# Patient Record
Sex: Male | Born: 1935
Health system: Southern US, Community
[De-identification: ages and names within clinical notes are randomized; demographics above are authoritative.]

## PROBLEM LIST (undated history)

## (undated) DIAGNOSIS — Z950 Presence of cardiac pacemaker: Secondary | ICD-10-CM

## (undated) DIAGNOSIS — I071 Rheumatic tricuspid insufficiency: Secondary | ICD-10-CM

## (undated) DIAGNOSIS — I5020 Unspecified systolic (congestive) heart failure: Secondary | ICD-10-CM

## (undated) DIAGNOSIS — I4729 Other ventricular tachycardia: Secondary | ICD-10-CM

## (undated) DIAGNOSIS — G4733 Obstructive sleep apnea (adult) (pediatric): Secondary | ICD-10-CM

## (undated) DIAGNOSIS — G7000 Myasthenia gravis without (acute) exacerbation: Secondary | ICD-10-CM

## (undated) DIAGNOSIS — D509 Iron deficiency anemia, unspecified: Secondary | ICD-10-CM

## (undated) DIAGNOSIS — C439 Malignant melanoma of skin, unspecified: Secondary | ICD-10-CM

## (undated) DIAGNOSIS — Z87442 Personal history of urinary calculi: Secondary | ICD-10-CM

## (undated) DIAGNOSIS — I351 Nonrheumatic aortic (valve) insufficiency: Secondary | ICD-10-CM

## (undated) DIAGNOSIS — I77810 Thoracic aortic ectasia: Secondary | ICD-10-CM

## (undated) DIAGNOSIS — Z8679 Personal history of other diseases of the circulatory system: Secondary | ICD-10-CM

## (undated) DIAGNOSIS — E785 Hyperlipidemia, unspecified: Secondary | ICD-10-CM

## (undated) DIAGNOSIS — I447 Left bundle-branch block, unspecified: Secondary | ICD-10-CM

## (undated) DIAGNOSIS — E119 Type 2 diabetes mellitus without complications: Secondary | ICD-10-CM

## (undated) DIAGNOSIS — I10 Essential (primary) hypertension: Secondary | ICD-10-CM

## (undated) DIAGNOSIS — K922 Gastrointestinal hemorrhage, unspecified: Secondary | ICD-10-CM

## (undated) DIAGNOSIS — C801 Malignant (primary) neoplasm, unspecified: Secondary | ICD-10-CM

## (undated) DIAGNOSIS — M4800 Spinal stenosis, site unspecified: Secondary | ICD-10-CM

## (undated) DIAGNOSIS — R2689 Other abnormalities of gait and mobility: Secondary | ICD-10-CM

## (undated) DIAGNOSIS — I2510 Atherosclerotic heart disease of native coronary artery without angina pectoris: Secondary | ICD-10-CM

## (undated) DIAGNOSIS — I472 Ventricular tachycardia: Secondary | ICD-10-CM

## (undated) HISTORY — PX: TONSILLECTOMY: SUR1361

## (undated) HISTORY — DX: Hyperlipidemia, unspecified: E78.5

## (undated) HISTORY — DX: Left bundle-branch block, unspecified: I44.7

## (undated) HISTORY — PX: COLONOSCOPY: SHX174

## (undated) HISTORY — DX: Malignant (primary) neoplasm, unspecified: C80.1

## (undated) HISTORY — DX: Essential (primary) hypertension: I10

## (undated) HISTORY — DX: Myasthenia gravis without (acute) exacerbation: G70.00

## (undated) HISTORY — DX: Atherosclerotic heart disease of native coronary artery without angina pectoris: I25.10

---

## 1999-01-07 ENCOUNTER — Ambulatory Visit: Admission: RE | Admit: 1999-01-07 | Discharge: 1999-01-07 | Payer: Self-pay | Admitting: Internal Medicine

## 1999-02-08 ENCOUNTER — Encounter (INDEPENDENT_AMBULATORY_CARE_PROVIDER_SITE_OTHER): Payer: Self-pay | Admitting: Specialist

## 1999-02-08 ENCOUNTER — Ambulatory Visit (HOSPITAL_COMMUNITY): Admission: RE | Admit: 1999-02-08 | Discharge: 1999-02-08 | Payer: Self-pay | Admitting: *Deleted

## 1999-03-14 ENCOUNTER — Ambulatory Visit: Admission: RE | Admit: 1999-03-14 | Discharge: 1999-03-14 | Payer: Self-pay | Admitting: Internal Medicine

## 2002-03-01 ENCOUNTER — Ambulatory Visit (HOSPITAL_COMMUNITY): Admission: RE | Admit: 2002-03-01 | Discharge: 2002-03-02 | Payer: Self-pay | Admitting: Cardiology

## 2002-03-01 HISTORY — PX: CORONARY ANGIOPLASTY WITH STENT PLACEMENT: SHX49

## 2002-07-14 ENCOUNTER — Encounter (INDEPENDENT_AMBULATORY_CARE_PROVIDER_SITE_OTHER): Payer: Self-pay | Admitting: *Deleted

## 2002-07-14 ENCOUNTER — Ambulatory Visit (HOSPITAL_COMMUNITY): Admission: RE | Admit: 2002-07-14 | Discharge: 2002-07-14 | Payer: Self-pay | Admitting: *Deleted

## 2003-05-01 ENCOUNTER — Ambulatory Visit (HOSPITAL_BASED_OUTPATIENT_CLINIC_OR_DEPARTMENT_OTHER): Admission: RE | Admit: 2003-05-01 | Discharge: 2003-05-01 | Payer: Self-pay | Admitting: Otolaryngology

## 2004-09-16 ENCOUNTER — Ambulatory Visit (HOSPITAL_COMMUNITY): Admission: RE | Admit: 2004-09-16 | Discharge: 2004-09-16 | Payer: Self-pay | Admitting: *Deleted

## 2005-03-07 ENCOUNTER — Encounter: Admission: RE | Admit: 2005-03-07 | Discharge: 2005-03-07 | Payer: Self-pay | Admitting: Internal Medicine

## 2009-04-19 ENCOUNTER — Encounter: Admission: RE | Admit: 2009-04-19 | Discharge: 2009-04-19 | Payer: Self-pay | Admitting: Internal Medicine

## 2010-08-19 ENCOUNTER — Encounter: Payer: Self-pay | Admitting: Internal Medicine

## 2010-11-27 HISTORY — PX: NM MYOCAR PERF WALL MOTION: HXRAD629

## 2010-12-05 HISTORY — PX: US ECHOCARDIOGRAPHY: HXRAD669

## 2010-12-13 NOTE — Op Note (Signed)
   NAME:  David, [name]                       ACCOUNT NO.:  [id number]   MEDICAL RECORD NO.:  [id number]                   PATIENT TYPE:  AMB   LOCATION:  ENDO                                 FACILITY:  [facility]   PHYSICIAN:  Georgiana Spinner, M.D.                 DATE OF BIRTH:  06-May-1936   DATE OF PROCEDURE:  07/14/2002  DATE OF DISCHARGE:                                 OPERATIVE REPORT   PROCEDURE:  Upper endoscopy.   INDICATIONS:  GERD.   ANESTHESIA:  Demerol 50 mg, Versed 5 mg.   DESCRIPTION OF PROCEDURE:  With the patient mildly sedated in the left  lateral decubitus position, the Olympus videoscopic endoscope was inserted  in the mouth, passed under direct vision through the esophagus, which  appeared normal, into the stomach.  Fundus, body, antrum, duodenal bulb,  second portion of the duodenum all appeared normal.  From this point the  endoscope was slowly withdrawn, taking circumferential views of the entire  duodenal mucosa until the endoscope pulled back into the stomach, placed in  retroflexion to view the stomach from below, and markedly decreased laxity  of the GE junction could was seen.  We could see up the esophagus in  retroflex view.  The endoscope was then straightened and withdrawn, taking  circumferential views of the remaining gastric and esophageal mucosa.  The  patient's vital signs and pulse oximetry remained stable.  The patient  tolerated the procedure well without apparent complications.   FINDINGS:  Incomplete wrap of the gastroesophageal junction around the  endoscope, which presumably leads to reflux; otherwise an unremarkable  examination.   PLAN:  Proceed to colonoscopy as planned.                                                Georgiana Spinner, M.D.    GMO/MEDQ  D:  07/14/2002  T:  07/15/2002  Job:  [id number]

## 2010-12-13 NOTE — Cardiovascular Report (Signed)
NAME:  David [name], David [name]                       ACCOUNT NO.:  [id number]   MEDICAL RECORD NO.:  [id number]                   PATIENT TYPE:  OIB   LOCATION:  2899                                 FACILITY:  [facility]   PHYSICIAN:  Aram Candela. Tysinger, M.D.              DATE OF BIRTH:  10/05/1935   DATE OF PROCEDURE:  03/01/2002  DATE OF DISCHARGE:                              CARDIAC CATHETERIZATION   PROCEDURES:  1. Left heart catheterization.  2. Coronary cineangiography.  3. Left internal mammary artery cineangiography.  4. Intravascular ultrasound of the left anterior descending.  5. Left ventricular cineangiography.  6. Angioplasty of primary stenting of the left anterior descending.  7. Perclose of the right femoral artery.   INDICATIONS FOR PROCEDURE:  This 75 year old male had the recent change in  his electrocardiogram to a left bundle branch block.  Follow-up  echocardiogram showed malfunction of the septal segment of his left  ventricle.  With evidence of left ventricular segmental dysfunction and  recent change in EKG with left bundle branch block, he was then scheduled  for cardiac catheterization in case of the high probability of ischemic  heart disease.   DESCRIPTION OF PROCEDURE:  After signing an informed consent, the patient  was premedicated with 50 mg of Benadryl intravenously and brought to the  cardiac catheterization lab.  His right groin was prepped and draped in a  sterile fashion and anesthetized locally with 1% lidocaine.  A 6 French  introducer sheath was inserted percutaneously into the right femoral artery.  A 6 French #4 Judkins coronary catheter was used to make injections into the  native coronary arteries.  The right coronary artery was used to make an  injection into the left subclavian artery, visualizing the left internal  mammary artery.  A 6 French pigtail catheter was used to measure pressures  in the left ventricle and aorta and to make midstream  injections into the  left ventricle and abdominal aorta.  An IVUS catheter was used with a high-  torque floppy guide wire to perform an intravascular ultrasound of the LAD.  A 6 [place] JL5 guide catheter was used for the IVUS procedure and also for  the angioplasty procedure where a high-torque floppy guide wire was used to  cross the lesion and a Cipher RX stent, size 3.0 x 18 mm, was used to  primarily stent the mid LAD lesion.  The stent was deployed with one  inflation at 20 atmospheres for 87 seconds.  At the end of the procedure,  the catheter and sheath were removed from the right femoral artery and  hemostasis was easily obtained with a Perclose closure system.   MEDICATIONS GIVEN:  1. Versed 1 mg IV.  2. Heparin a total of 6000 units IV.  3. Integrilin drip per pharmacy protocol.   HEMODYNAMIC DATA:  Left ventricular pressure 120/0-14.  Aortic pressure  121/72 with a mean of  90.  The left ventricular ejection fraction was  estimated at 50-60%.   CINEANGIOGRAPHIC FINDINGS:  Left coronary artery:  The ostium has a very  mild plaque causing a less than 20% stenosis.  The remainder of the left  main appears normal.  Left anterior descending:  The LAD has a plaque in the  middle segment, which is focal and eccentric and causes a 70-80% stenosis on  routine angiography.  The remainder of the LAD appeared normal.  The  circumflex coronary artery appeared normal.  Right coronary artery:  The  right coronary artery is a large dominant vessel supplying the posterior  descending and posterolateral circulation.  The ostium has a mild narrowing,  but this is followed by moderate ectasia and the ostium may be normal in  comparison.  The proximal segment does have moderate ectasia, which is  eccentric and causes slowing of the antegrade flow.  Otherwise the right  coronary artery appears normal without significant stenosis.  Left  ventricle:  The left ventricular chamber size is mildly  enlarged.  The  overall left ventricular contractility appears normal with an ejection  fraction of 50-60%.  The septum was not visualized in this RAO view, but  mitral and aortic valves appear normal.  Angioplasty:  Cines taken during  the IVUS and angioplasty procedures show proper positioning of the guide  wire, IVUS apparatus, and the balloon stent system.  Final injections into  the LAD post stent deployment showed an excellent angiographic result with  0% residual lesion and no evidence for dissection or clot.  There was normal  antegrade flow.   FINAL DIAGNOSES:  1. Single-vessel coronary artery disease with 80-90% stenosis of the mid     left anterior descending by intravascular ultrasound.  2. Moderate ectasia of the right coronary artery.  3. Normal left ventricular function.  4. Normal left internal mammary artery.  5. Successful primary stent placement of the mid left anterior descending.  6. Successful Perclose of the right femoral artery.   DISPOSITION:  Will monitor on the EAD and continue the Integrilin drip.  Will plan for discharge in the a.m.                                                John R. Tysinger, M.D.    JRT/MEDQ  D:  03/01/2002  T:  03/05/2002  Job:  [id number]   cc:   Jaclyn Prime. Lucas Mallow, M.D.  Fax: [phone]   Catherization Lab

## 2010-12-13 NOTE — Op Note (Signed)
NAMEMAVERYK, RENSTROM             ACCOUNT NO.:  [id number]   MEDICAL RECORD NO.:  [id number]          PATIENT TYPE:  AMB   LOCATION:  ENDO                         FACILITY:  [facility]   PHYSICIAN:  Georgiana Spinner, M.D.    DATE OF BIRTH:  [birth year]   DATE OF PROCEDURE:  09/16/2004  DATE OF DISCHARGE:                                 OPERATIVE REPORT   PROCEDURE:  Colonoscopy.   INDICATIONS:  Colon polyps.   ANESTHESIA:  Demerol 60, Versed 6 mg.   DESCRIPTION OF PROCEDURE:  With the patient mildly sedated in the left  lateral decubitus position, the Olympus videoscopic colonoscope was inserted  into the rectum after a normal rectal exam and passed under direct vision to  the cecum identified by ileocecal valve and appendiceal orifice both of  which were photographed.  From this point, the colonoscope was slowly  withdrawn taking circumferential views of colonic mucosa stopping only in  the rectum which appeared normal in direct view and showed hemorrhoids on  retroflexed view and a fissure through the anal canal. The patient's vital  signs and pulse oximeter remained stable. The patient tolerated procedure  well without apparent complications.   FINDINGS:  Internal hemorrhoids and fissure, otherwise unremarkable  examination.   PLAN:  Repeat examination possibly in 5 years      GMO/MEDQ  D:  09/16/2004  T:  09/16/2004  Job:  [id number]

## 2010-12-13 NOTE — Op Note (Signed)
   NAME:  David [name], David [name]                       ACCOUNT NO.:  [id number]   MEDICAL RECORD NO.:  [id number]                   PATIENT TYPE:  AMB   LOCATION:  ENDO                                 FACILITY:  [facility]   PHYSICIAN:  Georgiana Spinner, M.D.                 DATE OF BIRTH:  1935/09/26   DATE OF PROCEDURE:  07/14/2002  DATE OF DISCHARGE:                                 OPERATIVE REPORT   PROCEDURE:  Colonoscopy with polypectomy.   INDICATIONS:  Colon polyps.   ANESTHESIA:  Demerol 20 mg, Versed 2 mg.   DESCRIPTION OF PROCEDURE:  With the patient mildly sedated in the left  lateral decubitus position, the Olympus videoscopic colonoscope was inserted  in the rectum after a normal rectal exam, passed under direct vision to the  cecum, identified by ileocecal valve and appendiceal orifice, both of which  were photographed.  From this point the colonoscope was slowly withdrawn,  taking circumferential views of the entire colonic mucosa as we pulled back  all the way to the rectum, stopping at 25 cm from the anal verge, at which  point a small pedunculated polyp was seen, photographed, and removed using  snare cautery technique, setting of 20-20 blended current.  Tissue was  retrieved for pathology and the endoscope was withdrawn as noted, and  retroflexed view showed hemorrhoids.  The endoscope was straightened and  withdrawn.  The patient's vital signs and pulse oximetry remained stable.  The patient tolerated the procedure well without apparent complications.   FINDINGS:  1. Internal hemorrhoids.  2. Polyp at 25 cm.   PLAN:  Await biopsy report.  The patient will call me for results and follow  up with me as an outpatient.                                               Georgiana Spinner, M.D.    GMO/MEDQ  D:  07/14/2002  T:  07/15/2002  Job:  [id number]

## 2011-08-05 ENCOUNTER — Other Ambulatory Visit: Payer: Self-pay | Admitting: Dermatology

## 2011-12-09 ENCOUNTER — Other Ambulatory Visit: Payer: Self-pay | Admitting: Dermatology

## 2012-02-19 ENCOUNTER — Other Ambulatory Visit: Payer: Self-pay | Admitting: Dermatology

## 2012-03-25 ENCOUNTER — Ambulatory Visit (INDEPENDENT_AMBULATORY_CARE_PROVIDER_SITE_OTHER): Payer: Medicare Other | Admitting: General Surgery

## 2012-03-25 ENCOUNTER — Encounter (INDEPENDENT_AMBULATORY_CARE_PROVIDER_SITE_OTHER): Payer: Self-pay | Admitting: General Surgery

## 2012-03-25 VITALS — BP 136/74 | HR 92 | Temp 98.0°F | Resp 16 | Ht 69.0 in | Wt 212.2 lb

## 2012-03-25 DIAGNOSIS — N5089 Other specified disorders of the male genital organs: Secondary | ICD-10-CM

## 2012-03-25 DIAGNOSIS — N508 Other specified disorders of male genital organs: Secondary | ICD-10-CM

## 2012-03-25 NOTE — Patient Instructions (Signed)
You have a tender left spermatic cord mass and need to see Dr. Isabel Caprice.

## 2012-03-25 NOTE — Progress Notes (Signed)
Patient ID: David [name], male   DOB: 03-30-1936, 76 y.o.   MRN: [id number]  Chief Complaint  Patient presents with  . Pre-op Exam    eval ing hernia    HPI David [name] is a 76 y.o. male.   HPI   He is referred by Dr. Renne Crigler for evaluation of a left inguinal hernia.  He has had sporadic discomfort and a small swelling in the left scrotal area off and on for many months. It is not activity related. He says it will swell up and be sure for 2 weeks then then will resolve. No dysuria. It does not interfere with his bowel or bladder habits.  No family history of inguinal hernia.  Past Medical History  Diagnosis Date  . Hyperlipidemia   . Cancer     skin    Sleep apnea   HTN  Past Surgical History  Procedure Date  . Tonsillectomy     Family History  Problem Relation Age of Onset  . Cancer Mother     breast  . Cancer Father     colon    Social History History  Substance Use Topics  . Smoking status: Former Games developer  . Smokeless tobacco: Never Used  . Alcohol Use: Yes     1-2 drink per week    Allergies  Allergen Reactions  . Gris-Peg (Griseofulvin) Other (See Comments)    headaches    Current Outpatient Prescriptions  Medication Sig Dispense Refill  . alfuzosin (UROXATRAL) 10 MG 24 hr tablet Take 10 mg by mouth daily.      Marland Kitchen aspirin EC 81 MG tablet Take 81 mg by mouth daily.      Marland Kitchen ezetimibe-simvastatin (VYTORIN) 10-40 MG per tablet Take 1 tablet by mouth at bedtime.      . fish oil-omega-3 fatty acids 1000 MG capsule Take 2 g by mouth daily.      Marland Kitchen glucosamine-chondroitin 500-400 MG tablet Take 1 tablet by mouth 3 (three) times daily.      . Multiple Vitamins-Minerals (MULTIVITAMIN WITH MINERALS) tablet Take 1 tablet by mouth daily.      . vitamin C (ASCORBIC ACID) 250 MG tablet Take 250 mg by mouth daily.        Review of Systems Review of Systems  Respiratory: Negative.   Cardiovascular: Negative.   Gastrointestinal: Negative.   Genitourinary:  Positive for scrotal swelling. Negative for dysuria and difficulty urinating.  Hematological: Bruises/bleeds easily.    Blood pressure 136/74, pulse 92, temperature 98 F (36.7 C), temperature source Temporal, resp. rate 16, height 5\' 9"  (1.753 m), weight 212 lb 3.2 oz (96.253 kg).  Physical Exam Physical Exam  Constitutional:       Overweight male in NAD  HENT:  Head: Normocephalic and atraumatic.  Abdominal: Soft.       No umbilical hernia.  Genitourinary:       No testicular masses.  Tender mass on left spermatic cord.  No hernias.    Data Reviewed Dr. Carolee Rota note.  Assessment    Tender left spermatic cord mass, ? Varicocele.  No inguinal hernia found.    Plan    Referral to Dr. Isabel Caprice, his Urologist, for further evaluation.       David [name] 03/25/2012, 2:02 PM

## 2012-03-25 NOTE — Addendum Note (Signed)
Addended by: Milas Hock on: 03/25/2012 03:24 PM   Modules accepted: Orders

## 2012-06-10 ENCOUNTER — Other Ambulatory Visit: Payer: Self-pay | Admitting: Dermatology

## 2013-09-26 ENCOUNTER — Ambulatory Visit: Payer: Self-pay | Admitting: Cardiovascular Disease

## 2013-10-05 ENCOUNTER — Telehealth (HOSPITAL_COMMUNITY): Payer: Self-pay | Admitting: *Deleted

## 2013-10-13 ENCOUNTER — Encounter: Payer: Self-pay | Admitting: Cardiovascular Disease

## 2013-10-26 ENCOUNTER — Ambulatory Visit (INDEPENDENT_AMBULATORY_CARE_PROVIDER_SITE_OTHER): Payer: Medicare Other | Admitting: Cardiovascular Disease

## 2013-10-26 ENCOUNTER — Encounter: Payer: Self-pay | Admitting: Cardiovascular Disease

## 2013-10-26 VITALS — BP 122/70 | HR 78 | Ht 70.0 in | Wt 223.3 lb

## 2013-10-26 DIAGNOSIS — I2510 Atherosclerotic heart disease of native coronary artery without angina pectoris: Secondary | ICD-10-CM

## 2013-10-26 DIAGNOSIS — I10 Essential (primary) hypertension: Secondary | ICD-10-CM | POA: Insufficient documentation

## 2013-10-26 DIAGNOSIS — E785 Hyperlipidemia, unspecified: Secondary | ICD-10-CM | POA: Insufficient documentation

## 2013-10-26 DIAGNOSIS — E669 Obesity, unspecified: Secondary | ICD-10-CM | POA: Insufficient documentation

## 2013-10-26 MED ORDER — NITROGLYCERIN 0.4 MG/SPRAY TL SOLN: 1.0000 | Status: DC | PRN

## 2013-10-26 NOTE — Patient Instructions (Signed)
Your physician has requested that you have a lexiscan myoview. For further information please visit [email or web address]. Please follow instruction sheet, as given.  This will be scheduled in 1 year with a office visit to follow.

## 2013-10-26 NOTE — Progress Notes (Signed)
Patient ID: David [name], male   DOB: 11/24/1935, 78 y.o.   MRN: [id number]     HPI: David [name] is a 78 y.o. male who presents for one year cardiology evaluation.  David [name] has known coronary artery disease and in 2003 underwent stenting of his LAD by Dr. Glade Lloyd. Additional problems include hypertension, hyperlipidemia, and chronic left bundle branch block. His last echo Doppler study in May 2012 showed mild concentric LVH with an ejection fraction of 50-55%. There was grade 1 diastolic dysfunction the mitral valve E/A ratio of 0.62. There was mild aortic sclerosis with mild aortic insufficiency, mild mitral annular calcification with trace MR, mild TR, mild left atrial dilatation. His last nuclear perfusion study in May 2012 showed mild diaphragmatic attenuation without ischemia.  Over the past year, David [name] has continued to remain fairly stable from a cardiac standpoint. He is tolerating his Vytorin 10/40. He recently had laboratory done at Dr. Reginia Naas office. His blood sugar was elevated at 142. Transaminases were normal. Normal renal function. Lipids were excellent with a total cholesterol of 117 triglycerides 99 HDL 43 VLDL 20 LDL 54.  He does do gardening. He does not routinely exercise. A chest pain or shortness of breath.  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  Past Medical History  Diagnosis Date  . Hyperlipidemia   . Cancer     skin    Past Surgical History  Procedure Laterality Date  . Tonsillectomy      Allergies  Allergen Reactions  . Gris-Peg [Griseofulvin] Other (See Comments)    headaches    Current Outpatient Prescriptions  Medication Sig Dispense Refill  . alfuzosin (UROXATRAL) 10 MG 24 hr tablet Take 10 mg by mouth daily.      Marland Kitchen aspirin EC 81 MG tablet Take 81 mg by mouth 2 (two) times daily.       Marland Kitchen ezetimibe-simvastatin (VYTORIN) 10-40 MG per tablet Take 1 tablet by mouth  at bedtime.      . fish oil-omega-3 fatty acids 1000 MG capsule Take 2 g by mouth daily.      . fluticasone (VERAMYST) 27.5 MCG/SPRAY nasal spray Place 2 sprays into the nose daily.      Marland Kitchen glucosamine-chondroitin 500-400 MG tablet Take 2 tablets by mouth daily.       . Multiple Vitamins-Minerals (MULTIVITAMIN WITH MINERALS) tablet Take 1 tablet by mouth daily.      Marland Kitchen NITROSTAT 0.4 MG SL tablet Place 1 tablet under the tongue as needed.      . vitamin C (ASCORBIC ACID) 250 MG tablet Take 250 mg by mouth daily.       No current facility-administered medications for this visit.    History   Social History  . Marital Status: Married    Spouse Name: N/A    Number of Children: N/A  . Years of Education: N/A   Occupational History  . Not on file.   Social History Main Topics  . Smoking status: Former Smoker    Types: Cigarettes, Cigars    Quit date: 10/27/1982  . Smokeless tobacco: Never Used  . Alcohol Use: Yes     Comment: 1-2 drink per week  . Drug Use: No  . Sexual Activity: Not on file   Other Topics Concern  . Not on file   Social History Narrative  . No narrative on file    Family History  Problem Relation Age of Onset  .  Cancer Mother     breast  . Cancer Father     colon    ROS is negative for fevers, chills or night sweats.   He denies change in weight. There are no rashes. He denies visual symptoms. There are no hearing difficulties. He denies wheezing. There is no cough or increased sputum production. He is unaware of lymphadenopathy. There is no exertionally precipitated chest pain. He denies presyncope or syncope. He denies nausea vomiting or diarrhea. He is unaware of blood in stool or urine. There is no claudication. He is unaware of leg swelling. He denies diabetes. He is unaware overheating Other comprehensive 14 point system review is negative.  PE BP 122/70  Pulse 78  Ht 5\' 10"  (1.778 m)  Wt 223 lb 4.8 oz (101.288 kg)  BMI 32.04 kg/m2  General:  Alert, oriented, no distress.  Skin: normal turgor, no rashes HEENT: Normocephalic, atraumatic. Pupils round and reactive; sclera anicteric;no lid lag. Extraocular muscles intact;; no xanthelasmas. Nose without nasal septal hypertrophy Mouth/Parynx benign; Mallinpatti scale 2 Neck: No JVD, no carotid bruits; normal carotid upstroke Lungs: clear to ausculatation and percussion; no wheezing or rales Chest wall: no tenderness to palpitation Heart: RRR, s1 s2 normal; 1/6 systolic murmur no diastolic murmur, rub thrills or heaves Abdomen: Mild central adiposity soft, nontender; no hepatosplenomehaly, BS+; abdominal aorta nontender and not dilated by palpation. Back: no CVA tenderness Pulses 2+ Extremities: no clubbing cyanosis or edema, Homan's sign negative  Neurologic: grossly nonfocal; cranial nerves grossly normal. Psychologic: normal affect and mood.  ECG (independently read by me): Normal sinus rhythm at 78 beats per minute. Left bundle branch block.  LABS:  BMET No results found for this basename: na, k, cl, co2, glucose, bun, creatinine, calcium, gfrnonaa, gfraa     Hepatic Function Panel  No results found for this basename: prot, albumin, ast, alt, alkphos, bilitot, bilidir, ibili     CBC No results found for this basename: wbc, rbc, hgb, hct, plt, mcv, mch, mchc, rdw, neutrabs, lymphsabs, monoabs, eosabs, basosabs     BNP No results found for this basename: probnp    Lipid Panel  No results found for this basename: chol, trig, hdl, cholhdl, vldl, ldlcalc     RADIOLOGY: No results found.    ASSESSMENT AND PLAN:  David [name] is a 78 year old gentleman who is now 12 years status post stenting of his LAD by Dr. Glade Lloyd. His blood pressure is well-controlled today on Maxide. His lipids are excellent on Vytorin 10/40. He's not having any anginal symptoms. He continues to have difficulty with the sublingual nitroglycerin pills disintegrating. I will change him  to a nitroglycerin spray to take on a when necessary basis. We discussed weight loss. He does have mild obesity. He will try to increase his activity as tolerated. His last nuclear perfusion study was in May 2012. I will see him in one year for followup Cardiologic evaluation and prior to that office visit he will have a 4 year followup nuclear perfusion study. I have recommended he decrease his aspirin to just 81 mg daily rather than twice a day. I will see him in one year for cardiology evaluation.    [name], MD, [facility]  10/26/2013 10:17 AM

## 2013-10-27 ENCOUNTER — Encounter: Payer: Self-pay | Admitting: *Deleted

## 2013-10-31 ENCOUNTER — Encounter: Payer: Self-pay | Admitting: *Deleted

## 2014-02-21 ENCOUNTER — Encounter: Payer: Self-pay | Admitting: Internal Medicine

## 2014-03-16 ENCOUNTER — Other Ambulatory Visit: Payer: Self-pay | Admitting: Dermatology

## 2014-10-18 ENCOUNTER — Telehealth (HOSPITAL_COMMUNITY): Payer: Self-pay | Admitting: Cardiovascular Disease

## 2014-10-18 NOTE — Telephone Encounter (Signed)
Received records from [facility] for appointment with Dr Claiborne Billings on 11/01/14.  Records given to [facility] (medical records) for Dr Evette Georges schedule on 11/01/14.  lp

## 2014-10-19 ENCOUNTER — Encounter: Payer: Self-pay | Admitting: *Deleted

## 2014-10-19 ENCOUNTER — Telehealth (HOSPITAL_COMMUNITY): Payer: Self-pay

## 2014-10-19 ENCOUNTER — Encounter: Payer: Self-pay | Admitting: Cardiovascular Disease

## 2014-10-19 NOTE — Telephone Encounter (Signed)
Encounter complete. 

## 2014-10-24 ENCOUNTER — Inpatient Hospital Stay (HOSPITAL_COMMUNITY)
Admission: AD | Admit: 2014-10-24 | Discharge: 2014-10-25 | DRG: 244 | Disposition: A | Discharge status: Home / Self Care | Payer: Medicare Other | Source: Ambulatory Visit | Attending: Cardiology | Admitting: Cardiology

## 2014-10-24 ENCOUNTER — Encounter (HOSPITAL_COMMUNITY): Payer: Self-pay | Admitting: Cardiology

## 2014-10-24 ENCOUNTER — Inpatient Hospital Stay (HOSPITAL_COMMUNITY): Payer: Medicare Other

## 2014-10-24 ENCOUNTER — Encounter (HOSPITAL_COMMUNITY)
Admission: AD | Disposition: A | Discharge status: Home / Self Care | Payer: Self-pay | Source: Ambulatory Visit | Attending: Cardiology

## 2014-10-24 ENCOUNTER — Ambulatory Visit (INDEPENDENT_AMBULATORY_CARE_PROVIDER_SITE_OTHER): Payer: Medicare Other | Admitting: Cardiology

## 2014-10-24 ENCOUNTER — Ambulatory Visit (HOSPITAL_BASED_OUTPATIENT_CLINIC_OR_DEPARTMENT_OTHER)
Admission: RE | Admit: 2014-10-24 | Discharge: 2014-10-24 | Disposition: A | Discharge status: Home / Self Care | Payer: Medicare Other | Source: Ambulatory Visit | Attending: Cardiovascular Disease | Admitting: Cardiovascular Disease

## 2014-10-24 ENCOUNTER — Encounter: Payer: Self-pay | Admitting: Cardiology

## 2014-10-24 VITALS — BP 140/62 | HR 48 | Ht 69.0 in | Wt 229.4 lb

## 2014-10-24 VITALS — Ht 70.0 in | Wt 223.0 lb

## 2014-10-24 DIAGNOSIS — E876 Hypokalemia: Secondary | ICD-10-CM | POA: Diagnosis present

## 2014-10-24 DIAGNOSIS — I2510 Atherosclerotic heart disease of native coronary artery without angina pectoris: Secondary | ICD-10-CM

## 2014-10-24 DIAGNOSIS — I442 Atrioventricular block, complete: Secondary | ICD-10-CM | POA: Diagnosis not present

## 2014-10-24 DIAGNOSIS — Z955 Presence of coronary angioplasty implant and graft: Secondary | ICD-10-CM | POA: Diagnosis not present

## 2014-10-24 DIAGNOSIS — I351 Nonrheumatic aortic (valve) insufficiency: Secondary | ICD-10-CM | POA: Diagnosis present

## 2014-10-24 DIAGNOSIS — I10 Essential (primary) hypertension: Secondary | ICD-10-CM | POA: Diagnosis not present

## 2014-10-24 DIAGNOSIS — Z7982 Long term (current) use of aspirin: Secondary | ICD-10-CM

## 2014-10-24 DIAGNOSIS — I370 Nonrheumatic pulmonary valve stenosis: Secondary | ICD-10-CM

## 2014-10-24 DIAGNOSIS — E785 Hyperlipidemia, unspecified: Secondary | ICD-10-CM

## 2014-10-24 DIAGNOSIS — Z87891 Personal history of nicotine dependence: Secondary | ICD-10-CM | POA: Diagnosis not present

## 2014-10-24 DIAGNOSIS — I447 Left bundle-branch block, unspecified: Secondary | ICD-10-CM | POA: Diagnosis not present

## 2014-10-24 DIAGNOSIS — I2583 Coronary atherosclerosis due to lipid rich plaque: Secondary | ICD-10-CM

## 2014-10-24 DIAGNOSIS — Z79899 Other long term (current) drug therapy: Secondary | ICD-10-CM

## 2014-10-24 DIAGNOSIS — Z950 Presence of cardiac pacemaker: Secondary | ICD-10-CM | POA: Diagnosis not present

## 2014-10-24 DIAGNOSIS — Z959 Presence of cardiac and vascular implant and graft, unspecified: Secondary | ICD-10-CM

## 2014-10-24 HISTORY — PX: PERMANENT PACEMAKER INSERTION: SHX5480

## 2014-10-24 HISTORY — DX: Presence of cardiac pacemaker: Z95.0

## 2014-10-24 LAB — TSH: TSH: 1.402 u[IU]/mL (ref 0.350–4.500)

## 2014-10-24 LAB — COMPREHENSIVE METABOLIC PANEL
ALK PHOS: 54 U/L (ref 39–117)
ALT: 23 U/L (ref 0–53)
AST: 25 U/L (ref 0–37)
Albumin: 3.7 g/dL (ref 3.5–5.2)
Anion gap: 4 — ABNORMAL LOW (ref 5–15)
BUN: 16 mg/dL (ref 6–23)
CALCIUM: 8.5 mg/dL (ref 8.4–10.5)
CHLORIDE: 104 mmol/L (ref 96–112)
CO2: 30 mmol/L (ref 19–32)
Creatinine, Ser: 0.8 mg/dL (ref 0.50–1.35)
GFR calc Af Amer: >90 mL/min (ref >90)
GFR, EST NON AFRICAN AMERICAN: 84 mL/min — AB (ref >90)
Glucose, Bld: 104 mg/dL — ABNORMAL HIGH (ref 70–99)
POTASSIUM: 3.4 mmol/L — AB (ref 3.5–5.1)
Sodium: 138 mmol/L (ref 135–145)
Total Bilirubin: 1.7 mg/dL — ABNORMAL HIGH (ref 0.3–1.2)
Total Protein: 6.4 g/dL (ref 6.0–8.3)

## 2014-10-24 LAB — CBC WITH DIFFERENTIAL/PLATELET
BASOS PCT: 0 % (ref 0–1)
Basophils Absolute: 0 10*3/uL (ref 0.0–0.1)
EOS PCT: 2 % (ref 0–5)
Eosinophils Absolute: 0.2 10*3/uL (ref 0.0–0.7)
HCT: 44.5 % (ref 39.0–52.0)
HEMOGLOBIN: 15 g/dL (ref 13.0–17.0)
Lymphocytes Relative: 32 % (ref 12–46)
Lymphs Abs: 2.1 10*3/uL (ref 0.7–4.0)
MCH: 30.8 pg (ref 26.0–34.0)
MCHC: 33.7 g/dL (ref 30.0–36.0)
MCV: 91.4 fL (ref 78.0–100.0)
MONO ABS: 0.5 10*3/uL (ref 0.1–1.0)
MONOS PCT: 8 % (ref 3–12)
NEUTROS ABS: 3.8 10*3/uL (ref 1.7–7.7)
NEUTROS PCT: 58 % (ref 43–77)
Platelets: 194 10*3/uL (ref 150–400)
RBC: 4.87 MIL/uL (ref 4.22–5.81)
RDW: 13.6 % (ref 11.5–15.5)
WBC: 6.6 10*3/uL (ref 4.0–10.5)

## 2014-10-24 LAB — MAGNESIUM: MAGNESIUM: 1.9 mg/dL (ref 1.5–2.5)

## 2014-10-24 LAB — APTT: APTT: 31 s (ref 24–37)

## 2014-10-24 LAB — TROPONIN I: Troponin I: <0.03 ng/mL (ref <0.031)

## 2014-10-24 LAB — PROTIME-INR
INR: 1.06 (ref 0.00–1.49)
PROTHROMBIN TIME: 13.9 s (ref 11.6–15.2)

## 2014-10-24 SURGERY — PERMANENT PACEMAKER INSERTION
Anesthesia: LOCAL

## 2014-10-24 SURGERY — PERMANENT PACEMAKER INSERTION

## 2014-10-24 MED ORDER — SODIUM CHLORIDE 0.9 % IV SOLN
INTRAVENOUS | Status: DC
Start: 2014-10-24 — End: 2014-10-24

## 2014-10-24 MED ORDER — NITROGLYCERIN 0.4 MG/SPRAY TL SOLN
1.0000 | Status: DC | PRN
  Filled 2014-10-24: qty 4.9

## 2014-10-24 MED ORDER — ASPIRIN EC 81 MG PO TBEC
81.0000 mg | DELAYED_RELEASE_TABLET | Freq: Every day | ORAL | Status: DC
  Administered 2014-10-25: 81 mg via ORAL
  Filled 2014-10-24: qty 1

## 2014-10-24 MED ORDER — ONDANSETRON HCL 4 MG PO TABS: 4.0000 mg | ORAL_TABLET | Freq: Four times a day (QID) | ORAL | Status: DC | PRN

## 2014-10-24 MED ORDER — ONDANSETRON HCL 4 MG/2ML IJ SOLN: 4.0000 mg | Freq: Four times a day (QID) | INTRAMUSCULAR | Status: DC | PRN

## 2014-10-24 MED ORDER — ACETAMINOPHEN 325 MG PO TABS
325.0000 mg | ORAL_TABLET | ORAL | Status: DC | PRN
  Administered 2014-10-24: 650 mg via ORAL
  Filled 2014-10-24: qty 2

## 2014-10-24 MED ORDER — POLYETHYLENE GLYCOL 3350 17 G PO PACK: 17.0000 g | PACK | Freq: Every day | ORAL | Status: DC | PRN

## 2014-10-24 MED ORDER — CHLORHEXIDINE GLUCONATE 4 % EX LIQD
60.0000 mL | Freq: Once | CUTANEOUS | Status: DC
  Filled 2014-10-24: qty 15

## 2014-10-24 MED ORDER — TECHNETIUM TC 99M SESTAMIBI GENERIC - CARDIOLITE
10.9000 | Freq: Once | INTRAVENOUS | Status: AC | PRN
  Administered 2014-10-24: 10.9 via INTRAVENOUS

## 2014-10-24 MED ORDER — CEFAZOLIN SODIUM-DEXTROSE 2-3 GM-% IV SOLR
2.0000 g | INTRAVENOUS | Status: DC
  Filled 2014-10-24: qty 50

## 2014-10-24 MED ORDER — LIDOCAINE HCL (PF) 1 % IJ SOLN
INTRAMUSCULAR | Status: AC
  Filled 2014-10-24: qty 60

## 2014-10-24 MED ORDER — SODIUM CHLORIDE 0.9 % IJ SOLN: 3.0000 mL | Freq: Two times a day (BID) | INTRAMUSCULAR | Status: DC

## 2014-10-24 MED ORDER — HYDROCODONE-ACETAMINOPHEN 5-325 MG PO TABS
1.0000 | ORAL_TABLET | ORAL | Status: DC | PRN
Start: 2014-10-24 — End: 2014-10-25
  Administered 2014-10-25 (×2): 1 via ORAL
  Filled 2014-10-24: qty 2
  Filled 2014-10-24: qty 1

## 2014-10-24 MED ORDER — ZOLPIDEM TARTRATE 5 MG PO TABS: 5.0000 mg | ORAL_TABLET | Freq: Every evening | ORAL | Status: DC | PRN

## 2014-10-24 MED ORDER — TRIAMTERENE-HCTZ 37.5-25 MG PO TABS
1.0000 | ORAL_TABLET | Freq: Every day | ORAL | Status: DC
  Administered 2014-10-25: 1 via ORAL
  Filled 2014-10-24 (×2): qty 1

## 2014-10-24 MED ORDER — SODIUM CHLORIDE 0.9 % IV SOLN
INTRAVENOUS | Status: DC
  Administered 2014-10-24: 50 mL/h via INTRAVENOUS

## 2014-10-24 MED ORDER — CEFAZOLIN SODIUM 1-5 GM-% IV SOLN
1.0000 g | Freq: Four times a day (QID) | INTRAVENOUS | Status: AC
  Administered 2014-10-24 – 2014-10-25 (×3): 1 g via INTRAVENOUS
  Filled 2014-10-24 (×4): qty 50

## 2014-10-24 MED ORDER — YOU HAVE A PACEMAKER BOOK
Freq: Once | Status: AC
  Administered 2014-10-25: 04:00:00
  Filled 2014-10-24: qty 1

## 2014-10-24 MED ORDER — SODIUM CHLORIDE 0.9 % IJ SOLN: 3.0000 mL | INTRAMUSCULAR | Status: DC | PRN

## 2014-10-24 MED ORDER — SODIUM CHLORIDE 0.9 % IV SOLN: INTRAVENOUS | Status: DC

## 2014-10-24 MED ORDER — TAMSULOSIN HCL 0.4 MG PO CAPS
0.4000 mg | ORAL_CAPSULE | Freq: Every day | ORAL | Status: DC
  Administered 2014-10-25: 0.4 mg via ORAL
  Filled 2014-10-24: qty 1

## 2014-10-24 MED ORDER — EZETIMIBE-SIMVASTATIN 10-40 MG PO TABS
1.0000 | ORAL_TABLET | Freq: Every day | ORAL | Status: DC
  Administered 2014-10-24: 1 via ORAL
  Filled 2014-10-24 (×2): qty 1

## 2014-10-24 MED ORDER — SODIUM CHLORIDE 0.9 % IV SOLN: 250.0000 mL | INTRAVENOUS | Status: DC | PRN

## 2014-10-24 MED ORDER — HEPARIN (PORCINE) IN NACL 2-0.9 UNIT/ML-% IJ SOLN
INTRAMUSCULAR | Status: AC
  Filled 2014-10-24: qty 500

## 2014-10-24 MED ORDER — SODIUM CHLORIDE 0.9 % IR SOLN
80.0000 mg | Status: DC
  Filled 2014-10-24 (×2): qty 2

## 2014-10-24 NOTE — H&P (View-Only) (Signed)
Reason for Consult: CHB   Referring Physician: Dr. Stanford Breed   PCP:  Horatio Pel, MD  Primary Cardiologist:Dr. Tracie Harrier Fukuda is an 79 y.o. male.    Chief Complaint: found in CHB when presented for Nuc study, did have increased fatigue on Sat presumed due to CHB    HPI: 79 year old WM with hx CAD with stent to LAD in 2003.  He has HTN, hyperlipidemia and chronic LBBB.   His last echo Doppler study in May 2012 showed mild concentric LVH with an ejection fraction of 50-55%. There was grade 1 diastolic dysfunction the mitral valve E/A ratio of 0.62. There was mild aortic sclerosis with mild aortic insufficiency, mild mitral annular calcification with trace MR, mild TR, mild left atrial dilatation. His last nuclear perfusion study in May 2012 showed mild diaphragmatic attenuation without ischemia. Last year he was seen by Dr. Claiborne Billings and arranged for 4 year follow up nuc study to be done this year prior to visit.  He has chronic LBBB.   Today on presentation for Nuc study he was in CHB and felt well though on Sat. He felt weak and fatigued.  Nuc study was continued as he had no significant symptoms.   Awaiting results. He was then transferred to [facility] for echo, labs and probable PPM.  On arrival he feels well.  Denies any chest pain.  No SOB.  He has been NPO.    Past Medical History  Diagnosis Date  . Hyperlipidemia   . Cancer     skin  . CAD (coronary artery disease)     previous stent to LAD 2003  . LBBB (left bundle branch block)   . Hypertension     Past Surgical History  Procedure Laterality Date  . Tonsillectomy    . Coronary angioplasty with stent placement  03/01/2002    stenting to LAD - Dr. Glade Lloyd  . US echocardiography  12/05/2010    proximal septal thickening,mild concentric LVH,mild deptal hypokinesis,RV mildly dilated,LA mildly dilated,mild AI,moderate aortic root dilatation,septal motion c/w conduction abnormality  . Nm myocar perf wall  motion  11/27/2010    normal    Family History  Problem Relation Age of Onset  . Cancer Mother     breast  . Cancer Father     colon/lung   Social History:  reports that he quit smoking about 32 years ago. His smoking use included Cigarettes and Cigars. He has never used smokeless tobacco. He reports that he drinks alcohol. He reports that he does not use illicit drugs.  Allergies:  Allergies  Allergen Reactions  . Gris-Peg [Griseofulvin] Other (See Comments)    headaches    Medications Prior to Admission  Medication Sig Dispense Refill  . aspirin EC 81 MG tablet Take 81 mg by mouth daily.     Marland Kitchen ezetimibe-simvastatin (VYTORIN) 10-40 MG per tablet Take 1 tablet by mouth at bedtime.    . fish oil-omega-3 fatty acids 1000 MG capsule Take 2 g by mouth daily.    Marland Kitchen glucosamine-chondroitin 500-400 MG tablet Take 2 tablets by mouth daily.     . Meth-Hyo-M Bl-Na Phos-Ph Sal (URIBEL) 118 MG CAPS Take 1 capsule by mouth as needed.    . Multiple Vitamins-Minerals (MULTIVITAMIN WITH MINERALS) tablet Take 1 tablet by mouth daily.    . nitroGLYCERIN (NITROLINGUAL) 0.4 MG/SPRAY spray Place 1 spray under the tongue every 5 (five) minutes x 3 doses as needed for  chest pain. 12 g 12  . tamsulosin (FLOMAX) 0.4 MG CAPS capsule Take 0.4 mg by mouth daily.    Marland Kitchen triamterene-hydrochlorothiazide (MAXZIDE-25) 37.5-25 MG per tablet Take 1 tablet by mouth daily.    . vitamin C (ASCORBIC ACID) 250 MG tablet Take 250 mg by mouth daily.      No results found for this or any previous visit (from the past 48 hour(s)). No results found.  ROS: General:no colds or fevers, no weight changes, weakness on Sat. With exertion Skin:no rashes or ulcers HEENT:no blurred vision, no congestion CV:see HPI PUL:see HPI GI:no diarrhea constipation or melena, no indigestion GU:no hematuria, no dysuria MS:no joint pain, no claudication Neuro:no syncope, no lightheadedness Endo:no diabetes, no thyroid disease   Blood  pressure 151/89, pulse 45, temperature 98.3 F (36.8 C), temperature source Oral, resp. rate 18, height 5\' 9"  (1.753 m), weight 224 lb 8 oz (101.833 kg), SpO2 97 %.  Wt Readings from Last 3 Encounters:  10/24/14 224 lb 8 oz (101.833 kg)  10/24/14 229 lb 6.4 oz (104.055 kg)  10/26/13 223 lb 4.8 oz (101.288 kg)    PE: General:Pleasant affect, NAD Skin:Warm and dry, brisk capillary refill HEENT:normocephalic, sclera clear, mucus membranes moist Neck:supple, no JVD, no bruits  Heart:S1S2 RRR slow, without murmur, gallup, rub or click Lungs:clear without rales, rhonchi, or wheezes KYH:CWCB, non tender, + BS, do not palpate liver spleen or masses Ext:no lower ext edema, 2+ pedal pulses, 2+ radial pulses Neuro:alert and oriented X 3, MAE, follows commands, + facial symmetry    Assessment/Plan Principal Problem:   CHB (complete heart block)- currently with minimal activity stable with BP 151/89- have ordered stat echo, labs - have not yet discussed PPM with pt.  Dr. Rayann Heman to see for probable PPM   Active Problems:   LAD stent 2003- awaiting nuc results   HTN (hypertension)- elevated with his bradycardia   Hyperlipidemia with target LDL less than 70    [facility] R  Nurse Practitioner Certified [place] Pager [phone] or after 5pm or weekends call [phone] 10/24/2014, 1:56 PM  I have seen, examined the patient, and reviewed the above assessment and plan. Exam, brady regular rhythm.  Changes to above are made where necessary.   The patient has symptomatic complete heart block.  No reversible causes are found.   I would therefore recommend pacemaker implantation at this time.  Risks, benefits, alternatives to pacemaker implantation were discussed in detail with the patient today. The patient understands that the risks include but are not limited to bleeding, infection, pneumothorax, perforation, tamponade, vascular damage, renal failure, MI, stroke, death,  and  lead dislodgement and wishes to proceed. We will therefore schedule the procedure at the next available time.  Echo is pending.   Co Sign: [name], MD 10/24/2014 3:28 PM

## 2014-10-24 NOTE — Interval H&P Note (Signed)
History and Physical Interval Note:  10/24/2014 3:30 PM  David [name]  has presented today for surgery, with the diagnosis of CHB  The various methods of treatment have been discussed with the patient and family. After consideration of risks, benefits and other options for treatment, the patient has consented to  Procedure(s): PERMANENT PACEMAKER INSERTION (N/A) as a surgical intervention .  The patient's history has been reviewed, patient examined, no change in status, stable for surgery.  I have reviewed the patient's chart and labs.  Questions were answered to the patient's satisfaction.     [name]

## 2014-10-24 NOTE — Op Note (Signed)
SURGEON:  [name], MD     PREPROCEDURE DIAGNOSIS:  Symptomatic complete heart block    POSTPROCEDURE DIAGNOSIS:  Symptomatic complete heart block     PROCEDURES:   1. Left upper extremity venography.   2. Pacemaker implantation.     INTRODUCTION: David [name] is a 79 y.o. male  with a history of complete heart block who presents today for pacemaker implantation.  The patient reports intermittent episodes of fatigue for several days.  No reversible causes have been identified.  The patient therefore presents today for pacemaker implantation.     DESCRIPTION OF PROCEDURE:  Informed written consent was obtained, and the patient was brought to the electrophysiology lab in a fasting state.  The patient required no sedation for the procedure today.  The patients left chest was prepped and draped in the usual sterile fashion by the EP lab staff. The skin overlying the left deltopectoral region was infiltrated with lidocaine for local analgesia.  A 4-cm incision was made over the left deltopectoral region.  A left subcutaneous pacemaker pocket was fashioned using a combination of sharp and blunt dissection. Electrocautery was required to assure hemostasis.    Left Upper Extremity Venography: A venogram of the left upper extremity was performed, which revealed a large left cephalic vein, which emptied into a large left subclavian vein.  The left axillary vein was moderate in size.    RA/RV Lead Placement: The left axillary vein was therefore cannulated.  Through the left axillary vein, a Medtronic model [id number] (serial number [id number]) right atrial lead and a Medtronic model 2458- 9 (serial number [id number]) right ventricular lead were advanced with fluoroscopic visualization into the right atrial appendage and right ventricular apex positions respectively.  Initial atrial lead P- waves measured 2.6 mV with impedance of 626 ohms and a threshold of 1.2 V at 0.5 msec.  Right ventricular lead  R-waves measured 5 mV with an impedance of 937 ohms and a threshold of 0.9 V at 0.5 msec.  Both leads were secured to the pectoralis fascia using #2-0 silk over the suture sleeves.   Device Placement:  The leads were then connected to a Medtronic Adapta L model ADDRL 1 (serial number [id number]) pacemaker.  The pocket was irrigated with copious gentamicin solution.  The pacemaker was then placed into the pocket.  The pocket was then closed in 2 layers with 2.0 Vicryl suture for the subcutaneous and subcuticular layers.  Steri- Strips and a sterile dressing were then applied. EBL<65ml. There were no early apparent complications.     CONCLUSIONS:   1. Successful implantation of a Medtronic Adapta L dual-chamber pacemaker for symptomatic complete heart block  2. No early apparent complications.       Permanent Pacemaker Indication: Documented non-reversible symptomatic bradycardia due to second degree and/or third degree atrioventricular block.     [name], MD 10/24/2014 5:46 PM

## 2014-10-24 NOTE — H&P (Signed)
David [name]  10/24/2014 11:45 AM  Office Visit  MRN:  [id number]   Description: Male DOB: 11-12-1935  Provider: Lelon Perla, MD  Department: [facility]       Vital Signs  Most recent update: 10/24/2014 11:48 AM by Dionne Bucy Truitt, CMA    BP Pulse Ht Wt BMI    140/62 mmHg 48 5\' 9"  (1.753 m) 229 lb 6.4 oz (104.055 kg) 33.86 kg/m2      Progress Notes      Lelon Perla, MD at 10/24/2014 12:03 PM     Status: Signed       Expand All Collapse All        HPI: 79 year old male for evaluation of complete heart block. Patient has had previous PCI of his LAD in 2003. He also has a history of left bundle branch block. Echocardiogram in May 2012 showed normal LV function, grade 1 diastolic dysfunction, mild aortic insufficiency and mild tricuspid regurgitation. Previous nuclear study in May 2012 showed diaphragmatic attenuation but no ischemia. Patient presented for a nuclear study today. He was noted to have complete heart block on electrocardiogram prior to study. Patient denies dyspnea, chest pain, palpitations or syncope. He did state that this past Saturday while trying to [place] he had significant increased fatigue compared to normal and he had to stop more frequently.  Current Outpatient Prescriptions  Medication Sig Dispense Refill  . aspirin EC 81 MG tablet Take 81 mg by mouth daily.     Marland Kitchen ezetimibe-simvastatin (VYTORIN) 10-40 MG per tablet Take 1 tablet by mouth at bedtime.    . fish oil-omega-3 fatty acids 1000 MG capsule Take 2 g by mouth daily.    Marland Kitchen glucosamine-chondroitin 500-400 MG tablet Take 2 tablets by mouth daily.     . Meth-Hyo-M Bl-Na Phos-Ph Sal (URIBEL) 118 MG CAPS Take 1 capsule by mouth as needed.    . Multiple Vitamins-Minerals (MULTIVITAMIN WITH MINERALS) tablet Take 1 tablet by mouth daily.    . nitroGLYCERIN (NITROLINGUAL) 0.4 MG/SPRAY spray Place 1 spray under the tongue every 5 (five) minutes x 3 doses as needed  for chest pain. 12 g 12  . tamsulosin (FLOMAX) 0.4 MG CAPS capsule Take 0.4 mg by mouth daily.    Marland Kitchen triamterene-hydrochlorothiazide (MAXZIDE-25) 37.5-25 MG per tablet Take 1 tablet by mouth daily.    . vitamin C (ASCORBIC ACID) 250 MG tablet Take 250 mg by mouth daily.     No current facility-administered medications for this visit.     Past Medical History  Diagnosis Date  . Hyperlipidemia   . Cancer     skin  . CAD (coronary artery disease)   . LBBB (left bundle branch block)   . Hypertension     Past Surgical History  Procedure Laterality Date  . Tonsillectomy    . Coronary angioplasty with stent placement  03/01/2002    stenting to LAD - Dr. Glade Lloyd  . US echocardiography  12/05/2010    proximal septal thickening,mild concentric LVH,mild deptal hypokinesis,RV mildly dilated,LA mildly dilated,mild AI,moderate aortic root dilatation,septal motion c/w conduction abnormality  . Nm myocar perf wall motion  11/27/2010    normal    History   Social History  . Marital Status: Married    Spouse Name: N/A  . Number of Children: N/A  . Years of Education: N/A   Occupational History  . Not on file.   Social History Main Topics  . Smoking status: Former Smoker    Types: Cigarettes, Cigars  Quit date: 10/27/1982  . Smokeless tobacco: Never Used  . Alcohol Use: Yes     Comment: 1-2 drink per week  . Drug Use: No  . Sexual Activity: Not on file   Other Topics Concern  . Not on file   Social History Narrative    ROS: no fevers or chills, productive cough, hemoptysis, dysphasia, odynophagia, melena, hematochezia, dysuria, hematuria, rash, seizure activity, orthopnea, PND, pedal edema, claudication. Remaining systems are negative.  Physical Exam: Well-developed well-nourished in no acute distress.  Patient does not appear to be  depressed. No peripheral clubbing. Skin is warm and dry.  HEENT is normal. Normal eyelids Neck is supple. Normal upstroke with no bruits Chest is clear to auscultation with normal expansion.  Cardiovascular exam is regular by bradycardic. Abdominal exam nontender or distended. No masses palpated. 2+ femoral pulses. No bruits. Extremities show no edema. neuro grossly intact  ECG complete heart block right bundle branch block inferior T-wave inversion.                Complete heart block - Lelon Perla, MD at 10/24/2014 12:39 PM     Status: Written Related Problem: Complete heart block   Expand All Collapse All   Patient presented for exercise treadmill today. Prior to his test he was noted to be in complete heart block. He has a history of left bundle branch block and now has demonstration of alternating right bundle branch block. He did have increased fatigue with activities this past Saturday which may have been related to his bradycardia. We will admit to telemetry. I will ask electrophysiology to evaluate for pacemaker. Note he is on no medications that would exacerbate bradycardia. I will arrange an echocardiogram to assess LV function prior to pacemaker placement.            HTN (hypertension) - Lelon Perla, MD at 10/24/2014 12:39 PM     Status: Written Related Problem: HTN (hypertension)   Expand All Collapse All   Continue present blood pressure medications.            Hyperlipidemia with target LDL less than 70 - Lelon Perla, MD at 10/24/2014 12:39 PM     Status: Written Related Problem: Hyperlipidemia with target LDL less than 70   Expand All Collapse All   Continue statin.            LAD stent 2003 - Lelon Perla, MD at 10/24/2014 12:40 PM     Status: Written Related Problem: LAD stent 2003   Expand All Collapse All   Continue aspirin and statin.

## 2014-10-24 NOTE — Progress Notes (Signed)
  Echocardiogram 2D Echocardiogram has been performed.  Lysle Rubens 10/24/2014, 3:44 PM

## 2014-10-24 NOTE — Procedures (Addendum)
[facility] [address] [place] [address] [phone]  Cardiology Nuclear Med Study  David [name] is a 79 y.o. male     MRN : [id number]     DOB: 03/18/1936  Procedure Date: 10/24/2014  Nuclear Med Background Indication for Stress Test:  Follow up CAD History:  CAD;STENT/PTCA-LAD-2003;Last NUC MPI 11/2010-normal;AS/AI Cardiac Risk Factors: History of Smoking, Hypertension and Lipids  Symptoms:  Fatigue   Nuclear Pre-Procedure Caffeine/Decaff Intake:  9:00pm NPO After: 5:00am   IV Site: R Hand  IV 0.9% NS with Angio Cath:  22g  Chest Size (in):  46" IV Started by: Rolene Course, RN  Height: 5\' 10"  (1.778 m)  Cup Size: n/a  BMI:  Body mass index is 32 kg/(m^2). Weight:  223 lb (101.152 kg)   Tech Comments: Cx Lexiscan Stress per Dr Stanford Breed. Pt to see Dr Stanford Breed today 10/24/2014 @ 1145 am.    Nuclear Med Study 1 or 2 day study: 1 day  Stress Test Type:  [place]  Order Authorizing Provider:  Shelva Majestic, MD   Resting Radionuclide: Technetium 47m Sestamibi  Resting Radionuclide Dose: 10.9 mCi   Stress Radionuclide:N/A Stress Radionuclide Dose: N/A          Stress Protocol Rest HR: 41 bpm Stress HR: n/a  Rest BP: 141/79 Stress BP: n/a  Exercise Time (min): n/a METS: n/a          Dose of Adenosine (mg): n/a Dose of Lexiscan: n/a  Dose of Atropine (mg): n/a Dose of Dobutamine:n/a mcg/kg/min (at max HR)  Stress Test Technologist:n/a Nuclear Technologist: Imagene Riches, CNMT   Rest Procedure:  Myocardial perfusion imaging was performed at rest 45 minutes following the intravenous administration of Technetium 42m Sestamibi. Stress Procedure: n/a  Transient Ischemic Dilatation (Normal <1.22):  n/a  QGS EDV:  n/a ml QGS ESV:  n/a ml LV Ejection Fraction: n/a       Rest ECG: No EKG performed  Stress ECG: Rest only images  QPS Raw Data Images:  Normal; no motion artifact; normal heart/lung  ratio. Stress Images:  There is decreased uptake in the apex. Rest Images:  There is decreased uptake in the apex. Subtraction (SDS):  Rest images only  Impression Exercise Capacity:  Rest images only BP Response:  Normal blood pressure response. Clinical Symptoms:  No symptoms. ECG Impression:  Rest images only, no EKG performed Comparison with Prior Nuclear Study: No images to compare  Overall Impression:  Low risk stress nuclear study Low risk Rest only images with inferoapical thinning.  LV Wall Motion:  Stress images not performed   Lorretta Harp, MD  10/24/2014 2:33 PM

## 2014-10-24 NOTE — Consult Note (Signed)
Reason for Consult: CHB   Referring Physician: Dr. Stanford Breed   PCP:  Horatio Pel, MD  Primary Cardiologist:Dr. Tracie Harrier Gomes is an 79 y.o. male.    Chief Complaint: found in CHB when presented for Nuc study, did have increased fatigue on Sat presumed due to CHB    HPI: 79 year old WM with hx CAD with stent to LAD in 2003.  He has HTN, hyperlipidemia and chronic LBBB.   His last echo Doppler study in May 2012 showed mild concentric LVH with an ejection fraction of 50-55%. There was grade 1 diastolic dysfunction the mitral valve E/A ratio of 0.62. There was mild aortic sclerosis with mild aortic insufficiency, mild mitral annular calcification with trace MR, mild TR, mild left atrial dilatation. His last nuclear perfusion study in May 2012 showed mild diaphragmatic attenuation without ischemia. Last year he was seen by Dr. Claiborne Billings and arranged for 4 year follow up nuc study to be done this year prior to visit.  He has chronic LBBB.   Today on presentation for Nuc study he was in CHB and felt well though on Sat. He felt weak and fatigued.  Nuc study was continued as he had no significant symptoms.   Awaiting results. He was then transferred to [facility] for echo, labs and probable PPM.  On arrival he feels well.  Denies any chest pain.  No SOB.  He has been NPO.    Past Medical History  Diagnosis Date  . Hyperlipidemia   . Cancer     skin  . CAD (coronary artery disease)     previous stent to LAD 2003  . LBBB (left bundle branch block)   . Hypertension     Past Surgical History  Procedure Laterality Date  . Tonsillectomy    . Coronary angioplasty with stent placement  03/01/2002    stenting to LAD - Dr. Glade Lloyd  . US echocardiography  12/05/2010    proximal septal thickening,mild concentric LVH,mild deptal hypokinesis,RV mildly dilated,LA mildly dilated,mild AI,moderate aortic root dilatation,septal motion c/w conduction abnormality  . Nm myocar perf wall  motion  11/27/2010    normal    Family History  Problem Relation Age of Onset  . Cancer Mother     breast  . Cancer Father     colon/lung   Social History:  reports that he quit smoking about 32 years ago. His smoking use included Cigarettes and Cigars. He has never used smokeless tobacco. He reports that he drinks alcohol. He reports that he does not use illicit drugs.  Allergies:  Allergies  Allergen Reactions  . Gris-Peg [Griseofulvin] Other (See Comments)    headaches    Medications Prior to Admission  Medication Sig Dispense Refill  . aspirin EC 81 MG tablet Take 81 mg by mouth daily.     Marland Kitchen ezetimibe-simvastatin (VYTORIN) 10-40 MG per tablet Take 1 tablet by mouth at bedtime.    . fish oil-omega-3 fatty acids 1000 MG capsule Take 2 g by mouth daily.    Marland Kitchen glucosamine-chondroitin 500-400 MG tablet Take 2 tablets by mouth daily.     . Meth-Hyo-M Bl-Na Phos-Ph Sal (URIBEL) 118 MG CAPS Take 1 capsule by mouth as needed.    . Multiple Vitamins-Minerals (MULTIVITAMIN WITH MINERALS) tablet Take 1 tablet by mouth daily.    . nitroGLYCERIN (NITROLINGUAL) 0.4 MG/SPRAY spray Place 1 spray under the tongue every 5 (five) minutes x 3 doses as needed for  chest pain. 12 g 12  . tamsulosin (FLOMAX) 0.4 MG CAPS capsule Take 0.4 mg by mouth daily.    Marland Kitchen triamterene-hydrochlorothiazide (MAXZIDE-25) 37.5-25 MG per tablet Take 1 tablet by mouth daily.    . vitamin C (ASCORBIC ACID) 250 MG tablet Take 250 mg by mouth daily.      No results found for this or any previous visit (from the past 48 hour(s)). No results found.  ROS: General:no colds or fevers, no weight changes, weakness on Sat. With exertion Skin:no rashes or ulcers HEENT:no blurred vision, no congestion CV:see HPI PUL:see HPI GI:no diarrhea constipation or melena, no indigestion GU:no hematuria, no dysuria MS:no joint pain, no claudication Neuro:no syncope, no lightheadedness Endo:no diabetes, no thyroid disease   Blood  pressure 151/89, pulse 45, temperature 98.3 F (36.8 C), temperature source Oral, resp. rate 18, height 5\' 9"  (1.753 m), weight 224 lb 8 oz (101.833 kg), SpO2 97 %.  Wt Readings from Last 3 Encounters:  10/24/14 224 lb 8 oz (101.833 kg)  10/24/14 229 lb 6.4 oz (104.055 kg)  10/26/13 223 lb 4.8 oz (101.288 kg)    PE: General:Pleasant affect, NAD Skin:Warm and dry, brisk capillary refill HEENT:normocephalic, sclera clear, mucus membranes moist Neck:supple, no JVD, no bruits  Heart:S1S2 RRR slow, without murmur, gallup, rub or click Lungs:clear without rales, rhonchi, or wheezes TSV:XBLT, non tender, + BS, do not palpate liver spleen or masses Ext:no lower ext edema, 2+ pedal pulses, 2+ radial pulses Neuro:alert and oriented X 3, MAE, follows commands, + facial symmetry    Assessment/Plan Principal Problem:   CHB (complete heart block)- currently with minimal activity stable with BP 151/89- have ordered stat echo, labs - have not yet discussed PPM with pt.  Dr. Rayann Heman to see for probable PPM   Active Problems:   LAD stent 2003- awaiting nuc results   HTN (hypertension)- elevated with his bradycardia   Hyperlipidemia with target LDL less than 70    [facility] R  Nurse Practitioner Certified [place] Pager [phone] or after 5pm or weekends call [phone] 10/24/2014, 1:56 PM  I have seen, examined the patient, and reviewed the above assessment and plan. Exam, brady regular rhythm.  Changes to above are made where necessary.   The patient has symptomatic complete heart block.  No reversible causes are found.   I would therefore recommend pacemaker implantation at this time.  Risks, benefits, alternatives to pacemaker implantation were discussed in detail with the patient today. The patient understands that the risks include but are not limited to bleeding, infection, pneumothorax, perforation, tamponade, vascular damage, renal failure, MI, stroke, death,  and  lead dislodgement and wishes to proceed. We will therefore schedule the procedure at the next available time.  Echo is pending.   Co Sign: [name], MD 10/24/2014 3:28 PM

## 2014-10-24 NOTE — Assessment & Plan Note (Signed)
Continue aspirin and statin. 

## 2014-10-24 NOTE — Assessment & Plan Note (Signed)
Continue statin. 

## 2014-10-24 NOTE — Assessment & Plan Note (Signed)
Patient presented for exercise treadmill today. Prior to his test he was noted to be in complete heart block. He has a history of left bundle branch block and now has demonstration of alternating right bundle branch block. He did have increased fatigue with activities this past Saturday which may have been related to his bradycardia. We will admit to telemetry. I will ask electrophysiology to evaluate for pacemaker. Note he is on no medications that would exacerbate bradycardia. I will arrange an echocardiogram to assess LV function prior to pacemaker placement.

## 2014-10-24 NOTE — Assessment & Plan Note (Signed)
Continue present blood pressure medications. 

## 2014-10-24 NOTE — Progress Notes (Signed)
HPI: 79 year old male for evaluation of complete heart block. Patient has had previous PCI of his LAD in 2003. He also has a history of left bundle branch block. Echocardiogram in May 2012 showed normal LV function, grade 1 diastolic dysfunction, mild aortic insufficiency and mild tricuspid regurgitation. Previous nuclear study in May 2012 showed diaphragmatic attenuation but no ischemia. Patient presented for a nuclear study today. He was noted to have complete heart block on electrocardiogram prior to study. Patient denies dyspnea, chest pain, palpitations or syncope. He did state that this past Saturday while trying to [place] he had significant increased fatigue compared to normal and he had to stop more frequently.  Current Outpatient Prescriptions  Medication Sig Dispense Refill  . aspirin EC 81 MG tablet Take 81 mg by mouth daily.     Marland Kitchen ezetimibe-simvastatin (VYTORIN) 10-40 MG per tablet Take 1 tablet by mouth at bedtime.    . fish oil-omega-3 fatty acids 1000 MG capsule Take 2 g by mouth daily.    Marland Kitchen glucosamine-chondroitin 500-400 MG tablet Take 2 tablets by mouth daily.     . Meth-Hyo-M Bl-Na Phos-Ph Sal (URIBEL) 118 MG CAPS Take 1 capsule by mouth as needed.    . Multiple Vitamins-Minerals (MULTIVITAMIN WITH MINERALS) tablet Take 1 tablet by mouth daily.    . nitroGLYCERIN (NITROLINGUAL) 0.4 MG/SPRAY spray Place 1 spray under the tongue every 5 (five) minutes x 3 doses as needed for chest pain. 12 g 12  . tamsulosin (FLOMAX) 0.4 MG CAPS capsule Take 0.4 mg by mouth daily.    Marland Kitchen triamterene-hydrochlorothiazide (MAXZIDE-25) 37.5-25 MG per tablet Take 1 tablet by mouth daily.    . vitamin C (ASCORBIC ACID) 250 MG tablet Take 250 mg by mouth daily.     No current facility-administered medications for this visit.     Past Medical History  Diagnosis Date  . Hyperlipidemia   . Cancer     skin  . CAD (coronary artery disease)   . LBBB (left bundle branch block)   . Hypertension      Past Surgical History  Procedure Laterality Date  . Tonsillectomy    . Coronary angioplasty with stent placement  03/01/2002    stenting to LAD - Dr. Glade Lloyd  . US echocardiography  12/05/2010    proximal septal thickening,mild concentric LVH,mild deptal hypokinesis,RV mildly dilated,LA mildly dilated,mild AI,moderate aortic root dilatation,septal motion c/w conduction abnormality  . Nm myocar perf wall motion  11/27/2010    normal    History   Social History  . Marital Status: Married    Spouse Name: N/A  . Number of Children: N/A  . Years of Education: N/A   Occupational History  . Not on file.   Social History Main Topics  . Smoking status: Former Smoker    Types: Cigarettes, Cigars    Quit date: 10/27/1982  . Smokeless tobacco: Never Used  . Alcohol Use: Yes     Comment: 1-2 drink per week  . Drug Use: No  . Sexual Activity: Not on file   Other Topics Concern  . Not on file   Social History Narrative    ROS: no fevers or chills, productive cough, hemoptysis, dysphasia, odynophagia, melena, hematochezia, dysuria, hematuria, rash, seizure activity, orthopnea, PND, pedal edema, claudication. Remaining systems are negative.  Physical Exam: Well-developed well-nourished in no acute distress.  Patient does not appear to be depressed. No peripheral clubbing. Skin is warm and dry.  HEENT is normal. Normal eyelids  Neck is supple. Normal upstroke with no bruits Chest is clear to auscultation with normal expansion.  Cardiovascular exam is regular by bradycardic. Abdominal exam nontender or distended. No masses palpated. 2+ femoral pulses. No bruits. Extremities show no edema. neuro grossly intact  ECG complete heart block right bundle branch block inferior T-wave inversion.

## 2014-10-24 NOTE — Progress Notes (Signed)
Pt back from in room. Pacermaker site is clean, dry and intact with gauze in place. No bleeding or hematoma present. Pt given education regarding left arm activity restrictions. Sling in place. Family at bedside. Tele in place. VSS. Bed is low and brakes are locked. Call bell within reach. Will continue to monitor.

## 2014-10-25 ENCOUNTER — Inpatient Hospital Stay (HOSPITAL_COMMUNITY): Payer: Medicare Other

## 2014-10-25 ENCOUNTER — Encounter (HOSPITAL_COMMUNITY): Payer: Self-pay | Admitting: Internal Medicine

## 2014-10-25 DIAGNOSIS — Z950 Presence of cardiac pacemaker: Secondary | ICD-10-CM | POA: Diagnosis not present

## 2014-10-25 HISTORY — DX: Presence of cardiac pacemaker: Z95.0

## 2014-10-25 LAB — HEMOGLOBIN A1C
Hgb A1c MFr Bld: 6.6 % — ABNORMAL HIGH (ref 4.8–5.6)
Mean Plasma Glucose: 143 mg/dL

## 2014-10-25 LAB — BASIC METABOLIC PANEL
Anion gap: 4 — ABNORMAL LOW (ref 5–15)
BUN: 13 mg/dL (ref 6–23)
CHLORIDE: 104 mmol/L (ref 96–112)
CO2: 31 mmol/L (ref 19–32)
CREATININE: 0.83 mg/dL (ref 0.50–1.35)
Calcium: 8.3 mg/dL — ABNORMAL LOW (ref 8.4–10.5)
GFR calc Af Amer: >90 mL/min (ref >90)
GFR calc non Af Amer: 83 mL/min — ABNORMAL LOW (ref >90)
GLUCOSE: 86 mg/dL (ref 70–99)
Potassium: 3.4 mmol/L — ABNORMAL LOW (ref 3.5–5.1)
Sodium: 139 mmol/L (ref 135–145)

## 2014-10-25 MED ORDER — POTASSIUM CHLORIDE CRYS ER 20 MEQ PO TBCR
40.0000 meq | EXTENDED_RELEASE_TABLET | Freq: Once | ORAL | Status: AC
  Administered 2014-10-25: 40 meq via ORAL

## 2014-10-25 MED ORDER — HYDROCODONE-ACETAMINOPHEN 5-325 MG PO TABS: 1.0000 | ORAL_TABLET | ORAL | Status: DC | PRN

## 2014-10-25 NOTE — Discharge Instructions (Signed)
No travel greater than 60 miles for 2 weeks.  Keep appt. With Dr. Claiborne Billings  We may schedule for follow up stress test in 4-5 months.   Heart Healthy low sugar diet..  We may adjust your BP meds on next appt.      Supplemental Discharge Instructions for  Pacemaker Patients  Activity No heavy lifting or vigorous activity with your left arm for 6 to 8 weeks.  Do not raise your left arm above your head for one week.  Gradually raise your affected arm as drawn below.           __4/1/16                              10/29/14                 10/31/14                         11/02/14   NO DRIVING for  1 week   ; you may begin driving on 08/29/42     .  WOUND CARE - Keep the wound area clean and dry.  Do not get this area wet for one week. No showers for one week; you may shower on 11/01/14    . - The tape/steri-strips on your wound will fall off; do not pull them off.  No bandage is needed on the site.  DO  NOT apply any creams, oils, or ointments to the wound area. - If you notice any drainage or discharge from the wound, any swelling or bruising at the site, or you develop a fever > 101? F after you are discharged home, call the office at once.  Special Instructions - You are still able to use cellular telephones; use the ear opposite the side where you have your pacemaker.  Avoid carrying your cellular phone near your device. - When traveling through airports, show security personnel your identification card to avoid being screened in the metal detectors.  Ask the security personnel to use the hand wand. - Avoid arc welding equipment, MRI testing (magnetic resonance imaging), TENS units (transcutaneous nerve stimulators).  Call the office for questions about other devices. - Avoid electrical appliances that are in poor condition or are not properly grounded. - Microwave ovens are safe to be near or to operate.

## 2014-10-25 NOTE — Progress Notes (Signed)
UR COMPLETED  

## 2014-10-25 NOTE — Progress Notes (Signed)
Subjective: Only one pain pill last pm   Objective: Vital signs in last 24 hours: Temp:  [97.7 F (36.5 C)-98.4 F (36.9 C)] 98.4 F (36.9 C) (03/30 0600) Pulse Rate:  [45-68] 58 (03/30 0325) Resp:  [14-25] 24 (03/30 0600) BP: (133-152)/(62-92) 152/92 mmHg (03/30 0600) SpO2:  [93 %-97 %] 93 % (03/30 0600) Weight:  [223 lb (101.152 kg)-229 lb 6.4 oz (104.055 kg)] 224 lb 12.8 oz (101.969 kg) (03/30 0600) Weight change:  Last BM Date: 10/24/14 Intake/Output from previous day: +190 03/29 0701 - 03/30 0700 In: 440 [P.O.:240; I.V.:100; IV Piggyback:100] Out: 250 [Urine:250] Intake/Output this shift:    PE: General:Pleasant affect, NAD Skin:Warm and dry, brisk capillary refill HEENT:normocephalic, sclera clear, mucus membranes moist Heart:S1S2 RRR with soft murmur, no gallup, rub or click Lungs:clear, ant without rales, rhonchi, or wheezes CHE:NIDP, non tender, + BS, do not palpate liver spleen or masses Ext:no lower ext edema, 2+ pedal pulses, 2+ radial pulses Neuro:alert and oriented X 3, MAE, follows commands, + facial symmetry Tele: AV pacing    Lab Results:  Recent Labs  10/24/14 1532  WBC 6.6  HGB 15.0  HCT 44.5  PLT 194   BMET  Recent Labs  10/24/14 1532 10/25/14 0247  NA 138 139  K 3.4* 3.4*  CL 104 104  CO2 30 31  GLUCOSE 104* 86  BUN 16 13  CREATININE 0.80 0.83  CALCIUM 8.5 8.3*    Recent Labs  10/24/14 1532  TROPONINI <0.03    No results found for: CHOL, HDL, LDLCALC, LDLDIRECT, TRIG, CHOLHDL Lab Results  Component Value Date   HGBA1C 6.6* 10/24/2014     Lab Results  Component Value Date   TSH 1.402 10/24/2014    Hepatic Function Panel  Recent Labs  10/24/14 1532  PROT 6.4  ALBUMIN 3.7  AST 25  ALT 23  ALKPHOS 54  BILITOT 1.7*   No results for input(s): CHOL in the last 72 hours. No results for input(s): PROTIME in the last 72 hours.     Studies/Results: Portable Chest 1 View  10/24/2014   CLINICAL DATA:   79 year old male with heart block. Initial encounter.  EXAM: PORTABLE CHEST - 1 VIEW  COMPARISON:  No comparison exams available.  FINDINGS: Slightly poor inspiratory portable chest x-ray with elevation right hemidiaphragm limiting evaluation of the right lung base. Taking this limitation into account, no segmental infiltrate is noted.  Cardiomegaly.  Central pulmonary vascular prominence.  Calcified slightly prominent aortic arch.  IMPRESSION: Slightly poor inspiratory portable chest x-ray with elevation right hemidiaphragm.  Cardiomegaly.  Central pulmonary vascular prominence.  Calcified slightly prominent aortic arch.   Electronically Signed   By: Genia Del M.D.   On: 10/24/2014 15:26    Medications: I have reviewed the patient's current medications. Scheduled Meds: . aspirin EC  81 mg Oral Daily  .  ceFAZolin (ANCEF) IV  1 g Intravenous Q6H  . ezetimibe-simvastatin  1 tablet Oral QHS  . sodium chloride  3 mL Intravenous Q12H  . tamsulosin  0.4 mg Oral Daily  . triamterene-hydrochlorothiazide  1 tablet Oral Daily   Continuous Infusions:  PRN Meds:.sodium chloride, acetaminophen, HYDROcodone-acetaminophen, nitroGLYCERIN, ondansetron (ZOFRAN) IV, polyethylene glycol, sodium chloride, zolpidem  Assessment/Plan: Principal Problem:   CHB (complete heart block), symptomatic now with pacer   Active Problems:   S/P cardiac pacemaker procedure, placement 10/24/14 Medtronic Adapta L model ADDRL 1 - post CXR without pneumothorax.    LAD  stent 2003- neg troponin, low risk nuc study prior to admit, ECHO: LVEF 55-60%, moderate LVH, diastolic dysfunction, mild AI,dilated aortic root up to 4.3 cm, moderate TR, RVSP 43 mmHg.   HTN (hypertension)   Hyperlipidemia with target LDL less than 70   Hypokalemia  3.4 replace   elevated HgbA1c - will have follow up with PCP  Pacer interrogated and stable   Ambulate and probable discharge home    LOS: 1 day   Time spent with pt. :15  minutes. [facility]  Nurse Practitioner Certified Pager [phone] or after 5pm and on weekends call [phone] 10/25/2014, 7:57 AM   I have seen, examined the patient, and reviewed the above assessment and plan.  On exam, pacemaker pocket is without hematoma.  Lungs are clear.  Changes to above are made where necessary.   Device interrogation is reviewed and normal.  CXR reveals stable leads.  No hematoma. Will need 10 day wound check Follow-up with me in 3 months  DC to home today  Co Sign: [name], MD 10/25/2014 9:30 AM

## 2014-10-25 NOTE — Discharge Summary (Signed)
Physician Discharge Summary       Patient ID: David [name] MRN: [id number] DOB/AGE: 1935/11/02 79 y.o.  Admit date: 10/24/2014 Discharge date: 10/25/2014 Primary Cardiologist:Dr. Claiborne Billings EP: Dr. Rayann Heman    Discharge Diagnoses:  Principal Problem:   CHB (complete heart block), symptomatic Active Problems:   S/P cardiac pacemaker procedure, placement 10/24/14 Medtronic Adapta L model ADDRL 1    LAD stent 2003   HTN (hypertension)   Hyperlipidemia with target LDL less than 70   Discharged Condition: good  Procedures: 10/24/14 Medtronic permanent pacemaker by Dr. Rayann Heman.  Hospital Course:  79 year old WM with hx CAD with stent to LAD in 2003. He has HTN, hyperlipidemia and chronic LBBB. His last echo Doppler study in May 2012 showed mild concentric LVH with an ejection fraction of 50-55%. There was grade 1 diastolic dysfunction the mitral valve E/A ratio of 0.62. There was mild aortic sclerosis with mild aortic insufficiency, mild mitral annular calcification with trace MR, mild TR, mild left atrial dilatation. His last nuclear perfusion study in May 2012 showed mild diaphragmatic attenuation without ischemia. Last year he was seen by Dr. Claiborne Billings and arranged for 4 year follow up nuc study to be done this year prior to visit. He has chronic LBBB.   10/23/14 on his presentation for Nuc study he was in [place] and felt well- though on Sat. he felt weak and fatigued. Nuc study was continued as he had no significant symptoms. Awaiting results. He was then transferred to [facility] for echo, labs and PPM. Also echo results below.  He had Metronic PPM placed without complications.  He returned to his room and did well overnight.  He was seen by Dr. Rayann Heman the day of discharge and found stable to go home.  His pacer check was normal, and no pneumothorax on CXR.  His HgbA1C was elevated, we discussed diet and exercise.  He will follow up with his PCP for this, he knew he was borderline diabetic.  He  will keep appt with Dr. Claiborne Billings, who will monitor his BP which was somewhat elevated here.  Also he may need repeat nuc study in 4 months now with PPM.  He has wound check scheduled and then 3 month follow up with Dr. Rayann Heman.    Consults: cardiology  Significant Diagnostic Studies:  BMET    Component Value Date/Time   NA 139 10/25/2014 0247   K 3.4* 10/25/2014 0247   CL 104 10/25/2014 0247   CO2 31 10/25/2014 0247   GLUCOSE 86 10/25/2014 0247   BUN 13 10/25/2014 0247   CREATININE 0.83 10/25/2014 0247   CALCIUM 8.3* 10/25/2014 0247   GFRNONAA 83* 10/25/2014 0247   GFRAA >90 10/25/2014 0247    CBC    Component Value Date/Time   WBC 6.6 10/24/2014 1532   RBC 4.87 10/24/2014 1532   HGB 15.0 10/24/2014 1532   HCT 44.5 10/24/2014 1532   PLT 194 10/24/2014 1532   MCV 91.4 10/24/2014 1532   MCH 30.8 10/24/2014 1532   MCHC 33.7 10/24/2014 1532   RDW 13.6 10/24/2014 1532   LYMPHSABS 2.1 10/24/2014 1532   MONOABS 0.5 10/24/2014 1532   EOSABS 0.2 10/24/2014 1532   BASOSABS 0.0 10/24/2014 1532     Mag 1.9   TSH 1.402  HgbA1c 6.6  Hepatic Function Latest Ref Rng 10/24/2014  Total Protein 6.0 - 8.3 g/dL 6.4  Albumin 3.5 - 5.2 g/dL 3.7  AST 0 - 37 U/L 25  ALT 0 - 53  U/L 23  Alk Phosphatase 39 - 117 U/L 54  Total Bilirubin 0.3 - 1.2 mg/dL 1.7(H)  CHEST 2 VIEW COMPARISON: Portable chest x-ray of October 24, 2014 FINDINGS: The lungs are well-expanded. The interstitial markings are coarse. The pulmonary vascularity is not engorged. There is no pneumothorax or pleural effusion. The cardiac silhouette remains enlarged. There is stable tortuosity of the descending thoracic aorta. The pacemaker electrodes are in reasonable position radiographically. The pacemaker generator projects over the left pectoral region. There is stable mild dextrocurvature of the mid thoracic spine. IMPRESSION: There is no postprocedure complication following pacemaker placement. Stable enlargement of  the cardiac silhouette.  ECHO: Left ventricle: The cavity size was normal. Wall thickness was increased in a pattern of moderate LVH. Systolic function was normal. The estimated ejection fraction was in the range of 55% to 60%. Although no diagnostic regional wall motion abnormality was identified, this possibility cannot be completely excluded on the basis of this study. Doppler parameters are consistent with abnormal left ventricular relaxation (grade 1 diastolic dysfunction). The E/e&' ratio is between 8-15, suggesting indeterminate LV filling pressure. - Aortic valve: Trileaflet. Transvalvular velocity was within the normal range. There was no stenosis. There was mild regurgitation. - Aorta: Aortic root dimension: 43 mm (ED). - Ascending aorta: The ascending aorta was mildly dilated. - Mitral valve: There was trivial regurgitation. - Left atrium: The atrium was normal in size. - Right atrium: The atrium was at the upper limits of normal in size. - Atrial septum: No defect or patent foramen ovale was identified. - Tricuspid valve: There was moderate regurgitation. - Pulmonary arteries: PA peak pressure: 43 mm Hg (S). - Inferior vena cava: The vessel was normal in size. The respirophasic diameter changes were in the normal range (>= 50%), consistent with normal central venous pressure.  Impressions:  - LVEF 55-60%, moderate LVH, diastolic dysfunction, mild AI, dilated aortic root up to 4.3 cm, moderate TR, RVSP 43 mmHg.   Discharge Exam: Blood pressure 142/75, pulse 60, temperature 98.6 F (37 C), temperature source Oral, resp. rate 20, height '5\' 9"'  (1.753 m), weight 224 lb 12.8 oz (101.969 kg), SpO2 93 %.  Disposition: Home     Medication List    TAKE these medications        aspirin EC 81 MG tablet  Take 81 mg by mouth daily.     ezetimibe-simvastatin 10-40 MG per tablet  Commonly known as:  VYTORIN  Take 1 tablet by mouth at  bedtime.     fish oil-omega-3 fatty acids 1000 MG capsule  Take 2 g by mouth daily.     glucosamine-chondroitin 500-400 MG tablet  Take 2 tablets by mouth daily.     HYDROcodone-acetaminophen 5-325 MG per tablet  Commonly known as:  NORCO/VICODIN  Take 1-2 tablets by mouth every 4 (four) hours as needed for moderate pain.     multivitamin with minerals tablet  Take 1 tablet by mouth daily.     nitroGLYCERIN 0.4 MG/SPRAY spray  Commonly known as:  NITROLINGUAL  Place 1 spray under the tongue every 5 (five) minutes x 3 doses as needed for chest pain.     tamsulosin 0.4 MG Caps capsule  Commonly known as:  FLOMAX  Take 0.4 mg by mouth daily.     triamterene-hydrochlorothiazide 37.5-25 MG per tablet  Commonly known as:  MAXZIDE-25  Take 1 tablet by mouth daily.     URIBEL 118 MG Caps  Take 1 capsule by mouth as needed (urinary  burning sensations).     vitamin C 250 MG tablet  Commonly known as:  ASCORBIC ACID  Take 250 mg by mouth daily.       Follow-up Information    Follow up with [facility] On 11/02/2014.   Specialty:  Cardiology   Why:  at 3:00pm for pacer wound check.     Contact information:   [address] [place] [phone]      Follow up with [name], MD.   Specialty:  Cardiology   Why:  the office will call with date and time    Contact information:   [address]  [place] [address] [phone]        Discharge Instructions: see pacemaker instructions  Heart Healthy low sugar diet.  You have elevated glucose-sugar.  follow up up with your PCP for management.  Your BP is elevated, will recheck as outpt. And we may need to add meds.  Signed: Isaiah Serge Nurse Practitioner-Certified [facility]: [facility] 10/25/2014, 12:27 PM  Time spent on discharge :  >30 minutes.      [name] MD

## 2014-11-01 ENCOUNTER — Ambulatory Visit (INDEPENDENT_AMBULATORY_CARE_PROVIDER_SITE_OTHER): Payer: Medicare Other | Admitting: Cardiovascular Disease

## 2014-11-01 VITALS — BP 124/78 | HR 82 | Ht 69.0 in | Wt 227.6 lb

## 2014-11-01 DIAGNOSIS — I10 Essential (primary) hypertension: Secondary | ICD-10-CM | POA: Diagnosis not present

## 2014-11-01 DIAGNOSIS — I442 Atrioventricular block, complete: Secondary | ICD-10-CM | POA: Diagnosis not present

## 2014-11-01 NOTE — Patient Instructions (Signed)
Your physician has requested that you have en exercise stress myoview and office visit in 6 months.

## 2014-11-02 ENCOUNTER — Ambulatory Visit (INDEPENDENT_AMBULATORY_CARE_PROVIDER_SITE_OTHER): Payer: Medicare Other | Admitting: *Deleted

## 2014-11-02 DIAGNOSIS — I442 Atrioventricular block, complete: Secondary | ICD-10-CM

## 2014-11-02 LAB — MDC_IDC_ENUM_SESS_TYPE_INCLINIC
Battery Impedance: 100 Ohm
Battery Remaining Longevity: 118 mo
Battery Voltage: 2.8 V
Brady Statistic AP VP Percent: 18 %
Brady Statistic AS VP Percent: 82 %
Date Time Interrogation Session: 20160407153722
Lead Channel Impedance Value: 531 Ohm
Lead Channel Impedance Value: 632 Ohm
Lead Channel Pacing Threshold Pulse Width: 0.4 ms
Lead Channel Sensing Intrinsic Amplitude: 4 mV
Lead Channel Setting Pacing Amplitude: 3.5 V
Lead Channel Setting Pacing Amplitude: 3.5 V
MDC IDC MSMT LEADCHNL RA PACING THRESHOLD AMPLITUDE: 1 V
MDC IDC MSMT LEADCHNL RV PACING THRESHOLD AMPLITUDE: 0.5 V
MDC IDC MSMT LEADCHNL RV PACING THRESHOLD PULSEWIDTH: 0.4 ms
MDC IDC MSMT LEADCHNL RV SENSING INTR AMPL: 5.6 mV
MDC IDC SET LEADCHNL RV PACING PULSEWIDTH: 0.4 ms
MDC IDC SET LEADCHNL RV SENSING SENSITIVITY: 2.8 mV
MDC IDC STAT BRADY AP VS PERCENT: 0 %
MDC IDC STAT BRADY AS VS PERCENT: 0 %

## 2014-11-02 NOTE — Progress Notes (Signed)
Wound check appointment. Steri-strips removed. Wound without redness or edema. Incision edges approximated, wound well healed. Normal device function. Thresholds, sensing, and impedances consistent with implant measurements. Device programmed at 3.5V/auto capture programmed on for extra safety margin until 3 month visit. Histogram distribution appropriate for patient and level of activity. 2 mode switches--both less than 1 minute. No high ventricular rates noted. Patient educated about wound care, arm mobility, lifting restrictions. Pt enrolled in [facility]--instructed to send transmission and call with any questions.  ROV in 3 months with JA.

## 2014-11-03 ENCOUNTER — Encounter: Payer: Self-pay | Admitting: Cardiovascular Disease

## 2014-11-03 NOTE — Progress Notes (Signed)
Patient ID: David [name], male   DOB: 04/26/1936, 79 y.o.   MRN: [id number]     HPI: David [name] is a 79 y.o. male who presents for one year cardiology evaluation.  David [name] has known CAD and in 2003 underwent stenting of his LAD by Dr. Glade Lloyd. Additional problems include hypertension, hyperlipidemia, and chronic left bundle branch block. His last echo Doppler study in May 2012 showed mild concentric LVH with an ejection fraction of 50-55%. There was grade 1 diastolic dysfunction the mitral valve E/A ratio of 0.62. There was mild aortic sclerosis with mild aortic insufficiency, mild mitral annular calcification with trace MR, mild TR, mild left atrial dilatation. His last nuclear perfusion study in May 2012 showed mild diaphragmatic attenuation without ischemia.  The patient was scheduled to undergo a one-year follow-up nuclear perfusion study for 10/24/2014.  Upon arrival he was noted to be bradycardic with pulse in the 40s and was felt to be in complete heart block.  He underwent rest images which showed mild decreased uptake at the apex with inferoapical thinning.  Stress images were not performed.  Due to complete heart block and since he was nothing by mouth he was referred to the hospital in that same day underwent permanent pacemaker insertion by Dr. Rayann Heman with a Medtronic pacemaker.  The patient admits to feeling improved since his pacemaker.  He previously had experienced significant fatigue and decreased energy.  He denies any recent chest pain.  He does have a history of hyperlipidemia for which she is on Vytorin 10/40.  He presents for evaluation.  Past Medical History  Diagnosis Date  . Hyperlipidemia   . Cancer     skin  . CAD (coronary artery disease)     previous stent to LAD 2003  . LBBB (left bundle branch block)   . Hypertension   . S/P cardiac pacemaker procedure, placement 10/24/14 Medtronic Adapta L model ADDRL 1  10/25/2014    Past Surgical  History  Procedure Laterality Date  . Tonsillectomy    . Coronary angioplasty with stent placement  03/01/2002    stenting to LAD - Dr. Glade Lloyd  . US echocardiography  12/05/2010    proximal septal thickening,mild concentric LVH,mild deptal hypokinesis,RV mildly dilated,LA mildly dilated,mild AI,moderate aortic root dilatation,septal motion c/w conduction abnormality  . Nm myocar perf wall motion  11/27/2010    normal  . Permanent pacemaker insertion N/A 10/24/2014    Procedure: PERMANENT PACEMAKER INSERTION;  Surgeon: [name], MD;  Location: [facility] CATH LAB;  Service: Cardiovascular;  Laterality: N/A;    Allergies  Allergen Reactions  . Gris-Peg [Griseofulvin] Other (See Comments)    headaches    Current Outpatient Prescriptions  Medication Sig Dispense Refill  . aspirin EC 81 MG tablet Take 81 mg by mouth daily.     Marland Kitchen ezetimibe-simvastatin (VYTORIN) 10-40 MG per tablet Take 1 tablet by mouth at bedtime.    . fish oil-omega-3 fatty acids 1000 MG capsule Take 2 g by mouth daily.    Marland Kitchen glucosamine-chondroitin 500-400 MG tablet Take 2 tablets by mouth daily.     . Meth-Hyo-M Bl-Na Phos-Ph Sal (URIBEL) 118 MG CAPS Take 1 capsule by mouth as needed (urinary burning sensations).     . Multiple Vitamins-Minerals (MULTIVITAMIN WITH MINERALS) tablet Take 1 tablet by mouth daily.    . nitroGLYCERIN (NITROLINGUAL) 0.4 MG/SPRAY spray Place 1 spray under the tongue every 5 (five) minutes x 3 doses as needed for chest pain. 12 g  12  . tamsulosin (FLOMAX) 0.4 MG CAPS capsule Take 0.4 mg by mouth daily.    Marland Kitchen triamterene-hydrochlorothiazide (MAXZIDE-25) 37.5-25 MG per tablet Take 1 tablet by mouth daily.    . vitamin C (ASCORBIC ACID) 250 MG tablet Take 250 mg by mouth daily.     No current facility-administered medications for this visit.    History   Social History  . Marital Status: Married    Spouse Name: N/A  . Number of Children: N/A  . Years of Education: N/A   Occupational History  .  Not on file.   Social History Main Topics  . Smoking status: Former Smoker    Types: Cigarettes, Cigars    Quit date: 10/27/1982  . Smokeless tobacco: Never Used  . Alcohol Use: Yes     Comment: 1-2 drink per week  . Drug Use: No  . Sexual Activity: Not on file   Other Topics Concern  . Not on file   Social History Narrative    Family History  Problem Relation Age of Onset  . Cancer Mother     breast  . Cancer Father     colon/lung    ROS General: Negative; No fevers, chills, or night sweats;  HEENT: Negative; No changes in vision or hearing, sinus congestion, difficulty swallowing Pulmonary: Negative; No cough, wheezing, shortness of breath, hemoptysis Cardiovascular: See history of present illness GI: Negative; No nausea, vomiting, diarrhea, or abdominal pain GU: Negative; No dysuria, hematuria, or difficulty voiding Musculoskeletal: Negative; no myalgias, joint pain, or weakness Hematologic/Oncology: Negative; no easy bruising, bleeding Endocrine: Negative; no heat/cold intolerance; no diabetes Neuro: Negative; no changes in balance, headaches Skin: Negative; No rashes or skin lesions Psychiatric: Negative; No behavioral problems, depression Sleep: Negative; No snoring, daytime sleepiness, hypersomnolence, bruxism, restless legs, hypnogognic hallucinations, no cataplexy Other comprehensive 14 point system review is negative.   PE BP 124/78 mmHg  Pulse 82  Ht '5\' 9"'  (1.753 m)  Wt 227 lb 9.6 oz (103.239 kg)  BMI 33.60 kg/m2  General: Alert, oriented, no distress.  Skin: normal turgor, no rashes HEENT: Normocephalic, atraumatic. Pupils round and reactive; sclera anicteric;no lid lag. Extraocular muscles intact;; no xanthelasmas. Nose without nasal septal hypertrophy Mouth/Parynx benign; Mallinpatti scale 2 Neck: No JVD, no carotid bruits; normal carotid upstroke Lungs: clear to ausculatation and percussion; no wheezing or rales Chest wall: no tenderness to  palpitation Heart: RRR, s1 s2 normal; 1/6 systolic murmur no diastolic murmur, rub thrills or heaves Abdomen: Mild central adiposity soft, nontender; no hepatosplenomehaly, BS+; abdominal aorta nontender and not dilated by palpation. Back: no CVA tenderness Pulses 2+ Extremities: no clubbing cyanosis or edema, Homan's sign negative  Neurologic: grossly nonfocal; cranial nerves grossly normal. Psychologic: normal affect and mood.  ECG sees independently read by me) atrial sensing and ventricular pacing at 82 bpm.  10/26/2013 ECG (independently read by me): Normal sinus rhythm at 78 beats per minute. Left bundle branch block.  LABS:  BMET  BMP Latest Ref Rng 10/25/2014 10/24/2014  Glucose 70 - 99 mg/dL 86 104(H)  BUN 6 - 23 mg/dL 13 16  Creatinine 0.50 - 1.35 mg/dL 0.83 0.80  Sodium 135 - 145 mmol/L 139 138  Potassium 3.5 - 5.1 mmol/L 3.4(L) 3.4(L)  Chloride 96 - 112 mmol/L 104 104  CO2 19 - 32 mmol/L 31 30  Calcium 8.4 - 10.5 mg/dL 8.3(L) 8.5     Hepatic Function Panel   Hepatic Function Latest Ref Rng 10/24/2014  Total Protein 6.0 -  8.3 g/dL 6.4  Albumin 3.5 - 5.2 g/dL 3.7  AST 0 - 37 U/L 25  ALT 0 - 53 U/L 23  Alk Phosphatase 39 - 117 U/L 54  Total Bilirubin 0.3 - 1.2 mg/dL 1.7(H)    CBC  CBC Latest Ref Rng 10/24/2014  WBC 4.0 - 10.5 K/uL 6.6  Hemoglobin 13.0 - 17.0 g/dL 15.0  Hematocrit 39.0 - 52.0 % 44.5  Platelets 150 - 400 K/uL 194     BNP No results found for: PROBNP  Lipid Panel  No results found for: CHOL   RADIOLOGY: No results found.    ASSESSMENT AND PLAN:  David [name] is a 79 year-old gentleman who is 13 years status post stenting of his LAD by Dr. Glade Lloyd.  His last nuclear perfusion study was in 2012.  He recently has noticed some vague fatigability.  He presented for a four-year follow-up nuclear study was found to be in complete heart block with pulse in the low 40s.  He is now status post permanent pacemaker by Dr. Rayann Heman.  He will  be seeing Dr. Rayann Heman next week. Mr. Kadrmas denies any recent anginal type symptoms.  He has more energy since his pacemaker has been inserted.  He denies presyncope or syncope.  He is unaware of palpitations.  I reviewed his hospital record.  Since he is currently stable, I will defer doing the completion of his nuclear study.  In 6 months we will reschedule him for a follow-up nuclear scan in light of his CAD.  I will try to get the laboratory.  The may have been done by Dr. Shelia Media.  Target LDL is less than 70 in this patient with CAD.  He seems to be tolerating Vytorin 10/40 without side effects.  His blood pressure today is stable.  There is no edema on his Maxide.  I will see him in 6 months for reevaluation or sooner if problem arise.  [name], MD, [facility]  11/03/2014 3:37 PM

## 2014-11-15 ENCOUNTER — Encounter: Payer: Self-pay | Admitting: Internal Medicine

## 2015-02-05 ENCOUNTER — Encounter: Payer: Self-pay | Admitting: *Deleted

## 2015-02-05 ENCOUNTER — Encounter: Payer: Self-pay | Admitting: Internal Medicine

## 2015-02-05 ENCOUNTER — Ambulatory Visit (INDEPENDENT_AMBULATORY_CARE_PROVIDER_SITE_OTHER): Payer: Medicare Other | Admitting: Internal Medicine

## 2015-02-05 VITALS — BP 134/80 | HR 77 | Ht 69.0 in | Wt 223.0 lb

## 2015-02-05 DIAGNOSIS — I442 Atrioventricular block, complete: Secondary | ICD-10-CM | POA: Diagnosis not present

## 2015-02-05 LAB — CUP PACEART INCLINIC DEVICE CHECK
Battery Remaining Longevity: 138 mo
Battery Voltage: 2.79 V
Brady Statistic AP VP Percent: 5 %
Brady Statistic AP VS Percent: 0 %
Date Time Interrogation Session: 20160711102729
Lead Channel Impedance Value: 521 Ohm
Lead Channel Impedance Value: 619 Ohm
Lead Channel Pacing Threshold Pulse Width: 0.4 ms
Lead Channel Sensing Intrinsic Amplitude: 11.2 mV
Lead Channel Sensing Intrinsic Amplitude: 5.6 mV
Lead Channel Setting Pacing Amplitude: 2 V
Lead Channel Setting Pacing Amplitude: 2.5 V
Lead Channel Setting Sensing Sensitivity: 2.8 mV
MDC IDC MSMT BATTERY IMPEDANCE: 100 Ohm
MDC IDC MSMT LEADCHNL RA PACING THRESHOLD AMPLITUDE: 1 V
MDC IDC MSMT LEADCHNL RA PACING THRESHOLD PULSEWIDTH: 0.4 ms
MDC IDC MSMT LEADCHNL RV PACING THRESHOLD AMPLITUDE: 0.75 V
MDC IDC SET LEADCHNL RV PACING PULSEWIDTH: 0.4 ms
MDC IDC STAT BRADY AS VP PERCENT: 95 %
MDC IDC STAT BRADY AS VS PERCENT: 0 %
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
## 2015-02-05 NOTE — Patient Instructions (Signed)
Medication Instructions: - no changes  Labwork: - none  Procedures/Testing: - none  Follow-Up: - Remote monitoring is used to monitor your Pacemaker of ICD from home. This monitoring reduces the number of office visits required to check your device to one time per year. It allows [place] to keep an eye on the functioning of your device to ensure it is working properly. You are scheduled for a device check from home on 05/07/15. You may send your transmission at any time that day. If you have a wireless device, the transmission will be sent automatically. After your physician reviews your transmission, you will receive a postcard with your next transmission date.  - Your physician wants you to follow-up in: 1 year with David Marshall, NP for Dr. Rayann Heman. You will receive a reminder letter in the mail two months in advance. If you don't receive a letter, please call our office to schedule the follow-up appointment.  Any Additional Special Instructions Will Be Listed Below (If Applicable). - none

## 2015-03-07 ENCOUNTER — Telehealth: Payer: Self-pay | Admitting: *Deleted

## 2015-03-07 NOTE — Telephone Encounter (Signed)
Called patient to notify him that a new Carelink Smart monitor was ordered as he misplaced his original monitor.  Patient voices understanding and states he will call when his new monitor arrives if he needs help setting it up.

## 2015-03-26 ENCOUNTER — Telehealth: Payer: Self-pay | Admitting: *Deleted

## 2015-03-26 NOTE — Telephone Encounter (Signed)
Patient called requesting help with troubleshooting his Carelink monitor.  Reviewed message that popped up on his monitor, but was unable to clarify the error code.  Gave [employer] tech support's phone number to the patient, who states that he will call them this afternoon to troubleshoot.  Patient denies any additional questions or concerns, and is aware to call with any worsening symptoms.

## 2015-04-03 ENCOUNTER — Encounter (HOSPITAL_COMMUNITY): Payer: Medicare Other

## 2015-04-04 ENCOUNTER — Telehealth (HOSPITAL_COMMUNITY): Payer: Self-pay

## 2015-04-04 NOTE — Telephone Encounter (Signed)
Encounter complete. 

## 2015-04-06 ENCOUNTER — Ambulatory Visit (HOSPITAL_COMMUNITY)
Admission: RE | Admit: 2015-04-06 | Discharge: 2015-04-06 | Disposition: A | Discharge status: Home / Self Care | Payer: Medicare Other | Source: Ambulatory Visit | Attending: Cardiovascular Disease | Admitting: Cardiovascular Disease

## 2015-04-06 DIAGNOSIS — I442 Atrioventricular block, complete: Secondary | ICD-10-CM

## 2015-04-06 DIAGNOSIS — R9439 Abnormal result of other cardiovascular function study: Secondary | ICD-10-CM | POA: Diagnosis not present

## 2015-04-06 DIAGNOSIS — I10 Essential (primary) hypertension: Secondary | ICD-10-CM | POA: Insufficient documentation

## 2015-04-06 DIAGNOSIS — F17200 Nicotine dependence, unspecified, uncomplicated: Secondary | ICD-10-CM | POA: Insufficient documentation

## 2015-04-06 DIAGNOSIS — I517 Cardiomegaly: Secondary | ICD-10-CM | POA: Diagnosis not present

## 2015-04-06 LAB — MYOCARDIAL PERFUSION IMAGING
CSEPPHR: 81 {beats}/min
LV dias vol: 164 mL
LVSYSVOL: 94 mL
Rest HR: 71 {beats}/min
SDS: 1
SRS: 0
SSS: 1
TID: 1.06

## 2015-04-06 MED ORDER — TECHNETIUM TC 99M SESTAMIBI GENERIC - CARDIOLITE
29.5000 | Freq: Once | INTRAVENOUS | Status: AC | PRN
  Administered 2015-04-06: 29.5 via INTRAVENOUS

## 2015-04-06 MED ORDER — REGADENOSON 0.4 MG/5ML IV SOLN
0.4000 mg | Freq: Once | INTRAVENOUS | Status: AC
  Administered 2015-04-06: 0.4 mg via INTRAVENOUS

## 2015-04-06 MED ORDER — TECHNETIUM TC 99M SESTAMIBI GENERIC - CARDIOLITE
10.7000 | Freq: Once | INTRAVENOUS | Status: AC | PRN
  Administered 2015-04-06: 10.7 via INTRAVENOUS

## 2015-04-13 ENCOUNTER — Encounter: Payer: Self-pay | Admitting: *Deleted

## 2015-05-03 ENCOUNTER — Encounter: Payer: Self-pay | Admitting: Internal Medicine

## 2015-05-03 ENCOUNTER — Ambulatory Visit (INDEPENDENT_AMBULATORY_CARE_PROVIDER_SITE_OTHER): Payer: Medicare Other | Admitting: *Deleted

## 2015-05-03 DIAGNOSIS — I442 Atrioventricular block, complete: Secondary | ICD-10-CM | POA: Diagnosis not present

## 2015-05-04 NOTE — Progress Notes (Signed)
Remote pacemaker transmission.   

## 2015-05-21 LAB — CUP PACEART REMOTE DEVICE CHECK
Battery Impedance: 100 Ohm
Battery Remaining Longevity: 137 mo
Brady Statistic AP VP Percent: 8 %
Date Time Interrogation Session: 20161006142037
Implantable Lead Implant Date: 20160329
Implantable Lead Location: 753859
Lead Channel Impedance Value: 507 Ohm
Lead Channel Pacing Threshold Amplitude: 0.625 V
Lead Channel Sensing Intrinsic Amplitude: 2.8 mV
Lead Channel Setting Pacing Pulse Width: 0.4 ms
MDC IDC LEAD IMPLANT DT: 20160329
MDC IDC LEAD LOCATION: 753860
MDC IDC MSMT BATTERY VOLTAGE: 2.78 V
MDC IDC MSMT LEADCHNL RA PACING THRESHOLD AMPLITUDE: 0.75 V
MDC IDC MSMT LEADCHNL RA PACING THRESHOLD PULSEWIDTH: 0.4 ms
MDC IDC MSMT LEADCHNL RV IMPEDANCE VALUE: 599 Ohm
MDC IDC MSMT LEADCHNL RV PACING THRESHOLD PULSEWIDTH: 0.4 ms
MDC IDC SET LEADCHNL RA PACING AMPLITUDE: 2 V
MDC IDC SET LEADCHNL RV PACING AMPLITUDE: 2.5 V
MDC IDC SET LEADCHNL RV SENSING SENSITIVITY: 2.8 mV
MDC IDC STAT BRADY AP VS PERCENT: 0 %
MDC IDC STAT BRADY AS VP PERCENT: 92 %
MDC IDC STAT BRADY AS VS PERCENT: 0 %

## 2015-06-07 ENCOUNTER — Encounter: Payer: Self-pay | Admitting: *Deleted

## 2015-08-02 ENCOUNTER — Ambulatory Visit: Payer: Medicare Other | Admitting: *Deleted

## 2015-08-02 ENCOUNTER — Telehealth: Payer: Self-pay | Admitting: Cardiology

## 2015-08-02 NOTE — Progress Notes (Signed)
Remote pacemaker transmission.   

## 2015-08-02 NOTE — Telephone Encounter (Signed)
Spoke with pt and reminded pt of remote transmission that is due today. Pt verbalized understanding.   

## 2015-08-03 ENCOUNTER — Encounter: Payer: Self-pay | Admitting: Cardiology

## 2015-08-29 ENCOUNTER — Ambulatory Visit (INDEPENDENT_AMBULATORY_CARE_PROVIDER_SITE_OTHER): Payer: Medicare Other | Admitting: *Deleted

## 2015-08-29 DIAGNOSIS — I442 Atrioventricular block, complete: Secondary | ICD-10-CM

## 2015-09-05 NOTE — Progress Notes (Signed)
Remote pacemaker transmission.   

## 2015-09-14 ENCOUNTER — Encounter: Payer: Self-pay | Admitting: Cardiology

## 2015-09-14 LAB — CUP PACEART REMOTE DEVICE CHECK
Battery Impedance: 100 Ohm
Battery Remaining Longevity: 138 mo
Battery Voltage: 2.79 V
Brady Statistic AP VP Percent: 5 %
Brady Statistic AP VS Percent: 0 %
Date Time Interrogation Session: 20170201202507
Implantable Lead Implant Date: 20160329
Implantable Lead Location: 753859
Implantable Lead Model: 5076
Lead Channel Impedance Value: 515 Ohm
Lead Channel Sensing Intrinsic Amplitude: 2.8 mV
Lead Channel Setting Pacing Amplitude: 2.5 V
Lead Channel Setting Pacing Pulse Width: 0.4 ms
MDC IDC LEAD IMPLANT DT: 20160329
MDC IDC LEAD LOCATION: 753860
MDC IDC MSMT LEADCHNL RA PACING THRESHOLD AMPLITUDE: 0.75 V
MDC IDC MSMT LEADCHNL RA PACING THRESHOLD PULSEWIDTH: 0.4 ms
MDC IDC MSMT LEADCHNL RV IMPEDANCE VALUE: 632 Ohm
MDC IDC MSMT LEADCHNL RV PACING THRESHOLD AMPLITUDE: 0.5 V
MDC IDC MSMT LEADCHNL RV PACING THRESHOLD PULSEWIDTH: 0.4 ms
MDC IDC SET LEADCHNL RA PACING AMPLITUDE: 2 V
MDC IDC SET LEADCHNL RV SENSING SENSITIVITY: 2.8 mV
MDC IDC STAT BRADY AS VP PERCENT: 95 %
MDC IDC STAT BRADY AS VS PERCENT: 0 %

## 2015-09-18 ENCOUNTER — Telehealth: Payer: Self-pay | Admitting: Internal Medicine

## 2015-09-18 NOTE — Telephone Encounter (Signed)
NeW MEssage  Pt stated- did not get results from last remote check. Please call back and discuss.

## 2015-09-19 NOTE — Telephone Encounter (Signed)
Spoke w/ and informed him that I mailed his letter on 09/14/15 and that his remote was normal. Pt verbalized understanding.

## 2015-10-02 DIAGNOSIS — H6983 Other specified disorders of Eustachian tube, bilateral: Secondary | ICD-10-CM | POA: Diagnosis not present

## 2015-10-02 DIAGNOSIS — H7202 Central perforation of tympanic membrane, left ear: Secondary | ICD-10-CM | POA: Diagnosis not present

## 2015-10-02 DIAGNOSIS — H903 Sensorineural hearing loss, bilateral: Secondary | ICD-10-CM | POA: Diagnosis not present

## 2015-10-11 ENCOUNTER — Ambulatory Visit: Payer: Medicare Other | Admitting: Cardiovascular Disease

## 2015-10-16 DIAGNOSIS — Z125 Encounter for screening for malignant neoplasm of prostate: Secondary | ICD-10-CM | POA: Diagnosis not present

## 2015-10-16 DIAGNOSIS — E7800 Pure hypercholesterolemia, unspecified: Secondary | ICD-10-CM | POA: Diagnosis not present

## 2015-10-16 DIAGNOSIS — I10 Essential (primary) hypertension: Secondary | ICD-10-CM | POA: Diagnosis not present

## 2015-10-16 DIAGNOSIS — E119 Type 2 diabetes mellitus without complications: Secondary | ICD-10-CM | POA: Diagnosis not present

## 2015-10-16 DIAGNOSIS — Z0000 Encounter for general adult medical examination without abnormal findings: Secondary | ICD-10-CM | POA: Diagnosis not present

## 2015-10-29 DIAGNOSIS — E119 Type 2 diabetes mellitus without complications: Secondary | ICD-10-CM | POA: Diagnosis not present

## 2015-10-29 DIAGNOSIS — Z1212 Encounter for screening for malignant neoplasm of rectum: Secondary | ICD-10-CM | POA: Diagnosis not present

## 2015-10-29 DIAGNOSIS — Z0000 Encounter for general adult medical examination without abnormal findings: Secondary | ICD-10-CM | POA: Diagnosis not present

## 2015-10-29 DIAGNOSIS — G4733 Obstructive sleep apnea (adult) (pediatric): Secondary | ICD-10-CM | POA: Diagnosis not present

## 2015-10-29 DIAGNOSIS — I2510 Atherosclerotic heart disease of native coronary artery without angina pectoris: Secondary | ICD-10-CM | POA: Diagnosis not present

## 2015-10-30 ENCOUNTER — Telehealth: Payer: Self-pay | Admitting: Cardiovascular Disease

## 2015-10-30 NOTE — Telephone Encounter (Signed)
Received records from [facility] for appointment on 11/01/15 with Dr Claiborne Billings.  Records given to [facility] (medical records) for Dr Evette Georges schedule on 11/01/15. lp

## 2015-11-01 ENCOUNTER — Encounter: Payer: Self-pay | Admitting: Cardiovascular Disease

## 2015-11-01 ENCOUNTER — Ambulatory Visit (INDEPENDENT_AMBULATORY_CARE_PROVIDER_SITE_OTHER): Payer: Medicare Other | Admitting: Cardiovascular Disease

## 2015-11-01 VITALS — BP 124/72 | HR 77 | Ht 69.0 in | Wt 224.0 lb

## 2015-11-01 DIAGNOSIS — I2510 Atherosclerotic heart disease of native coronary artery without angina pectoris: Secondary | ICD-10-CM | POA: Diagnosis not present

## 2015-11-01 DIAGNOSIS — G4730 Sleep apnea, unspecified: Secondary | ICD-10-CM

## 2015-11-01 DIAGNOSIS — I10 Essential (primary) hypertension: Secondary | ICD-10-CM

## 2015-11-01 DIAGNOSIS — G4733 Obstructive sleep apnea (adult) (pediatric): Secondary | ICD-10-CM

## 2015-11-01 DIAGNOSIS — E785 Hyperlipidemia, unspecified: Secondary | ICD-10-CM

## 2015-11-01 DIAGNOSIS — H2513 Age-related nuclear cataract, bilateral: Secondary | ICD-10-CM | POA: Diagnosis not present

## 2015-11-01 NOTE — Patient Instructions (Addendum)
Your physician has recommended that you have a sleep study. This test records several body functions during sleep, including: brain activity, eye movement, oxygen and carbon dioxide blood levels, heart rate and rhythm, breathing rate and rhythm, the flow of air through your mouth and nose, snoring, body muscle movements, and chest and belly movement.  Your physician recommends that you schedule a follow-up appointment in: September 2017 sleep clinic  Your physician recommends that you schedule a follow-up appointment in: May with Dr Rayann Heman.

## 2015-11-03 ENCOUNTER — Encounter: Payer: Self-pay | Admitting: Cardiovascular Disease

## 2015-11-03 DIAGNOSIS — G4733 Obstructive sleep apnea (adult) (pediatric): Secondary | ICD-10-CM | POA: Insufficient documentation

## 2015-11-03 DIAGNOSIS — I2510 Atherosclerotic heart disease of native coronary artery without angina pectoris: Secondary | ICD-10-CM | POA: Insufficient documentation

## 2015-11-03 NOTE — Progress Notes (Signed)
Patient ID: David [name], male   DOB: May 26, 1936, 80 y.o.   MRN: [id number]    Primary M.D.: Dr. [name]  HPI: David [name] is a 80 y.o. male who presents for one year cardiology evaluation.  David [name] has known CAD and in 2003 underwent stenting of his LAD by Dr. Glade Lloyd. Additional problems include hypertension, hyperlipidemia, and chronic left bundle branch block. His last echo Doppler study in May 2012 showed mild concentric LVH with an ejection fraction of 50-55%. There was grade 1 diastolic dysfunction the mitral valve E/A ratio of 0.62. There was mild aortic sclerosis with mild aortic insufficiency, mild mitral annular calcification with trace MR, mild TR, mild left atrial dilatation. His last nuclear perfusion study in May 2012 showed mild diaphragmatic attenuation without ischemia.  He was scheduled to undergo a one-year follow-up nuclear perfusion study for 10/24/2014.  Upon arrival he was noted to be bradycardic with pulse in the 40s and was felt to be in complete heart block.  He underwent rest images which showed mild decreased uptake at the apex with inferoapical thinning.  Stress images were not performed.  Due to complete heart block and since he was nothing by mouth he was referred to the hospital in that same day underwent permanent pacemaker insertion by Dr. Rayann Heman with a Medtronic pacemaker.  The patient admits to feeling improved since his pacemaker.  He previously had experienced significant fatigue and decreased energy.  He denies any recent chest pain.  He does have a history of hyperlipidemia for which she is on Vytorin 10/40.    The patient was recently seen by Dr. Shelia Media.  He has a history of obstructive sleep apnea for many years but admits to not using CPAP therapy.  Her several years.  He admits to daytime fatigue and sleepiness.  He is typically going to bed around 10 to 10:30 PM and waking up between 5 and 5:30 AM.  His sleep is fragmented.  He  is scheduled for a pacemaker interrogation in May 2017.  He presents for cardiology evaluation.  Past Medical History  Diagnosis Date  . Hyperlipidemia   . Cancer ([place])     skin  . CAD (coronary artery disease)     previous stent to LAD 2003  . LBBB (left bundle branch block)   . Hypertension   . S/P cardiac pacemaker procedure, placement 10/24/14 Medtronic Adapta L model ADDRL 1  10/25/2014    Past Surgical History  Procedure Laterality Date  . Tonsillectomy    . Coronary angioplasty with stent placement  03/01/2002    stenting to LAD - Dr. Glade Lloyd  . US echocardiography  12/05/2010    proximal septal thickening,mild concentric LVH,mild deptal hypokinesis,RV mildly dilated,LA mildly dilated,mild AI,moderate aortic root dilatation,septal motion c/w conduction abnormality  . Nm myocar perf wall motion  11/27/2010    normal  . Permanent pacemaker insertion N/A 10/24/2014    Procedure: PERMANENT PACEMAKER INSERTION;  Surgeon: [name], MD;  Location: [facility] CATH LAB;  Service: Cardiovascular;  Laterality: N/A;    Allergies  Allergen Reactions  . Gris-Peg [Griseofulvin] Other (See Comments)    headaches    Current Outpatient Prescriptions  Medication Sig Dispense Refill  . aspirin EC 81 MG tablet Take 81 mg by mouth daily.     Marland Kitchen ezetimibe-simvastatin (VYTORIN) 10-40 MG per tablet Take 1 tablet by mouth at bedtime.    . fish oil-omega-3 fatty acids 1000 MG capsule Take 1 g by mouth  daily.     . glucosamine-chondroitin 500-400 MG tablet Take 1 tablet by mouth daily.     . Meth-Hyo-M Bl-Na Phos-Ph Sal (URIBEL) 118 MG CAPS Take 1 capsule by mouth daily as needed (urinary burning sensations).     . Multiple Vitamins-Minerals (MULTIVITAMIN WITH MINERALS) tablet Take 1 tablet by mouth daily.    . nitroGLYCERIN (NITROSTAT) 0.4 MG SL tablet Place 0.4 mg under the tongue every 5 (five) minutes as needed for chest pain. Reported on 11/01/2015    . tamsulosin (FLOMAX) 0.4 MG CAPS capsule Take 0.4 mg  by mouth daily. Reported on 11/01/2015    . triamterene-hydrochlorothiazide (MAXZIDE-25) 37.5-25 MG per tablet Take 1 tablet by mouth daily.    . vitamin C (ASCORBIC ACID) 250 MG tablet Take 250 mg by mouth daily.     No current facility-administered medications for this visit.    Social History   Social History  . Marital Status: Married    Spouse Name: N/A  . Number of Children: N/A  . Years of Education: N/A   Occupational History  . Not on file.   Social History Main Topics  . Smoking status: Former Smoker    Types: Cigarettes, Cigars    Quit date: 10/27/1982  . Smokeless tobacco: Never Used  . Alcohol Use: Yes     Comment: 1-2 drink per week  . Drug Use: No  . Sexual Activity: Not on file   Other Topics Concern  . Not on file   Social History Narrative    Family History  Problem Relation Age of Onset  . Cancer Mother     breast  . Cancer Father     colon/lung    ROS General: Negative; No fevers, chills, or night sweats;  HEENT: Negative; No changes in vision or hearing, sinus congestion, difficulty swallowing Pulmonary: Negative; No cough, wheezing, shortness of breath, hemoptysis Cardiovascular: See history of present illness GI: Negative; No nausea, vomiting, diarrhea, or abdominal pain GU: Negative; No dysuria, hematuria, or difficulty voiding Musculoskeletal: Negative; no myalgias, joint pain, or weakness Hematologic/Oncology: Negative; no easy bruising, bleeding Endocrine: Negative; no heat/cold intolerance; no diabetes Neuro: Negative; no changes in balance, headaches Skin: Negative; No rashes or skin lesions Psychiatric: Negative; No behavioral problems, depression Sleep: Negative; No snoring, daytime sleepiness, hypersomnolence, bruxism, restless legs, hypnogognic hallucinations, no cataplexy Other comprehensive 14 point system review is negative.   PE BP 124/72 mmHg  Pulse 77  Ht _0  (1.753 m)  Wt 224 lb (101.606 kg)  BMI 33.06 kg/m2    Wt Readings from Last 3 Encounters:  11/01/15 224 lb (101.606 kg)  04/06/15 227 lb (102.967 kg)  02/05/15 223 lb (101.152 kg)   General: Alert, oriented, no distress.  Skin: normal turgor, no rashes HEENT: Normocephalic, atraumatic. Pupils round and reactive; sclera anicteric;no lid lag. Extraocular muscles intact;; no xanthelasmas. Nose without nasal septal hypertrophy Mouth/Parynx benign; Mallinpatti scale 2 Neck: No JVD, no carotid bruits; normal carotid upstroke Lungs: clear to ausculatation and percussion; no wheezing or rales Chest wall: no tenderness to palpitation Heart: RRR, s1 s2 normal; 1/6 systolic murmur no diastolic murmur, rub thrills or heaves Abdomen: Mild central adiposity soft, nontender; no hepatosplenomehaly, BS+; abdominal aorta nontender and not dilated by palpation. Back: no CVA tenderness Pulses 2+ Extremities: no clubbing cyanosis or edema, Homan's sign negative  Neurologic: grossly nonfocal; cranial nerves grossly normal. Psychologic: normal affect and mood.  ECG (independently read by me): Atrial sensing and ventricular pacing with rate of  77 bpm.  PR interval 218 ms.  11/01/2014  ECG sees independently read by me) atrial sensing and ventricular pacing at 82 bpm.  10/26/2013 ECG (independently read by me): Normal sinus rhythm at 78 beats per minute. Left bundle branch block.  LABS:  BMP Latest Ref Rng 10/25/2014 10/24/2014  Glucose 70 - 99 mg/dL 86 104(H)  BUN 6 - 23 mg/dL 13 16  Creatinine 0.50 - 1.35 mg/dL 0.83 0.80  Sodium 135 - 145 mmol/L 139 138  Potassium 3.5 - 5.1 mmol/L 3.4(L) 3.4(L)  Chloride 96 - 112 mmol/L 104 104  CO2 19 - 32 mmol/L 31 30  Calcium 8.4 - 10.5 mg/dL 8.3(L) 8.5   Hepatic Function Latest Ref Rng 10/24/2014  Total Protein 6.0 - 8.3 g/dL 6.4  Albumin 3.5 - 5.2 g/dL 3.7  AST 0 - 37 U/L 25  ALT 0 - 53 U/L 23  Alk Phosphatase 39 - 117 U/L 54  Total Bilirubin 0.3 - 1.2 mg/dL 1.7(H)   CBC Latest Ref Rng 10/24/2014  WBC 4.0  - 10.5 K/uL 6.6  Hemoglobin 13.0 - 17.0 g/dL 15.0  Hematocrit 39.0 - 52.0 % 44.5  Platelets 150 - 400 K/uL 194   Lab Results  Component Value Date   MCV 91.4 10/24/2014    Lab Results  Component Value Date   TSH 1.402 10/24/2014   Lab Results  Component Value Date   HGBA1C 6.6* 10/24/2014   Lipid Panel  No results found for: CHOL, TRIG, HDL, CHOLHDL, VLDL, LDLCALC, LDLDIRECT   RADIOLOGY: No results found.    ASSESSMENT AND PLAN: David [name] is a 80 year-old gentleman who is 14 years status post stenting of his LAD by Dr. Glade Lloyd.  His last nuclear perfusion study was in 2012.  He was found to be in complete heart block when he presented for his nuclear stress test last year and subsequently underwent insertion of a Medtronic permanent pacemaker on 10/25/2014 by Dr. Rayann Heman.  He has been undergoing telemetry assessment.  His blood pressure today is stable.  He is on Maxide does not have significant edema.  He is not having anginal symptoms with reference to his CAD.  He ultimately underwent the nuclear perfusion study in September 2016 , which was interpreted as low risk with an ejection fraction at 43% without ischemia.  Presently, he has untreated sleep apnea and stopped using CPAP for several years.  He admits to daytime fatigue and sleepiness.  I discussed with him new technology with reference to both the CPAP machines as well as masks.  I have strongly recommended reinitiation of therapy and have scheduled him to undergo a split night study reevaluation of his obstructive sleep apnea.  I discussed the adverse consequences of untreated sleep apnea with reference to his cardiovascular risk.  I will schedule him to see Dr. Rayann Heman in May for one-year follow-up evaluation of his pacemaker.  I will see him in follow-up of his sleep study and reinitiation of CPAP therapy for Medicare compliance documentation.   David Sine, MD, [facility]  11/03/2015 12:01 PM

## 2015-11-07 ENCOUNTER — Other Ambulatory Visit: Payer: Self-pay | Admitting: Cardiovascular Disease

## 2015-11-07 ENCOUNTER — Other Ambulatory Visit: Payer: Self-pay | Admitting: *Deleted

## 2015-11-07 MED ORDER — NITROGLYCERIN 0.4 MG SL SUBL: 0.4000 mg | SUBLINGUAL_TABLET | SUBLINGUAL | Status: DC | PRN

## 2015-11-07 NOTE — Telephone Encounter (Signed)
90 day supply of NTG refused. Per patient, he likes 90 day supply of all other meds, but does NOT need that many NTG tabs

## 2015-11-21 ENCOUNTER — Other Ambulatory Visit: Payer: Self-pay | Admitting: Cardiovascular Disease

## 2015-11-26 DIAGNOSIS — E7800 Pure hypercholesterolemia, unspecified: Secondary | ICD-10-CM | POA: Diagnosis not present

## 2015-11-26 DIAGNOSIS — E119 Type 2 diabetes mellitus without complications: Secondary | ICD-10-CM | POA: Diagnosis not present

## 2015-11-28 ENCOUNTER — Ambulatory Visit (INDEPENDENT_AMBULATORY_CARE_PROVIDER_SITE_OTHER): Payer: Medicare Other | Admitting: *Deleted

## 2015-11-28 DIAGNOSIS — I442 Atrioventricular block, complete: Secondary | ICD-10-CM

## 2015-11-29 NOTE — Progress Notes (Signed)
Remote pacemaker transmission.   

## 2015-11-30 DIAGNOSIS — N138 Other obstructive and reflux uropathy: Secondary | ICD-10-CM | POA: Diagnosis not present

## 2015-11-30 DIAGNOSIS — R3912 Poor urinary stream: Secondary | ICD-10-CM | POA: Diagnosis not present

## 2015-11-30 DIAGNOSIS — Z0000 Encounter for general adult medical examination without abnormal findings: Secondary | ICD-10-CM | POA: Diagnosis not present

## 2015-11-30 DIAGNOSIS — R351 Nocturia: Secondary | ICD-10-CM | POA: Diagnosis not present

## 2015-11-30 DIAGNOSIS — N401 Enlarged prostate with lower urinary tract symptoms: Secondary | ICD-10-CM | POA: Diagnosis not present

## 2015-12-03 ENCOUNTER — Ambulatory Visit (HOSPITAL_BASED_OUTPATIENT_CLINIC_OR_DEPARTMENT_OTHER): Payer: Medicare Other | Attending: Cardiovascular Disease | Admitting: Cardiovascular Disease

## 2015-12-03 DIAGNOSIS — G4761 Periodic limb movement disorder: Secondary | ICD-10-CM | POA: Insufficient documentation

## 2015-12-03 DIAGNOSIS — Z7982 Long term (current) use of aspirin: Secondary | ICD-10-CM | POA: Diagnosis not present

## 2015-12-03 DIAGNOSIS — G4733 Obstructive sleep apnea (adult) (pediatric): Secondary | ICD-10-CM | POA: Insufficient documentation

## 2015-12-03 DIAGNOSIS — R0683 Snoring: Secondary | ICD-10-CM | POA: Diagnosis not present

## 2015-12-03 DIAGNOSIS — Z79899 Other long term (current) drug therapy: Secondary | ICD-10-CM | POA: Insufficient documentation

## 2015-12-03 DIAGNOSIS — I10 Essential (primary) hypertension: Secondary | ICD-10-CM | POA: Insufficient documentation

## 2015-12-03 DIAGNOSIS — R5383 Other fatigue: Secondary | ICD-10-CM | POA: Diagnosis not present

## 2015-12-03 DIAGNOSIS — G4736 Sleep related hypoventilation in conditions classified elsewhere: Secondary | ICD-10-CM | POA: Insufficient documentation

## 2015-12-10 ENCOUNTER — Encounter (HOSPITAL_BASED_OUTPATIENT_CLINIC_OR_DEPARTMENT_OTHER): Payer: Self-pay | Admitting: Cardiovascular Disease

## 2015-12-10 NOTE — Procedures (Signed)
Patient Name: David [name], David [name] Date: 12/03/2015 Gender: Male D.O.B: 1935-12-02 Age (years): 10 Referring Provider: Shelva Majestic MD, ABSM Height (inches): 69 Interpreting Physician: Shelva Majestic MD, ABSM Weight (lbs): 220 RPSGT: Carolin Coy BMI: 32 MRN: [id number] Neck Size: 15.00  CLINICAL INFORMATION Sleep Study Type: Split Night CPAP Indication for sleep study: Fatigue, Hypertension, OSA, Snoring Epworth Sleepiness Score: 4  SLEEP STUDY TECHNIQUE As per the AASM Manual for the Scoring of Sleep and Associated Events v2.3 (April 2016) with a hypopnea requiring 4% desaturations. The channels recorded and monitored were frontal, central and occipital EEG, electrooculogram (EOG), submentalis EMG (chin), nasal and oral airflow, thoracic and abdominal wall motion, anterior tibialis EMG, snore microphone, electrocardiogram, and pulse oximetry. Continuous positive airway pressure (CPAP) was initiated when the patient met split night criteria and was titrated according to treat sleep-disordered breathing.  MEDICATIONS  aspirin EC 81 MG tablet 81 mg, Daily     ezetimibe-simvastatin (VYTORIN) 10-40 MG per tablet 1 tablet, Daily at bedtime     fish oil-omega-3 fatty acids 1000 MG capsule 1 g, Daily     glucosamine-chondroitin 500-400 MG tablet 1 tablet, Daily     Meth-Hyo-M Bl-Na Phos-Ph Sal (URIBEL) 118 MG CAPS 1 capsule, Daily PRN     Multiple Vitamins-Minerals (MULTIVITAMIN WITH MINERALS) tablet 1 tablet, Daily     nitroGLYCERIN (NITROSTAT) 0.4 MG SL tablet 0.4 mg, Every 5 min PRN     tamsulosin (FLOMAX) 0.4 MG CAPS capsule 0.4 mg, Daily     triamterene-hydrochlorothiazide (MAXZIDE-25) 37.5-25 MG per tablet 1 tablet, Daily     vitamin C (ASCORBIC ACID) 250 MG    Medications administered by patient during sleep study : No sleep medicine administered.  RESPIRATORY PARAMETERS Diagnostic Total AHI (/hr): 82.0 RDI (/hr): 82.4 OA Index (/hr): 8.9 CA Index (/hr): 0.0 REM  AHI (/hr): N/A NREM AHI (/hr): 82.0 Supine AHI (/hr): 97.4 Non-supine AHI (/hr): 71.20 Min O2 Sat (%): 64.00 Mean O2 (%): 83.86 Time below 88% (min): 89.1   Titration Optimal Pressure (cm):  AHI at Optimal Pressure (/hr): N/A Min O2 at Optimal Pressure (%): 74.00 Supine % at Optimal (%): N/A Sleep % at Optimal (%): N/A    SLEEP ARCHITECTURE The recording time for the entire night was 406.4 minutes. During a baseline period of 178.2 minutes, the patient slept for 127.4 minutes in REM and nonREM, yielding a sleep efficiency of 71.5%. Sleep onset after lights out was 14.3 minutes with a REM latency of N/A minutes. The patient spent 35.23% of the night in stage N1 sleep, 64.38% in stage N2 sleep, 0.39% in stage N3 and 0.00% in REM.  During the titration period of 227.3 minutes, the patient slept for 169.6 minutes in REM and nonREM, yielding a sleep efficiency of 74.6%. Sleep onset after CPAP initiation was 34.1 minutes with a REM latency of 66.5 minutes. The patient spent 8.63% of the night in stage N1 sleep, 60.13% in stage N2 sleep, 0.00% in stage N3 and 31.24% in REM.  CARDIAC DATA The 2 lead EKG demonstrated pacemaker generated. The mean heart rate was 64.58 beats per minute. Other EKG findings include: None.  LEG MOVEMENT DATA The total Periodic Limb Movements of Sleep (PLMS) were 122. The PLMS index was 24.64 .  IMPRESSIONS - Severe obstructive sleep apnea occurred during the diagnostic portion of the study (AHI = 82.0/hour). There was absence of REM sleep on the diagnostic portion of the study with REM rebound with CPAP therapy.  The patient  was titrated from 5 to 14 cm pressure. An optimal PAP pressure could not be selected for this patient based on the available study data. - No significant central sleep apnea occurred during the diagnostic portion of the study (CAI = 0.0/hour). - Severe oxygen desaturation  to a nadir of 64.00% on the diagnostic evaluation with a nadir of 74% with CPAP at  7 cm water pressure. Time below 88% was 89.1 minutes. - Moderate snoring volume . - No cardiac abnormalities were noted during this study. - Increase periodic limb movement of sleep with an index of 24.64 without leadin to arousal.  DIAGNOSIS - Obstructive Sleep Apnea (327.23 [G47.33 ICD-10]) - Nocturnal Hypoxemia (327.26 [G47.36 ICD-10])  RECOMMENDATIONS - Recommend an initial trial of CPAP Auto with A/Flex/EPR at 9 - 20 cm water pressure with heated humidification.  - Efforts should be made to optimize nasal and oropharyngeal patency. - Avoid alcohol, sedatives and other CNS depressants that may worsen sleep apnea and disrupt normal sleep architecture. - Sleep hygiene should be reviewed to assess factors that may improve sleep quality. - Weight management and regular exercise should be initiated. - Recommend a download in 30 days and sleep clinic evalyution.   [name], MD, [place], American Board of Sleep Medicine  ELECTRONICALLY SIGNED ON:  12/10/2015, 11:24 PM [place] PH: [phone]   FX: [phone] [place]

## 2015-12-17 ENCOUNTER — Encounter: Payer: Medicare Other | Admitting: Internal Medicine

## 2015-12-20 ENCOUNTER — Other Ambulatory Visit: Payer: Self-pay | Admitting: *Deleted

## 2015-12-20 ENCOUNTER — Telehealth: Payer: Self-pay | Admitting: *Deleted

## 2015-12-20 DIAGNOSIS — G4733 Obstructive sleep apnea (adult) (pediatric): Secondary | ICD-10-CM

## 2015-12-20 NOTE — Telephone Encounter (Signed)
Left message patient sleep study results and recommendations. Call back if questions. Referral placed to [facility].

## 2015-12-20 NOTE — Progress Notes (Signed)
Left message on patient's home number sleep study positive for sleep apnea. Referral for CPAP set up sent to [facility].

## 2015-12-20 NOTE — Telephone Encounter (Signed)
Follow Up   Pt called states that he received a missed call but not a message. He request a call back to discuss

## 2015-12-31 DIAGNOSIS — L565 Disseminated superficial actinic porokeratosis (DSAP): Secondary | ICD-10-CM | POA: Diagnosis not present

## 2015-12-31 DIAGNOSIS — D485 Neoplasm of uncertain behavior of skin: Secondary | ICD-10-CM | POA: Diagnosis not present

## 2015-12-31 DIAGNOSIS — L821 Other seborrheic keratosis: Secondary | ICD-10-CM | POA: Diagnosis not present

## 2015-12-31 DIAGNOSIS — Z85828 Personal history of other malignant neoplasm of skin: Secondary | ICD-10-CM | POA: Diagnosis not present

## 2015-12-31 DIAGNOSIS — L570 Actinic keratosis: Secondary | ICD-10-CM | POA: Diagnosis not present

## 2015-12-31 DIAGNOSIS — D1801 Hemangioma of skin and subcutaneous tissue: Secondary | ICD-10-CM | POA: Diagnosis not present

## 2016-01-01 ENCOUNTER — Telehealth: Payer: Self-pay | Admitting: Internal Medicine

## 2016-01-01 ENCOUNTER — Encounter: Payer: Self-pay | Admitting: Internal Medicine

## 2016-01-01 NOTE — Telephone Encounter (Signed)
Dr. Fenton Foy office- Peroidontist- is requesting an email to [email or web address] -stating whether patient can have the Cavatron ultra sonic tooth cleaner-was told previously due to pacemaker they couldn't use it-but pt has since told them that his primary called [place] and we gave ok to use-

## 2016-01-01 NOTE — Telephone Encounter (Signed)
Unable to reach receptionist. The following email was sent securely: To Whom It May Concern, [employer] can give specific recommendations for each patient regarding use of Cavitron Ultrasonic Cleaner on patient's with an ICD or PPM. Use the patient's device ID card for contact information. In our experience, we have been told that using an ultrasonic cleaner is appropriate, but to avoid draping the cord across the patient's chest near their implanted device. If the patient becomes symptomatic then to discontinue use of the ultrasonic cleaner. We have not been made aware of any complications with ultrasonic cleaners and device patients. Mr. David [name] (DOB [birth year]) has a pacemaker and at last check he was not device dependent. [employer]'s tech services # is [phone] if you have further questions. Debroah Loop, RN, BSN, CCDS [employer]

## 2016-01-04 ENCOUNTER — Encounter: Payer: Self-pay | Admitting: Cardiology

## 2016-01-07 LAB — CUP PACEART REMOTE DEVICE CHECK
Battery Remaining Longevity: 134 mo
Brady Statistic AP VP Percent: 5 %
Brady Statistic AS VS Percent: 1 %
Implantable Lead Implant Date: 20160329
Implantable Lead Location: 753859
Implantable Lead Model: 5092
Lead Channel Impedance Value: 617 Ohm
Lead Channel Pacing Threshold Amplitude: 0.875 V
Lead Channel Sensing Intrinsic Amplitude: 2.8 mV
Lead Channel Setting Pacing Amplitude: 2 V
Lead Channel Setting Pacing Pulse Width: 0.4 ms
MDC IDC LEAD IMPLANT DT: 20160329
MDC IDC LEAD LOCATION: 753860
MDC IDC MSMT BATTERY IMPEDANCE: 113 Ohm
MDC IDC MSMT BATTERY VOLTAGE: 2.78 V
MDC IDC MSMT LEADCHNL RA IMPEDANCE VALUE: 431 Ohm
MDC IDC MSMT LEADCHNL RA PACING THRESHOLD AMPLITUDE: 0.5 V
MDC IDC MSMT LEADCHNL RA PACING THRESHOLD PULSEWIDTH: 0.4 ms
MDC IDC MSMT LEADCHNL RV PACING THRESHOLD PULSEWIDTH: 0.4 ms
MDC IDC SESS DTM: 20170503143734
MDC IDC SET LEADCHNL RV PACING AMPLITUDE: 2.5 V
MDC IDC SET LEADCHNL RV SENSING SENSITIVITY: 2.8 mV
MDC IDC STAT BRADY AP VS PERCENT: 0 %
MDC IDC STAT BRADY AS VP PERCENT: 95 %

## 2016-01-10 ENCOUNTER — Encounter: Payer: Self-pay | Admitting: *Deleted

## 2016-01-10 ENCOUNTER — Telehealth: Payer: Self-pay | Admitting: Internal Medicine

## 2016-01-10 NOTE — Telephone Encounter (Signed)
Receptionist unavailable. LMOM regarding David [name] and use of ultrasonic cleaner with PPM. OK to use cleaner, avoid draping cord across chest near device and cease use of cleaner if patient was to become symptomatic. We are happy to complete a form if faxed to [place]. Call back with questions.

## 2016-01-10 NOTE — Telephone Encounter (Signed)
Follow-up    The dentist office is calling back to get something faxed back to them about the pt using a Cavitron Ultrasonic Cleaner for a cleaning, the receptionist is calling back to get a response.  Please fax the information to [phone], The pt has a pacemaker

## 2016-01-21 ENCOUNTER — Encounter: Payer: Self-pay | Admitting: Internal Medicine

## 2016-01-21 ENCOUNTER — Ambulatory Visit (INDEPENDENT_AMBULATORY_CARE_PROVIDER_SITE_OTHER): Payer: Medicare Other | Admitting: Internal Medicine

## 2016-01-21 VITALS — BP 128/72 | HR 105 | Ht 69.0 in | Wt 224.0 lb

## 2016-01-21 DIAGNOSIS — I442 Atrioventricular block, complete: Secondary | ICD-10-CM | POA: Diagnosis not present

## 2016-01-21 DIAGNOSIS — I10 Essential (primary) hypertension: Secondary | ICD-10-CM

## 2016-01-21 LAB — CUP PACEART INCLINIC DEVICE CHECK
Battery Impedance: 114 Ohm
Battery Voltage: 2.78 V
Brady Statistic AP VS Percent: 0 %
Brady Statistic AS VP Percent: 94 %
Brady Statistic AS VS Percent: 1 %
Date Time Interrogation Session: 20170626111408
Implantable Lead Implant Date: 20160329
Implantable Lead Location: 753860
Implantable Lead Model: 5076
Lead Channel Impedance Value: 426 Ohm
Lead Channel Impedance Value: 632 Ohm
Lead Channel Pacing Threshold Pulse Width: 0.4 ms
Lead Channel Sensing Intrinsic Amplitude: 11.2 mV
Lead Channel Sensing Intrinsic Amplitude: 2.8 mV
Lead Channel Setting Pacing Amplitude: 2 V
Lead Channel Setting Pacing Pulse Width: 0.4 ms
Lead Channel Setting Sensing Sensitivity: 2.8 mV
MDC IDC LEAD IMPLANT DT: 20160329
MDC IDC LEAD LOCATION: 753859
MDC IDC MSMT BATTERY REMAINING LONGEVITY: 134 mo
MDC IDC MSMT LEADCHNL RA PACING THRESHOLD AMPLITUDE: 0.5 V
MDC IDC MSMT LEADCHNL RV PACING THRESHOLD AMPLITUDE: 0.75 V
MDC IDC MSMT LEADCHNL RV PACING THRESHOLD PULSEWIDTH: 0.4 ms
MDC IDC SET LEADCHNL RV PACING AMPLITUDE: 2.5 V
MDC IDC STAT BRADY AP VP PERCENT: 5 %

## 2016-01-21 NOTE — Patient Instructions (Addendum)
Medication Instructions:  Your physician recommends that you continue on your current medications as directed. Please refer to the Current Medication list given to you today.   Labwork: None ordered   Testing/Procedures: None ordered   Follow-Up: Your physician wants you to follow-up in: 12 months with Chanetta Marshall, NP You will receive a reminder letter in the mail two months in advance. If you don't receive a letter, please call our office to schedule the follow-up appointment.  Remote monitoring is used to monitor your Pacemaker  from home. This monitoring reduces the number of office visits required to check your device to one time per year. It allows [place] to keep an eye on the functioning of your device to ensure it is working properly. You are scheduled for a device check from home on 04/21/16. You may send your transmission at any time that day. If you have a wireless device, the transmission will be sent automatically. After your physician reviews your transmission, you will receive a postcard with your next transmission date.     Any Other Special Instructions Will Be Listed Below (If Applicable).     If you need a refill on your cardiac medications before your next appointment, please call your pharmacy.

## 2016-01-21 NOTE — Progress Notes (Signed)
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  Since his last visit, the patient reports doing very well.  He recently went to [facility] (5/17) and was able to walk the parks all day long for 5 days without limitation.  He has rare SOB.  Today, he denies symptoms of palpitations, chest pain, lower extremity edema, dizziness, presyncope, or syncope.  The patient is otherwise without complaint today.   Past Medical History  Diagnosis Date  . Hyperlipidemia   . Cancer ([place])     skin  . CAD (coronary artery disease)     previous stent to LAD 2003  . LBBB (left bundle branch block)   . Hypertension   . S/P cardiac pacemaker procedure, placement 10/24/14 Medtronic Adapta L model ADDRL 1  10/25/2014   Past Surgical History  Procedure Laterality Date  . Tonsillectomy    . Coronary angioplasty with stent placement  03/01/2002    stenting to LAD - Dr. Glade Lloyd  . US echocardiography  12/05/2010    proximal septal thickening,mild concentric LVH,mild deptal hypokinesis,RV mildly dilated,LA mildly dilated,mild AI,moderate aortic root dilatation,septal motion c/w conduction abnormality  . Nm myocar perf wall motion  11/27/2010    normal  . Permanent pacemaker insertion N/A 10/24/2014    Procedure: PERMANENT PACEMAKER INSERTION;  Surgeon: [name], MD;  Location: [facility] CATH LAB;  Service: Cardiovascular;  Laterality: N/A;    ROS- all systems are reviewed and negative except as per HPI above  Current Outpatient Prescriptions  Medication Sig Dispense Refill  . aspirin EC 81 MG tablet Take 81 mg by mouth daily.     Marland Kitchen ezetimibe-simvastatin (VYTORIN) 10-40 MG per tablet Take 1 tablet by mouth at bedtime.    . fish oil-omega-3 fatty acids 1000 MG capsule Take 1 g by mouth daily.     Marland Kitchen glucosamine-chondroitin 500-400 MG tablet Take 1 tablet by mouth daily.     . Meth-Hyo-M Bl-Na Phos-Ph Sal (URIBEL)  118 MG CAPS Take 1 capsule by mouth daily as needed (urinary burning sensations).     . Multiple Vitamins-Minerals (MULTIVITAMIN WITH MINERALS) tablet Take 1 tablet by mouth daily.    . nitroGLYCERIN (NITROSTAT) 0.4 MG SL tablet Place 1 tablet (0.4 mg total) under the tongue every 5 (five) minutes as needed for chest pain. Reported on 11/01/2015 25 tablet 1  . tamsulosin (FLOMAX) 0.4 MG CAPS capsule Take 0.4 mg by mouth daily. Reported on 11/01/2015    . triamterene-hydrochlorothiazide (MAXZIDE-25) 37.5-25 MG per tablet Take 1 tablet by mouth daily.    . vitamin C (ASCORBIC ACID) 250 MG tablet Take 250 mg by mouth daily.     No current facility-administered medications for this visit.    Physical Exam: Filed Vitals:   01/21/16 0946  BP: 128/72  Pulse: 105  Height: 5\' 9"  (1.753 m)  Weight: 224 lb (101.606 kg)  SpO2: 94%    GEN- The patient is well appearing, alert and oriented x 3 today.   Head- normocephalic, atraumatic Eyes-  Sclera clear, conjunctiva pink Ears- hearing intact Oropharynx- clear Lungs- Clear to ausculation bilaterally, normal work of breathing Chest- pacemaker pocket is well healed Heart- tachycardic regular rhythm GI- soft, NT, ND, + BS Extremities- no clubbing, cyanosis, or edema  Pacemaker interrogation- reviewed in detail today,  See PACEART report  Assessment and Plan:  1. Complete heart block Normal pacemaker function See Pace Art report No  changes today  2. HTN Stable No change required today Mildly tachycardic today.  I have advised that he reduce maxzide given its tendency toward dehydration/ tachycardia.  He does not wish to make changes today.  3. CAD No ischemic symptoms  carelink Return to see EP NP in 1 year Follow-up with Dr Claiborne Billings as scheduled  [name] MD, [facility] 01/21/2016 10:34 AM

## 2016-02-05 DIAGNOSIS — L570 Actinic keratosis: Secondary | ICD-10-CM | POA: Diagnosis not present

## 2016-02-05 DIAGNOSIS — D485 Neoplasm of uncertain behavior of skin: Secondary | ICD-10-CM | POA: Diagnosis not present

## 2016-02-05 DIAGNOSIS — C44629 Squamous cell carcinoma of skin of left upper limb, including shoulder: Secondary | ICD-10-CM | POA: Diagnosis not present

## 2016-02-05 DIAGNOSIS — Z85828 Personal history of other malignant neoplasm of skin: Secondary | ICD-10-CM | POA: Diagnosis not present

## 2016-02-26 DIAGNOSIS — E7800 Pure hypercholesterolemia, unspecified: Secondary | ICD-10-CM | POA: Diagnosis not present

## 2016-02-26 DIAGNOSIS — E119 Type 2 diabetes mellitus without complications: Secondary | ICD-10-CM | POA: Diagnosis not present

## 2016-02-28 DIAGNOSIS — I10 Essential (primary) hypertension: Secondary | ICD-10-CM | POA: Diagnosis not present

## 2016-02-28 DIAGNOSIS — E119 Type 2 diabetes mellitus without complications: Secondary | ICD-10-CM | POA: Diagnosis not present

## 2016-02-28 DIAGNOSIS — E7800 Pure hypercholesterolemia, unspecified: Secondary | ICD-10-CM | POA: Diagnosis not present

## 2016-03-18 ENCOUNTER — Telehealth: Payer: Self-pay | Admitting: Cardiovascular Disease

## 2016-03-18 NOTE — Telephone Encounter (Signed)
Closed encounter °

## 2016-03-27 DIAGNOSIS — L570 Actinic keratosis: Secondary | ICD-10-CM | POA: Diagnosis not present

## 2016-03-27 DIAGNOSIS — L565 Disseminated superficial actinic porokeratosis (DSAP): Secondary | ICD-10-CM | POA: Diagnosis not present

## 2016-03-27 DIAGNOSIS — Z85828 Personal history of other malignant neoplasm of skin: Secondary | ICD-10-CM | POA: Diagnosis not present

## 2016-03-27 DIAGNOSIS — L820 Inflamed seborrheic keratosis: Secondary | ICD-10-CM | POA: Diagnosis not present

## 2016-04-21 ENCOUNTER — Ambulatory Visit: Payer: Medicare Other | Admitting: *Deleted

## 2016-04-21 ENCOUNTER — Emergency Department (HOSPITAL_COMMUNITY): Payer: Medicare Other

## 2016-04-21 ENCOUNTER — Emergency Department (HOSPITAL_COMMUNITY)
Admission: EM | Admit: 2016-04-21 | Discharge: 2016-04-22 | Disposition: A | Discharge status: Home / Self Care | Payer: Medicare Other | Attending: Emergency Medicine | Admitting: Emergency Medicine

## 2016-04-21 ENCOUNTER — Encounter (HOSPITAL_COMMUNITY): Payer: Self-pay | Admitting: Emergency Medicine

## 2016-04-21 ENCOUNTER — Telehealth: Payer: Self-pay | Admitting: Cardiology

## 2016-04-21 DIAGNOSIS — Z87891 Personal history of nicotine dependence: Secondary | ICD-10-CM | POA: Insufficient documentation

## 2016-04-21 DIAGNOSIS — Z951 Presence of aortocoronary bypass graft: Secondary | ICD-10-CM | POA: Diagnosis not present

## 2016-04-21 DIAGNOSIS — H0520 Unspecified exophthalmos: Secondary | ICD-10-CM | POA: Insufficient documentation

## 2016-04-21 DIAGNOSIS — M25612 Stiffness of left shoulder, not elsewhere classified: Secondary | ICD-10-CM | POA: Diagnosis not present

## 2016-04-21 DIAGNOSIS — R9431 Abnormal electrocardiogram [ECG] [EKG]: Secondary | ICD-10-CM | POA: Diagnosis not present

## 2016-04-21 DIAGNOSIS — H539 Unspecified visual disturbance: Secondary | ICD-10-CM

## 2016-04-21 DIAGNOSIS — I2510 Atherosclerotic heart disease of native coronary artery without angina pectoris: Secondary | ICD-10-CM | POA: Diagnosis not present

## 2016-04-21 DIAGNOSIS — H02403 Unspecified ptosis of bilateral eyelids: Secondary | ICD-10-CM

## 2016-04-21 DIAGNOSIS — H02834 Dermatochalasis of left upper eyelid: Secondary | ICD-10-CM | POA: Diagnosis not present

## 2016-04-21 DIAGNOSIS — S40012A Contusion of left shoulder, initial encounter: Secondary | ICD-10-CM | POA: Diagnosis not present

## 2016-04-21 DIAGNOSIS — R251 Tremor, unspecified: Secondary | ICD-10-CM | POA: Diagnosis not present

## 2016-04-21 DIAGNOSIS — Z5181 Encounter for therapeutic drug level monitoring: Secondary | ICD-10-CM | POA: Insufficient documentation

## 2016-04-21 DIAGNOSIS — Z7982 Long term (current) use of aspirin: Secondary | ICD-10-CM | POA: Diagnosis not present

## 2016-04-21 DIAGNOSIS — Z85828 Personal history of other malignant neoplasm of skin: Secondary | ICD-10-CM | POA: Diagnosis not present

## 2016-04-21 DIAGNOSIS — I10 Essential (primary) hypertension: Secondary | ICD-10-CM | POA: Diagnosis not present

## 2016-04-21 DIAGNOSIS — H2513 Age-related nuclear cataract, bilateral: Secondary | ICD-10-CM | POA: Diagnosis not present

## 2016-04-21 DIAGNOSIS — Z950 Presence of cardiac pacemaker: Secondary | ICD-10-CM | POA: Insufficient documentation

## 2016-04-21 DIAGNOSIS — Z79899 Other long term (current) drug therapy: Secondary | ICD-10-CM | POA: Insufficient documentation

## 2016-04-21 DIAGNOSIS — S42255A Nondisplaced fracture of greater tuberosity of left humerus, initial encounter for closed fracture: Secondary | ICD-10-CM | POA: Diagnosis not present

## 2016-04-21 DIAGNOSIS — M25512 Pain in left shoulder: Secondary | ICD-10-CM | POA: Diagnosis not present

## 2016-04-21 DIAGNOSIS — H542 Low vision, both eyes: Secondary | ICD-10-CM | POA: Diagnosis not present

## 2016-04-21 DIAGNOSIS — H02831 Dermatochalasis of right upper eyelid: Secondary | ICD-10-CM | POA: Diagnosis not present

## 2016-04-21 DIAGNOSIS — I442 Atrioventricular block, complete: Secondary | ICD-10-CM | POA: Diagnosis not present

## 2016-04-21 LAB — COMPREHENSIVE METABOLIC PANEL
ALBUMIN: 3.9 g/dL (ref 3.5–5.0)
ALK PHOS: 60 U/L (ref 38–126)
ALT: 21 U/L (ref 17–63)
AST: 25 U/L (ref 15–41)
Anion gap: 8 (ref 5–15)
BILIRUBIN TOTAL: 1.2 mg/dL (ref 0.3–1.2)
BUN: 18 mg/dL (ref 6–20)
CALCIUM: 9 mg/dL (ref 8.9–10.3)
CO2: 27 mmol/L (ref 22–32)
Chloride: 106 mmol/L (ref 101–111)
Creatinine, Ser: 0.76 mg/dL (ref 0.61–1.24)
GFR calc Af Amer: >60 mL/min (ref >60)
GFR calc non Af Amer: >60 mL/min (ref >60)
GLUCOSE: 103 mg/dL — AB (ref 65–99)
POTASSIUM: 3.5 mmol/L (ref 3.5–5.1)
Sodium: 141 mmol/L (ref 135–145)
TOTAL PROTEIN: 6.3 g/dL — AB (ref 6.5–8.1)

## 2016-04-21 LAB — I-STAT CHEM 8, ED
BUN: 21 mg/dL — AB (ref 6–20)
CREATININE: 0.9 mg/dL (ref 0.61–1.24)
Calcium, Ion: 1.12 mmol/L — ABNORMAL LOW (ref 1.15–1.40)
Chloride: 101 mmol/L (ref 101–111)
Glucose, Bld: 99 mg/dL (ref 65–99)
HEMATOCRIT: 42 % (ref 39.0–52.0)
Hemoglobin: 14.3 g/dL (ref 13.0–17.0)
Potassium: 3.4 mmol/L — ABNORMAL LOW (ref 3.5–5.1)
Sodium: 141 mmol/L (ref 135–145)
TCO2: 29 mmol/L (ref 0–100)

## 2016-04-21 LAB — CBC
HCT: 44.4 % (ref 39.0–52.0)
HEMOGLOBIN: 14.3 g/dL (ref 13.0–17.0)
MCH: 30.2 pg (ref 26.0–34.0)
MCHC: 32.2 g/dL (ref 30.0–36.0)
MCV: 93.9 fL (ref 78.0–100.0)
Platelets: 228 10*3/uL (ref 150–400)
RBC: 4.73 MIL/uL (ref 4.22–5.81)
RDW: 13.3 % (ref 11.5–15.5)
WBC: 6.1 10*3/uL (ref 4.0–10.5)

## 2016-04-21 LAB — DIFFERENTIAL
BASOS ABS: 0 10*3/uL (ref 0.0–0.1)
Basophils Relative: 0 %
Eosinophils Absolute: 0.2 10*3/uL (ref 0.0–0.7)
Eosinophils Relative: 3 %
LYMPHS ABS: 2.4 10*3/uL (ref 0.7–4.0)
LYMPHS PCT: 39 %
Monocytes Absolute: 0.5 10*3/uL (ref 0.1–1.0)
Monocytes Relative: 8 %
NEUTROS PCT: 50 %
Neutro Abs: 3.1 10*3/uL (ref 1.7–7.7)

## 2016-04-21 LAB — I-STAT TROPONIN, ED: Troponin i, poc: 0 ng/mL (ref 0.00–0.08)

## 2016-04-21 LAB — PROTIME-INR
INR: 1
Prothrombin Time: 13.2 seconds (ref 11.4–15.2)

## 2016-04-21 LAB — APTT: APTT: 31 s (ref 24–36)

## 2016-04-21 NOTE — ED Notes (Signed)
Pt reports that he has had decreased vision and "drooping" eye lids since Friday evening. Pt reports that he is having issues seeing "in the sunlight and at night with the head lights of passing cars."  Chief Complaint  Patient presents with  . Stroke Symptoms   Past Medical History:  Diagnosis Date  . CAD (coronary artery disease)    previous stent to LAD 2003  . Cancer ([place])    skin  . Hyperlipidemia   . Hypertension   . LBBB (left bundle branch block)   . S/P cardiac pacemaker procedure, placement 10/24/14 Medtronic Adapta L model ADDRL 1  10/25/2014

## 2016-04-21 NOTE — ED Triage Notes (Signed)
Pt reports having trouble with vision since Saturday. PCP is concerned pt may have had a stroke. Pt reports he has blurred vision in his eyes. PCP also reports pt has been "twitching."

## 2016-04-21 NOTE — Telephone Encounter (Signed)
LMOVM reminding pt to send remote transmission.   

## 2016-04-21 NOTE — ED Provider Notes (Signed)
[facility] DEPT Provider Note   CSN: [id number] Arrival date & time: 04/21/16  1559     History   Chief Complaint Chief Complaint  Patient presents with  . Stroke Symptoms    HPI David [name] is a 80 y.o. male. He has history of coronary artery disease with coronary stent, hypertension, hyperlipidemia, complete heart block with pacemaker. 2 days ago, he noted he was having difficulty with his vision. Difficulty was magnified when in the sunlight. He was not able to drive at night because of problems with the headlights of cars. He denies any headache or weakness or numbness or tingling. He has drooping of his eyelids which seems to have gotten worse. Symptoms have been more or less stable over the 2 days. He went to see his ophthalmologist today who did numerous tests and referred him to the emergency department. He denies any pain in his eyes and denies any foreign body sensation.  The history is provided by the patient.    Past Medical History:  Diagnosis Date  . CAD (coronary artery disease)    previous stent to LAD 2003  . Cancer ([place])    skin  . Hyperlipidemia   . Hypertension   . LBBB (left bundle branch block)   . S/P cardiac pacemaker procedure, placement 10/24/14 Medtronic Adapta L model ADDRL 1  10/25/2014    Patient Active Problem List   Diagnosis Date Noted  . CAD in native artery 11/03/2015  . Sleep apnea 11/03/2015  . S/P cardiac pacemaker procedure, placement 10/24/14 Medtronic Adapta L model ADDRL 1  10/25/2014  . Complete heart block ([place]) 10/24/2014  . CHB (complete heart block), symptomatic 10/24/2014  . LAD stent 2003 10/26/2013  . HTN (hypertension) 10/26/2013  . Hyperlipidemia with target LDL less than 70 10/26/2013  . Mild obesity 10/26/2013  . Spermatic cord mass-left 03/25/2012    Past Surgical History:  Procedure Laterality Date  . CORONARY ANGIOPLASTY WITH STENT PLACEMENT  03/01/2002   stenting to LAD - Dr. Glade Lloyd  . NM MYOCAR PERF  WALL MOTION  11/27/2010   normal  . PERMANENT PACEMAKER INSERTION N/A 10/24/2014   Procedure: PERMANENT PACEMAKER INSERTION;  Surgeon: [name], MD;  Location: [facility] CATH LAB;  Service: Cardiovascular;  Laterality: N/A;  . TONSILLECTOMY    . US ECHOCARDIOGRAPHY  12/05/2010   proximal septal thickening,mild concentric LVH,mild deptal hypokinesis,RV mildly dilated,LA mildly dilated,mild AI,moderate aortic root dilatation,septal motion c/w conduction abnormality       Home Medications    Prior to Admission medications   Medication Sig Start Date End Date Taking? Authorizing Provider  aspirin EC 81 MG tablet Take 81 mg by mouth daily.    Yes Historical Provider, MD  ezetimibe-simvastatin (VYTORIN) 10-40 MG per tablet Take 1 tablet by mouth at bedtime.   Yes Historical Provider, MD  fish oil-omega-3 fatty acids 1000 MG capsule Take 1 g by mouth daily.    Yes Historical Provider, MD  glucosamine-chondroitin 500-400 MG tablet Take 1 tablet by mouth daily.    Yes Historical Provider, MD  Meth-Hyo-M Bl-Na Phos-Ph Sal (URIBEL) 118 MG CAPS Take 1 capsule by mouth daily as needed (urinary burning sensations).    Yes Historical Provider, MD  Multiple Vitamins-Minerals (MULTIVITAMIN WITH MINERALS) tablet Take 1 tablet by mouth daily.   Yes Historical Provider, MD  nitroGLYCERIN (NITROSTAT) 0.4 MG SL tablet Place 1 tablet (0.4 mg total) under the tongue every 5 (five) minutes as needed for chest pain. Reported on 11/01/2015 11/07/15  Yes [name], MD  tamsulosin (FLOMAX) 0.4 MG CAPS capsule Take 0.4 mg by mouth daily. Reported on 11/01/2015   Yes Historical Provider, MD  triamterene-hydrochlorothiazide (MAXZIDE-25) 37.5-25 MG per tablet Take 1 tablet by mouth daily.   Yes Historical Provider, MD  vitamin C (ASCORBIC ACID) 250 MG tablet Take 250 mg by mouth daily.   Yes Historical Provider, MD    Family History Family History  Problem Relation Age of Onset  . Breast cancer Mother   . Colon cancer  Father   . Lung cancer Father     Social History Social History  Substance Use Topics  . Smoking status: Former Smoker    Types: Cigarettes, Cigars    Quit date: 10/27/1982  . Smokeless tobacco: Never Used  . Alcohol use Yes     Comment: 1-2 drink per week     Allergies   Gris-peg [griseofulvin]   Review of Systems Review of Systems  All other systems reviewed and are negative.    Physical Exam Updated Vital Signs BP 152/84 (BP Location: Right Arm)   Pulse 66   Temp 98.4 F (36.9 C) (Oral)   Resp 19   Ht 5\' 9"  (1.753 m)   Wt 220 lb (99.8 kg)   SpO2 95%   BMI 32.49 kg/m   Physical Exam  Nursing note and vitals reviewed.  80 year old male, resting comfortably and in no acute distress. Vital signs are significant for hypertension. Oxygen saturation is 95%, which is normal. Head is normocephalic and atraumatic. PERRLA, EOMI. Oropharynx is clear. There is mild to moderate ptosis which is slightly worse on the right. Neck is nontender and supple without adenopathy or JVD. Back is nontender and there is no CVA tenderness. There are no carotid bruits. Lungs are clear without rales, wheezes, or rhonchi. Chest is nontender. Heart has regular rate and rhythm without murmur. Abdomen is soft, flat, nontender without masses or hepatosplenomegaly and peristalsis is normoactive. Extremities have no cyanosis or edema, full range of motion is present. Skin is warm and dry without rash. Neurologic: Mental status is normal, cranial nerves are intact, there are no motor or sensory deficits.  ED Treatments / Results  Labs (all labs ordered are listed, but only abnormal results are displayed) Labs Reviewed  COMPREHENSIVE METABOLIC PANEL - Abnormal; Notable for the following:       Result Value   Glucose, Bld 103 (*)    Total Protein 6.3 (*)    All other components within normal limits  I-STAT CHEM 8, ED - Abnormal; Notable for the following:    Potassium 3.4 (*)    BUN 21 (*)     Calcium, Ion 1.12 (*)    All other components within normal limits  PROTIME-INR  APTT  CBC  DIFFERENTIAL  ACETYLCHOLINE RECEPTOR AB, ALL  I-STAT TROPOININ, ED  CBG MONITORING, ED    EKG  EKG Interpretation  Date/Time:  Monday April 21 2016 16:23:04 EDT Ventricular Rate:  70 PR Interval:  218 QRS Duration: 154 QT Interval:  416 QTC Calculation: 449 R Axis:   -73 Text Interpretation:  Atrial-sensed ventricular-paced rhythm with prolonged AV conduction Abnormal ECG When compared with ECG of 10/25/2014, No significant change was found Confirmed by [facility]  MD, Waynette Towers ([id number]) on 04/21/2016 11:30:33 PM       Radiology Ct Head Wo Contrast  Result Date: 04/21/2016 CLINICAL DATA:  Visual problems. EXAM: CT HEAD WITHOUT CONTRAST TECHNIQUE: Contiguous axial images were obtained from the  base of the skull through the vertex without intravenous contrast. COMPARISON:  Brain MRI 03/07/2005 FINDINGS: Brain: Again noted is a CSF attenuating structure in the left middle cranial fossa. This is most compatible with an arachnoid cyst and unchanged. There is mild low-density in the periventricular white matter suggesting chronic changes. No evidence for acute hemorrhage, midline shift, hydrocephalus or large infarct. Vascular: No hyperdense vessel or unexpected calcification. Skull: Normal. Negative for fracture or focal lesion. Sinuses/Orbits: No acute finding. Other: None. IMPRESSION: No acute intracranial abnormality. Evidence for chronic small vessel ischemic changes. Stable appearance of the presumed arachnoid cyst in the left middle cranial fossa. Electronically Signed   By: Markus Daft M.D.   On: 04/21/2016 17:28    Procedures Procedures (including critical care time)  Medications Ordered in ED Medications - No data to display   Initial Impression / Assessment and Plan / ED Course  I have reviewed the triage vital signs and the nursing notes.  Pertinent labs & imaging results that were  available during my care of the patient were reviewed by me and considered in my medical decision making (see chart for details).  Clinical Course   Decreased vision with ptosis which Possibly represents a new stroke. I reviewed his records from his ophthalmologist's visit today, and he had normal exam with the exception of visual field losses with left homonymous hemianopsia and also decreased vision in the right superior quadrants. Today, CT is unremarkable. He cannot get MRI because of his pacemaker. I've discussed this with Dr. Leonel Ramsay who is coming to see the patient.  Dr. Cecil Cobbs note appreciated. He feels visual loss is mostly related to his ptosis. Possible myasthenia gravis. He recommends discharge with prescription for Mestinon and follow-up with neurology. Prescription is written and referral is given to [facility].   Final Clinical Impressions(s) / ED Diagnoses   Final diagnoses:  Ptosis, bilateral  Visual changes    New Prescriptions New Prescriptions   PYRIDOSTIGMINE (MESTINON) 60 MG TABLET    Take 0.5 tablets (30 mg total) by mouth 3 (three) times daily.     Delora Fuel, MD [id number]

## 2016-04-21 NOTE — Progress Notes (Addendum)
Remote pacemaker transmission. Not received

## 2016-04-22 DIAGNOSIS — H02403 Unspecified ptosis of bilateral eyelids: Secondary | ICD-10-CM

## 2016-04-22 MED ORDER — ACETAMINOPHEN 325 MG PO TABS
650.0000 mg | ORAL_TABLET | Freq: Once | ORAL | Status: AC
  Administered 2016-04-22: 650 mg via ORAL
  Filled 2016-04-22: qty 2

## 2016-04-22 MED ORDER — PYRIDOSTIGMINE BROMIDE 60 MG PO TABS: 30.0000 mg | ORAL_TABLET | Freq: Three times a day (TID) | ORAL | 0 refills | Status: DC

## 2016-04-22 NOTE — Discharge Instructions (Signed)
Your vision problem seems to be from your eyelids drooping. This can sometimes be from a neurologic condition called myasthenia gravis. You are being given some medicine to see if that will help. Please follow-up with the neurologist. Also, continue to follow-up with your ophthalmologist.

## 2016-04-22 NOTE — ED Notes (Signed)
Pt provided with d/c instructions at this time. Pt verbalizes understanding of d/c instructions as well as follow up procedure after d/c. Pt provided with RX at time of d/c. Pt verbalizes understanding of RX directions.  Pt in no apparent distress at this time.  Pt ambulatory at time of d/c.

## 2016-04-22 NOTE — Consult Note (Addendum)
Neurology Consultation Reason for Consult: Ptosis Referring Physician: Roxanne Mins,   CC: Vision problems  History is obtained from: Patient  HPI: David [name] is a 80 y.o. male who presents with vision problems that became apparent after return from her recent trip. He states that during the trip, he noticed that he was having some difficulty seeing intermittently but it was not severe. He states that he drove to leave for his trip without problem, but on return he was having trouble seeing the car, and cannot see to drive. He therefore presented today to his ophthalmologist who felt that he had left hemianopia and a right upper quadrantanopia. He also has fairly severe ptosis. Of note, it does not appear that the ophthalmologist held his eyelids aloft while checking visual fields.  His wife reports that she has noticed the ptosis, and it has been progressive over the past few weeks, and maybe even longer than that. She notes that it is much worse now than it has been previously.  He notes that his vision seems to be better in the mornings than in the evenings. If he holds his eyelids open then he feels like his vision is clear at its baseline.  He denies any shortness of breath, trouble swallowing, double, difficulty with going upstairs, neck pain.  Also of note, the patient has bilateral hand tremor which has been there for quite some time.  ROS: A 14 point ROS was performed and is negative except as noted in the HPI.   Past Medical History:  Diagnosis Date  . CAD (coronary artery disease)    previous stent to LAD 2003  . Cancer ([place])    skin  . Hyperlipidemia   . Hypertension   . LBBB (left bundle branch block)   . S/P cardiac pacemaker procedure, placement 10/24/14 Medtronic Adapta L model ADDRL 1  10/25/2014     Family History  Problem Relation Age of Onset  . Breast cancer Mother   . Colon cancer Father   . Lung cancer Father      Social History:  reports that he quit  smoking about 33 years ago. His smoking use included Cigarettes and Cigars. He has never used smokeless tobacco. He reports that he drinks alcohol. He reports that he does not use drugs.   Exam: Current vital signs: BP 152/89   Pulse 68   Temp 98.4 F (36.9 C) (Oral)   Resp 19   Ht 5\' 9"  (1.753 m)   Wt 99.8 kg (220 lb)   SpO2 94%   BMI 32.49 kg/m  Vital signs in last 24 hours: Temp:  [97.7 F (36.5 C)-98.4 F (36.9 C)] 98.4 F (36.9 C) (09/25 2235) Pulse Rate:  [62-75] 68 (09/26 0045) Resp:  [16-26] 19 (09/26 0045) BP: (148-159)/(80-104) 152/89 (09/26 0045) SpO2:  [93 %-96 %] 94 % (09/26 0045) Weight:  [99.8 kg (220 lb)] 99.8 kg (220 lb) (09/25 1609)   Physical Exam  Constitutional: Appears well-developed and well-nourished.  Psych: Affect appropriate to situation Eyes: No scleral injection HENT: No OP obstrucion Head: Normocephalic.  Cardiovascular: Normal rate and regular rhythm.  Respiratory: Effort normal and breath sounds normal to anterior ascultation GI: Soft.  No distension. There is no tenderness.  Skin: WDI  Neuro: Mental Status: Patient is awake, alert, oriented to person, place, month, year, and situation. Patient is able to give a clear and coherent history. No signs of aphasia or neglect Cranial Nerves: II: Visual Fields are full. Pupils are equal,  round, and reactive to light.  III,IV, VI: EOMI without ptosis or diploplia.  V: Facial sensation is symmetric to temperature VII: Facial movement is symmetric.  VIII: hearing is intact to voice X: Uvula elevates symmetrically XI: Shoulder shrug is symmetric. XII: tongue is midline without atrophy or fasciculations.  Motor: Tone is normal. Bulk is normal. 5/5 strength was present in all four extremities.  Sensory: Sensation is symmetric to light touch and temperature in the arms and legs. Cerebellar: He has intentional tremor bilaterally as well as mild head titubation     I have reviewed labs in  epic and the results pertinent to this consultation are: CMP-unremarkable  I have reviewed the images obtained: CT head-unremarkable  Impression: 80 year old male with progressive vision loss which is greatly improved by holding open the patient's eyelids. I suspect that this is all due to his ptosis. The tremor, head titubation is due to essential tremor which is a slowly progressive underlying disease for which the patient does not want treatment.  Though midbrain lesions can give bilateral ptosis, there is almost always accompanying signs of third nerve palsy. The progressive nature would argue against a vascular cause. He has no other signs or symptoms to suggest third nerve palsy as an etiology, no other signs of Horner's. One consideration would be myasthenia gravis, though he also does not have other signs of this I think it is worth while to check for.  When I described that it could take a few weeks for the labs come back his wife inquired about possible treatments in the interim and I do think that Mestinon may be reasonable to try, if he gets benefit from it this would be supportive of a diagnosis of myasthenia. I would favor ruling this out prior to consideration of surgery.  Recommendations: 1) acetylcholine receptor antibodies 2) follow-up with outpatient neurology, ophthalmology   Roland Rack, MD [facility] [phone]  If 7pm- 7am, please page neurology on call as listed in [address].

## 2016-04-23 ENCOUNTER — Encounter: Payer: Self-pay | Admitting: Cardiology

## 2016-04-27 LAB — ACETYLCHOLINE RECEPTOR AB, ALL
ACETYLCHOLINE MODULAT AB: 25 % — AB (ref 0–20)
Acety choline binding ab: 1.85 nmol/L — ABNORMAL HIGH (ref 0.00–0.24)
Acetylchol Block Ab: 57 % — ABNORMAL HIGH (ref 0–25)

## 2016-04-29 DIAGNOSIS — E876 Hypokalemia: Secondary | ICD-10-CM | POA: Diagnosis not present

## 2016-04-29 DIAGNOSIS — G7000 Myasthenia gravis without (acute) exacerbation: Secondary | ICD-10-CM | POA: Diagnosis not present

## 2016-04-29 DIAGNOSIS — Z23 Encounter for immunization: Secondary | ICD-10-CM | POA: Diagnosis not present

## 2016-04-30 DIAGNOSIS — M25572 Pain in left ankle and joints of left foot: Secondary | ICD-10-CM | POA: Diagnosis not present

## 2016-05-01 ENCOUNTER — Encounter: Payer: Self-pay | Admitting: Neurology

## 2016-05-01 ENCOUNTER — Ambulatory Visit (INDEPENDENT_AMBULATORY_CARE_PROVIDER_SITE_OTHER): Payer: Medicare Other | Admitting: Neurology

## 2016-05-01 ENCOUNTER — Telehealth: Payer: Self-pay | Admitting: Neurology

## 2016-05-01 DIAGNOSIS — H02409 Unspecified ptosis of unspecified eyelid: Secondary | ICD-10-CM | POA: Insufficient documentation

## 2016-05-01 DIAGNOSIS — G7000 Myasthenia gravis without (acute) exacerbation: Secondary | ICD-10-CM | POA: Diagnosis not present

## 2016-05-01 DIAGNOSIS — H02403 Unspecified ptosis of bilateral eyelids: Secondary | ICD-10-CM | POA: Diagnosis not present

## 2016-05-01 MED ORDER — PYRIDOSTIGMINE BROMIDE 60 MG PO TABS: ORAL_TABLET | ORAL | 11 refills | Status: DC

## 2016-05-01 NOTE — Telephone Encounter (Signed)
I have spoken with David [name]--new order for CT with contrast in EPIC/fim

## 2016-05-01 NOTE — Telephone Encounter (Signed)
David [name]/GI called back to advise, order for CT soft tissue neck is still showing with and without contrast. Needs order to say with contrast not both.

## 2016-05-01 NOTE — Telephone Encounter (Signed)
David [name]/GI [phone] called to advise CT for soft tissue neck is ordered w/wo but it needs to be either w or w/o but can't be both. Please send new order  She is requesting a call when this has been completed

## 2016-05-01 NOTE — Progress Notes (Signed)
[facility]  PATIENT: David [name] DOB: 09-08-1935  REFERRING DOCTOR OR PCP:  [name] SOURCE: Patient, records from Dr. Shelia Media, Lab resulkts  _________________________________   HISTORICAL  CHIEF COMPLAINT:  Chief Complaint  Patient presents with  . Bilateral Ptosis    Sts. has had droopy eyelids and puffiness under eyes for yrs.  Sts. on 04-19-16, he and his wife had been traveling for 2 weeks in [place].  Sts. the trip was stressful--wife broke her shoulder, he sprained his ankle--lack of sleep.  Sts. eyelids were especially droopy and he was not able to see to drive home. He was seen at [facility] ER, started on Mestinon and had labs drawn.  Pt. sts. eyelids have not been droopy since starting Mestinon.  He is here to confirm dx. of MG/fim    HISTORY OF PRESENT ILLNESS:  I had the pleasure seeing you patient, David [name], [facility] for neurologic consultation regarding his recent diagnosis of myasthenia gravis. As you know, he is a 80 year old man who had the onset of ptosis, while traveling in [place] and [place], about 3 weeks ago. He first noted the ptosis while trying to watch a movie in the bus (screen was up high) and noting that the eyelids were covering his vision.  He did note the trip was stressful and he and his wife both had injuries (he sprained ankle and wife broke shoulder).    At no time did he note any difficulties with proximal or distal muscle strength. Specifically, he will did not note any shoulder or hip weakness and he denied any difficulty rising from a chair or a squatting position.  When he returned home, he saw Dr. Gershon Crane (Ophth).  Surgery for ptosis was discussed.   His PCP advised him to go the ER .   He went to the emergency room 04/21/16 and was found to have ptosis. Lab work was performed and the acetylcholine binding antibodies were elevated at 1.85 (less than 0.24 normal). 57% were blocking antibodies  and 25% or modulating antibodies.   Other lab work was noncontributory.     CT scan of the head showed age-related mild chronic microvascular ischemic change.   I personally reviewed the CT scan images and concur with the official interpretation.  He was started on Mestinon 30 mg by mouth 3 times a day.  Since starting the medication, has done better and he feels that the ptosis has resolved. He tolerates Mestinon well. Specifically, there are no GI symptoms  REVIEW OF SYSTEMS: Constitutional: No fevers, chills, sweats, or change in appetite Eyes: No visual changes, double vision, eye pain Ear, nose and throat: No hearing loss, ear pain, nasal congestion, sore throat Cardiovascular: No chest pain, palpitations Respiratory: No shortness of breath at rest or with exertion.   No wheezes GastrointestinaI: No nausea, vomiting, diarrhea, abdominal pain, fecal incontinence Genitourinary: No dysuria, urinary retention or frequency.  No nocturia. Musculoskeletal: No neck pain, back pain Integumentary: No rash, pruritus, skin lesions Neurological: as above Psychiatric: No depression at this time.  No anxiety Endocrine: No palpitations, diaphoresis, change in appetite, change in weigh or increased thirst Hematologic/Lymphatic: No anemia, purpura, petechiae. Allergic/Immunologic: No itchy/runny eyes, nasal congestion, recent allergic reactions, rashes  ALLERGIES: Allergies  Allergen Reactions  . Gris-Peg [Griseofulvin] Other (See Comments)    headaches    HOME MEDICATIONS:  Current Outpatient Prescriptions:  .  aspirin EC 81 MG tablet, Take 81 mg by mouth daily. , Disp: ,  Rfl:  .  ezetimibe-simvastatin (VYTORIN) 10-40 MG per tablet, Take 1 tablet by mouth at bedtime., Disp: , Rfl:  .  fish oil-omega-3 fatty acids 1000 MG capsule, Take 1 g by mouth daily. , Disp: , Rfl:  .  glucosamine-chondroitin 500-400 MG tablet, Take 1 tablet by mouth daily. , Disp: , Rfl:  .  Meth-Hyo-M Bl-Na Phos-Ph  Sal (URIBEL) 118 MG CAPS, Take 1 capsule by mouth daily as needed (urinary burning sensations). , Disp: , Rfl:  .  Multiple Vitamins-Minerals (MULTIVITAMIN WITH MINERALS) tablet, Take 1 tablet by mouth daily., Disp: , Rfl:  .  nitroGLYCERIN (NITROSTAT) 0.4 MG SL tablet, Place 1 tablet (0.4 mg total) under the tongue every 5 (five) minutes as needed for chest pain. Reported on 11/01/2015, Disp: 25 tablet, Rfl: 1 .  pyridostigmine (MESTINON) 60 MG tablet, Take 0.5 tablets (30 mg total) by mouth 3 (three) times daily., Disp: 45 tablet, Rfl: 0 .  tamsulosin (FLOMAX) 0.4 MG CAPS capsule, Take 0.4 mg by mouth daily. Reported on 11/01/2015, Disp: , Rfl:  .  triamterene-hydrochlorothiazide (MAXZIDE-25) 37.5-25 MG per tablet, Take 1 tablet by mouth daily., Disp: , Rfl:  .  vitamin C (ASCORBIC ACID) 250 MG tablet, Take 250 mg by mouth daily., Disp: , Rfl:   PAST MEDICAL HISTORY: Past Medical History:  Diagnosis Date  . CAD (coronary artery disease)    previous stent to LAD 2003  . Cancer ([place])    skin  . Hyperlipidemia   . Hypertension   . LBBB (left bundle branch block)   . S/P cardiac pacemaker procedure, placement 10/24/14 Medtronic Adapta L model ADDRL 1  10/25/2014    PAST SURGICAL HISTORY: Past Surgical History:  Procedure Laterality Date  . CORONARY ANGIOPLASTY WITH STENT PLACEMENT  03/01/2002   stenting to LAD - Dr. Glade Lloyd  . NM MYOCAR PERF WALL MOTION  11/27/2010   normal  . PERMANENT PACEMAKER INSERTION N/A 10/24/2014   Procedure: PERMANENT PACEMAKER INSERTION;  Surgeon: [name], MD;  Location: [facility] CATH LAB;  Service: Cardiovascular;  Laterality: N/A;  . TONSILLECTOMY    . US ECHOCARDIOGRAPHY  12/05/2010   proximal septal thickening,mild concentric LVH,mild deptal hypokinesis,RV mildly dilated,LA mildly dilated,mild AI,moderate aortic root dilatation,septal motion c/w conduction abnormality    FAMILY HISTORY: Family History  Problem Relation Age of Onset  . Breast cancer Mother   .  Colon cancer Father   . Lung cancer Father     SOCIAL HISTORY:  Social History   Social History  . Marital status: Married    Spouse name: N/A  . Number of children: N/A  . Years of education: N/A   Occupational History  . Not on file.   Social History Main Topics  . Smoking status: Former Smoker    Types: Cigarettes, Cigars    Quit date: 10/27/1982  . Smokeless tobacco: Never Used  . Alcohol use Yes     Comment: 1-2 drink per week  . Drug use: No  . Sexual activity: Not on file   Other Topics Concern  . Not on file   Social History Narrative  . No narrative on file     PHYSICAL EXAM  Vitals:   05/01/16 0914  BP: 134/86  Pulse: 88  Resp: 16  Weight: 212 lb 8 oz (96.4 kg)  Height: 5\' 9"  (1.753 m)    Body mass index is 31.38 kg/m.   General: The patient is well-developed and well-nourished and in no acute distress  Eyes:  Funduscopic exam shows normal optic discs and retinal vessels.  Neck: The neck is supple, no carotid bruits are noted.  The neck is nontender.  Cardiovascular: The heart has a regular rate and rhythm with a normal S1 and S2. There were no murmurs, gallops or rubs. Lungs are clear to auscultation.  Skin: Extremities are without significant edema.  Musculoskeletal:  Back is nontender  Neurologic Exam  Mental status: The patient is alert and oriented x 3 at the time of the examination. The patient has apparent normal recent and remote memory, with an apparently normal attention span and concentration ability.   Speech is normal.  Cranial nerves: Extraocular movements are full. Pupils are equal, round, and reactive to light and accomodation.  Visual fields are full.  Facial symmetry is present. There is good facial sensation to soft touch bilaterally.Facial strength is normal.  Trapezius and sternocleidomastoid strength is normal. No dysarthria is noted.  The tongue is midline, and the patient has symmetric elevation of the soft palate. No  obvious hearing deficits are noted.  Motor:  Muscle bulk is normal.   Tone is normal. Strength is  5 / 5 in all 4 extremities.   Sensory: Sensory testing is intact to pinprick, soft touch and vibration sensation in all 4 extremities.  Coordination: Cerebellar testing reveals good finger-nose-finger and heel-to-shin bilaterally.  Gait and station: Station is normal.   Gait is normal. Tandem gait is normal for age (mildly wide). Romberg is negative.   Reflexes: Deep tendon reflexes are symmetric and normal bilaterally.   Plantar responses are flexor.    DIAGNOSTIC DATA (LABS, IMAGING, TESTING) - I reviewed patient records, labs, notes, testing and imaging myself where available.  Lab Results  Component Value Date   WBC 6.1 04/21/2016   HGB 14.3 04/21/2016   HCT 42.0 04/21/2016   MCV 93.9 04/21/2016   PLT 228 04/21/2016      Component Value Date/Time   NA 141 04/21/2016 1651   K 3.4 (L) 04/21/2016 1651   CL 101 04/21/2016 1651   CO2 27 04/21/2016 1630   GLUCOSE 99 04/21/2016 1651   BUN 21 (H) 04/21/2016 1651   CREATININE 0.90 04/21/2016 1651   CALCIUM 9.0 04/21/2016 1630   PROT 6.3 (L) 04/21/2016 1630   ALBUMIN 3.9 04/21/2016 1630   AST 25 04/21/2016 1630   ALT 21 04/21/2016 1630   ALKPHOS 60 04/21/2016 1630   BILITOT 1.2 04/21/2016 1630   GFRNONAA >60 04/21/2016 1630   GFRAA >60 04/21/2016 1630   No results found for: CHOL, HDL, LDLCALC, LDLDIRECT, TRIG, CHOLHDL Lab Results  Component Value Date   HGBA1C 6.6 (H) 10/24/2014   No results found for: [id number] Lab Results  Component Value Date   TSH 1.402 10/24/2014       ASSESSMENT AND PLAN  Myasthenia gravis ([place]) - Plan: CT SOFT TISSUE NECK W WO CONTRAST, CT CHEST W CONTRAST  Ptosis of both eyelids   In summary, Mr. Boisseau is a 80 year old man who began to note ptosis without diplopia or weakness several weeks ago. Symptoms improved with Mestinon. Laboratory tests supports the diagnosis of myasthenia  gravis. 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 Many times, people with ocular myasthenia would not progress to have general weakness, though this may still occur. He is doing well on Mestinon and we will continue at the current dose of 30 mg 3 times a day but he can increase this to as much as 60 mg by mouth 3  times a day if needed.  Myasthenia gravis can be associated with thymoma and thymectomy can be curative we will check a CT scan of the neck and upper chest to make sure that there is no thymoma.  We will call him with the results of the CT and schedule surgical consultation if a thymoma is identified. Otherwise, he will continue the Mestinon and return to see me in several months. He is advised to give [place] a call if he notes any worsening weakness or other symptoms and we can bring him back for a visit sooner.   If he worsens, I would consider the addition of steroids and azathioprine. He has a dramatic worsening of symptoms IVIG or other therapies may also be needed.  Thank you for asking me to see Mr. Roselie Awkward for neurologic consultation. Please let me know if I can be of further assistance with her or other patients in the future.   Richard A. Felecia Shelling, MD, PhD [id number], [id number] AM Certified in Neurology, Clinical Neurophysiology, Sleep Medicine, Pain Medicine and Neuroimaging  [facility] [address], [place], [place] [address] [phone]

## 2016-05-01 NOTE — Addendum Note (Signed)
Addended by: France Ravens I on: 05/01/2016 03:32 PM   Modules accepted: Orders

## 2016-05-02 ENCOUNTER — Telehealth: Payer: Self-pay | Admitting: Neurology

## 2016-05-06 ENCOUNTER — Other Ambulatory Visit: Payer: Medicare Other

## 2016-05-06 ENCOUNTER — Ambulatory Visit
Admission: RE | Admit: 2016-05-06 | Discharge: 2016-05-06 | Disposition: A | Discharge status: Home / Self Care | Payer: Medicare Other | Source: Ambulatory Visit | Attending: Neurology | Admitting: Neurology

## 2016-05-06 DIAGNOSIS — G7000 Myasthenia gravis without (acute) exacerbation: Secondary | ICD-10-CM

## 2016-05-06 DIAGNOSIS — C439 Malignant melanoma of skin, unspecified: Secondary | ICD-10-CM | POA: Diagnosis not present

## 2016-05-06 MED ORDER — IOPAMIDOL (ISOVUE-300) INJECTION 61%
75.0000 mL | Freq: Once | INTRAVENOUS | Status: AC | PRN
  Administered 2016-05-06: 75 mL via INTRAVENOUS

## 2016-05-06 NOTE — Telephone Encounter (Signed)
error 

## 2016-05-07 ENCOUNTER — Telehealth: Payer: Self-pay | Admitting: *Deleted

## 2016-05-07 NOTE — Telephone Encounter (Signed)
-----   Message from Britt Bottom, MD sent at 05/06/2016  4:46 PM EDT ----- Please let him know that the CT scan did not show any evidence of a thymoma so I would not suggest any surgery.     However, there was a small nodule in the lung and the radiologist recommends that we recheck this in one year if he was a smoker. He would not have to recheck if he has no significant history of smoking.

## 2016-05-07 NOTE — Telephone Encounter (Signed)
LMTC./fim 

## 2016-05-08 ENCOUNTER — Telehealth: Payer: Self-pay | Admitting: *Deleted

## 2016-05-08 NOTE — Telephone Encounter (Signed)
I have spoken with David [name] this morning, and per RAS, advised that CT showed no evidence of thymoma, so he does not rec. surgery.  CT did show a nodule in his left lower lung.  He sts he was a smoker, but quit about 40 yrs. ago.  Given hx. of smoking, the radiologist rec. repeat CT in one yr.  Pt. verbalized understanding of same.  I have mailed a copy of the CT report to his home address for his records.  He will share this with his pcp as well/fim

## 2016-05-08 NOTE — Telephone Encounter (Signed)
-----   Message from Britt Bottom, MD sent at 05/06/2016  4:46 PM EDT ----- Please let him know that the CT scan did not show any evidence of a thymoma so I would not suggest any surgery.     However, there was a small nodule in the lung and the radiologist recommends that we recheck this in one year if he was a smoker. He would not have to recheck if he has no significant history of smoking.

## 2016-05-15 ENCOUNTER — Ambulatory Visit: Payer: Medicare Other | Admitting: Cardiovascular Disease

## 2016-05-15 ENCOUNTER — Encounter: Payer: Self-pay | Admitting: Cardiology

## 2016-05-15 DIAGNOSIS — S42255D Nondisplaced fracture of greater tuberosity of left humerus, subsequent encounter for fracture with routine healing: Secondary | ICD-10-CM | POA: Diagnosis not present

## 2016-05-15 DIAGNOSIS — M25512 Pain in left shoulder: Secondary | ICD-10-CM | POA: Diagnosis not present

## 2016-05-15 DIAGNOSIS — M25612 Stiffness of left shoulder, not elsewhere classified: Secondary | ICD-10-CM | POA: Diagnosis not present

## 2016-05-15 DIAGNOSIS — S42294D Other nondisplaced fracture of upper end of right humerus, subsequent encounter for fracture with routine healing: Secondary | ICD-10-CM | POA: Diagnosis not present

## 2016-05-20 ENCOUNTER — Ambulatory Visit: Payer: Medicare Other | Admitting: Cardiovascular Disease

## 2016-05-22 DIAGNOSIS — S4292XD Fracture of left shoulder girdle, part unspecified, subsequent encounter for fracture with routine healing: Secondary | ICD-10-CM | POA: Diagnosis not present

## 2016-06-03 DIAGNOSIS — S4292XA Fracture of left shoulder girdle, part unspecified, initial encounter for closed fracture: Secondary | ICD-10-CM | POA: Diagnosis not present

## 2016-06-09 DIAGNOSIS — G8911 Acute pain due to trauma: Secondary | ICD-10-CM | POA: Diagnosis not present

## 2016-06-09 DIAGNOSIS — M25612 Stiffness of left shoulder, not elsewhere classified: Secondary | ICD-10-CM | POA: Diagnosis not present

## 2016-06-09 DIAGNOSIS — S42255D Nondisplaced fracture of greater tuberosity of left humerus, subsequent encounter for fracture with routine healing: Secondary | ICD-10-CM | POA: Diagnosis not present

## 2016-06-09 DIAGNOSIS — M25552 Pain in left hip: Secondary | ICD-10-CM | POA: Diagnosis not present

## 2016-06-09 DIAGNOSIS — M25512 Pain in left shoulder: Secondary | ICD-10-CM | POA: Diagnosis not present

## 2016-06-10 DIAGNOSIS — M25561 Pain in right knee: Secondary | ICD-10-CM | POA: Diagnosis not present

## 2016-07-01 DIAGNOSIS — L821 Other seborrheic keratosis: Secondary | ICD-10-CM | POA: Diagnosis not present

## 2016-07-01 DIAGNOSIS — L570 Actinic keratosis: Secondary | ICD-10-CM | POA: Diagnosis not present

## 2016-07-01 DIAGNOSIS — Z85828 Personal history of other malignant neoplasm of skin: Secondary | ICD-10-CM | POA: Diagnosis not present

## 2016-07-01 DIAGNOSIS — L820 Inflamed seborrheic keratosis: Secondary | ICD-10-CM | POA: Diagnosis not present

## 2016-07-01 DIAGNOSIS — D1801 Hemangioma of skin and subcutaneous tissue: Secondary | ICD-10-CM | POA: Diagnosis not present

## 2016-07-01 DIAGNOSIS — D225 Melanocytic nevi of trunk: Secondary | ICD-10-CM | POA: Diagnosis not present

## 2016-07-19 DIAGNOSIS — Z888 Allergy status to other drugs, medicaments and biological substances status: Secondary | ICD-10-CM | POA: Diagnosis not present

## 2016-07-19 DIAGNOSIS — Z950 Presence of cardiac pacemaker: Secondary | ICD-10-CM | POA: Diagnosis not present

## 2016-07-19 DIAGNOSIS — E785 Hyperlipidemia, unspecified: Secondary | ICD-10-CM | POA: Diagnosis not present

## 2016-07-19 DIAGNOSIS — G7000 Myasthenia gravis without (acute) exacerbation: Secondary | ICD-10-CM | POA: Diagnosis not present

## 2016-07-19 DIAGNOSIS — Z7982 Long term (current) use of aspirin: Secondary | ICD-10-CM | POA: Diagnosis not present

## 2016-07-19 DIAGNOSIS — I10 Essential (primary) hypertension: Secondary | ICD-10-CM | POA: Diagnosis not present

## 2016-07-19 DIAGNOSIS — K047 Periapical abscess without sinus: Secondary | ICD-10-CM | POA: Diagnosis not present

## 2016-07-19 DIAGNOSIS — I2510 Atherosclerotic heart disease of native coronary artery without angina pectoris: Secondary | ICD-10-CM | POA: Diagnosis not present

## 2016-07-19 DIAGNOSIS — Z955 Presence of coronary angioplasty implant and graft: Secondary | ICD-10-CM | POA: Diagnosis not present

## 2016-07-19 DIAGNOSIS — K1121 Acute sialoadenitis: Secondary | ICD-10-CM | POA: Diagnosis not present

## 2016-07-20 DIAGNOSIS — K047 Periapical abscess without sinus: Secondary | ICD-10-CM | POA: Diagnosis not present

## 2016-07-22 DIAGNOSIS — K1121 Acute sialoadenitis: Secondary | ICD-10-CM | POA: Diagnosis not present

## 2016-07-25 ENCOUNTER — Telehealth: Payer: Self-pay | Admitting: Cardiology

## 2016-07-25 ENCOUNTER — Encounter: Payer: Medicare Other | Admitting: *Deleted

## 2016-07-25 NOTE — Telephone Encounter (Signed)
LMOVM reminding pt to send remote transmission.   

## 2016-08-01 ENCOUNTER — Encounter: Payer: Self-pay | Admitting: Cardiology

## 2016-08-08 ENCOUNTER — Ambulatory Visit (INDEPENDENT_AMBULATORY_CARE_PROVIDER_SITE_OTHER): Payer: Medicare Other | Admitting: *Deleted

## 2016-08-08 DIAGNOSIS — I442 Atrioventricular block, complete: Secondary | ICD-10-CM | POA: Diagnosis not present

## 2016-08-12 ENCOUNTER — Encounter: Payer: Self-pay | Admitting: Neurology

## 2016-08-12 ENCOUNTER — Ambulatory Visit (INDEPENDENT_AMBULATORY_CARE_PROVIDER_SITE_OTHER): Payer: Medicare Other | Admitting: Neurology

## 2016-08-12 VITALS — BP 134/85 | HR 79 | Resp 8 | Ht 69.0 in | Wt 210.0 lb

## 2016-08-12 DIAGNOSIS — K1121 Acute sialoadenitis: Secondary | ICD-10-CM | POA: Diagnosis not present

## 2016-08-12 DIAGNOSIS — G7000 Myasthenia gravis without (acute) exacerbation: Secondary | ICD-10-CM | POA: Diagnosis not present

## 2016-08-12 DIAGNOSIS — H02403 Unspecified ptosis of bilateral eyelids: Secondary | ICD-10-CM

## 2016-08-12 DIAGNOSIS — D3705 Neoplasm of uncertain behavior of pharynx: Secondary | ICD-10-CM | POA: Diagnosis not present

## 2016-08-12 NOTE — Progress Notes (Signed)
[facility]  PATIENT: David [name] DOB: March 10, 1936  REFERRING DOCTOR OR PCP:  [name] SOURCE: Patient, records from Dr. Shelia Media, Lab resulkts  _________________________________   HISTORICAL  CHIEF COMPLAINT:  Chief Complaint  Patient presents with  . Myasthenia Gravis    Sts. is doint well on Mestinon 30mg  tid./fim    HISTORY OF PRESENT ILLNESS:  David [name]  is a 81 year old man with ocular myasthenia.  His ptosis improved on mestinon 30 mg po tid.   Higher dose caused diarrhea.   Recently he had swelling in the left face (salivary gland).      MG:  He feels he is doing well on Mestinon 30 mg by mouth 3 times a day. He saw an endodontist and was referred to ENT last month and was placed on an antibiotic.  He is much better.     He denies difficulties with proximal or distal muscle strength. There is no shoulder or hip weakness and he denies any difficulty rising from a chair or a squatting position.  We had checked CT scan of the chest and neck to rule out thymoma.   None was found.   There was asymmetry with possible soft tissue mass involving the right vallecula.    He is not a smoker or drinker or heavy drinker.    MG History:   He had the onset of ptosis, while traveling in [place] and [place], about 3 weeks ago. He first noted the ptosis while trying to watch a movie in the bus (screen was up high) and noting that the eyelids were covering his vision.  He did note the trip was stressful and he and his wife both had injuries (he sprained ankle and wife broke shoulder).   Lab work was performed and the acetylcholine binding antibodies were elevated at 1.85 (less than 0.24 normal). 57% were blocking antibodies and 25% or modulating antibodies.   Other lab work was noncontributory.     CT scan of the head showed age-related mild chronic microvascular ischemic change.      REVIEW OF SYSTEMS: Constitutional: No fevers, chills, sweats, or change in  appetite Eyes: No visual changes, double vision, eye pain Ear, nose and throat: No hearing loss, ear pain, nasal congestion, sore throat Cardiovascular: No chest pain, palpitations Respiratory: No shortness of breath at rest or with exertion.   No wheezes GastrointestinaI: No nausea, vomiting, diarrhea, abdominal pain, fecal incontinence Genitourinary: No dysuria, urinary retention or frequency.  No nocturia. Musculoskeletal: No neck pain, back pain Integumentary: No rash, pruritus, skin lesions Neurological: as above Psychiatric: No depression at this time.  No anxiety Endocrine: No palpitations, diaphoresis, change in appetite, change in weigh or increased thirst Hematologic/Lymphatic: No anemia, purpura, petechiae. Allergic/Immunologic: No itchy/runny eyes, nasal congestion, recent allergic reactions, rashes  ALLERGIES: Allergies  Allergen Reactions  . Gris-Peg [Griseofulvin] Other (See Comments)    headaches    HOME MEDICATIONS:  Current Outpatient Prescriptions:  .  aspirin EC 81 MG tablet, Take 81 mg by mouth daily. , Disp: , Rfl:  .  ezetimibe-simvastatin (VYTORIN) 10-40 MG per tablet, Take 1 tablet by mouth at bedtime., Disp: , Rfl:  .  fish oil-omega-3 fatty acids 1000 MG capsule, Take 1 g by mouth daily. , Disp: , Rfl:  .  glucosamine-chondroitin 500-400 MG tablet, Take 1 tablet by mouth daily. , Disp: , Rfl:  .  Meth-Hyo-M Bl-Na Phos-Ph Sal (URIBEL) 118 MG CAPS, Take 1 capsule by mouth daily as needed (  urinary burning sensations). , Disp: , Rfl:  .  Multiple Vitamins-Minerals (MULTIVITAMIN WITH MINERALS) tablet, Take 1 tablet by mouth daily., Disp: , Rfl:  .  nitroGLYCERIN (NITROSTAT) 0.4 MG SL tablet, Place 1 tablet (0.4 mg total) under the tongue every 5 (five) minutes as needed for chest pain. Reported on 11/01/2015, Disp: 25 tablet, Rfl: 1 .  pyridostigmine (MESTINON) 60 MG tablet, 1/2 to 1 pill po three times a day, Disp: 90 tablet, Rfl: 11 .  tamsulosin (FLOMAX)  0.4 MG CAPS capsule, Take 0.4 mg by mouth daily. Reported on 11/01/2015, Disp: , Rfl:  .  triamterene-hydrochlorothiazide (MAXZIDE-25) 37.5-25 MG per tablet, Take 1 tablet by mouth daily., Disp: , Rfl:  .  vitamin C (ASCORBIC ACID) 250 MG tablet, Take 250 mg by mouth daily., Disp: , Rfl:   PAST MEDICAL HISTORY: Past Medical History:  Diagnosis Date  . CAD (coronary artery disease)    previous stent to LAD 2003  . Cancer ([place])    skin  . Hyperlipidemia   . Hypertension   . LBBB (left bundle branch block)   . S/P cardiac pacemaker procedure, placement 10/24/14 Medtronic Adapta L model ADDRL 1  10/25/2014    PAST SURGICAL HISTORY: Past Surgical History:  Procedure Laterality Date  . CORONARY ANGIOPLASTY WITH STENT PLACEMENT  03/01/2002   stenting to LAD - Dr. Glade Lloyd  . NM MYOCAR PERF WALL MOTION  11/27/2010   normal  . PERMANENT PACEMAKER INSERTION N/A 10/24/2014   Procedure: PERMANENT PACEMAKER INSERTION;  Surgeon: [name], MD;  [facility];  Service: Cardiovascular;  Laterality: N/A;  . TONSILLECTOMY    . US ECHOCARDIOGRAPHY  12/05/2010   proximal septal thickening,mild concentric LVH,mild deptal hypokinesis,RV mildly dilated,LA mildly dilated,mild AI,moderate aortic root dilatation,septal motion c/w conduction abnormality    FAMILY HISTORY: Family History  Problem Relation Age of Onset  . Breast cancer Mother   . Colon cancer Father   . Lung cancer Father     SOCIAL HISTORY:  Social History   Social History  . Marital status: Married    Spouse name: N/A  . Number of children: N/A  . Years of education: N/A   Occupational History  . Not on file.   Social History Main Topics  . Smoking status: Former Smoker    Types: Cigarettes, Cigars    Quit date: 10/27/1982  . Smokeless tobacco: Never Used  . Alcohol use Yes     Comment: 1-2 drink per week  . Drug use: No  . Sexual activity: Not on file   Other Topics Concern  . Not on file   Social History  Narrative  . No narrative on file     PHYSICAL EXAM  Vitals:   08/12/16 1128  BP: 134/85  Pulse: 79  Resp: (!) 8  Weight: 210 lb (95.3 kg)  Height: 5\' 9"  (1.753 m)    Body mass index is 31.01 kg/m.   General: The patient is well-developed and well-nourished and in no acute distress  HEENT:   Pharynx looks normal.    Cardiovascular: The heart has a regular rate and rhythm with a normal S1 and S2. There were no murmurs, gallops or rubs. Lungs are clear to auscultation.  Skin: Extremities are without significant edema.  Musculoskeletal:  Back is nontender  Neurologic Exam  Mental status: The patient is alert and oriented x 3 at the time of the examination. The patient has apparent normal recent and remote memory, with an apparently normal  attention span and concentration ability.   Speech is normal.  Cranial nerves: Extraocular movements are full. Pupils are equal, round, and reactive to light and accomodation.  Visual fields are full.  Facial symmetry is present. There is good facial sensation to soft touch bilaterally.Facial strength is normal.  Trapezius and sternocleidomastoid strength is normal. No dysarthria is noted.  The tongue is midline, and the patient has symmetric elevation of the soft palate. No obvious hearing deficits are noted.  Motor:  Muscle bulk is normal.   Tone is normal. Strength is  5 / 5 in all 4 extremities.   Sensory: Sensory testing is intact to pinprick, soft touch and vibration sensation in all 4 extremities.  Coordination: Cerebellar testing reveals good finger-nose-finger and heel-to-shin bilaterally.  Gait and station: Station is normal.   Gait is normal. Tandem gait is normal for age (mildly wide). Romberg is negative.   Reflexes: Deep tendon reflexes are symmetric and normal bilaterally.   Plantar responses are flexor.    DIAGNOSTIC DATA (LABS, IMAGING, TESTING) - I reviewed patient records, labs, notes, testing and imaging myself where  available.  Lab Results  Component Value Date   WBC 6.1 04/21/2016   HGB 14.3 04/21/2016   HCT 42.0 04/21/2016   MCV 93.9 04/21/2016   PLT 228 04/21/2016      Component Value Date/Time   NA 141 04/21/2016 1651   K 3.4 (L) 04/21/2016 1651   CL 101 04/21/2016 1651   CO2 27 04/21/2016 1630   GLUCOSE 99 04/21/2016 1651   BUN 21 (H) 04/21/2016 1651   CREATININE 0.90 04/21/2016 1651   CALCIUM 9.0 04/21/2016 1630   PROT 6.3 (L) 04/21/2016 1630   ALBUMIN 3.9 04/21/2016 1630   AST 25 04/21/2016 1630   ALT 21 04/21/2016 1630   ALKPHOS 60 04/21/2016 1630   BILITOT 1.2 04/21/2016 1630   GFRNONAA >60 04/21/2016 1630   GFRAA >60 04/21/2016 1630   No results found for: CHOL, HDL, LDLCALC, LDLDIRECT, TRIG, CHOLHDL Lab Results  Component Value Date   HGBA1C 6.6 (H) 10/24/2014   No results found for: [id number] Lab Results  Component Value Date   TSH 1.402 10/24/2014       ASSESSMENT AND PLAN  Myasthenia gravis ([facility])  Ptosis of both eyelids   1..   Continue Mestinon 30 mg 2 or 3 times a day. He is thinking about trying to see in twice a day dosing will control his symptoms as he often forgets the middle if stable for a while, I would consider reducing him at the next visit. If he worsens, we need to consider immunotherapy. 2. He will take a copy of the CT report to his ENT doctor (has appointment this afternoon).   I highlighted the section describing the possible soft tissue mass in the right vallecula to make sure evaluation this, if they feel it is necessary. 3.   He will return to see me in 9 months but call sooner if he has new or worsening neurologic symptoms.   Keyri Salberg A. Felecia Shelling, MD, PhD [id number], [id number] PM Certified in Neurology, Clinical Neurophysiology, Sleep Medicine, Pain Medicine and Neuroimaging  [facility] [address] [place], [place] [address] [phone]

## 2016-08-15 NOTE — Progress Notes (Signed)
Remote pacemaker transmission.   

## 2016-08-18 LAB — CUP PACEART REMOTE DEVICE CHECK
Battery Impedance: 114 Ohm
Battery Remaining Longevity: 134 mo
Battery Voltage: 2.78 V
Brady Statistic AP VP Percent: 4 %
Brady Statistic AP VS Percent: 0 %
Brady Statistic AS VP Percent: 96 %
Brady Statistic AS VS Percent: 0 %
Date Time Interrogation Session: 20180112182418
Implantable Lead Implant Date: 20160329
Implantable Lead Implant Date: 20160329
Implantable Lead Location: 753859
Implantable Lead Location: 753860
Implantable Lead Model: 5076
Implantable Lead Model: 5092
Implantable Pulse Generator Implant Date: 20160329
Lead Channel Impedance Value: 498 Ohm
Lead Channel Impedance Value: 630 Ohm
Lead Channel Pacing Threshold Amplitude: 0.5 V
Lead Channel Pacing Threshold Amplitude: 0.625 V
Lead Channel Pacing Threshold Pulse Width: 0.4 ms
Lead Channel Pacing Threshold Pulse Width: 0.4 ms
Lead Channel Setting Pacing Amplitude: 2 V
Lead Channel Setting Pacing Amplitude: 2.5 V
Lead Channel Setting Pacing Pulse Width: 0.4 ms
Lead Channel Setting Sensing Sensitivity: 2.8 mV

## 2016-08-20 ENCOUNTER — Encounter: Payer: Self-pay | Admitting: Cardiology

## 2016-09-09 DIAGNOSIS — R229 Localized swelling, mass and lump, unspecified: Secondary | ICD-10-CM | POA: Diagnosis not present

## 2016-09-10 DIAGNOSIS — L570 Actinic keratosis: Secondary | ICD-10-CM | POA: Diagnosis not present

## 2016-09-10 DIAGNOSIS — Z85828 Personal history of other malignant neoplasm of skin: Secondary | ICD-10-CM | POA: Diagnosis not present

## 2016-10-14 DIAGNOSIS — D3705 Neoplasm of uncertain behavior of pharynx: Secondary | ICD-10-CM | POA: Diagnosis not present

## 2016-11-05 DIAGNOSIS — E119 Type 2 diabetes mellitus without complications: Secondary | ICD-10-CM | POA: Diagnosis not present

## 2016-11-05 DIAGNOSIS — Z0000 Encounter for general adult medical examination without abnormal findings: Secondary | ICD-10-CM | POA: Diagnosis not present

## 2016-11-05 DIAGNOSIS — Z125 Encounter for screening for malignant neoplasm of prostate: Secondary | ICD-10-CM | POA: Diagnosis not present

## 2016-11-07 DIAGNOSIS — Z9989 Dependence on other enabling machines and devices: Secondary | ICD-10-CM | POA: Diagnosis not present

## 2016-11-07 DIAGNOSIS — Z8781 Personal history of (healed) traumatic fracture: Secondary | ICD-10-CM | POA: Diagnosis not present

## 2016-11-07 DIAGNOSIS — I459 Conduction disorder, unspecified: Secondary | ICD-10-CM | POA: Diagnosis not present

## 2016-11-07 DIAGNOSIS — G4730 Sleep apnea, unspecified: Secondary | ICD-10-CM | POA: Diagnosis not present

## 2016-11-13 ENCOUNTER — Ambulatory Visit (INDEPENDENT_AMBULATORY_CARE_PROVIDER_SITE_OTHER): Payer: Medicare Other | Admitting: *Deleted

## 2016-11-13 ENCOUNTER — Telehealth: Payer: Self-pay | Admitting: Cardiology

## 2016-11-13 DIAGNOSIS — I442 Atrioventricular block, complete: Secondary | ICD-10-CM | POA: Diagnosis not present

## 2016-11-13 NOTE — Telephone Encounter (Signed)
Spoke with pt and reminded pt of remote transmission that is due today. Pt verbalized understanding.   

## 2016-11-14 ENCOUNTER — Encounter: Payer: Self-pay | Admitting: Cardiology

## 2016-11-14 NOTE — Progress Notes (Signed)
Remote pacemaker transmission.   

## 2016-11-15 LAB — CUP PACEART REMOTE DEVICE CHECK
Battery Impedance: 138 Ohm
Battery Remaining Longevity: 126 mo
Battery Voltage: 2.79 V
Brady Statistic AP VP Percent: 4 %
Brady Statistic AP VS Percent: 0 %
Brady Statistic AS VP Percent: 96 %
Brady Statistic AS VS Percent: 0 %
Date Time Interrogation Session: 20180420154410
Implantable Lead Implant Date: 20160329
Implantable Lead Implant Date: 20160329
Implantable Lead Location: 753859
Implantable Lead Location: 753860
Implantable Lead Model: 5076
Implantable Lead Model: 5092
Implantable Pulse Generator Implant Date: 20160329
Lead Channel Impedance Value: 420 Ohm
Lead Channel Impedance Value: 607 Ohm
Lead Channel Pacing Threshold Amplitude: 0.5 V
Lead Channel Pacing Threshold Amplitude: 0.625 V
Lead Channel Pacing Threshold Pulse Width: 0.4 ms
Lead Channel Pacing Threshold Pulse Width: 0.4 ms
Lead Channel Setting Pacing Amplitude: 2 V
Lead Channel Setting Pacing Amplitude: 2.5 V
Lead Channel Setting Pacing Pulse Width: 0.4 ms
Lead Channel Setting Sensing Sensitivity: 2.8 mV

## 2016-12-09 DIAGNOSIS — G5781 Other specified mononeuropathies of right lower limb: Secondary | ICD-10-CM | POA: Diagnosis not present

## 2016-12-09 DIAGNOSIS — M79604 Pain in right leg: Secondary | ICD-10-CM | POA: Diagnosis not present

## 2016-12-29 DIAGNOSIS — M79604 Pain in right leg: Secondary | ICD-10-CM | POA: Diagnosis not present

## 2016-12-29 DIAGNOSIS — G5781 Other specified mononeuropathies of right lower limb: Secondary | ICD-10-CM | POA: Diagnosis not present

## 2016-12-31 ENCOUNTER — Encounter: Payer: Self-pay | Admitting: Neurology

## 2016-12-31 ENCOUNTER — Ambulatory Visit (INDEPENDENT_AMBULATORY_CARE_PROVIDER_SITE_OTHER): Payer: Medicare Other | Admitting: Neurology

## 2016-12-31 DIAGNOSIS — J387 Other diseases of larynx: Secondary | ICD-10-CM | POA: Diagnosis not present

## 2016-12-31 DIAGNOSIS — G7000 Myasthenia gravis without (acute) exacerbation: Secondary | ICD-10-CM

## 2016-12-31 DIAGNOSIS — G4733 Obstructive sleep apnea (adult) (pediatric): Secondary | ICD-10-CM | POA: Diagnosis not present

## 2016-12-31 DIAGNOSIS — H02403 Unspecified ptosis of bilateral eyelids: Secondary | ICD-10-CM | POA: Diagnosis not present

## 2016-12-31 NOTE — Progress Notes (Signed)
[facility]  PATIENT: David [name] DOB: 06-Mar-1936  REFERRING DOCTOR OR PCP:  David [name] (PCP) and David [name] (Ophtho) SOURCE: Patient, records from David [name], Lab results, CPAP download  _________________________________   HISTORICAL  CHIEF COMPLAINT:  Chief Complaint  Patient presents with  . Myasthenia Gravis    Sts. he is tolerating Mestinon well.  Follwed up with ENT regarding soft tissue mass in neck "they didn't find anything."  Here today to transfer care of OSA/fim    HISTORY OF PRESENT ILLNESS:  David [name]  is a 81 year old man with ocular myasthenia and OSA.     MG:    His ptosis improved with Mestinon. He had trouble tolerating higher dosages but notes continued clinical benefit benefit at a relatively low dose of 30 mg 2-3 times a day (usually takes twice a day but will take a third pill if he could be out of the house).    Higher dose caused diarrhea.    He denies any weakness in the shoulders or hips. He is able to stand up from a chair multiple times without using his arms he can stand up from a squatting position.    Vallecular mass?:   We had checked CT scan of the chest and neck to rule out thymoma.   None was found.   However, there was asymmetry with the possible soft tissue mass involving the right vallecular.      He is not a smoker or drinker or heavy drinker.    He saw David [name] of ENT and had a scope and nothing   OSA;  He has OSA ans is on CPAP.    PSG 01/07/99 showed an RDI = 99 and PSG 2004 showed RDI = 78.   He has a new machine since 2013 (Resmed).    He stopped CPAP 2 years ago because of comfort issues.     He occasionally nods off due to EDS but usually does not.      MG History:   He had the onset of ptosis, while traveling in [place] and [place], about 3 weeks ago. He first noted the ptosis while trying to watch a movie in the bus (screen was up high) and noting that the eyelids were covering his vision.  He did note  the trip was stressful and he and his wife both had injuries (he sprained ankle and wife broke shoulder).   Lab work was performed and the acetylcholine binding antibodies were elevated at 1.85 (less than 0.24 normal). 57% were blocking antibodies and 25% or modulating antibodies.   Other lab work was noncontributory.     CT scan of the head showed age-related mild chronic microvascular ischemic change.      REVIEW OF SYSTEMS: Constitutional: No fevers, chills, sweats, or change in appetite Eyes: No visual changes, double vision, eye pain Ear, nose and throat: No hearing loss, ear pain, nasal congestion, sore throat Cardiovascular: No chest pain, palpitations Respiratory: No shortness of breath at rest or with exertion.   No wheezes GastrointestinaI: No nausea, vomiting, diarrhea, abdominal pain, fecal incontinence Genitourinary: No dysuria, urinary retention or frequency.  No nocturia. Musculoskeletal: No neck pain, back pain Integumentary: No rash, pruritus, skin lesions Neurological: as above Psychiatric: No depression at this time.  No anxiety Endocrine: No palpitations, diaphoresis, change in appetite, change in weigh or increased thirst Hematologic/Lymphatic: No anemia, purpura, petechiae. Allergic/Immunologic: No itchy/runny eyes, nasal congestion, recent allergic reactions, rashes  ALLERGIES: Allergies  Allergen Reactions  . Gris-Peg [Griseofulvin] Other (See Comments)    headaches    HOME MEDICATIONS:  Current Outpatient Prescriptions:  .  aspirin EC 81 MG tablet, Take 81 mg by mouth daily. , Disp: , Rfl:  .  ezetimibe-simvastatin (VYTORIN) 10-40 MG per tablet, Take 1 tablet by mouth at bedtime., Disp: , Rfl:  .  fish oil-omega-3 fatty acids 1000 MG capsule, Take 1 g by mouth daily. , Disp: , Rfl:  .  glucosamine-chondroitin 500-400 MG tablet, Take 1 tablet by mouth daily. , Disp: , Rfl:  .  loratadine (CLARITIN) 10 MG tablet, Take 10 mg by mouth daily., Disp: , Rfl:    .  Meth-Hyo-M Bl-Na Phos-Ph Sal (URIBEL) 118 MG CAPS, Take 1 capsule by mouth daily as needed (urinary burning sensations). , Disp: , Rfl:  .  Multiple Vitamins-Minerals (MULTIVITAMIN WITH MINERALS) tablet, Take 1 tablet by mouth daily., Disp: , Rfl:  .  nitroGLYCERIN (NITROSTAT) 0.4 MG SL tablet, Place 1 tablet (0.4 mg total) under the tongue every 5 (five) minutes as needed for chest pain. Reported on 11/01/2015, Disp: 25 tablet, Rfl: 1 .  pyridostigmine (MESTINON) 60 MG tablet, 1/2 to 1 pill po three times a day, Disp: 90 tablet, Rfl: 11 .  tamsulosin (FLOMAX) 0.4 MG CAPS capsule, Take 0.4 mg by mouth daily. Reported on 11/01/2015, Disp: , Rfl:  .  triamterene-hydrochlorothiazide (MAXZIDE-25) 37.5-25 MG per tablet, Take 1 tablet by mouth daily., Disp: , Rfl:  .  vitamin C (ASCORBIC ACID) 250 MG tablet, Take 250 mg by mouth daily., Disp: , Rfl:   PAST MEDICAL HISTORY: Past Medical History:  Diagnosis Date  . CAD (coronary artery disease)    previous stent to LAD 2003  . Cancer ([place])    skin  . Hyperlipidemia   . Hypertension   . LBBB (left bundle branch block)   . S/P cardiac pacemaker procedure, placement 10/24/14 Medtronic Adapta L model ADDRL 1  10/25/2014    PAST SURGICAL HISTORY: Past Surgical History:  Procedure Laterality Date  . CORONARY ANGIOPLASTY WITH STENT PLACEMENT  03/01/2002   stenting to LAD - Dr. Glade [name]  . NM MYOCAR PERF WALL MOTION  11/27/2010   normal  . PERMANENT PACEMAKER INSERTION N/A 10/24/2014   Procedure: PERMANENT PACEMAKER INSERTION;  Surgeon: David Grayer, MD;  Location: [facility] CATH LAB;  Service: Cardiovascular;  Laterality: N/A;  . TONSILLECTOMY    . US ECHOCARDIOGRAPHY  12/05/2010   proximal septal thickening,mild concentric LVH,mild deptal hypokinesis,RV mildly dilated,LA mildly dilated,mild AI,moderate aortic root dilatation,septal motion c/w conduction abnormality    FAMILY HISTORY: Family History  Problem Relation Age of Onset  . Breast cancer Mother    . Colon cancer Father   . Lung cancer Father     SOCIAL HISTORY:  Social History   Social History  . Marital status: Married    Spouse name: N/A  . Number of children: N/A  . Years of education: N/A   Occupational History  . Not on file.   Social History Main Topics  . Smoking status: Former Smoker    Types: Cigarettes, Cigars    Quit date: 10/27/1982  . Smokeless tobacco: Never Used  . Alcohol use Yes     Comment: 1-2 drink per week  . Drug use: No  . Sexual activity: Not on file   Other Topics Concern  . Not on file   Social History Narrative  . No narrative on file     PHYSICAL EXAM  There were no vitals filed for this visit.  There is no height or weight on file to calculate BMI.   General: The patient is well-developed and well-nourished and in no acute distress  HEENT:   Pharynx looks normal.    Cardiovascular: The heart has a regular rate and rhythm with a normal S1 and S2. There were no murmurs, gallops or rubs. Lungs are clear to auscultation.  Skin: Extremities are without significant edema.  Musculoskeletal:  Back is nontender  Neurologic Exam  Mental status: The patient is alert and oriented x 3 at the time of the examination. The patient has apparent normal recent and remote memory, with an apparently normal attention span and concentration ability.   Speech is normal.  Cranial nerves: Extraocular movements are full. Pupils are equal, round, and reactive to light and accomodation.  Visual fields are full.  Facial symmetry is present. There is good facial sensation to soft touch bilaterally.Facial strength is normal.  Trapezius and sternocleidomastoid strength is normal. No dysarthria is noted.  The tongue is midline, and the patient has symmetric elevation of the soft palate. No obvious hearing deficits are noted.  Motor:  Muscle bulk is normal.   Tone is normal. Strength is  5 / 5 in all 4 extremities.   Sensory: Sensory testing is intact to  pinprick, soft touch and vibration sensation in all 4 extremities.  Coordination: Cerebellar testing reveals good finger-nose-finger and heel-to-shin bilaterally.  Gait and station: Station is normal.   Gait is normal. Tandem gait is normal for age (mildly wide). Romberg is negative.   Reflexes: Deep tendon reflexes are symmetric and normal bilaterally.   Plantar responses are flexor.    DIAGNOSTIC DATA (LABS, IMAGING, TESTING) - I reviewed patient records, labs, notes, testing and imaging myself where available.  Lab Results  Component Value Date   WBC 6.1 04/21/2016   HGB 14.3 04/21/2016   HCT 42.0 04/21/2016   MCV 93.9 04/21/2016   PLT 228 04/21/2016      Component Value Date/Time   NA 141 04/21/2016 1651   K 3.4 (L) 04/21/2016 1651   CL 101 04/21/2016 1651   CO2 27 04/21/2016 1630   GLUCOSE 99 04/21/2016 1651   BUN 21 (H) 04/21/2016 1651   CREATININE 0.90 04/21/2016 1651   CALCIUM 9.0 04/21/2016 1630   PROT 6.3 (L) 04/21/2016 1630   ALBUMIN 3.9 04/21/2016 1630   AST 25 04/21/2016 1630   ALT 21 04/21/2016 1630   ALKPHOS 60 04/21/2016 1630   BILITOT 1.2 04/21/2016 1630   GFRNONAA >60 04/21/2016 1630   GFRAA >60 04/21/2016 1630   No results found for: CHOL, HDL, LDLCALC, LDLDIRECT, TRIG, CHOLHDL Lab Results  Component Value Date   HGBA1C 6.6 (H) 10/24/2014   No results found for: VITAMINB12 Lab Results  Component Value Date   TSH 1.402 10/24/2014       ASSESSMENT AND PLAN  Myasthenia gravis (HCC)  Ptosis of both eyelids  Obstructive sleep apnea syndrome  Vallecular mass   1..   He will continue Mestinon 30 mg 2-3 times a day.   If he worsens, we need to consider immunotherapy.    2.    Set up sleep equipment.    He would like to try the Respironics Dreamwear mask medium.    He also liked the Airfit P10 medium he used at first (better than Swift Medium).     He did not like the current company he is using. We will  set up with [employer],   We discussed  the importance of compliance as treating severe sleep apnea may also lower coronary risk 3.    He will return to see me in 12 months but call sooner if he has new or worsening neurologic symptoms.   Jameyah Fennewald A. Felecia Shelling, MD, PhD 09/01/8344, 21:94 AM Certified in Neurology, Clinical Neurophysiology, Sleep Medicine, Pain Medicine and Neuroimaging  [facility] [address] [place], [place] [address] [phone]

## 2017-01-01 NOTE — Progress Notes (Signed)
Cardiology Office Note Date:  01/02/2017  Patient ID:  Unnamed, David [name] 01/24/1936, MRN [id number] PCP:  [name], MD  Cardiologist:  Dr. Claiborne Billings Electrophysiologist: Dr. Rayann Heman   Chief Complaint: annual device visit  History of Present Illness: David [name] is a 81 y.o. male with history of CHB w/PPM, HTN, CAD (remote PCI 2003), LBBB, occular myasthenia gravis following with neurology, OSA pending CPAP delivery, comes in today to be seen for Dr. Rayann Heman, last seen by him June 2017 was doing well.  He reports doing well, though as he ages doesn't feel like he has the same energy as he used to.  He will be starting CPAP tx next week but doesn't think this will help.  He denies any kind of CP, palpitations or SOB, no dizziness, near syncope or syncope.  He does not exercise but takes care of his home/yard without intolerances.  Device information: MDT dual chamber PPM, implanted 10/24/14, Dr. Rayann Heman  Past Medical History:  Diagnosis Date  . CAD (coronary artery disease)    previous stent to LAD 2003  . Cancer ([place])    skin  . Hyperlipidemia   . Hypertension   . LBBB (left bundle branch block)   . S/P cardiac pacemaker procedure, placement 10/24/14 Medtronic Adapta L model ADDRL 1  10/25/2014    Past Surgical History:  Procedure Laterality Date  . CORONARY ANGIOPLASTY WITH STENT PLACEMENT  03/01/2002   stenting to LAD - Dr. Glade Lloyd  . NM MYOCAR PERF WALL MOTION  11/27/2010   normal  . PERMANENT PACEMAKER INSERTION N/A 10/24/2014   Procedure: PERMANENT PACEMAKER INSERTION;  Surgeon: [name], MD;  Location: [facility] CATH LAB;  Service: Cardiovascular;  Laterality: N/A;  . TONSILLECTOMY    . US ECHOCARDIOGRAPHY  12/05/2010   proximal septal thickening,mild concentric LVH,mild deptal hypokinesis,RV mildly dilated,LA mildly dilated,mild AI,moderate aortic root dilatation,septal motion c/w conduction abnormality    Current Outpatient Prescriptions  Medication Sig Dispense  Refill  . aspirin EC 81 MG tablet Take 81 mg by mouth daily.     Marland Kitchen ezetimibe-simvastatin (VYTORIN) 10-40 MG per tablet Take 1 tablet by mouth at bedtime.    . fish oil-omega-3 fatty acids 1000 MG capsule Take 1 g by mouth daily.     Marland Kitchen glucosamine-chondroitin 500-400 MG tablet Take 1 tablet by mouth daily.     Marland Kitchen loratadine (CLARITIN) 10 MG tablet Take 10 mg by mouth daily.    . Meth-Hyo-M Bl-Na Phos-Ph Sal (URIBEL) 118 MG CAPS Take 1 capsule by mouth daily as needed (urinary burning sensations).     . Multiple Vitamins-Minerals (MULTIVITAMIN WITH MINERALS) tablet Take 1 tablet by mouth daily.    . nitroGLYCERIN (NITROSTAT) 0.4 MG SL tablet Place 1 tablet (0.4 mg total) under the tongue every 5 (five) minutes as needed for chest pain. Reported on 11/01/2015 25 tablet 1  . pyridostigmine (MESTINON) 60 MG tablet 1/2 to 1 pill po three times a day 90 tablet 11  . tamsulosin (FLOMAX) 0.4 MG CAPS capsule Take 0.4 mg by mouth daily. Reported on 11/01/2015    . triamterene-hydrochlorothiazide (MAXZIDE-25) 37.5-25 MG per tablet Take 1 tablet by mouth daily.    . vitamin C (ASCORBIC ACID) 250 MG tablet Take 250 mg by mouth daily.     No current facility-administered medications for this visit.     Allergies:   Gris-peg [griseofulvin]   Social History:  The patient  reports that he quit smoking about 34 years ago. His smoking  use included Cigarettes and Cigars. He has never used smokeless tobacco. He reports that he drinks alcohol. He reports that he does not use drugs.   Family History:  The patient's family history includes Breast cancer in his mother; Colon cancer in his father; Lung cancer in his father.  ROS:  Please see the history of present illness.  All other systems are reviewed and otherwise negative.   PHYSICAL EXAM:  VS:  BP 116/72 (BP Location: Left Arm)   Pulse 93   Ht 5\' 9"  (1.753 m)   Wt 220 lb (99.8 kg)   BMI 32.49 kg/m  BMI: Body mass index is 32.49 kg/m. Well nourished, well  developed, in no acute distress  HEENT: normocephalic, atraumatic  Neck: no JVD, carotid bruits or masses Cardiac:  RRR; no significant murmurs, no rubs, or gallops Lungs:  CTA b/l, no wheezing, rhonchi or rales  Abd: soft, nontender, obese MS: no deformity or atrophy Ext: no edema  Skin: warm and dry, no rash Neuro:  No gross deficits appreciated Psych: euthymic mood, full affect  PPM site is stable, no tethering or discomfort  EKG: 04/21/16 is SR, V paced PPM interrogation done today and reviewed by myself: battery and lead measurements are good, HAR longest 67minutes, no EGM, avail event markers look like AT/PACs, NSVT episodes, longest 8 seconds  10/24/14: TTE Study Conclusions - Left ventricle: The cavity size was normal. Wall thickness was increased in a pattern of moderate LVH. Systolic function was normal. The estimated ejection fraction was in the range of 55% to 60%. Although no diagnostic regional wall motion abnormality was identified, this possibility cannot be completely excluded on the basis of this study. Doppler parameters are consistent with abnormal left ventricular relaxation (grade 1 diastolic dysfunction). The E/e&' ratio is between 8-15, suggesting indeterminate LV filling pressure. - Aortic valve: Trileaflet. Transvalvular velocity was within the normal range. There was no stenosis. There was mild regurgitation. - Aorta: Aortic root dimension: 43 mm (ED). - Ascending aorta: The ascending aorta was mildly dilated. - Mitral valve: There was trivial regurgitation. - Left atrium: The atrium was normal in size. - Right atrium: The atrium was at the upper limits of normal in size. - Atrial septum: No defect or patent foramen ovale was identified. - Tricuspid valve: There was moderate regurgitation. - Pulmonary arteries: PA peak pressure: 43 mm Hg (S). - Inferior vena cava: The vessel was normal in size. The respirophasic diameter changes  were in the normal range (>= 50%), consistent with normal central venous pressure. Impressions - LVEF 55-60%, moderate LVH, diastolic dysfunction, mild AI, dilated aortic root up to 4.3 cm, moderate TR, RVSP 43 mmHg.  04/06/15: lexiscan stress myoview Impression Myocardial perfusion is abnormal. This is a low risk study. Overall left ventricular systolic function was abnormal. LV cavity size is mildly enlarged. Nuclear stress EF: 43%. The left ventricular ejection fraction is moderately decreased (30-44%). A prior study was conducted on 11/27/2010.There are no significant changes in comparison to the prior study.       Recent Labs: 04/21/2016: ALT 21; BUN 21; Creatinine, Ser 0.90; Hemoglobin 14.3; Platelets 228; Potassium 3.4; Sodium 141  No results found for requested labs within last 8760 hours.   CrCl cannot be calculated (Patient's most recent lab result is older than the maximum 21 days allowed.).   Wt Readings from Last 3 Encounters:  01/02/17 220 lb (99.8 kg)  08/12/16 210 lb (95.3 kg)  05/01/16 212 lb 8 oz (96.4 kg)  Other studies reviewed: Additional studies/records reviewed today include: summarized above  ASSESSMENT AND PLAN:  1. CHB w/PPM     Intact device function  2. CAD     No anginal complaints     He will c/w Dr. Claiborne Billings  3. HTN     Looks good, no changes  4. A couple NSVT episodes     his last labs were mildly hypokalemic, given diuretic with recheck  Disposition: F/u with Dr. Claiborne Billings as planned by him, will get BMET today, plan Q 25month remote pacer checks and 1 year with Dr. Allred/EP service  Current medicines are reviewed at length with the patient today.  The patient did not have any concerns regarding medicines.  Haywood Lasso, PA-C 01/02/2017 9:18 AM     [facility] [address] [place]  [place] [address] [phone] (office)  [phone] (fax)

## 2017-01-02 ENCOUNTER — Ambulatory Visit (INDEPENDENT_AMBULATORY_CARE_PROVIDER_SITE_OTHER): Payer: Medicare Other | Admitting: Physician Assistant

## 2017-01-02 ENCOUNTER — Encounter: Payer: Self-pay | Admitting: Physician Assistant

## 2017-01-02 VITALS — BP 116/72 | HR 93 | Ht 69.0 in | Wt 220.0 lb

## 2017-01-02 DIAGNOSIS — I2510 Atherosclerotic heart disease of native coronary artery without angina pectoris: Secondary | ICD-10-CM | POA: Diagnosis not present

## 2017-01-02 DIAGNOSIS — Z950 Presence of cardiac pacemaker: Secondary | ICD-10-CM | POA: Diagnosis not present

## 2017-01-02 DIAGNOSIS — I442 Atrioventricular block, complete: Secondary | ICD-10-CM | POA: Diagnosis not present

## 2017-01-02 DIAGNOSIS — I4729 Other ventricular tachycardia: Secondary | ICD-10-CM

## 2017-01-02 DIAGNOSIS — I472 Ventricular tachycardia: Secondary | ICD-10-CM

## 2017-01-02 DIAGNOSIS — I10 Essential (primary) hypertension: Secondary | ICD-10-CM

## 2017-01-02 LAB — BASIC METABOLIC PANEL
BUN/Creatinine Ratio: 23 (ref 10–24)
BUN: 19 mg/dL (ref 8–27)
CO2: 26 mmol/L (ref 18–29)
CREATININE: 0.84 mg/dL (ref 0.76–1.27)
Calcium: 9 mg/dL (ref 8.6–10.2)
Chloride: 101 mmol/L (ref 96–106)
GFR calc non Af Amer: 83 mL/min/{1.73_m2} (ref >59)
GFR, EST AFRICAN AMERICAN: 96 mL/min/{1.73_m2} (ref >59)
Glucose: 131 mg/dL — ABNORMAL HIGH (ref 65–99)
Potassium: 3.8 mmol/L (ref 3.5–5.2)
SODIUM: 141 mmol/L (ref 134–144)

## 2017-01-02 NOTE — Patient Instructions (Addendum)
Medication Instructions:  Your physician recommends that you continue on your current medications as directed. Please refer to the Current Medication list given to you today.   Labwork: Your physician recommends that you return for lab work today for BMET  Testing/Procedures: None Ordered   Follow-Up: Your physician wants you to follow-up in: 1 year with Dr. Rayann Heman. You will receive a reminder letter in the mail two months in advance. If you don't receive a letter, please call our office to schedule the follow-up appointment.    Any Other Special Instructions Will Be Listed Below (If Applicable).     If you need a refill on your cardiac medications before your next appointment, please call your pharmacy.  Thank you for choosing [facility]

## 2017-01-06 DIAGNOSIS — L570 Actinic keratosis: Secondary | ICD-10-CM | POA: Diagnosis not present

## 2017-01-06 DIAGNOSIS — Z85828 Personal history of other malignant neoplasm of skin: Secondary | ICD-10-CM | POA: Diagnosis not present

## 2017-01-06 DIAGNOSIS — D1801 Hemangioma of skin and subcutaneous tissue: Secondary | ICD-10-CM | POA: Diagnosis not present

## 2017-01-06 DIAGNOSIS — L821 Other seborrheic keratosis: Secondary | ICD-10-CM | POA: Diagnosis not present

## 2017-01-15 DIAGNOSIS — H5213 Myopia, bilateral: Secondary | ICD-10-CM | POA: Diagnosis not present

## 2017-02-16 ENCOUNTER — Encounter: Payer: Medicare Other | Admitting: *Deleted

## 2017-02-20 ENCOUNTER — Encounter: Payer: Self-pay | Admitting: Cardiology

## 2017-03-12 ENCOUNTER — Ambulatory Visit (INDEPENDENT_AMBULATORY_CARE_PROVIDER_SITE_OTHER): Payer: Medicare Other | Admitting: *Deleted

## 2017-03-12 DIAGNOSIS — I442 Atrioventricular block, complete: Secondary | ICD-10-CM | POA: Diagnosis not present

## 2017-03-13 NOTE — Progress Notes (Signed)
Remote pacemaker check. 

## 2017-03-19 LAB — CUP PACEART REMOTE DEVICE CHECK
Battery Remaining Longevity: 123 mo
Brady Statistic AP VS Percent: 0 %
Brady Statistic AS VP Percent: 95 %
Brady Statistic AS VS Percent: 0 %
Date Time Interrogation Session: 20180816155233
Implantable Lead Location: 753859
Implantable Lead Location: 753860
Lead Channel Impedance Value: 521 Ohm
Lead Channel Pacing Threshold Amplitude: 0.625 V
Lead Channel Pacing Threshold Pulse Width: 0.4 ms
Lead Channel Setting Pacing Amplitude: 2 V
Lead Channel Setting Pacing Pulse Width: 0.4 ms
Lead Channel Setting Sensing Sensitivity: 2.8 mV
MDC IDC LEAD IMPLANT DT: 20160329
MDC IDC LEAD IMPLANT DT: 20160329
MDC IDC MSMT BATTERY IMPEDANCE: 162 Ohm
MDC IDC MSMT BATTERY VOLTAGE: 2.78 V
MDC IDC MSMT LEADCHNL RA PACING THRESHOLD AMPLITUDE: 0.75 V
MDC IDC MSMT LEADCHNL RA PACING THRESHOLD PULSEWIDTH: 0.4 ms
MDC IDC MSMT LEADCHNL RV IMPEDANCE VALUE: 643 Ohm
MDC IDC PG IMPLANT DT: 20160329
MDC IDC SET LEADCHNL RV PACING AMPLITUDE: 2.5 V
MDC IDC STAT BRADY AP VP PERCENT: 5 %

## 2017-03-27 ENCOUNTER — Encounter: Payer: Self-pay | Admitting: Cardiology

## 2017-04-03 DIAGNOSIS — M67871 Other specified disorders of synovium, right ankle and foot: Secondary | ICD-10-CM | POA: Diagnosis not present

## 2017-04-10 ENCOUNTER — Encounter: Payer: Self-pay | Admitting: Cardiology

## 2017-05-12 ENCOUNTER — Encounter: Payer: Self-pay | Admitting: Neurology

## 2017-05-12 ENCOUNTER — Ambulatory Visit (INDEPENDENT_AMBULATORY_CARE_PROVIDER_SITE_OTHER): Payer: Medicare Other | Admitting: Neurology

## 2017-05-12 VITALS — BP 152/96 | HR 82 | Resp 18 | Ht 69.0 in | Wt 223.5 lb

## 2017-05-12 DIAGNOSIS — E669 Obesity, unspecified: Secondary | ICD-10-CM | POA: Diagnosis not present

## 2017-05-12 DIAGNOSIS — G4733 Obstructive sleep apnea (adult) (pediatric): Secondary | ICD-10-CM | POA: Diagnosis not present

## 2017-05-12 DIAGNOSIS — G7000 Myasthenia gravis without (acute) exacerbation: Secondary | ICD-10-CM

## 2017-05-12 MED ORDER — PYRIDOSTIGMINE BROMIDE 60 MG PO TABS: ORAL_TABLET | ORAL | 11 refills | Status: DC

## 2017-05-12 NOTE — Progress Notes (Signed)
[facility]  PATIENT: David [name] DOB: 10/17/1935  REFERRING DOCTOR OR PCP:  [name] (PCP) and Rutherford Guys (Ophtho) SOURCE: Patient, records from Dr. Shelia Media, Lab results, CPAP download  _________________________________   HISTORICAL  CHIEF COMPLAINT:  Chief Complaint  Patient presents with  . Myasthenia Gravis    Sts. he continues to tolerate Mestinon well.  Sts. he received new mask from [facility], but hasn't tried it yet.  Not currently using CPAP but sts. will try it soon/fim  . Sleep Apnea    HISTORY OF PRESENT ILLNESS:  David [name]  is a 81 year old man with ocular myasthenia and OSA.     Update 05/12/2017:    He feels the MG is doing well.   He takes mestinon 1/2 pill bid and tolerates it well.   He denies diplopia. No proximal weakness.  He has OSA and a new CPAP machine was ordered with a different mask.    He hasn't set it up as the bedroom was moved around (wife had knee surgery).  He snores but does not wake up.   He sleeps 5-6 hours sometimes wakingup once to urinate.   He rarely dozes off unintentionally, only if tired.      _________________________________________ From 12/31/2016  MG:    His ptosis improved with Mestinon. He had trouble tolerating higher dosages but notes continued clinical benefit benefit at a relatively low dose of 30 mg 2-3 times a day (usually takes twice a day but will take a third pill if he could be out of the house).    Higher dose caused diarrhea.    He denies any weakness in the shoulders or hips. He is able to stand up from a chair multiple times without using his arms he can stand up from a squatting position.    Vallecular mass?:   We had checked CT scan of the chest and neck to rule out thymoma.   None was found.   However, there was asymmetry with the possible soft tissue mass involving the right vallecular.      He is not a smoker or drinker or heavy drinker.    He saw Dr. Benjamine Mola of ENT and had  a scope and nothing   OSA;  He has OSA ans is on CPAP.    PSG 01/07/99 showed an RDI = 99 and PSG 2004 showed RDI = 78.   He has a new machine since 2013 ([facility]).    He stopped CPAP 2 years ago because of comfort issues.     He occasionally nods off due to EDS but usually does not.      MG History:   He had the onset of ptosis, while traveling in [place] and [place], about 3 weeks ago. He first noted the ptosis while trying to watch a movie in the bus (screen was up high) and noting that the eyelids were covering his vision.  He did note the trip was stressful and he and his wife both had injuries (he sprained ankle and wife broke shoulder).   Lab work was performed and the acetylcholine binding antibodies were elevated at 1.85 (less than 0.24 normal). 57% were blocking antibodies and 25% or modulating antibodies.   Other lab work was noncontributory.     CT scan of the head showed age-related mild chronic microvascular ischemic change.      REVIEW OF SYSTEMS: Constitutional: No fevers, chills, sweats, or change in appetite Eyes: No visual changes,  double vision, eye pain Ear, nose and throat: No hearing loss, ear pain, nasal congestion, sore throat Cardiovascular: No chest pain, palpitations Respiratory: No shortness of breath at rest or with exertion.   No wheezes GastrointestinaI: No nausea, vomiting, diarrhea, abdominal pain, fecal incontinence Genitourinary: No dysuria, urinary retention or frequency.  No nocturia. Musculoskeletal: No neck pain, back pain Integumentary: No rash, pruritus, skin lesions Neurological: as above Psychiatric: No depression at this time.  No anxiety Endocrine: No palpitations, diaphoresis, change in appetite, change in weigh or increased thirst Hematologic/Lymphatic: No anemia, purpura, petechiae. Allergic/Immunologic: No itchy/runny eyes, nasal congestion, recent allergic reactions, rashes  ALLERGIES: Allergies  Allergen Reactions  . Gris-Peg  [Griseofulvin] Other (See Comments)    headaches    HOME MEDICATIONS:  Current Outpatient Prescriptions:  .  aspirin EC 81 MG tablet, Take 81 mg by mouth daily. , Disp: , Rfl:  .  ezetimibe-simvastatin (VYTORIN) 10-40 MG per tablet, Take 1 tablet by mouth at bedtime., Disp: , Rfl:  .  fish oil-omega-3 fatty acids 1000 MG capsule, Take 1 g by mouth daily. , Disp: , Rfl:  .  glucosamine-chondroitin 500-400 MG tablet, Take 1 tablet by mouth daily. , Disp: , Rfl:  .  loratadine (CLARITIN) 10 MG tablet, Take 10 mg by mouth daily., Disp: , Rfl:  .  Meth-Hyo-M Bl-Na Phos-Ph Sal (URIBEL) 118 MG CAPS, Take 1 capsule by mouth daily as needed (urinary burning sensations). , Disp: , Rfl:  .  Multiple Vitamins-Minerals (MULTIVITAMIN WITH MINERALS) tablet, Take 1 tablet by mouth daily., Disp: , Rfl:  .  nitroGLYCERIN (NITROSTAT) 0.4 MG SL tablet, Place 1 tablet (0.4 mg total) under the tongue every 5 (five) minutes as needed for chest pain. Reported on 11/01/2015, Disp: 25 tablet, Rfl: 1 .  pyridostigmine (MESTINON) 60 MG tablet, 1/2 to 1 pill po three times a day, Disp: 90 tablet, Rfl: 11 .  tamsulosin (FLOMAX) 0.4 MG CAPS capsule, Take 0.4 mg by mouth daily. Reported on 11/01/2015, Disp: , Rfl:  .  triamterene-hydrochlorothiazide (MAXZIDE-25) 37.5-25 MG per tablet, Take 1 tablet by mouth daily., Disp: , Rfl:  .  vitamin C (ASCORBIC ACID) 250 MG tablet, Take 250 mg by mouth daily., Disp: , Rfl:   PAST MEDICAL HISTORY: Past Medical History:  Diagnosis Date  . CAD (coronary artery disease)    previous stent to LAD 2003  . Cancer ([place])    skin  . Hyperlipidemia   . Hypertension   . LBBB (left bundle branch block)   . S/P cardiac pacemaker procedure, placement 10/24/14 Medtronic Adapta L model ADDRL 1  10/25/2014    PAST SURGICAL HISTORY: Past Surgical History:  Procedure Laterality Date  . CORONARY ANGIOPLASTY WITH STENT PLACEMENT  03/01/2002   stenting to LAD - Dr. Glade Lloyd  . NM MYOCAR PERF WALL  MOTION  11/27/2010   normal  . PERMANENT PACEMAKER INSERTION N/A 10/24/2014   Procedure: PERMANENT PACEMAKER INSERTION;  Surgeon: [name], MD;  Location: [facility] CATH LAB;  Service: Cardiovascular;  Laterality: N/A;  . TONSILLECTOMY    . US ECHOCARDIOGRAPHY  12/05/2010   proximal septal thickening,mild concentric LVH,mild deptal hypokinesis,RV mildly dilated,LA mildly dilated,mild AI,moderate aortic root dilatation,septal motion c/w conduction abnormality    FAMILY HISTORY: Family History  Problem Relation Age of Onset  . Breast cancer Mother   . Colon cancer Father   . Lung cancer Father     SOCIAL HISTORY:  Social History   Social History  . Marital status: Married  Spouse name: N/A  . Number of children: N/A  . Years of education: N/A   Occupational History  . Not on file.   Social History Main Topics  . Smoking status: Former Smoker    Types: Cigarettes, Cigars    Quit date: 10/27/1982  . Smokeless tobacco: Never Used  . Alcohol use Yes     Comment: 1-2 drink per week  . Drug use: No  . Sexual activity: Not on file   Other Topics Concern  . Not on file   Social History Narrative  . No narrative on file     PHYSICAL EXAM  Vitals:   05/12/17 0823  BP: (!) 152/96  Pulse: 82  Resp: 18  Weight: 223 lb 8 oz (101.4 kg)  Height: 5\' 9"  (1.753 m)    Body mass index is 33.01 kg/m.   BP recheck 150/85 at 0856   General: The patient is well-developed and well-nourished and in no acute distress  HEENT:   Pharynx looks normal.    Cardiovascular: The heart has a regular rate and rhythm with a normal S1 and S2. There were no murmurs, gallops or rubs.   Neurologic Exam  Mental status: The patient is alert and oriented x 3 at the time of the examination. The patient has apparent normal recent and remote memory, with an apparently normal attention span and concentration ability.   Speech is normal.  Cranial nerves: Extraocular movements are full. Pupils react  equally to light. Facial strength and sensation is normal. Trapezius strength is strong. No dysarthria is noted.  The tongue is midline, and the patient has symmetric elevation of the soft palate. No obvious hearing deficits are noted.  Motor:  Muscle bulk is normal.   Tone is normal. Strength is  5 / 5 in all 4 extremities.   Sensory: Sensory testing is intact to pinprick, soft touch and vibration sensation in all 4 extremities.  Coordination: Cerebellar testing reveals good finger-nose-finger and heel-to-shin bilaterally.  Gait and station: Station is normal.   Gait is normal. Tandem gait is normal for age (mildly wide). Romberg is negative.   Reflexes: Deep tendon reflexes are symmetric and normal bilaterally.   Plantar responses are flexor.    DIAGNOSTIC DATA (LABS, IMAGING, TESTING) - I reviewed patient records, labs, notes, testing and imaging myself where available.  Lab Results  Component Value Date   WBC 6.1 04/21/2016   HGB 14.3 04/21/2016   HCT 42.0 04/21/2016   MCV 93.9 04/21/2016   PLT 228 04/21/2016      Component Value Date/Time   NA 141 01/02/2017 0920   K 3.8 01/02/2017 0920   CL 101 01/02/2017 0920   CO2 26 01/02/2017 0920   GLUCOSE 131 (H) 01/02/2017 0920   GLUCOSE 99 04/21/2016 1651   BUN 19 01/02/2017 0920   CREATININE 0.84 01/02/2017 0920   CALCIUM 9.0 01/02/2017 0920   PROT 6.3 (L) 04/21/2016 1630   ALBUMIN 3.9 04/21/2016 1630   AST 25 04/21/2016 1630   ALT 21 04/21/2016 1630   ALKPHOS 60 04/21/2016 1630   BILITOT 1.2 04/21/2016 1630   GFRNONAA 83 01/02/2017 0920   GFRAA 96 01/02/2017 0920   No results found for: CHOL, HDL, LDLCALC, LDLDIRECT, TRIG, CHOLHDL Lab Results  Component Value Date   HGBA1C 6.6 (H) 10/24/2014   No results found for: [id number] Lab Results  Component Value Date   TSH 1.402 10/24/2014       ASSESSMENT AND PLAN  Myasthenia gravis (  [place])  Obstructive sleep apnea syndrome  Mild obesity   1..  Continue  Mestinon 30 mg 2-3 times a day.  He should call us if he feels any weaker and I would consider immunotherapy. 2.   Start using CPAP.   He will be re-arranging the bedroom again soon and start then.   He has supplies.   Tried to lose weight.  3.    He will return to see me in 12 months but call sooner if he has new or worsening neurologic symptoms.   Tayron Hunnell A. Felecia Shelling, MD, PhD 03/40/3524, 8:18 PM Certified in Neurology, Clinical Neurophysiology, Sleep Medicine, Pain Medicine and Neuroimaging  [facility] [address], [place], [place] [address] [phone]

## 2017-05-14 DIAGNOSIS — Z85828 Personal history of other malignant neoplasm of skin: Secondary | ICD-10-CM | POA: Diagnosis not present

## 2017-05-14 DIAGNOSIS — C44529 Squamous cell carcinoma of skin of other part of trunk: Secondary | ICD-10-CM | POA: Diagnosis not present

## 2017-05-14 DIAGNOSIS — D485 Neoplasm of uncertain behavior of skin: Secondary | ICD-10-CM | POA: Diagnosis not present

## 2017-05-26 DIAGNOSIS — Z23 Encounter for immunization: Secondary | ICD-10-CM | POA: Diagnosis not present

## 2017-06-15 ENCOUNTER — Encounter: Payer: Medicare Other | Admitting: *Deleted

## 2017-06-15 ENCOUNTER — Telehealth: Payer: Self-pay | Admitting: Cardiology

## 2017-06-15 NOTE — Telephone Encounter (Signed)
Spoke with pt and reminded pt of remote transmission that is due today. Pt verbalized understanding.   

## 2017-06-17 ENCOUNTER — Encounter: Payer: Self-pay | Admitting: Cardiology

## 2017-07-14 DIAGNOSIS — L821 Other seborrheic keratosis: Secondary | ICD-10-CM | POA: Diagnosis not present

## 2017-07-14 DIAGNOSIS — L820 Inflamed seborrheic keratosis: Secondary | ICD-10-CM | POA: Diagnosis not present

## 2017-07-14 DIAGNOSIS — L570 Actinic keratosis: Secondary | ICD-10-CM | POA: Diagnosis not present

## 2017-07-14 DIAGNOSIS — C44729 Squamous cell carcinoma of skin of left lower limb, including hip: Secondary | ICD-10-CM | POA: Diagnosis not present

## 2017-07-14 DIAGNOSIS — Z85828 Personal history of other malignant neoplasm of skin: Secondary | ICD-10-CM | POA: Diagnosis not present

## 2017-09-03 DIAGNOSIS — M48061 Spinal stenosis, lumbar region without neurogenic claudication: Secondary | ICD-10-CM | POA: Diagnosis not present

## 2017-09-04 ENCOUNTER — Other Ambulatory Visit: Payer: Self-pay | Admitting: Orthopedic Surgery

## 2017-09-04 DIAGNOSIS — M48061 Spinal stenosis, lumbar region without neurogenic claudication: Secondary | ICD-10-CM

## 2017-09-18 ENCOUNTER — Telehealth: Payer: Self-pay | Admitting: Internal Medicine

## 2017-09-18 NOTE — Telephone Encounter (Signed)
Spoke with pt informed him that we had not received a transmission since Dec. Pt stated that he would send a transmission over the weekend, informed pt that I would schedule him a remote appointment for Monday that we could get remote transmissions back on schedule. Pt voiced understanding

## 2017-09-18 NOTE — Telephone Encounter (Signed)
°  1. Has your device fired? no  2. Is you device beeping? no  3. Are you experiencing draining or swelling at device site? no  4. Are you calling to see if we received your device transmission? yes  5. Have you passed out? No   Patient calling to see if any of transmissions have been going through.    Please route to [place]

## 2017-09-21 ENCOUNTER — Ambulatory Visit (INDEPENDENT_AMBULATORY_CARE_PROVIDER_SITE_OTHER): Payer: Medicare Other | Admitting: *Deleted

## 2017-09-21 ENCOUNTER — Telehealth: Payer: Self-pay | Admitting: Cardiology

## 2017-09-21 DIAGNOSIS — I442 Atrioventricular block, complete: Secondary | ICD-10-CM | POA: Diagnosis not present

## 2017-09-21 NOTE — Progress Notes (Signed)
Remote pacemaker transmission.   

## 2017-09-21 NOTE — Telephone Encounter (Signed)
LMOVM reminding pt to send remote transmission.   

## 2017-09-22 ENCOUNTER — Ambulatory Visit
Admission: RE | Admit: 2017-09-22 | Discharge: 2017-09-22 | Disposition: A | Discharge status: Home / Self Care | Payer: Medicare Other | Source: Ambulatory Visit | Attending: Orthopedic Surgery | Admitting: Orthopedic Surgery

## 2017-09-22 DIAGNOSIS — M48061 Spinal stenosis, lumbar region without neurogenic claudication: Secondary | ICD-10-CM | POA: Diagnosis not present

## 2017-09-22 MED ORDER — DIAZEPAM 5 MG PO TABS
5.0000 mg | ORAL_TABLET | Freq: Once | ORAL | Status: AC
  Administered 2017-09-22: 5 mg via ORAL

## 2017-09-22 MED ORDER — IOPAMIDOL (ISOVUE-M 200) INJECTION 41%
15.0000 mL | Freq: Once | INTRAMUSCULAR | Status: AC
  Administered 2017-09-22: 15 mL via INTRATHECAL

## 2017-09-22 MED ORDER — ONDANSETRON HCL 4 MG/2ML IJ SOLN: 4.0000 mg | Freq: Four times a day (QID) | INTRAMUSCULAR | Status: DC | PRN

## 2017-09-22 NOTE — Discharge Instructions (Signed)

## 2017-09-24 ENCOUNTER — Encounter: Payer: Self-pay | Admitting: Cardiology

## 2017-09-24 ENCOUNTER — Telehealth: Payer: Self-pay | Admitting: Internal Medicine

## 2017-09-24 NOTE — Telephone Encounter (Signed)
Spoke with pt informed him that transmission was received.

## 2017-09-24 NOTE — Telephone Encounter (Signed)
NEW MESSAGE    Patient calling with questions about new monitor he received.    1. Has your device fired? NO  2. Is you device beeping? NO 3. Are you experiencing draining or swelling at device site? NO  4. Are you calling to see if we received your device transmission? YES  5. Have you passed out? NO    Please route to [place]

## 2017-09-29 DIAGNOSIS — M48061 Spinal stenosis, lumbar region without neurogenic claudication: Secondary | ICD-10-CM | POA: Diagnosis not present

## 2017-10-05 LAB — CUP PACEART REMOTE DEVICE CHECK
Battery Remaining Longevity: 122 mo
Battery Voltage: 2.78 V
Brady Statistic AP VP Percent: 3 %
Implantable Lead Implant Date: 20160329
Implantable Lead Implant Date: 20160329
Implantable Lead Location: 753859
Implantable Lead Location: 753860
Implantable Lead Model: 5092
Implantable Pulse Generator Implant Date: 20160329
Lead Channel Impedance Value: 513 Ohm
Lead Channel Pacing Threshold Amplitude: 0.625 V
Lead Channel Pacing Threshold Amplitude: 0.625 V
Lead Channel Pacing Threshold Pulse Width: 0.4 ms
Lead Channel Setting Pacing Amplitude: 2 V
Lead Channel Setting Pacing Pulse Width: 0.4 ms
Lead Channel Setting Sensing Sensitivity: 2.8 mV
MDC IDC MSMT BATTERY IMPEDANCE: 162 Ohm
MDC IDC MSMT LEADCHNL RA PACING THRESHOLD PULSEWIDTH: 0.4 ms
MDC IDC MSMT LEADCHNL RV IMPEDANCE VALUE: 628 Ohm
MDC IDC SESS DTM: 20190225191643
MDC IDC SET LEADCHNL RV PACING AMPLITUDE: 2.5 V
MDC IDC STAT BRADY AP VS PERCENT: 0 %
MDC IDC STAT BRADY AS VP PERCENT: 97 %
MDC IDC STAT BRADY AS VS PERCENT: 0 %

## 2017-10-19 DIAGNOSIS — K1120 Sialoadenitis, unspecified: Secondary | ICD-10-CM | POA: Diagnosis not present

## 2017-10-19 DIAGNOSIS — K119 Disease of salivary gland, unspecified: Secondary | ICD-10-CM | POA: Diagnosis not present

## 2017-10-23 DIAGNOSIS — K1120 Sialoadenitis, unspecified: Secondary | ICD-10-CM | POA: Diagnosis not present

## 2017-10-28 DIAGNOSIS — H9209 Otalgia, unspecified ear: Secondary | ICD-10-CM | POA: Diagnosis not present

## 2017-11-05 DIAGNOSIS — E7800 Pure hypercholesterolemia, unspecified: Secondary | ICD-10-CM | POA: Diagnosis not present

## 2017-11-05 DIAGNOSIS — E119 Type 2 diabetes mellitus without complications: Secondary | ICD-10-CM | POA: Diagnosis not present

## 2017-11-05 DIAGNOSIS — Z125 Encounter for screening for malignant neoplasm of prostate: Secondary | ICD-10-CM | POA: Diagnosis not present

## 2017-11-05 DIAGNOSIS — I10 Essential (primary) hypertension: Secondary | ICD-10-CM | POA: Diagnosis not present

## 2017-11-06 ENCOUNTER — Other Ambulatory Visit: Payer: Self-pay

## 2017-11-06 ENCOUNTER — Encounter: Payer: Self-pay | Admitting: Neurology

## 2017-11-06 ENCOUNTER — Ambulatory Visit: Payer: Medicare Other | Admitting: Neurology

## 2017-11-06 VITALS — BP 140/91 | HR 96 | Resp 16 | Ht 69.0 in | Wt 223.0 lb

## 2017-11-06 DIAGNOSIS — R51 Headache: Secondary | ICD-10-CM

## 2017-11-06 DIAGNOSIS — G7000 Myasthenia gravis without (acute) exacerbation: Secondary | ICD-10-CM | POA: Diagnosis not present

## 2017-11-06 DIAGNOSIS — G4733 Obstructive sleep apnea (adult) (pediatric): Secondary | ICD-10-CM | POA: Diagnosis not present

## 2017-11-06 DIAGNOSIS — H02403 Unspecified ptosis of bilateral eyelids: Secondary | ICD-10-CM | POA: Diagnosis not present

## 2017-11-06 DIAGNOSIS — R519 Headache, unspecified: Secondary | ICD-10-CM | POA: Insufficient documentation

## 2017-11-06 MED ORDER — SULFAMETHOXAZOLE-TRIMETHOPRIM 800-160 MG PO TABS: 1.0000 | ORAL_TABLET | Freq: Two times a day (BID) | ORAL | 0 refills | Status: DC

## 2017-11-06 NOTE — Progress Notes (Signed)
[facility]  PATIENT: David [name] DOB: 19-Jun-1936  REFERRING DOCTOR OR PCP:  [name] (PCP) and Rutherford Guys (Ophtho) SOURCE: Patient, records from Dr. Shelia Media, Lab results, CPAP download  _________________________________   HISTORICAL  CHIEF COMPLAINT:  Chief Complaint  Patient presents with  . Myasthenia Gravis    Sts. he continues to tolerate Mestinon. Sts. he took Flexeril for 5 days last wk., began having more trouble with muscles in his legs. Has stopped the Flexeril, still having trouble, wants to know if he needs to wait it out or incease Mestinon./fim    HISTORY OF PRESENT ILLNESS:  David [name]  is a 82 year-old man with ocular myasthenia and OSA.     Update 11/06/2017: He is on Mestinon for his MG and he tolerates it well.   He is on only 30 mg 2-3 and symptoms improved.     He had a possible abscess on the left near his left ear and was prescribed clindamycin with benefit.   He then had a similar issue on the right with swelling so was placed on it again, with less benefit.  Due to continued pain, he was prescribed cyclobenzaprine 10/28/17 and felt weaker in his legs.   He only took one pill a day x 5 days      He has OSA but has not been using his CPAP.  We discussed trying to use it nightly.     Update 05/12/2017:     He feels the MG is doing well.   He takes mestinon 1/2 pill bid and tolerates it well.   He denies diplopia. No proximal weakness.  He has OSA and a new CPAP machine was ordered with a different mask.    He hasn't set it up as the bedroom was moved around (wife had knee surgery).  He snores but does not wake up.   He sleeps 5-6 hours sometimes wakingup once to urinate.   He rarely dozes off unintentionally, only if tired.      _________________________________________ From 12/31/2016  MG:    His ptosis improved with Mestinon. He had trouble tolerating higher dosages but notes continued clinical benefit benefit at a  relatively low dose of 30 mg 2-3 times a day (usually takes twice a day but will take a third pill if he could be out of the house).    Higher dose caused diarrhea.    He denies any weakness in the shoulders or hips. He is able to stand up from a chair multiple times without using his arms he can stand up from a squatting position.    Vallecular mass?:   We had checked CT scan of the chest and neck to rule out thymoma.   None was found.   However, there was asymmetry with the possible soft tissue mass involving the right vallecular.      He is not a smoker or drinker or heavy drinker.    He saw Dr. Benjamine Mola of ENT and had a scope and nothing   OSA;  He has OSA ans is on CPAP.    PSG 01/07/99 showed an RDI = 99 and PSG 2004 showed RDI = 78.   He has a new machine since 2013 ([employer]).    He stopped CPAP 2 years ago because of comfort issues.     He occasionally nods off due to EDS but usually does not.      MG History:   He had the onset  of ptosis, while traveling in [place] and [place], about 3 weeks ago. He first noted the ptosis while trying to watch a movie in the bus (screen was up high) and noting that the eyelids were covering his vision.  He did note the trip was stressful and he and his wife both had injuries (he sprained ankle and wife broke shoulder).   Lab work was performed and the acetylcholine binding antibodies were elevated at 1.85 (less than 0.24 normal). 57% were blocking antibodies and 25% or modulating antibodies.   Other lab work was noncontributory.     CT scan of the head showed age-related mild chronic microvascular ischemic change.      REVIEW OF SYSTEMS: Constitutional: No fevers, chills, sweats, or change in appetite Eyes: No visual changes, double vision, eye pain Ear, nose and throat: No hearing loss, ear pain, nasal congestion, sore throat Cardiovascular: No chest pain, palpitations Respiratory: No shortness of breath at rest or with exertion.   No wheezes GastrointestinaI:  No nausea, vomiting, diarrhea, abdominal pain, fecal incontinence Genitourinary: No dysuria, urinary retention or frequency.  No nocturia. Musculoskeletal: No neck pain, back pain Integumentary: No rash, pruritus, skin lesions Neurological: as above Psychiatric: No depression at this time.  No anxiety Endocrine: No palpitations, diaphoresis, change in appetite, change in weigh or increased thirst Hematologic/Lymphatic: No anemia, purpura, petechiae. Allergic/Immunologic: No itchy/runny eyes, nasal congestion, recent allergic reactions, rashes  ALLERGIES: Allergies  Allergen Reactions  . Gris-Peg [Griseofulvin] Other (See Comments)    headaches  . Terbinafine Other (See Comments)    headache    HOME MEDICATIONS:  Current Outpatient Medications:  .  alfuzosin (UROXATRAL) 10 MG 24 hr tablet, , Disp: , Rfl:  .  aspirin EC 81 MG tablet, Take 81 mg by mouth daily. , Disp: , Rfl:  .  ezetimibe-simvastatin (VYTORIN) 10-40 MG per tablet, Take 1 tablet by mouth at bedtime., Disp: , Rfl:  .  fish oil-omega-3 fatty acids 1000 MG capsule, Take 1 g by mouth daily. , Disp: , Rfl:  .  fluticasone (FLONASE) 50 MCG/ACT nasal spray, , Disp: , Rfl:  .  glucosamine-chondroitin 500-400 MG tablet, Take 1 tablet by mouth daily. , Disp: , Rfl:  .  loratadine (CLARITIN) 10 MG tablet, Take 10 mg by mouth daily., Disp: , Rfl:  .  Meth-Hyo-M Bl-Na Phos-Ph Sal (URIBEL) 118 MG CAPS, Take 1 capsule by mouth daily as needed (urinary burning sensations). , Disp: , Rfl:  .  Multiple Vitamins-Minerals (MULTIVITAMIN WITH MINERALS) tablet, Take 1 tablet by mouth daily., Disp: , Rfl:  .  nitroGLYCERIN (NITROSTAT) 0.4 MG SL tablet, Place 1 tablet (0.4 mg total) under the tongue every 5 (five) minutes as needed for chest pain. Reported on 11/01/2015, Disp: 25 tablet, Rfl: 1 .  pyridostigmine (MESTINON) 60 MG tablet, 1/2 to 1 pill po three times a day, Disp: 90 tablet, Rfl: 11 .  tamsulosin (FLOMAX) 0.4 MG CAPS capsule,  Take 0.4 mg by mouth daily. Reported on 11/01/2015, Disp: , Rfl:  .  triamterene-hydrochlorothiazide (MAXZIDE-25) 37.5-25 MG per tablet, Take 1 tablet by mouth daily., Disp: , Rfl:  .  vitamin C (ASCORBIC ACID) 250 MG tablet, Take 250 mg by mouth daily., Disp: , Rfl:  .  sulfamethoxazole-trimethoprim (BACTRIM DS,SEPTRA DS) 800-160 MG tablet, Take 1 tablet by mouth 2 (two) times daily., Disp: 14 tablet, Rfl: 0  PAST MEDICAL HISTORY: Past Medical History:  Diagnosis Date  . CAD (coronary artery disease)    previous stent to  LAD 2003  . Cancer ([place])    skin  . Hyperlipidemia   . Hypertension   . LBBB (left bundle branch block)   . S/P cardiac pacemaker procedure, placement 10/24/14 Medtronic Adapta L model ADDRL 1  10/25/2014    PAST SURGICAL HISTORY: Past Surgical History:  Procedure Laterality Date  . CORONARY ANGIOPLASTY WITH STENT PLACEMENT  03/01/2002   stenting to LAD - Dr. Glade Lloyd  . NM MYOCAR PERF WALL MOTION  11/27/2010   normal  . PERMANENT PACEMAKER INSERTION N/A 10/24/2014   Procedure: PERMANENT PACEMAKER INSERTION;  Surgeon: [name], MD;  Location[facility] CATH LAB;  Service: Cardiovascular;  Laterality: N/A;  . TONSILLECTOMY    . US ECHOCARDIOGRAPHY  12/05/2010   proximal septal thickening,mild concentric LVH,mild deptal hypokinesis,RV mildly dilated,LA mildly dilated,mild AI,moderate aortic root dilatation,septal motion c/w conduction abnormality    FAMILY HISTORY: Family History  Problem Relation Age of Onset  . Breast cancer Mother   . Colon cancer Father   . Lung cancer Father     SOCIAL HISTORY:  Social History   Socioeconomic History  . Marital status: Married    Spouse name: Not on file  . Number of children: Not on file  . Years of education: Not on file  . Highest education level: Not on file  Occupational History  . Not on file  Social Needs  . Financial resource strain: Not on file  . Food insecurity:    Worry: Not on file    Inability: Not on file   . Transportation needs:    Medical: Not on file    Non-medical: Not on file  Tobacco Use  . Smoking status: Former Smoker    Types: Cigarettes, Cigars    Last attempt to quit: 10/27/1982    Years since quitting: 35.0  . Smokeless tobacco: Never Used  Substance and Sexual Activity  . Alcohol use: Yes    Comment: 1-2 drink per week  . Drug use: No  . Sexual activity: Not on file  Lifestyle  . Physical activity:    Days per week: Not on file    Minutes per session: Not on file  . Stress: Not on file  Relationships  . Social connections:    Talks on phone: Not on file    Gets together: Not on file    Attends religious service: Not on file    Active member of club or organization: Not on file    Attends meetings of clubs or organizations: Not on file    Relationship status: Not on file  . Intimate partner violence:    Fear of current or ex partner: Not on file    Emotionally abused: Not on file    Physically abused: Not on file    Forced sexual activity: Not on file  Other Topics Concern  . Not on file  Social History Narrative  . Not on file     PHYSICAL EXAM  Vitals:   11/06/17 1102  BP: (!) 140/91  Pulse: 96  Resp: 16  Weight: 223 lb (101.2 kg)  Height: 5\' 9"  (1.753 m)    Body mass index is 32.93 kg/m.   BP recheck 150/85 at 0856   General: The patient is well-developed and well-nourished and in no acute distress   Neurologic Exam  Mental status: The patient is alert and oriented x 3 at the time of the examination. The patient has apparent normal recent and remote memory, with an apparently normal attention  span and concentration ability.   Speech is normal.  Cranial nerves: Extraocular movements are full.  Facial strength and sensation is normal.  There is no ptosis.  Trapezius is strong.  The tongue is midline, and the patient has symmetric elevation of the soft palate. No obvious hearing deficits are noted.  Motor:  Muscle bulk is normal.   Tone is  normal. Strength is  5 / 5 in all 4 extremities except 4+/5 strength in the deltoids.  He is able to rise up from a chair 5 times without using his arms.  There was no fatiguing  Sensory: Sensory testing is intact to pinprick, soft touch and vibration sensation in all 4 extremities.  Coordination: Cerebellar testing reveals good finger-nose-finger and heel-to-shin bilaterally.  Gait and station: Station is normal.  Gait and tandem gait are normal for age.. Romberg is negative.   Reflexes: Deep tendon reflexes are symmetric and normal bilaterally.       DIAGNOSTIC DATA (LABS, IMAGING, TESTING) - I reviewed patient records, labs, notes, testing and imaging myself where available.  Lab Results  Component Value Date   WBC 6.1 04/21/2016   HGB 14.3 04/21/2016   HCT 42.0 04/21/2016   MCV 93.9 04/21/2016   PLT 228 04/21/2016      Component Value Date/Time   NA 141 01/02/2017 0920   K 3.8 01/02/2017 0920   CL 101 01/02/2017 0920   CO2 26 01/02/2017 0920   GLUCOSE 131 (H) 01/02/2017 0920   GLUCOSE 99 04/21/2016 1651   BUN 19 01/02/2017 0920   CREATININE 0.84 01/02/2017 0920   CALCIUM 9.0 01/02/2017 0920   PROT 6.3 (L) 04/21/2016 1630   ALBUMIN 3.9 04/21/2016 1630   AST 25 04/21/2016 1630   ALT 21 04/21/2016 1630   ALKPHOS 60 04/21/2016 1630   BILITOT 1.2 04/21/2016 1630   GFRNONAA 83 01/02/2017 0920   GFRAA 96 01/02/2017 0920   No results found for: CHOL, HDL, LDLCALC, LDLDIRECT, TRIG, CHOLHDL Lab Results  Component Value Date   HGBA1C 6.6 (H) 10/24/2014   No results found for: VITAMINB12 Lab Results  Component Value Date   TSH 1.402 10/24/2014       ASSESSMENT AND PLAN  Myasthenia gravis ([facility])  Ptosis of both eyelids  Facial pain  OSA (obstructive sleep apnea)    1..  Increase Mestinon to 60 mg po 2-3 times daily.  If he feels any weaker and I would consider immunotherapy with steroid followed by a steroid sparing agent.   2.   Avoid clindamycin as it may  worsen neuromuscular junction function.   Bactrim and PCNs and Cephalosporins are likely safer.   Trial of Bactrim.  Consider Keflex if not better.    I gave a list of medications that should be avoided 3.   Start using CPAP.  Also advised to try to lose weight 4.    He will return to see me in 12 months but call sooner if he has new or worsening neurologic symptoms.   David [name] A. Felecia Shelling, MD, PhD 03/25/9370, 69:67 AM Certified in Neurology, Clinical Neurophysiology, Sleep Medicine, Pain Medicine and Neuroimaging  [facility] [address], [place], [place] [address] [phone]

## 2017-11-10 DIAGNOSIS — I2510 Atherosclerotic heart disease of native coronary artery without angina pectoris: Secondary | ICD-10-CM | POA: Diagnosis not present

## 2017-11-10 DIAGNOSIS — Z0000 Encounter for general adult medical examination without abnormal findings: Secondary | ICD-10-CM | POA: Diagnosis not present

## 2017-11-10 DIAGNOSIS — E119 Type 2 diabetes mellitus without complications: Secondary | ICD-10-CM | POA: Diagnosis not present

## 2017-11-10 DIAGNOSIS — I10 Essential (primary) hypertension: Secondary | ICD-10-CM | POA: Diagnosis not present

## 2017-11-12 DIAGNOSIS — R6884 Jaw pain: Secondary | ICD-10-CM | POA: Diagnosis not present

## 2017-11-12 DIAGNOSIS — K1122 Acute recurrent sialoadenitis: Secondary | ICD-10-CM | POA: Diagnosis not present

## 2017-11-16 ENCOUNTER — Ambulatory Visit: Payer: Medicare Other | Admitting: Neurology

## 2017-11-23 ENCOUNTER — Telehealth: Payer: Self-pay | Admitting: Neurology

## 2017-11-23 MED ORDER — CEPHALEXIN 500 MG PO CAPS: 500.0000 mg | ORAL_CAPSULE | Freq: Two times a day (BID) | ORAL | 0 refills | Status: DC

## 2017-11-23 NOTE — Telephone Encounter (Signed)
Pt requesting a refill for sulfamethoxazole-trimethoprim sent to [facility] stating he started feeling better on this medication but felt he needed another's week worth. Pt states once finished the antibiotics the symptoms returned please call to advise

## 2017-11-23 NOTE — Telephone Encounter (Signed)
Spoke with David [name] and per RAS, offered Keflex 500mg  po bid for 2 wks.  He is agreeable.  Rx. escribed to [facility]/fim

## 2017-11-24 DIAGNOSIS — G7000 Myasthenia gravis without (acute) exacerbation: Secondary | ICD-10-CM | POA: Diagnosis not present

## 2017-11-24 DIAGNOSIS — R238 Other skin changes: Secondary | ICD-10-CM | POA: Diagnosis not present

## 2017-11-25 DIAGNOSIS — Z85828 Personal history of other malignant neoplasm of skin: Secondary | ICD-10-CM | POA: Diagnosis not present

## 2017-11-25 DIAGNOSIS — L570 Actinic keratosis: Secondary | ICD-10-CM | POA: Diagnosis not present

## 2017-11-25 DIAGNOSIS — D37030 Neoplasm of uncertain behavior of the parotid salivary glands: Secondary | ICD-10-CM | POA: Diagnosis not present

## 2017-11-25 DIAGNOSIS — H9209 Otalgia, unspecified ear: Secondary | ICD-10-CM | POA: Diagnosis not present

## 2017-11-27 ENCOUNTER — Ambulatory Visit: Payer: Medicare Other | Admitting: Podiatry

## 2017-12-02 ENCOUNTER — Other Ambulatory Visit: Payer: Self-pay

## 2017-12-03 ENCOUNTER — Ambulatory Visit: Payer: Medicare Other | Admitting: Podiatry

## 2017-12-03 ENCOUNTER — Encounter: Payer: Self-pay | Admitting: Podiatry

## 2017-12-03 DIAGNOSIS — M79609 Pain in unspecified limb: Secondary | ICD-10-CM | POA: Diagnosis not present

## 2017-12-03 DIAGNOSIS — B351 Tinea unguium: Secondary | ICD-10-CM | POA: Diagnosis not present

## 2017-12-22 ENCOUNTER — Ambulatory Visit (INDEPENDENT_AMBULATORY_CARE_PROVIDER_SITE_OTHER): Payer: Medicare Other | Admitting: *Deleted

## 2017-12-22 ENCOUNTER — Telehealth: Payer: Self-pay

## 2017-12-22 DIAGNOSIS — I442 Atrioventricular block, complete: Secondary | ICD-10-CM

## 2017-12-22 NOTE — Telephone Encounter (Signed)
Spoke with pt and reminded pt of remote transmission that is due today. Pt verbalized understanding.   

## 2017-12-23 NOTE — Progress Notes (Signed)
Remote pacemaker transmission.   

## 2017-12-24 LAB — CUP PACEART REMOTE DEVICE CHECK
Battery Impedance: 187 Ohm
Battery Remaining Longevity: 117 mo
Battery Voltage: 2.78 V
Brady Statistic AP VP Percent: 3 %
Brady Statistic AP VS Percent: 0 %
Implantable Lead Implant Date: 20160329
Implantable Lead Location: 753859
Implantable Lead Location: 753860
Implantable Lead Model: 5076
Implantable Lead Model: 5092
Implantable Pulse Generator Implant Date: 20160329
Lead Channel Impedance Value: 498 Ohm
Lead Channel Pacing Threshold Amplitude: 0.875 V
Lead Channel Pacing Threshold Pulse Width: 0.4 ms
Lead Channel Pacing Threshold Pulse Width: 0.4 ms
Lead Channel Setting Pacing Amplitude: 2 V
Lead Channel Setting Pacing Amplitude: 2.5 V
Lead Channel Setting Pacing Pulse Width: 0.4 ms
Lead Channel Setting Sensing Sensitivity: 2.8 mV
MDC IDC LEAD IMPLANT DT: 20160329
MDC IDC MSMT LEADCHNL RV IMPEDANCE VALUE: 597 Ohm
MDC IDC MSMT LEADCHNL RV PACING THRESHOLD AMPLITUDE: 0.75 V
MDC IDC SESS DTM: 20190529115822
MDC IDC STAT BRADY AS VP PERCENT: 97 %
MDC IDC STAT BRADY AS VS PERCENT: 0 %

## 2017-12-25 ENCOUNTER — Encounter: Payer: Self-pay | Admitting: Cardiology

## 2018-01-01 ENCOUNTER — Ambulatory Visit: Payer: Medicare Other | Admitting: Neurology

## 2018-01-09 DIAGNOSIS — M48061 Spinal stenosis, lumbar region without neurogenic claudication: Secondary | ICD-10-CM | POA: Diagnosis not present

## 2018-01-12 ENCOUNTER — Telehealth: Payer: Self-pay | Admitting: Neurology

## 2018-01-12 DIAGNOSIS — D485 Neoplasm of uncertain behavior of skin: Secondary | ICD-10-CM | POA: Diagnosis not present

## 2018-01-12 DIAGNOSIS — L570 Actinic keratosis: Secondary | ICD-10-CM | POA: Diagnosis not present

## 2018-01-12 DIAGNOSIS — L821 Other seborrheic keratosis: Secondary | ICD-10-CM | POA: Diagnosis not present

## 2018-01-12 DIAGNOSIS — D045 Carcinoma in situ of skin of trunk: Secondary | ICD-10-CM | POA: Diagnosis not present

## 2018-01-12 DIAGNOSIS — C44329 Squamous cell carcinoma of skin of other parts of face: Secondary | ICD-10-CM | POA: Diagnosis not present

## 2018-01-12 DIAGNOSIS — Z85828 Personal history of other malignant neoplasm of skin: Secondary | ICD-10-CM | POA: Diagnosis not present

## 2018-01-12 MED ORDER — PYRIDOSTIGMINE BROMIDE 60 MG PO TABS: 60.0000 mg | ORAL_TABLET | Freq: Three times a day (TID) | ORAL | 3 refills | Status: DC

## 2018-01-12 NOTE — Telephone Encounter (Signed)
Spoke with pt. and per RAS, advised ok for Prednisone taper pk. Pt. also sts. he is up to Mestinon 60mg  tid, but b/c instruction on rx. still say 1/2 to 1 tab tid, ins. will only allow for 60 tabs per month. I am not sure why this is, but I resent rx. as requested/fim

## 2018-01-12 NOTE — Telephone Encounter (Signed)
Pt called his Dr Maryan Char is wanting to start the pt on low dose of prednisone packet for left hamstring. He is concerned it will have an interaction with pyridostigmine (MESTINON) 60 MG tablet. Please call to advise

## 2018-01-20 ENCOUNTER — Ambulatory Visit: Payer: Medicare Other | Admitting: Internal Medicine

## 2018-01-20 ENCOUNTER — Encounter: Payer: Self-pay | Admitting: Internal Medicine

## 2018-01-20 VITALS — BP 126/68 | HR 82 | Ht 69.0 in | Wt 221.0 lb

## 2018-01-20 DIAGNOSIS — I442 Atrioventricular block, complete: Secondary | ICD-10-CM

## 2018-01-20 DIAGNOSIS — Z950 Presence of cardiac pacemaker: Secondary | ICD-10-CM | POA: Diagnosis not present

## 2018-01-20 DIAGNOSIS — I10 Essential (primary) hypertension: Secondary | ICD-10-CM

## 2018-01-20 DIAGNOSIS — I472 Ventricular tachycardia: Secondary | ICD-10-CM | POA: Diagnosis not present

## 2018-01-20 DIAGNOSIS — I2510 Atherosclerotic heart disease of native coronary artery without angina pectoris: Secondary | ICD-10-CM | POA: Diagnosis not present

## 2018-01-20 DIAGNOSIS — I4729 Other ventricular tachycardia: Secondary | ICD-10-CM

## 2018-01-20 LAB — CUP PACEART INCLINIC DEVICE CHECK
Battery Impedance: 211 Ohm
Battery Remaining Longevity: 115 mo
Brady Statistic AP VP Percent: 3 %
Brady Statistic AS VP Percent: 97 %
Date Time Interrogation Session: 20190626095457
Implantable Lead Implant Date: 20160329
Implantable Lead Implant Date: 20160329
Implantable Lead Location: 753859
Implantable Lead Location: 753860
Implantable Lead Model: 5076
Implantable Lead Model: 5092
Implantable Pulse Generator Implant Date: 20160329
Lead Channel Pacing Threshold Amplitude: 0.5 V
Lead Channel Pacing Threshold Pulse Width: 0.4 ms
Lead Channel Sensing Intrinsic Amplitude: 4 mV
Lead Channel Setting Pacing Amplitude: 2.5 V
Lead Channel Setting Pacing Pulse Width: 0.4 ms
MDC IDC MSMT BATTERY VOLTAGE: 2.79 V
MDC IDC MSMT LEADCHNL RA IMPEDANCE VALUE: 539 Ohm
MDC IDC MSMT LEADCHNL RV IMPEDANCE VALUE: 657 Ohm
MDC IDC MSMT LEADCHNL RV PACING THRESHOLD AMPLITUDE: 1 V
MDC IDC MSMT LEADCHNL RV PACING THRESHOLD PULSEWIDTH: 0.4 ms
MDC IDC MSMT LEADCHNL RV SENSING INTR AMPL: 11.2 mV
MDC IDC SET LEADCHNL RA PACING AMPLITUDE: 2 V
MDC IDC SET LEADCHNL RV SENSING SENSITIVITY: 2.8 mV
MDC IDC STAT BRADY AP VS PERCENT: 0 %
MDC IDC STAT BRADY AS VS PERCENT: 0 %

## 2018-01-20 NOTE — Progress Notes (Signed)
PCP: [name], MD Primary Cardiologist: Dr Claiborne Billings Primary EP:  Dr Rayann Heman  David [name] is a 82 y.o. male who presents today for routine electrophysiology followup.  Since last being seen in our clinic, the patient reports doing very well.  Today, he denies symptoms of palpitations, chest pain, shortness of breath,  lower extremity edema, dizziness, presyncope, or syncope.  He has myasthenia gravis.  The patient is otherwise without complaint today.   Past Medical History:  Diagnosis Date  . CAD (coronary artery disease)    previous stent to LAD 2003  . Cancer ([place])    skin  . Hyperlipidemia   . Hypertension   . LBBB (left bundle branch block)   . S/P cardiac pacemaker procedure, placement 10/24/14 Medtronic Adapta L model ADDRL 1  10/25/2014   Past Surgical History:  Procedure Laterality Date  . CORONARY ANGIOPLASTY WITH STENT PLACEMENT  03/01/2002   stenting to LAD - Dr. Glade Lloyd  . NM MYOCAR PERF WALL MOTION  11/27/2010   normal  . PERMANENT PACEMAKER INSERTION N/A 10/24/2014   Procedure: PERMANENT PACEMAKER INSERTION;  Surgeon: [name], MD;  Location: [facility] CATH LAB;  Service: Cardiovascular;  Laterality: N/A;  . TONSILLECTOMY    . US ECHOCARDIOGRAPHY  12/05/2010   proximal septal thickening,mild concentric LVH,mild deptal hypokinesis,RV mildly dilated,LA mildly dilated,mild AI,moderate aortic root dilatation,septal motion c/w conduction abnormality    ROS- all systems are reviewed and negative except as per HPI above  Current Outpatient Medications  Medication Sig Dispense Refill  . alfuzosin (UROXATRAL) 10 MG 24 hr tablet     . aspirin EC 81 MG tablet Take 81 mg by mouth daily.     . cyclobenzaprine (FLEXERIL) 10 MG tablet TK 1 T PO QHS PRN P.  0  . ezetimibe-simvastatin (VYTORIN) 10-40 MG per tablet Take 1 tablet by mouth at bedtime.    . fish oil-omega-3 fatty acids 1000 MG capsule Take 1 g by mouth daily.     . fluticasone (FLONASE) 50 MCG/ACT nasal spray       . glucosamine-chondroitin 500-400 MG tablet Take 1 tablet by mouth daily.     Marland Kitchen loratadine (CLARITIN) 10 MG tablet Take 10 mg by mouth daily.    . Meth-Hyo-M Bl-Na Phos-Ph Sal (URIBEL) 118 MG CAPS Take 1 capsule by mouth daily as needed (urinary burning sensations).     . Multiple Vitamins-Minerals (MULTIVITAMIN WITH MINERALS) tablet Take 1 tablet by mouth daily.    . nitroGLYCERIN (NITROSTAT) 0.4 MG SL tablet Place 1 tablet (0.4 mg total) under the tongue every 5 (five) minutes as needed for chest pain. Reported on 11/01/2015 25 tablet 1  . predniSONE (STERAPRED UNI-PAK 48 TAB) 10 MG (48) TBPK tablet Take by mouth as directed.    . pyridostigmine (MESTINON) 60 MG tablet Take 1 tablet (60 mg total) by mouth 3 (three) times daily. 1/2 to 1 pill po three times a day 270 tablet 3  . tamsulosin (FLOMAX) 0.4 MG CAPS capsule Take 0.4 mg by mouth daily. Reported on 11/01/2015    . triamterene-hydrochlorothiazide (MAXZIDE-25) 37.5-25 MG per tablet Take 1 tablet by mouth daily.    . vitamin C (ASCORBIC ACID) 250 MG tablet Take 250 mg by mouth daily.     No current facility-administered medications for this visit.     Physical Exam: Vitals:   01/20/18 0857  BP: 126/68  Pulse: 82  Weight: 221 lb (100.2 kg)  Height: 5\' 9"  (1.753 m)  GEN- The patient is well appearing, alert and oriented x 3 today.   Head- normocephalic, atraumatic Eyes-  Sclera clear, conjunctiva pink Ears- hearing intact Oropharynx- clear Lungs- Clear to ausculation bilaterally, normal work of breathing Chest- pacemaker pocket is well healed Heart- Regular rate and rhythm, no murmurs, rubs or gallops, PMI not laterally displaced GI- soft, NT, ND, + BS Extremities- no clubbing, cyanosis, or edema  Pacemaker interrogation- reviewed in detail today,  See PACEART report  ekg tracing ordered today is personally reviewed and shows sinus with V pacing  Assessment and Plan:  1. Symptomatic complete heart block Normal pacemaker  function His AV intrinsic conduction is 180 msec today I have therefore turned search AV on. See Claudia Desanctis Art report   2. HTN Stable No change required today  3. CAD No ischemic symptoms No changes  [facility] Return to see EP PA every year  [name] MD, [facility] 01/20/2018 9:44 AM

## 2018-01-20 NOTE — Patient Instructions (Addendum)
Medication Instructions:  Your physician recommends that you continue on your current medications as directed. Please refer to the Current Medication list given to you today.  Labwork: None ordered.  Testing/Procedures: None ordered.  Follow-Up: Your physician wants you to follow-up in: one year with Tommye Standard, PA.   You will receive a reminder letter in the mail two months in advance. If you don't receive a letter, please call our office to schedule the follow-up appointment.  Remote monitoring is used to monitor your Pacemaker from home. This monitoring reduces the number of office visits required to check your device to one time per year. It allows [place] to keep an eye on the functioning of your device to ensure it is working properly. You are scheduled for a device check from home on 03/23/2018. You may send your transmission at any time that day. If you have a wireless device, the transmission will be sent automatically. After your physician reviews your transmission, you will receive a postcard with your next transmission date.  Any Other Special Instructions Will Be Listed Below (If Applicable).  If you need a refill on your cardiac medications before your next appointment, please call your pharmacy.

## 2018-01-25 DIAGNOSIS — H5213 Myopia, bilateral: Secondary | ICD-10-CM | POA: Diagnosis not present

## 2018-02-08 ENCOUNTER — Telehealth: Payer: Self-pay | Admitting: Neurology

## 2018-02-08 DIAGNOSIS — E119 Type 2 diabetes mellitus without complications: Secondary | ICD-10-CM | POA: Diagnosis not present

## 2018-02-08 DIAGNOSIS — G7000 Myasthenia gravis without (acute) exacerbation: Secondary | ICD-10-CM | POA: Diagnosis not present

## 2018-02-08 DIAGNOSIS — R29898 Other symptoms and signs involving the musculoskeletal system: Secondary | ICD-10-CM | POA: Diagnosis not present

## 2018-02-08 DIAGNOSIS — Z79899 Other long term (current) drug therapy: Secondary | ICD-10-CM | POA: Diagnosis not present

## 2018-02-08 DIAGNOSIS — H1033 Unspecified acute conjunctivitis, bilateral: Secondary | ICD-10-CM | POA: Diagnosis not present

## 2018-02-08 NOTE — Telephone Encounter (Signed)
Patient went to his PCP today because of inflammation under his eyelid. He was prescribed Polymyxin B-trimethoprin 10,000-0.1 units ml. and wants to know if he can use this since he has myasthenia gravis. Please call and advise.

## 2018-02-08 NOTE — Telephone Encounter (Signed)
Spoke with pt. and per RAS, advised ok for eyedrops. He verbalized understanding of same/fim

## 2018-02-09 DIAGNOSIS — I2510 Atherosclerotic heart disease of native coronary artery without angina pectoris: Secondary | ICD-10-CM | POA: Diagnosis not present

## 2018-02-09 DIAGNOSIS — R7303 Prediabetes: Secondary | ICD-10-CM | POA: Diagnosis not present

## 2018-02-09 DIAGNOSIS — G7000 Myasthenia gravis without (acute) exacerbation: Secondary | ICD-10-CM | POA: Diagnosis not present

## 2018-02-09 DIAGNOSIS — G4733 Obstructive sleep apnea (adult) (pediatric): Secondary | ICD-10-CM | POA: Diagnosis not present

## 2018-02-11 DIAGNOSIS — I2510 Atherosclerotic heart disease of native coronary artery without angina pectoris: Secondary | ICD-10-CM | POA: Diagnosis not present

## 2018-02-11 DIAGNOSIS — I10 Essential (primary) hypertension: Secondary | ICD-10-CM | POA: Diagnosis not present

## 2018-02-11 DIAGNOSIS — E119 Type 2 diabetes mellitus without complications: Secondary | ICD-10-CM | POA: Diagnosis not present

## 2018-02-12 DIAGNOSIS — H10503 Unspecified blepharoconjunctivitis, bilateral: Secondary | ICD-10-CM | POA: Diagnosis not present

## 2018-02-14 NOTE — Progress Notes (Signed)
Subjective:  Patient ID: David [name], male    DOB: 1936-07-13,  MRN: [id number]  Chief Complaint  Patient presents with  . Nail Problem    thick ingrown bilateral great toes   82 y.o. male presents with the above complaint.  Reports painfully thickened toenails to both great toes.  Especially left great toenail.  Denies diabetes. Past Medical History:  Diagnosis Date  . CAD (coronary artery disease)    previous stent to LAD 2003  . Cancer ([place])    skin  . Hyperlipidemia   . Hypertension   . LBBB (left bundle branch block)   . Myasthenia gravis ([place])   . S/P cardiac pacemaker procedure, placement 10/24/14 Medtronic Adapta L model ADDRL 1  10/25/2014   Past Surgical History:  Procedure Laterality Date  . CORONARY ANGIOPLASTY WITH STENT PLACEMENT  03/01/2002   stenting to LAD - Dr. Glade Lloyd  . NM MYOCAR PERF WALL MOTION  11/27/2010   normal  . PERMANENT PACEMAKER INSERTION N/A 10/24/2014   Procedure: PERMANENT PACEMAKER INSERTION;  Surgeon: [name], MD;  Location: [facility] CATH LAB;  Service: Cardiovascular;  Laterality: N/A;  . TONSILLECTOMY    . US ECHOCARDIOGRAPHY  12/05/2010   proximal septal thickening,mild concentric LVH,mild deptal hypokinesis,RV mildly dilated,LA mildly dilated,mild AI,moderate aortic root dilatation,septal motion c/w conduction abnormality    Current Outpatient Medications:  .  alfuzosin (UROXATRAL) 10 MG 24 hr tablet, , Disp: , Rfl:  .  aspirin EC 81 MG tablet, Take 81 mg by mouth daily. , Disp: , Rfl:  .  cyclobenzaprine (FLEXERIL) 10 MG tablet, TK 1 T PO QHS PRN P., Disp: , Rfl: 0 .  ezetimibe-simvastatin (VYTORIN) 10-40 MG per tablet, Take 1 tablet by mouth at bedtime., Disp: , Rfl:  .  fish oil-omega-3 fatty acids 1000 MG capsule, Take 1 g by mouth daily. , Disp: , Rfl:  .  fluticasone (FLONASE) 50 MCG/ACT nasal spray, , Disp: , Rfl:  .  glucosamine-chondroitin 500-400 MG tablet, Take 1 tablet by mouth daily. , Disp: , Rfl:  .  loratadine (CLARITIN)  10 MG tablet, Take 10 mg by mouth daily., Disp: , Rfl:  .  Meth-Hyo-M Bl-Na Phos-Ph Sal (URIBEL) 118 MG CAPS, Take 1 capsule by mouth daily as needed (urinary burning sensations). , Disp: , Rfl:  .  Multiple Vitamins-Minerals (MULTIVITAMIN WITH MINERALS) tablet, Take 1 tablet by mouth daily., Disp: , Rfl:  .  nitroGLYCERIN (NITROSTAT) 0.4 MG SL tablet, Place 1 tablet (0.4 mg total) under the tongue every 5 (five) minutes as needed for chest pain. Reported on 11/01/2015, Disp: 25 tablet, Rfl: 1 .  predniSONE (STERAPRED UNI-PAK 48 TAB) 10 MG (48) TBPK tablet, Take by mouth as directed., Disp: , Rfl:  .  pyridostigmine (MESTINON) 60 MG tablet, Take 1 tablet (60 mg total) by mouth 3 (three) times daily. 1/2 to 1 pill po three times a day, Disp: 270 tablet, Rfl: 3 .  tamsulosin (FLOMAX) 0.4 MG CAPS capsule, Take 0.4 mg by mouth daily. Reported on 11/01/2015, Disp: , Rfl:  .  triamterene-hydrochlorothiazide (MAXZIDE-25) 37.5-25 MG per tablet, Take 1 tablet by mouth daily., Disp: , Rfl:  .  vitamin C (ASCORBIC ACID) 250 MG tablet, Take 250 mg by mouth daily., Disp: , Rfl:   Allergies  Allergen Reactions  . Gris-Peg [Griseofulvin] Other (See Comments)    headaches  . Terbinafine Other (See Comments)    headache   Review of Systems: Negative except as noted in the HPI. Denies  N/V/F/Ch. Objective:  There were no vitals filed for this visit. General AA&O x3. Normal mood and affect.  Vascular Dorsalis pedis and posterior tibial pulses  present 2+ bilaterally  Capillary refill normal to all digits. Pedal hair growth normal.  Neurologic Epicritic sensation grossly present.  Dermatologic No open lesions. Interspaces clear of maceration. Nails elongated thickened dystrophic  Orthopedic: MMT 5/5 in dorsiflexion, plantarflexion, inversion, and eversion. Normal joint ROM without pain or crepitus.   Assessment & Plan:  Patient was evaluated and treated and all questions answered.  Onychomycosis with  pain -Nails debrided x10.   Procedure: Nail Debridement Rationale: Patient meets criteria for routine foot care due to pain Type of Debridement: manual, sharp debridement. Instrumentation: Nail nipper, rotary burr. Number of Nails: 10     Return if symptoms worsen or fail to improve.

## 2018-02-22 ENCOUNTER — Telehealth: Payer: Self-pay | Admitting: Neurology

## 2018-02-22 NOTE — Telephone Encounter (Signed)
I had a new spot open up tomorrow, 02/23/18, at 2:00p. I called and offered this time to patient and he was very appreciative. I confirmed address, appt time, and check in time with patient's wife.

## 2018-02-22 NOTE — Telephone Encounter (Signed)
Patient states for the last 2 weeks he cannot see well enough to drive out of his right eye. He went to his eye doctor for a swollen eyelid recently but this has nothing to do with his new problem.  For the last week he has been having neck pain and pain in both legs. He says pyridostigmine (MESTINON) 60 MG tablet is not helping and would like a soon appointment with Dr. Felecia Shelling. He has myasthenia gravis.

## 2018-02-22 NOTE — Telephone Encounter (Signed)
I spoke with patient and offered him next available appt on 02/24/18 at 3:30p. Patient voiced appreciation but said that if something becomes available even sooner to please call him. I advised patient that I would keep an eye on the schedule and call him if anything changes. Patient also advised to go to ED if symptoms change or worsen.

## 2018-02-23 ENCOUNTER — Encounter: Payer: Self-pay | Admitting: Neurology

## 2018-02-23 ENCOUNTER — Ambulatory Visit: Payer: Medicare Other | Admitting: Neurology

## 2018-02-23 VITALS — BP 116/63 | HR 99 | Wt 216.5 lb

## 2018-02-23 DIAGNOSIS — G4733 Obstructive sleep apnea (adult) (pediatric): Secondary | ICD-10-CM

## 2018-02-23 DIAGNOSIS — M5417 Radiculopathy, lumbosacral region: Secondary | ICD-10-CM

## 2018-02-23 DIAGNOSIS — G7000 Myasthenia gravis without (acute) exacerbation: Secondary | ICD-10-CM

## 2018-02-23 DIAGNOSIS — M48061 Spinal stenosis, lumbar region without neurogenic claudication: Secondary | ICD-10-CM

## 2018-02-23 DIAGNOSIS — H02403 Unspecified ptosis of bilateral eyelids: Secondary | ICD-10-CM | POA: Diagnosis not present

## 2018-02-23 NOTE — Progress Notes (Signed)
[facility]  PATIENT: David [name] DOB: [birth year]  REFERRING DOCTOR OR PCP:  [name] (PCP) and Rutherford Guys (Ophtho) SOURCE: Patient, records from Dr. Shelia Media, Lab results, CPAP download  _________________________________   HISTORICAL  CHIEF COMPLAINT:  Chief Complaint  Patient presents with  . Follow-up    RM 12 with wife. Last seen 10/2017. Having neck pain and pain in legs bilaterally.     HISTORY OF PRESENT ILLNESS:  David [name]  is a 82 year-old man with ocular myasthenia and OSA.     Update 02/23/2018: He reports having issues with redness in his eyes, R > L, and was prescribed eye drops.  While this was happening, he had more eyelid drooping.   He had more drooping after the drops than before the drops.   He saw his ophthalmologist (Dr. Gershon Crane) and then was placed on Tobramycin drops.   The infection has seemd cleared up the last 24 hours.    He is on pyridostigmine 60 mg po tid.   He toleratre  He reports pian in the left > right hamstrings and into the calf.   Sometimes there is just calf pain.   I took another look at his lumbar spine CT myelogram from earlier this year.  It does show multilevel degenerative changes with spinal stenosis at L3-L4 and significant anterolisthesis associated with pars defects at L5-S1 causing severe bilateral neuroforaminal stenosis at that level.  Either of these findings could be related to his symptoms.  He is not interested in surgery at this time and we discussed some other options.  IMPRESSION: 1. Diffuse lumbar disc and facet degeneration. 2. Multifactorial spinal stenosis at L3-4, severe with standing. Moderate bilateral neural foraminal stenosis. 3. L5 pars defects with grade 1/2 anterolisthesis which slightly increases with standing. Severe bilateral neural foraminal stenosis. 4. Mild-to-moderate left lateral recess stenosis at L2-3.   Update 11/06/2017: He is on Mestinon for his MG and he  tolerates it well.   He is on only 30 mg 2-3 and symptoms improved.     He had a possible abscess on the left near his left ear and was prescribed clindamycin with benefit.   He then had a similar issue on the right with swelling so was placed on it again, with less benefit.  Due to continued pain, he was prescribed cyclobenzaprine 10/28/17 and felt weaker in his legs.   He only took one pill a day x 5 days      He has OSA but has not been using his CPAP.  We discussed trying to use it nightly.     Update 05/12/2017:     He feels the MG is doing well.   He takes mestinon 1/2 pill bid and tolerates it well.   He denies diplopia. No proximal weakness.  He has OSA and a new CPAP machine was ordered with a different mask.    He hasn't set it up as the bedroom was moved around (wife had knee surgery).  He snores but does not wake up.   He sleeps 5-6 hours sometimes wakingup once to urinate.   He rarely dozes off unintentionally, only if tired.      _________________________________________ From 12/31/2016  MG:    His ptosis improved with Mestinon. He had trouble tolerating higher dosages but notes continued clinical benefit benefit at a relatively low dose of 30 mg 2-3 times a day (usually takes twice a day but will take a third pill if  he could be out of the house).    Higher dose caused diarrhea.    He denies any weakness in the shoulders or hips. He is able to stand up from a chair multiple times without using his arms he can stand up from a squatting position.    Vallecular mass?:   We had checked CT scan of the chest and neck to rule out thymoma.   None was found.   However, there was asymmetry with the possible soft tissue mass involving the right vallecular.      He is not a smoker or drinker or heavy drinker.    He saw Dr. Benjamine Mola of ENT and had a scope and nothing   OSA;  He has OSA ans is on CPAP.    PSG 01/07/99 showed an RDI = 99 and PSG 2004 showed RDI = 78.   He has a new machine since 2013  ([employer]med).    He stopped CPAP 2 years ago because of comfort issues.     He occasionally nods off due to EDS but usually does not.      MG History:   He had the onset of ptosis, while traveling in [place] and [place], about 3 weeks ago. He first noted the ptosis while trying to watch a movie in the bus (screen was up high) and noting that the eyelids were covering his vision.  He did note the trip was stressful and he and his wife both had injuries (he sprained ankle and wife broke shoulder).   Lab work was performed and the acetylcholine binding antibodies were elevated at 1.85 (less than 0.24 normal). 57% were blocking antibodies and 25% or modulating antibodies.   Other lab work was noncontributory.     CT scan of the head showed age-related mild chronic microvascular ischemic change.      REVIEW OF SYSTEMS: Constitutional: No fevers, chills, sweats, or change in appetite Eyes: No visual changes, double vision, eye pain Ear, nose and throat: No hearing loss, ear pain, nasal congestion, sore throat Cardiovascular: No chest pain, palpitations Respiratory: No shortness of breath at rest or with exertion.   No wheezes GastrointestinaI: No nausea, vomiting, diarrhea, abdominal pain, fecal incontinence Genitourinary: No dysuria, urinary retention or frequency.  No nocturia. Musculoskeletal: No neck pain, back pain Integumentary: No rash, pruritus, skin lesions Neurological: as above Psychiatric: No depression at this time.  No anxiety Endocrine: No palpitations, diaphoresis, change in appetite, change in weigh or increased thirst Hematologic/Lymphatic: No anemia, purpura, petechiae. Allergic/Immunologic: No itchy/runny eyes, nasal congestion, recent allergic reactions, rashes  ALLERGIES: Allergies  Allergen Reactions  . Gris-Peg [Griseofulvin] Other (See Comments)    headaches  . Terbinafine Other (See Comments)    headache    HOME MEDICATIONS:  Current Outpatient Medications:  .   alfuzosin (UROXATRAL) 10 MG 24 hr tablet, , Disp: , Rfl:  .  aspirin EC 81 MG tablet, Take 81 mg by mouth daily. , Disp: , Rfl:  .  cyclobenzaprine (FLEXERIL) 10 MG tablet, TK 1 T PO QHS PRN P., Disp: , Rfl: 0 .  ezetimibe-simvastatin (VYTORIN) 10-40 MG per tablet, Take 1 tablet by mouth at bedtime., Disp: , Rfl:  .  fish oil-omega-3 fatty acids 1000 MG capsule, Take 1 g by mouth daily. , Disp: , Rfl:  .  glucosamine-chondroitin 500-400 MG tablet, Take 1 tablet by mouth daily. , Disp: , Rfl:  .  loratadine (CLARITIN) 10 MG tablet, Take 10 mg by mouth daily., Disp: ,  Rfl:  .  Multiple Vitamins-Minerals (MULTIVITAMIN WITH MINERALS) tablet, Take 1 tablet by mouth daily., Disp: , Rfl:  .  nitroGLYCERIN (NITROSTAT) 0.4 MG SL tablet, Place 1 tablet (0.4 mg total) under the tongue every 5 (five) minutes as needed for chest pain. Reported on 11/01/2015, Disp: 25 tablet, Rfl: 1 .  pyridostigmine (MESTINON) 60 MG tablet, Take 1 tablet (60 mg total) by mouth 3 (three) times daily. 1/2 to 1 pill po three times a day, Disp: 270 tablet, Rfl: 3 .  tamsulosin (FLOMAX) 0.4 MG CAPS capsule, Take 0.4 mg by mouth daily. Reported on 11/01/2015, Disp: , Rfl:  .  tobramycin-dexamethasone (TOBRADEX) ophthalmic solution, Place 1 drop into both eyes 4 (four) times daily. , Disp: , Rfl:  .  triamterene-hydrochlorothiazide (MAXZIDE-25) 37.5-25 MG per tablet, Take 1 tablet by mouth daily., Disp: , Rfl:  .  vitamin C (ASCORBIC ACID) 250 MG tablet, Take 250 mg by mouth daily., Disp: , Rfl:   PAST MEDICAL HISTORY: Past Medical History:  Diagnosis Date  . CAD (coronary artery disease)    previous stent to LAD 2003  . Cancer ([place])    skin  . Hyperlipidemia   . Hypertension   . LBBB (left bundle branch block)   . Myasthenia gravis ([place])   . S/P cardiac pacemaker procedure, placement 10/24/14 Medtronic Adapta L model ADDRL 1  10/25/2014    PAST SURGICAL HISTORY: Past Surgical History:  Procedure Laterality Date  . CORONARY  ANGIOPLASTY WITH STENT PLACEMENT  03/01/2002   stenting to LAD - Dr. Glade Lloyd  . NM MYOCAR PERF WALL MOTION  11/27/2010   normal  . PERMANENT PACEMAKER INSERTION N/A 10/24/2014   Procedure: PERMANENT PACEMAKER INSERTION;  Surgeon: [name], MD;  Location: [facility] CATH LAB;  Service: Cardiovascular;  Laterality: N/A;  . TONSILLECTOMY    . US ECHOCARDIOGRAPHY  12/05/2010   proximal septal thickening,mild concentric LVH,mild deptal hypokinesis,RV mildly dilated,LA mildly dilated,mild AI,moderate aortic root dilatation,septal motion c/w conduction abnormality    FAMILY HISTORY: Family History  Problem Relation Age of Onset  . Breast cancer Mother   . Colon cancer Father   . Lung cancer Father     SOCIAL HISTORY:  Social History   Socioeconomic History  . Marital status: Married    Spouse name: Not on file  . Number of children: Not on file  . Years of education: Not on file  . Highest education level: Not on file  Occupational History  . Not on file  Social Needs  . Financial resource strain: Not on file  . Food insecurity:    Worry: Not on file    Inability: Not on file  . Transportation needs:    Medical: Not on file    Non-medical: Not on file  Tobacco Use  . Smoking status: Former Smoker    Types: Cigarettes, Cigars    Last attempt to quit: 10/27/1982    Years since quitting: 35.3  . Smokeless tobacco: Never Used  Substance and Sexual Activity  . Alcohol use: Yes    Comment: 1-2 drink per week  . Drug use: No  . Sexual activity: Not on file  Lifestyle  . Physical activity:    Days per week: Not on file    Minutes per session: Not on file  . Stress: Not on file  Relationships  . Social connections:    Talks on phone: Not on file    Gets together: Not on file    Attends religious service:  Not on file    Active member of club or organization: Not on file    Attends meetings of clubs or organizations: Not on file    Relationship status: Not on file  . Intimate  partner violence:    Fear of current or ex partner: Not on file    Emotionally abused: Not on file    Physically abused: Not on file    Forced sexual activity: Not on file  Other Topics Concern  . Not on file  Social History Narrative  . Not on file     PHYSICAL EXAM  Vitals:   02/23/18 1413  BP: 116/63  Pulse: 99  Weight: 216 lb 8 oz (98.2 kg)    Body mass index is 31.97 kg/m.   BP recheck 150/85 at 0856   General: The patient is well-developed and well-nourished and in no acute distress   Neurologic Exam  Mental status: The patient is alert and oriented x 3 at the time of the examination. The patient has apparent normal recent and remote memory, with an apparently normal attention span and concentration ability.   Speech is normal.  Cranial nerves: Extraocular movements show slight reduced upgaze on the right after 15 to 20 seconds.  He has ptosis, worse on the right.. Trapezius is strong.  The tongue is midline, and the patient has symmetric elevation of the soft palate. No obvious hearing deficits are noted.  Motor:  Muscle bulk is normal.   Tone is normal. Strength is  5 / 5 in all 4 extremities except 4+/5 in the toes.Marland Kitchen  He is able to rise up from a chair 5 times without using his arms.  There was no fatiguing  Sensory: Sensory testing is intact to pinprick, soft touch and vibration sensation in the arms but reduced touch sensation in an S1 distribution in the left foot  Coordination: Cerebellar testing reveals good finger-nose-finger and heel-to-shin bilaterally.  Gait and station: Station is normal.  The gait has a mildly reduced stride and is arthritic.  He has difficulty with tandem gait.. Romberg is negative.   Reflexes: Deep tendon reflexes are symmetric and normal in the arms and knees but absent at the ankles    DIAGNOSTIC DATA (LABS, IMAGING, TESTING) - I reviewed patient records, labs, notes, testing and imaging myself where available.  Lab Results    Component Value Date   WBC 6.1 04/21/2016   HGB 14.3 04/21/2016   HCT 42.0 04/21/2016   MCV 93.9 04/21/2016   PLT 228 04/21/2016      Component Value Date/Time   NA 141 01/02/2017 0920   K 3.8 01/02/2017 0920   CL 101 01/02/2017 0920   CO2 26 01/02/2017 0920   GLUCOSE 131 (H) 01/02/2017 0920   GLUCOSE 99 04/21/2016 1651   BUN 19 01/02/2017 0920   CREATININE 0.84 01/02/2017 0920   CALCIUM 9.0 01/02/2017 0920   PROT 6.3 (L) 04/21/2016 1630   ALBUMIN 3.9 04/21/2016 1630   AST 25 04/21/2016 1630   ALT 21 04/21/2016 1630   ALKPHOS 60 04/21/2016 1630   BILITOT 1.2 04/21/2016 1630   GFRNONAA 83 01/02/2017 0920   GFRAA 96 01/02/2017 0920   No results found for: CHOL, HDL, LDLCALC, LDLDIRECT, TRIG, CHOLHDL Lab Results  Component Value Date   HGBA1C 6.6 (H) 10/24/2014   No results found for: [id number] Lab Results  Component Value Date   TSH 1.402 10/24/2014       ASSESSMENT AND PLAN  Myasthenia gravis (  HCC)  OSA (obstructive sleep apnea)  Ptosis of both eyelids  Degenerative lumbar spinal stenosis  Lumbosacral radiculopathy   1..  Continue Mestinon 60 mg 3 times daily.  If he does not show any improvement after he stops the eyedrops and I would consider immunotherapy with steroid followed by a steroid sparing agent.   2.   Stop eye drops as they contain Tobramycin that may worsen the MG symptoms. 3.   Advised to try to lose weight.   He could not use CPAP 4.    ESI for lumbosacral radiculopathy. 5.    He will return to see me in 4 months but call sooner if he has new or worsening neurologic symptoms.  40 minutes face-to-face evaluation with greater than one half the time counseling and coordinating care about his worsening symptoms from myasthenia gravis and his worsening back pain and abnormal CT myelogram.  Tru Rana A. Felecia Shelling, MD, PhD 08/29/5425, 0:62 PM Certified in Neurology, Clinical Neurophysiology, Sleep Medicine, Pain Medicine and Neuroimaging  [facility] [address] [place], [place] [address] [phone]

## 2018-02-24 ENCOUNTER — Ambulatory Visit: Payer: Self-pay | Admitting: Neurology

## 2018-03-01 ENCOUNTER — Telehealth: Payer: Self-pay | Admitting: Neurology

## 2018-03-01 MED ORDER — PREDNISONE 20 MG PO TABS: 20.0000 mg | ORAL_TABLET | Freq: Every day | ORAL | 0 refills | Status: DC

## 2018-03-01 MED ORDER — PYRIDOSTIGMINE BROMIDE 60 MG PO TABS: 60.0000 mg | ORAL_TABLET | Freq: Four times a day (QID) | ORAL | 3 refills | Status: DC

## 2018-03-01 NOTE — Telephone Encounter (Signed)
Spoke with pt. He has stopped eyedrops, sts. is having increased difficulty holding his head up onset yesterday (Sunday).   Was more active than usual on Saturday. Would like to know about how long after stopping eyedrops before he would see an improvement in MG sx., or if RAS would like to make a change to his tx. plan now/fim

## 2018-03-01 NOTE — Telephone Encounter (Signed)
Pt called stating he took pyridostigmine (MESTINON) 60 MG tablet about 2 hours ago, stating he is unable to raise his head. Pt requesting a call to advise on what he should do.

## 2018-03-01 NOTE — Addendum Note (Signed)
Addended by: France Ravens I on: 03/01/2018 02:29 PM   Modules accepted: Orders

## 2018-03-01 NOTE — Telephone Encounter (Signed)
Per RAS, pt. may increase Mestinon to 60mg  QID, and add Prednisone 20mg  daily.  Spoke with pt. and he is agreeable with this plan. Rx. escribed to [facility]/fim

## 2018-03-03 MED ORDER — PYRIDOSTIGMINE BROMIDE 60 MG PO TABS: 60.0000 mg | ORAL_TABLET | Freq: Four times a day (QID) | ORAL | 3 refills | Status: DC

## 2018-03-03 NOTE — Telephone Encounter (Signed)
Pt called stating that the medication(Pyridostigmine) has worked for his head but only helps his neck for a few hours. Pt requesting a call to discuss.

## 2018-03-03 NOTE — Telephone Encounter (Signed)
Kate/[phone] needs clarification on directions for rx sent today for mestinon. Please call to advise

## 2018-03-03 NOTE — Telephone Encounter (Signed)
Mestinon rx. resent to pharmacy with correct quantity./fim

## 2018-03-03 NOTE — Addendum Note (Signed)
Addended by: France Ravens I on: 03/03/2018 04:08 PM   Modules accepted: Orders

## 2018-03-03 NOTE — Telephone Encounter (Signed)
Spoke with pt. and per RAS, explained he should continue Prednisone and Mestinon at current doses; it has only been 2 days since Mestinon was increased to QID and Prednisone 20mg  daily was added.  He will need more time to realize the full benefit from the Prednisone.  He verbalized understanding of same/fim

## 2018-03-04 ENCOUNTER — Ambulatory Visit
Admission: RE | Admit: 2018-03-04 | Discharge: 2018-03-04 | Disposition: A | Discharge status: Home / Self Care | Payer: Medicare Other | Source: Ambulatory Visit | Attending: Neurology | Admitting: Neurology

## 2018-03-04 DIAGNOSIS — M5417 Radiculopathy, lumbosacral region: Secondary | ICD-10-CM

## 2018-03-04 DIAGNOSIS — M48061 Spinal stenosis, lumbar region without neurogenic claudication: Secondary | ICD-10-CM

## 2018-03-04 DIAGNOSIS — M545 Low back pain: Secondary | ICD-10-CM | POA: Diagnosis not present

## 2018-03-04 MED ORDER — IOPAMIDOL (ISOVUE-M 200) INJECTION 41%
1.0000 mL | Freq: Once | INTRAMUSCULAR | Status: AC
  Administered 2018-03-04: 1 mL via EPIDURAL

## 2018-03-04 MED ORDER — METHYLPREDNISOLONE ACETATE 40 MG/ML INJ SUSP (RADIOLOG
120.0000 mg | Freq: Once | INTRAMUSCULAR | Status: AC
  Administered 2018-03-04: 120 mg via EPIDURAL

## 2018-03-04 NOTE — Discharge Instructions (Signed)

## 2018-03-17 ENCOUNTER — Telehealth: Payer: Self-pay | Admitting: Neurology

## 2018-03-17 MED ORDER — PREDNISONE 20 MG PO TABS: 40.0000 mg | ORAL_TABLET | Freq: Every day | ORAL | 0 refills | Status: DC

## 2018-03-17 NOTE — Addendum Note (Signed)
Addended by: France Ravens I on: 03/17/2018 01:52 PM   Modules accepted: Orders

## 2018-03-17 NOTE — Telephone Encounter (Signed)
Pt states he is having difficulty breathing and neck pain.  Pt is asking for RN Faith to call him, he declined scheduling an appointment before speaking with RN Kyra Searles

## 2018-03-17 NOTE — Telephone Encounter (Signed)
Spoke with pt.  He sts. he is only getting 2-3hours of partial relief of neck weakness with Mestinon 60mg  QID and Prednisone 20mg  QD.  Sts. he has not been taking Mestinon at regular intervals--has been taking most of the 4 doses in the morning.  He will start taking them at more regular intervals, as close to every 6 hours as possible.  Per v/o RAS, he should increase Prednisone to 40mg  daily and call back in one week if not getting better.  At that time, he will consider changing from Mestinon to a different medication. Pt. is agreeable with this plan.  New rx. for Prednisone escribed to [facility]/fim

## 2018-03-23 ENCOUNTER — Telehealth: Payer: Self-pay | Admitting: Cardiology

## 2018-03-23 ENCOUNTER — Ambulatory Visit (INDEPENDENT_AMBULATORY_CARE_PROVIDER_SITE_OTHER): Payer: Medicare Other | Admitting: *Deleted

## 2018-03-23 DIAGNOSIS — I442 Atrioventricular block, complete: Secondary | ICD-10-CM

## 2018-03-23 NOTE — Telephone Encounter (Signed)
Spoke with pt and reminded pt of remote transmission that is due today. Pt verbalized understanding.   

## 2018-03-24 NOTE — Telephone Encounter (Signed)
Pt states he is still having pain in his neck and is asking RN Faith to call him for additional questions and concerns he has

## 2018-03-24 NOTE — Progress Notes (Signed)
Remote pacemaker transmission.   

## 2018-03-24 NOTE — Telephone Encounter (Signed)
Spoke with David [name] who continues to c/o sig. neck weakness despite Mestinon 60mg  QID  and Prednisone 40mg  QD.  Appt. to discuss other tx. options given 03/26/18, 9am/fim

## 2018-03-25 ENCOUNTER — Encounter: Payer: Self-pay | Admitting: Cardiology

## 2018-03-25 NOTE — Progress Notes (Signed)
Letter  

## 2018-03-26 ENCOUNTER — Ambulatory Visit: Payer: Medicare Other | Admitting: Neurology

## 2018-03-26 ENCOUNTER — Other Ambulatory Visit: Payer: Self-pay

## 2018-03-26 ENCOUNTER — Telehealth: Payer: Self-pay | Admitting: *Deleted

## 2018-03-26 ENCOUNTER — Encounter: Payer: Self-pay | Admitting: Neurology

## 2018-03-26 VITALS — BP 140/74 | HR 88 | Resp 20 | Ht 69.0 in | Wt 204.0 lb

## 2018-03-26 DIAGNOSIS — G7000 Myasthenia gravis without (acute) exacerbation: Secondary | ICD-10-CM

## 2018-03-26 DIAGNOSIS — G4733 Obstructive sleep apnea (adult) (pediatric): Secondary | ICD-10-CM

## 2018-03-26 DIAGNOSIS — H02403 Unspecified ptosis of bilateral eyelids: Secondary | ICD-10-CM | POA: Diagnosis not present

## 2018-03-26 DIAGNOSIS — M48061 Spinal stenosis, lumbar region without neurogenic claudication: Secondary | ICD-10-CM

## 2018-03-26 DIAGNOSIS — R1310 Dysphagia, unspecified: Secondary | ICD-10-CM | POA: Insufficient documentation

## 2018-03-26 MED ORDER — MYCOPHENOLATE MOFETIL 250 MG PO CAPS: ORAL_CAPSULE | ORAL | 5 refills | Status: DC

## 2018-03-26 NOTE — Telephone Encounter (Signed)
PA for Mycophenolate 250mg  capsules completed via Cover My Meds. Key# [id number].  Dx: Myasthenia Gravis (G70.00).  Tried and failed: Mestinon, Prednsione/fim

## 2018-03-26 NOTE — Patient Instructions (Addendum)
1.    Can increase the pyridostigmine 60 mg to 5 pills a day. 2.    Prednisone 40 mg daily (2 pills.day) 3.    Begin mycophenolate (CellCept) 250 mg twice a day for two weeks.    Then increase to 2 pills twice a day. 4.    We will set up IVIg with a Experiment 5.    Use CPAP every night

## 2018-03-26 NOTE — Progress Notes (Signed)
[facility]  PATIENT: David [name] DOB: August 05, 1935  REFERRING DOCTOR OR PCP:  [name] (PCP) and Rutherford Guys (Ophtho) SOURCE: Patient, records from Dr. Shelia Media, Lab results, CPAP download  _________________________________   HISTORICAL  CHIEF COMPLAINT:  Chief Complaint  Patient presents with  . Myasthenia Gravis    Pt. here today with continued c/o neck weakness for most of the day, despite compliance with Mestinon and Prednisone.  Would also like to discuss difficulty with chewing, swallowing, night time breathing,/fim    HISTORY OF PRESENT ILLNESS:  David [name]  is a 82 y.o. man with just left on LV done about 3 minutes okay myasthenia and OSA.     Update 03/26/2018: He is having worsening muscle weakness despite prednisone and mestinon.   He notes his strength is better early in the morning but by mid morning he has more ptosis and his neck becomes weaker.  He takes mestinon 60 mg po qid.   He is taking prednisone 40 mg po qd as well.     He notes neck weakness is still bad after mid morning.  His daughter is a Astronomer and notes that he has trouble eating a meal unless soft.   He notes he tires out easily with meals.    He does best with apple sauce consistency.   With soft food, he does not appear to be aspirating.  He has trouble falling asleep and staying asleep.    He has OSA but has trouble using CPAP.   He had a Tax adviser in [place].   He will bring by the reports as I could not find anything in the EMR.    He has left-sided lumbar radiculopathy (he has L3-L4 spinal stenosis and severe bilateral foraminal narrowing at L5-S1 with pars defects and anterolisthesis).   He had some benefit from an epidural steroid injection.    Update 02/23/2018: He reports having issues with redness in his eyes, R > L, and was prescribed eye drops.  While this was happening, he had more eyelid drooping.   He had more drooping after the drops than  before the drops.   He saw his ophthalmologist (Dr. Gershon Crane) and then was placed on Tobramycin drops.   The infection has seemd cleared up the last 24 hours.    He is on pyridostigmine 60 mg po tid.   He toleratre  He reports pian in the left > right hamstrings and into the calf.   Sometimes there is just calf pain.   I took another look at his lumbar spine CT myelogram from earlier this year.  It does show multilevel degenerative changes with spinal stenosis at L3-L4 and significant anterolisthesis associated with pars defects at L5-S1 causing severe bilateral neuroforaminal stenosis at that level.  Either of these findings could be related to his symptoms.  He is not interested in surgery at this time and we discussed some other options.  IMPRESSION: 1. Diffuse lumbar disc and facet degeneration. 2. Multifactorial spinal stenosis at L3-4, severe with standing. Moderate bilateral neural foraminal stenosis. 3. L5 pars defects with grade 1/2 anterolisthesis which slightly increases with standing. Severe bilateral neural foraminal stenosis. 4. Mild-to-moderate left lateral recess stenosis at L2-3.   Update 11/06/2017: He is on Mestinon for his MG and he tolerates it well.   He is on only 30 mg 2-3 and symptoms improved.     He had a possible abscess on the left near his left ear and was  prescribed clindamycin with benefit.   He then had a similar issue on the right with swelling so was placed on it again, with less benefit.  Due to continued pain, he was prescribed cyclobenzaprine 10/28/17 and felt weaker in his legs.   He only took one pill a day x 5 days      He has OSA but has not been using his CPAP.  We discussed trying to use it nightly.     Update 05/12/2017:     He feels the MG is doing well.   He takes mestinon 1/2 pill bid and tolerates it well.   He denies diplopia. No proximal weakness.  He has OSA and a new CPAP machine was ordered with a different mask.    He hasn't set it up as the  bedroom was moved around (wife had knee surgery).  He snores but does not wake up.   He sleeps 5-6 hours sometimes wakingup once to urinate.   He rarely dozes off unintentionally, only if tired.      _________________________________________ From 12/31/2016  MG:    His ptosis improved with Mestinon. He had trouble tolerating higher dosages but notes continued clinical benefit benefit at a relatively low dose of 30 mg 2-3 times a day (usually takes twice a day but will take a third pill if he could be out of the house).    Higher dose caused diarrhea.    He denies any weakness in the shoulders or hips. He is able to stand up from a chair multiple times without using his arms he can stand up from a squatting position.    Vallecular mass?:   We had checked CT scan of the chest and neck to rule out thymoma.   None was found.   However, there was asymmetry with the possible soft tissue mass involving the right vallecular.      He is not a smoker or drinker or heavy drinker.    He saw Dr. Benjamine Mola of ENT and had a scope and nothing   OSA;  He has OSA ans is on CPAP.    PSG 01/07/99 showed an RDI = 99 and PSG 2004 showed RDI = 78.   He has a new machine since 2013 (Resmed).    He stopped CPAP 2 years ago because of comfort issues.     He occasionally nods off due to EDS but usually does not.      MG History:   He had the onset of ptosis, while traveling in [place] and [place], about 3 weeks ago. He first noted the ptosis while trying to watch a movie in the bus (screen was up high) and noting that the eyelids were covering his vision.  He did note the trip was stressful and he and his wife both had injuries (he sprained ankle and wife broke shoulder).   Lab work was performed and the acetylcholine binding antibodies were elevated at 1.85 (less than 0.24 normal). 57% were blocking antibodies and 25% or modulating antibodies.   Other lab work was noncontributory.     CT scan of the head showed age-related mild chronic  microvascular ischemic change.      REVIEW OF SYSTEMS: Constitutional: No fevers, chills, sweats, or change in appetite Eyes: No visual changes, double vision, eye pain Ear, nose and throat: No hearing loss, ear pain, nasal congestion, sore throat Cardiovascular: No chest pain, palpitations Respiratory: No shortness of breath at rest or with exertion.  No wheezes GastrointestinaI: No nausea, vomiting, diarrhea, abdominal pain, fecal incontinence Genitourinary: No dysuria, urinary retention or frequency.  No nocturia. Musculoskeletal: No neck pain, back pain Integumentary: No rash, pruritus, skin lesions Neurological: as above Psychiatric: No depression at this time.  No anxiety Endocrine: No palpitations, diaphoresis, change in appetite, change in weigh or increased thirst Hematologic/Lymphatic: No anemia, purpura, petechiae. Allergic/Immunologic: No itchy/runny eyes, nasal congestion, recent allergic reactions, rashes  ALLERGIES: Allergies  Allergen Reactions  . Gris-Peg [Griseofulvin] Other (See Comments)    headaches  . Terbinafine Other (See Comments)    headache    HOME MEDICATIONS:  Current Outpatient Medications:  .  alfuzosin (UROXATRAL) 10 MG 24 hr tablet, , Disp: , Rfl:  .  aspirin EC 81 MG tablet, Take 81 mg by mouth daily. , Disp: , Rfl:  .  ezetimibe-simvastatin (VYTORIN) 10-40 MG per tablet, Take 1 tablet by mouth at bedtime., Disp: , Rfl:  .  fish oil-omega-3 fatty acids 1000 MG capsule, Take 1 g by mouth daily. , Disp: , Rfl:  .  glucosamine-chondroitin 500-400 MG tablet, Take 1 tablet by mouth daily. , Disp: , Rfl:  .  loratadine (CLARITIN) 10 MG tablet, Take 10 mg by mouth daily., Disp: , Rfl:  .  Multiple Vitamins-Minerals (MULTIVITAMIN WITH MINERALS) tablet, Take 1 tablet by mouth daily., Disp: , Rfl:  .  nitroGLYCERIN (NITROSTAT) 0.4 MG SL tablet, Place 1 tablet (0.4 mg total) under the tongue every 5 (five) minutes as needed for chest pain. Reported  on 11/01/2015, Disp: 25 tablet, Rfl: 1 .  predniSONE (DELTASONE) 20 MG tablet, Take 2 tablets (40 mg total) by mouth daily., Disp: 180 tablet, Rfl: 0 .  pyridostigmine (MESTINON) 60 MG tablet, Take 1 tablet (60 mg total) by mouth 4 (four) times daily. 1/2 to 1 pill po three times a day, Disp: 360 tablet, Rfl: 3 .  tamsulosin (FLOMAX) 0.4 MG CAPS capsule, Take 0.4 mg by mouth daily. Reported on 11/01/2015, Disp: , Rfl:  .  triamterene-hydrochlorothiazide (MAXZIDE-25) 37.5-25 MG per tablet, Take 1 tablet by mouth daily., Disp: , Rfl:  .  mycophenolate (CELLCEPT) 250 MG capsule, Take one pill po twice a day for two weeks, then increase to 2 pills po twice a day, Disp: 120 capsule, Rfl: 5 .  vitamin C (ASCORBIC ACID) 250 MG tablet, Take 250 mg by mouth daily., Disp: , Rfl:   PAST MEDICAL HISTORY: Past Medical History:  Diagnosis Date  . CAD (coronary artery disease)    previous stent to LAD 2003  . Cancer ([place])    skin  . Hyperlipidemia   . Hypertension   . LBBB (left bundle branch block)   . Myasthenia gravis ([place])   . S/P cardiac pacemaker procedure, placement 10/24/14 Medtronic Adapta L model ADDRL 1  10/25/2014    PAST SURGICAL HISTORY: Past Surgical History:  Procedure Laterality Date  . CORONARY ANGIOPLASTY WITH STENT PLACEMENT  03/01/2002   stenting to LAD - Dr. Glade Lloyd  . NM MYOCAR PERF WALL MOTION  11/27/2010   normal  . PERMANENT PACEMAKER INSERTION N/A 10/24/2014   Procedure: PERMANENT PACEMAKER INSERTION;  Surgeon: [name], MD;  Location: [facility];  Service: Cardiovascular;  Laterality: N/A;  . TONSILLECTOMY    . US ECHOCARDIOGRAPHY  12/05/2010   proximal septal thickening,mild concentric LVH,mild deptal hypokinesis,RV mildly dilated,LA mildly dilated,mild AI,moderate aortic root dilatation,septal motion c/w conduction abnormality    FAMILY HISTORY: Family History  Problem Relation Age of Onset  . Breast  cancer Mother   . Colon cancer Father   . Lung cancer Father      SOCIAL HISTORY:  Social History   Socioeconomic History  . Marital status: Married    Spouse name: Not on file  . Number of children: Not on file  . Years of education: Not on file  . Highest education level: Not on file  Occupational History  . Not on file  Social Needs  . Financial resource strain: Not on file  . Food insecurity:    Worry: Not on file    Inability: Not on file  . Transportation needs:    Medical: Not on file    Non-medical: Not on file  Tobacco Use  . Smoking status: Former Smoker    Types: Cigarettes, Cigars    Last attempt to quit: 10/27/1982    Years since quitting: 35.4  . Smokeless tobacco: Never Used  Substance and Sexual Activity  . Alcohol use: Yes    Comment: 1-2 drink per week  . Drug use: No  . Sexual activity: Not on file  Lifestyle  . Physical activity:    Days per week: Not on file    Minutes per session: Not on file  . Stress: Not on file  Relationships  . Social connections:    Talks on phone: Not on file    Gets together: Not on file    Attends religious service: Not on file    Active member of club or organization: Not on file    Attends meetings of clubs or organizations: Not on file    Relationship status: Not on file  . Intimate partner violence:    Fear of current or ex partner: Not on file    Emotionally abused: Not on file    Physically abused: Not on file    Forced sexual activity: Not on file  Other Topics Concern  . Not on file  Social History Narrative  . Not on file     PHYSICAL EXAM  Vitals:   03/26/18 0913  BP: 140/74  Pulse: 88  Resp: 20  Weight: 204 lb (92.5 kg)  Height: 5\' 9"  (1.753 m)    Body mass index is 30.13 kg/m.     General: The patient is well-developed and well-nourished and in no acute distress   Neurologic Exam  Mental status: The patient is alert and oriented x 3 at the time of the examination. The patient has apparent normal recent and remote memory, with an apparently  normal attention span and concentration ability.   Speech is normal.  Cranial nerves: Extraocular muscles show a reduced upgaze on the right.  There is ptosis much worse on the right than the left.  He has reduced neck extension strength.  Voice was strong.. Trapezius is strong.  The tongue is midline, and the patient has symmetric elevation of the soft palate. No obvious hearing deficits are noted.  Motor:  Muscle bulk is normal.   Tone is normal. Strength is  5 / 5 in all 4 extremities except 4+/5 in the toes.Marland Kitchen He is able to rise up several times from the chair without using his arms.   Sensory: He has reduced sensation in the left foot.  Coordination: Cerebellar testing reveals good finger-nose-finger and heel-to-shin bilaterally.  Gait and station: Station is normal.  He has a reduced stride and and arthritic gait.  He cannot do a tandem walk. Romberg is negative.   Reflexes: Deep tendon reflexes are symmetric  and normal in the arms and knees but absent at the ankles    DIAGNOSTIC DATA (LABS, IMAGING, TESTING) - I reviewed patient records, labs, notes, testing and imaging myself where available.  Lab Results  Component Value Date   WBC 6.1 04/21/2016   HGB 14.3 04/21/2016   HCT 42.0 04/21/2016   MCV 93.9 04/21/2016   PLT 228 04/21/2016      Component Value Date/Time   NA 141 01/02/2017 0920   K 3.8 01/02/2017 0920   CL 101 01/02/2017 0920   CO2 26 01/02/2017 0920   GLUCOSE 131 (H) 01/02/2017 0920   GLUCOSE 99 04/21/2016 1651   BUN 19 01/02/2017 0920   CREATININE 0.84 01/02/2017 0920   CALCIUM 9.0 01/02/2017 0920   PROT 6.3 (L) 04/21/2016 1630   ALBUMIN 3.9 04/21/2016 1630   AST 25 04/21/2016 1630   ALT 21 04/21/2016 1630   ALKPHOS 60 04/21/2016 1630   BILITOT 1.2 04/21/2016 1630   GFRNONAA 83 01/02/2017 0920   GFRAA 96 01/02/2017 0920   No results found for: CHOL, HDL, LDLCALC, LDLDIRECT, TRIG, CHOLHDL Lab Results  Component Value Date   HGBA1C 6.6 (H) 10/24/2014    No results found for: VITAMINB12 Lab Results  Component Value Date   TSH 1.402 10/24/2014       ASSESSMENT AND PLAN  Myasthenia gravis ([place])  Ptosis of both eyelids  Degenerative lumbar spinal stenosis  OSA (obstructive sleep apnea)  Dysphagia, unspecified type   1.. He has worsening myasthenia gravis with predominant ocular, neck  and pharyngeal weakness. Mestinon 60 mg po increase to 5 x/day.    begin CellCept 250 mg twice a day and after 2 weeks increase to 500 mg twice a day.   Continue prednisone 40 mg.     2.   Because he is worsening with some difficulty swallowing we will begin IVIG.  IVIG 80 g/d x2 days (160 g total) which is about 2 g/kg for the first cycle then every 4 weeks 80 g x 1 day 3.   Advised to try to lose weight.   He does not like using CPAP and I have encouraged him to try to use it.   4.   I discussed with him and his family that there is not an absolute contraindication for him to fly (wants to visit his son in [place]), there is always some risk that he could worsen while traveling and being away from doctors.  If he does note significant worsening while traveling he should go to the emergency room.   He will return to see me in 2 months but call sooner if he has new or worsening neurologic symptoms.  45-minute face-to-face evaluation with greater than one half the time counseling and coordinating care about his worsening symptoms related to myasthenia gravis and the need to begin immunosuppressive therapy.   Emmely Bittinger A. Felecia Shelling, MD, PhD 0/94/7096, 2:83 PM Certified in Neurology, Clinical Neurophysiology, Sleep Medicine, Pain Medicine and Neuroimaging  [facility] [address], [place], [place] [address] [phone]

## 2018-03-30 NOTE — Telephone Encounter (Signed)
Fax received from [facility] (phone# [phone]).  Mycophenolate Mofetil PA approved until 03/27/19.  No PA#.  Member ID: [id number]

## 2018-04-02 ENCOUNTER — Telehealth: Payer: Self-pay | Admitting: *Deleted

## 2018-04-02 NOTE — Telephone Encounter (Signed)
IVIG orders were originally sent to [facility]. We received notification today that they are out-of-network for the patient.  His orders have now been sent to [facility].  Marquis Lunch, RN is our contact person w/ [employer].  Her contact number is [phone].  The fax number is [phone].  I have also called [facility] to notify them that his IVIG orders have been denied and sent to another company.

## 2018-04-05 NOTE — Telephone Encounter (Addendum)
Spoke to [employer] at [employer] who states they will require a letter from Dr. Felecia Shelling stating the patient is having a MG exacerbation and plasma exchange would not be beneficial. Dr. Felecia Shelling is out of the office today.  Additionally, we received an email from Tamsen Roers at [facility] that they were able to get this patient's IVIG approved through his secondary insurance.  She apologized for the confusion last week.  I have requested [employer] to cancel his orders and [facility] is going to complete his case and get him started.  [facility] contact:  Tamsen Roers [phone].

## 2018-04-06 NOTE — Telephone Encounter (Signed)
I have spoken with Robin at [employer]. She sts. pt. has declined all services from [facility] stating he doesn't feel comfortable with infusion./fim

## 2018-04-06 NOTE — Telephone Encounter (Signed)
Spoke with Robin at [employer].  She sts. they are approved and ready to infuse pt's IVIG,approval is good for one year, however pt. sts. this is his "travel month" and has declined IVIG infusion until after 05/06/18.  I have explained ok to infuse as soon as pt. is agreeable/fim

## 2018-04-13 LAB — CUP PACEART REMOTE DEVICE CHECK
Battery Remaining Longevity: 111 mo
Battery Voltage: 2.78 V
Brady Statistic AP VP Percent: 2 %
Brady Statistic AS VP Percent: 96 %
Implantable Lead Implant Date: 20160329
Implantable Lead Model: 5092
Implantable Pulse Generator Implant Date: 20160329
Lead Channel Impedance Value: 521 Ohm
Lead Channel Pacing Threshold Amplitude: 0.625 V
Lead Channel Setting Pacing Amplitude: 2 V
Lead Channel Setting Pacing Amplitude: 2.5 V
Lead Channel Setting Pacing Pulse Width: 0.4 ms
Lead Channel Setting Sensing Sensitivity: 2.8 mV
MDC IDC LEAD IMPLANT DT: 20160329
MDC IDC LEAD LOCATION: 753859
MDC IDC LEAD LOCATION: 753860
MDC IDC MSMT BATTERY IMPEDANCE: 236 Ohm
MDC IDC MSMT LEADCHNL RA PACING THRESHOLD PULSEWIDTH: 0.4 ms
MDC IDC MSMT LEADCHNL RV IMPEDANCE VALUE: 673 Ohm
MDC IDC MSMT LEADCHNL RV PACING THRESHOLD AMPLITUDE: 0.75 V
MDC IDC MSMT LEADCHNL RV PACING THRESHOLD PULSEWIDTH: 0.4 ms
MDC IDC SESS DTM: 20190827205518
MDC IDC STAT BRADY AP VS PERCENT: 0 %
MDC IDC STAT BRADY AS VS PERCENT: 2 %

## 2018-04-28 ENCOUNTER — Encounter: Payer: Self-pay | Admitting: Pulmonary Disease

## 2018-04-28 ENCOUNTER — Ambulatory Visit: Payer: Medicare Other | Admitting: Pulmonary Disease

## 2018-04-28 VITALS — BP 102/78 | HR 96 | Ht 68.5 in | Wt 205.4 lb

## 2018-04-28 DIAGNOSIS — G4733 Obstructive sleep apnea (adult) (pediatric): Secondary | ICD-10-CM

## 2018-04-28 NOTE — Progress Notes (Signed)
David [name]    [id number]    08-26-1935  Primary Care Physician:Pharr, David Jew, David [name]  Referring Physician: [name], David [name] David [name], [place] [address]  Chief complaint:   Shortness of breath, obstructive sleep apnea  HPI:  Obstructive sleep apnea was diagnosed a couple years ago-has not been using CPAP on a regular basis because of mask issues, this is now resolved Has a history of myasthenia gravis-back in July was having issues with his neck, had his medications adjusted-neck issues are better He has managed to lose about 20 pounds recently  He feels his breathing is better He feels he will use CPAP regularly-give it more effort He does have prediabetes Has what he described as a foot drop  He states that his sleep issues are managed by his neurologist  Occupation: Retired Exposures: No significant exposure Smoking history: Reformed smoker Travel history: No recent significant travel  Outpatient Encounter Medications as of 04/28/2018  Medication Sig  . alfuzosin (UROXATRAL) 10 MG 24 hr tablet   . aspirin EC 81 MG tablet Take 81 mg by mouth daily.   Marland Kitchen ezetimibe-simvastatin (VYTORIN) 10-40 MG per tablet Take 1 tablet by mouth at bedtime.  . fish oil-omega-3 fatty acids 1000 MG capsule Take 1 g by mouth daily.   Marland Kitchen glucosamine-chondroitin 500-400 MG tablet Take 1 tablet by mouth daily.   Marland Kitchen loratadine (CLARITIN) 10 MG tablet Take 10 mg by mouth daily.  . Multiple Vitamins-Minerals (MULTIVITAMIN WITH MINERALS) tablet Take 1 tablet by mouth daily.  . mycophenolate (CELLCEPT) 250 MG capsule Take one pill po twice a day for two weeks, then increase to 2 pills po twice a day  . nitroGLYCERIN (NITROSTAT) 0.4 MG SL tablet Place 1 tablet (0.4 mg total) under the tongue every 5 (five) minutes as needed for chest pain. Reported on 11/01/2015  . predniSONE (DELTASONE) 20 MG tablet Take 2 tablets (40 mg total) by mouth daily.  Marland Kitchen pyridostigmine  (MESTINON) 60 MG tablet Take 1 tablet (60 mg total) by mouth 4 (four) times daily. 1/2 to 1 pill po three times a day (Patient taking differently: Take 60 mg by mouth 5 (five) times daily. 1/2 to 1 pill po three times a day)  . tamsulosin (FLOMAX) 0.4 MG CAPS capsule Take 0.4 mg by mouth daily. Reported on 11/01/2015  . triamterene-hydrochlorothiazide (MAXZIDE-25) 37.5-25 MG per tablet Take 1 tablet by mouth daily.  . vitamin C (ASCORBIC ACID) 250 MG tablet Take 250 mg by mouth daily.   No facility-administered encounter medications on file as of 04/28/2018.     Allergies as of 04/28/2018 - Review Complete 04/28/2018  Allergen Reaction Noted  . Gris-peg [griseofulvin] Other (See Comments) 03/25/2012  . Terbinafine Other (See Comments) 10/09/2013    Past Medical History:  Diagnosis Date  . CAD (coronary artery disease)    previous stent to LAD 2003  . Cancer ([place])    skin  . Hyperlipidemia   . Hypertension   . LBBB (left bundle branch block)   . Myasthenia gravis (David [name])   . S/P cardiac pacemaker procedure, placement 10/24/14 Medtronic Adapta L model ADDRL 1  10/25/2014    Past Surgical History:  Procedure Laterality Date  . CORONARY ANGIOPLASTY WITH STENT PLACEMENT  03/01/2002   stenting to LAD - Dr. Glade [name]  . NM MYOCAR PERF WALL MOTION  11/27/2010   normal  . PERMANENT PACEMAKER INSERTION  N/A 10/24/2014   Procedure: PERMANENT PACEMAKER INSERTION;  Surgeon: David Grayer, David [name];  Location: [facility] CATH LAB;  Service: Cardiovascular;  Laterality: N/A;  . TONSILLECTOMY    . US ECHOCARDIOGRAPHY  12/05/2010   proximal septal thickening,mild concentric LVH,mild deptal hypokinesis,RV mildly dilated,LA mildly dilated,mild AI,moderate aortic root dilatation,septal motion c/w conduction abnormality    Family History  Problem Relation Age of Onset  . Breast cancer Mother   . Colon cancer Father   . Lung cancer Father     Social History   Socioeconomic History  . Marital status: Married    Spouse  name: Not on file  . Number of children: Not on file  . Years of education: Not on file  . Highest education level: Not on file  Occupational History  . Not on file  Social Needs  . Financial resource strain: Not on file  . Food insecurity:    Worry: Not on file    Inability: Not on file  . Transportation needs:    Medical: Not on file    Non-medical: Not on file  Tobacco Use  . Smoking status: Former Smoker    Types: Cigarettes, Cigars    Start date: 04/28/1949    Last attempt to quit: 10/27/1982    Years since quitting: 35.5  . Smokeless tobacco: Never Used  Substance and Sexual Activity  . Alcohol use: Yes    Comment: 1-2 drink per week  . Drug use: No  . Sexual activity: Not on file  Lifestyle  . Physical activity:    Days per week: Not on file    Minutes per session: Not on file  . Stress: Not on file  Relationships  . Social connections:    Talks on phone: Not on file    Gets together: Not on file    Attends religious service: Not on file    Active member of club or organization: Not on file    Attends meetings of clubs or organizations: Not on file    Relationship status: Not on file  . Intimate partner violence:    Fear of current or ex partner: Not on file    Emotionally abused: Not on file    Physically abused: Not on file    Forced sexual activity: Not on file  Other Topics Concern  . Not on file  Social History Narrative  . Not on file    Review of Systems  Constitutional: Negative.  Negative for fatigue, fever and unexpected weight change.  HENT: Negative.   Eyes: Negative.   Respiratory: Positive for apnea. Negative for chest tightness, wheezing and stridor.   Cardiovascular: Negative.  Negative for palpitations and leg swelling.  Gastrointestinal: Negative.   Endocrine: Negative.   Genitourinary: Negative.   Musculoskeletal: Positive for arthralgias.  Hematological: Negative.     There were no vitals filed for this visit.   Physical Exam    Constitutional: He is oriented to person, place, and time. He appears well-developed and well-nourished.  HENT:  Head: Normocephalic and atraumatic.  Mallampati 4  Eyes: Pupils are equal, round, and reactive to light. Conjunctivae and EOM are normal. Right eye exhibits no discharge. Left eye exhibits no discharge.  Neck: Normal range of motion. Neck supple. No tracheal deviation present. No thyromegaly present.  Cardiovascular: Normal rate and regular rhythm.  Pulmonary/Chest: Effort normal and breath sounds normal. No respiratory distress. He has no wheezes.  Abdominal: Soft. Bowel sounds are normal. He exhibits no distension. There is  no tenderness.  Musculoskeletal: Normal range of motion. He exhibits no edema.  Neurological: He is alert and oriented to person, place, and time. No cranial nerve deficit.  Skin: Skin is warm and dry. No erythema.  Psychiatric: He has a normal mood and affect.     Data Reviewed: Sleep study reviewed from 12/06/2015  Assessment:  Obstructive sleep apnea-not fully treated at present Diagnosed a couple years ago Never really used CPAP on a regular basis because of mask issues He feels he will be using it regularly from now-this was discussed extensively how this impacts his energy levels and daily activities He will follow-up with his neurologist Obtain and compliance data will help with management  Myasthenia gravis-improved symptoms He believes his recent medication adjustments have helped symptoms significantly  Deconditioning The importance of regular exercise was discussed with the patient so I does not lose more muscle function He is concerned about his foot drop-this is been addressed He will benefit from physical therapy  Dysphagia-improved Symptoms are improved with his adjustment of Mestinon   History of coronary artery disease No recent symptoms No chest pains or chest discomfort  History of complete heart block-has a pacemaker in  place  Plan/Recommendations: Continue to follow up regularly regarding the sleep apnea Encouraged to use CPAP on a regular basis Download from his machine will help with optimizing care Regular exercise is encouraged  I will see him here as needed He feels well, feels his symptoms are well addressed currently   Sherrilyn Rist [name] 04/28/2018, 9:20 AM  CC: David Pretty, David [name]

## 2018-04-28 NOTE — Patient Instructions (Signed)
History of myasthenia gravis Obstructive sleep apnea-he is now trying to use them machine on a regular basis Recent mask change  He was having difficulty with his breathing and his mask prior to appointment been made, issues have resolved  Encourage use of CPAP on a regular basis  Encouraged regular exercises  Will benefit from physical therapy  Obtain regular downloads to assess CPAP compliance and efficiency  I will see you in the office as needed

## 2018-04-30 ENCOUNTER — Ambulatory Visit: Payer: Medicare Other | Admitting: Podiatry

## 2018-04-30 ENCOUNTER — Encounter: Payer: Self-pay | Admitting: Podiatry

## 2018-04-30 DIAGNOSIS — R2689 Other abnormalities of gait and mobility: Secondary | ICD-10-CM

## 2018-04-30 DIAGNOSIS — Z9181 History of falling: Secondary | ICD-10-CM | POA: Diagnosis not present

## 2018-04-30 DIAGNOSIS — R269 Unspecified abnormalities of gait and mobility: Secondary | ICD-10-CM

## 2018-04-30 DIAGNOSIS — L600 Ingrowing nail: Secondary | ICD-10-CM | POA: Diagnosis not present

## 2018-04-30 DIAGNOSIS — L608 Other nail disorders: Secondary | ICD-10-CM

## 2018-04-30 MED ORDER — NEOMYCIN-POLYMYXIN-HC 3.5-10000-1 OT SOLN: OTIC | 0 refills | Status: DC

## 2018-04-30 NOTE — Progress Notes (Signed)
Subjective:  Patient ID: David [name], male    DOB: 05/26/1936,  MRN: [id number]  Chief Complaint  Patient presents with  . Nail Problem    Left foot; great toe & 5th toe; pt stated, "I want to have both of my nails removed and wants to ask about Physical Therapy for my feet"    82 y.o. male presents with the above complaint.  Would like to discuss removal of the left great toe and fifth toe nails.  States that the nails of ingrowing to the skin he would like to have it removed.  Separate complaint of issues with his balance reports a fall a few months ago at [employer].  Feels very unsteady on his feet.  Review of Systems: Negative except as noted in the HPI. Denies N/V/F/Ch.  Past Medical History:  Diagnosis Date  . CAD (coronary artery disease)    previous stent to LAD 2003  . Cancer ([place])    skin  . Hyperlipidemia   . Hypertension   . LBBB (left bundle branch block)   . Myasthenia gravis ([place])   . S/P cardiac pacemaker procedure, placement 10/24/14 Medtronic Adapta L model ADDRL 1  10/25/2014    Current Outpatient Medications:  .  alfuzosin (UROXATRAL) 10 MG 24 hr tablet, , Disp: , Rfl:  .  aspirin EC 81 MG tablet, Take 81 mg by mouth daily. , Disp: , Rfl:  .  ezetimibe-simvastatin (VYTORIN) 10-40 MG per tablet, Take 1 tablet by mouth at bedtime., Disp: , Rfl:  .  fish oil-omega-3 fatty acids 1000 MG capsule, Take 1 g by mouth daily. , Disp: , Rfl:  .  glucosamine-chondroitin 500-400 MG tablet, Take 1 tablet by mouth daily. , Disp: , Rfl:  .  loratadine (CLARITIN) 10 MG tablet, Take 10 mg by mouth daily., Disp: , Rfl:  .  Multiple Vitamins-Minerals (MULTIVITAMIN WITH MINERALS) tablet, Take 1 tablet by mouth daily., Disp: , Rfl:  .  mycophenolate (CELLCEPT) 250 MG capsule, Take one pill po twice a day for two weeks, then increase to 2 pills po twice a day, Disp: 120 capsule, Rfl: 5 .  nitroGLYCERIN (NITROSTAT) 0.4 MG SL tablet, Place 1 tablet (0.4 mg total) under the  tongue every 5 (five) minutes as needed for chest pain. Reported on 11/01/2015, Disp: 25 tablet, Rfl: 1 .  predniSONE (DELTASONE) 20 MG tablet, Take 2 tablets (40 mg total) by mouth daily., Disp: 180 tablet, Rfl: 0 .  pyridostigmine (MESTINON) 60 MG tablet, Take 1 tablet (60 mg total) by mouth 4 (four) times daily. 1/2 to 1 pill po three times a day (Patient taking differently: Take 60 mg by mouth 5 (five) times daily. 1/2 to 1 pill po three times a day), Disp: 360 tablet, Rfl: 3 .  tamsulosin (FLOMAX) 0.4 MG CAPS capsule, Take 0.4 mg by mouth daily. Reported on 11/01/2015, Disp: , Rfl:  .  triamterene-hydrochlorothiazide (MAXZIDE-25) 37.5-25 MG per tablet, Take 1 tablet by mouth daily., Disp: , Rfl:  .  vitamin C (ASCORBIC ACID) 250 MG tablet, Take 250 mg by mouth daily., Disp: , Rfl:  .  neomycin-polymyxin-hydrocortisone (CORTISPORIN) OTIC solution, Apply 2 drops to the ingrown toenail site twice daily. Cover with band-aid., Disp: 10 mL, Rfl: 0  Social History   Tobacco Use  Smoking Status Former Smoker  . Types: Cigarettes, Cigars  . Start date: 04/28/1949  . Last attempt to quit: 10/27/1982  . Years since quitting: 35.5  Smokeless Tobacco Never Used  Allergies  Allergen Reactions  . Gris-Peg [Griseofulvin] Other (See Comments)    headaches  . Terbinafine Other (See Comments)    headache   Objective:  There were no vitals filed for this visit. There is no height or weight on file to calculate BMI. Constitutional Well developed. Well nourished.  Vascular Dorsalis pedis pulses palpable bilaterally. Posterior tibial pulses palpable bilaterally. Capillary refill normal to all digits.  No cyanosis or clubbing noted. Pedal hair growth normal.  Neurologic Normal speech. Oriented to person, place, and time. Epicritic sensation to light touch grossly present bilaterally.  Dermatologic Painful pincer  No other open wounds. No skin lesions.  Orthopedic: Normal joint ROM without pain or  crepitus bilaterally. No visible deformities. No bony tenderness.   Balance Assessment:  Stay Indepenent Self-Questionnaire: 10 (scores >4 indicative of fall risk) Timed Up and Go: 14 seconds (>12 seconds indicative of fall risk) 30-Second Chair Stand Test: 10 (10 normal for age and gender ; below average indicative of fall risk) Full Tandem Stance: Unable to perform.  (<10 seconds indicative of fall risk)  Radiographs: None Assessment:   1. Pincer nail deformity   2. Ingrown nail   3. Gait disorder   4. Balance disorder   5. At high risk for injury related to fall    Plan:  Patient was evaluated and treated and all questions answered.  Dystrophic Ingrown Nail, left -Patient elects to proceed with minor surgery to remove ingrown toenail removal today. Consent reviewed and signed by patient.  The patient is at higher risk due to his geriatric status he has good capillary refill and pedal hair to the digits that we remove the toenails from today. -Ingrown nail excised. See procedure note. -Educated on post-procedure care including soaking. Written instructions provided and reviewed. -Patient to follow up in 2 weeks for nail check.  Procedure: Excision of Toenail Location: Left 1st toe and 5th toe  Anesthesia: Lidocaine 1% plain; 1.5 mL and Marcaine 0.5% plain; 1.5 mL, digital block. Skin Prep: Betadine. Dressing: Silvadene; telfa; dry, sterile, compression dressing. Technique: Following skin prep, the toe was exsanguinated and a tourniquet was secured at the base of the toe. The nail was freed and excised. Chemical matrixectomy was then performed with phenol and irrigated out with alcohol. The tourniquet was then removed and sterile dressing applied. Disposition: Patient tolerated procedure well. Patient to return in 2 weeks for follow-up.   Gait disturbance with fall risk. -Comprehensive balance examination performed today.  Patient found to have 3 balance test performed today  and combined with the scores of his study questionnaire is a fall risk.  Recommend fabrication of balance braces.  Educated at length upon the benefits of these braces.  Patient wishes to proceed.  We will schedule patient for fabrication of the braces.  Return in about 2 weeks (around 05/14/2018) for Nail Check.

## 2018-04-30 NOTE — Patient Instructions (Signed)

## 2018-05-04 ENCOUNTER — Other Ambulatory Visit: Payer: Medicare Other | Admitting: Orthotics

## 2018-05-05 DIAGNOSIS — R7303 Prediabetes: Secondary | ICD-10-CM | POA: Diagnosis not present

## 2018-05-05 DIAGNOSIS — I2510 Atherosclerotic heart disease of native coronary artery without angina pectoris: Secondary | ICD-10-CM | POA: Diagnosis not present

## 2018-05-10 DIAGNOSIS — Z85828 Personal history of other malignant neoplasm of skin: Secondary | ICD-10-CM | POA: Diagnosis not present

## 2018-05-10 DIAGNOSIS — L820 Inflamed seborrheic keratosis: Secondary | ICD-10-CM | POA: Diagnosis not present

## 2018-05-10 DIAGNOSIS — D485 Neoplasm of uncertain behavior of skin: Secondary | ICD-10-CM | POA: Diagnosis not present

## 2018-05-11 ENCOUNTER — Encounter: Payer: Self-pay | Admitting: Neurology

## 2018-05-12 ENCOUNTER — Other Ambulatory Visit: Payer: Self-pay

## 2018-05-12 ENCOUNTER — Encounter: Payer: Self-pay | Admitting: Neurology

## 2018-05-12 ENCOUNTER — Encounter

## 2018-05-12 ENCOUNTER — Ambulatory Visit: Payer: Medicare Other | Admitting: Neurology

## 2018-05-12 VITALS — BP 139/83 | HR 75 | Resp 20 | Ht 68.5 in | Wt 210.0 lb

## 2018-05-12 DIAGNOSIS — H02403 Unspecified ptosis of bilateral eyelids: Secondary | ICD-10-CM

## 2018-05-12 DIAGNOSIS — I2510 Atherosclerotic heart disease of native coronary artery without angina pectoris: Secondary | ICD-10-CM | POA: Diagnosis not present

## 2018-05-12 DIAGNOSIS — M21372 Foot drop, left foot: Secondary | ICD-10-CM

## 2018-05-12 DIAGNOSIS — G7000 Myasthenia gravis without (acute) exacerbation: Secondary | ICD-10-CM | POA: Diagnosis not present

## 2018-05-12 DIAGNOSIS — R7303 Prediabetes: Secondary | ICD-10-CM | POA: Diagnosis not present

## 2018-05-12 DIAGNOSIS — G5732 Lesion of lateral popliteal nerve, left lower limb: Secondary | ICD-10-CM | POA: Diagnosis not present

## 2018-05-12 DIAGNOSIS — G4733 Obstructive sleep apnea (adult) (pediatric): Secondary | ICD-10-CM | POA: Diagnosis not present

## 2018-05-12 MED ORDER — TRAZODONE HCL 50 MG PO TABS: ORAL_TABLET | ORAL | 5 refills | Status: DC

## 2018-05-12 NOTE — Patient Instructions (Signed)
1.   Alternate prednisone 40 mg with 20 mg daily.   After 6 weeks can drop to one pill a day. 2.   Continue Mestinon 4 or 5 pills a day 3.   Trazodone 1/2 pill to 2 pills nightly for sleep

## 2018-05-12 NOTE — Progress Notes (Signed)
[facility]  PATIENT: David [name] DOB: 11-May-1936  REFERRING DOCTOR OR PCP:  [name] (PCP) and Rutherford Guys (Ophtho) SOURCE: Patient, records from Dr. Shelia Media, Lab results, CPAP download  _________________________________   HISTORICAL  CHIEF COMPLAINT:  Chief Complaint  Patient presents with  . Myasthenia Gravis    Sts. he is taking Mestinon 60mg  5 times daily. and CellCept 500mg  bid, and Prednisone 40mg  daily.  Never had IVIG because it interfered with work schedule, and sts. he feels better now, so not sure he needs it.  Sts. he has started using his CPAP machine but is having a hard time sleeping with it, b/c he's a side sleeper and machine cuts off if he doesn't lie on his back. Has seen podiatry for foot pain and sts. braces have been ordered for both feet/fim  . Sleep Apnea    HISTORY OF PRESENT ILLNESS:  David [name]  is a 82 y.o. man with just left on LV done about 3 minutes okay myasthenia and OSA.     Update 05/12/2018: His myasthenia gravis is doing better.   He notes his MG improved on combination of higher mestinon and prednisone 40 mg and Cellcept 500 mg bid.  He tolerates the medications well.  The eyelid drooping is much better though it still bothers him and he feels his visual fields are worsened.  He has foot pain bilaterally and note weakness on the left.  This started shortly after his epidural.  However, the more severe back and radiating leg pain has improved.  The right was also weak but improved.   He notes a foot drop with his walking.  He has OSA but has trouble using it due to sleeping on his side.   He falls asleep easily.   He often wakes up 2 hours later and then is up/down the rest of the night.   He feels better nights he does not use it.   His wife notes when he wears it there is no snoring but he snores very loud when he uses it.   I checked a download.  He use the CPAP for 12 the last 13 nights between 30 minutes and 2  hours.  Update 03/26/2018: He is having worsening muscle weakness despite prednisone and mestinon.   He notes his strength is better early in the morning but by mid morning he has more ptosis and his neck becomes weaker.  He takes mestinon 60 mg po qid.   He is taking prednisone 40 mg po qd as well.     He notes neck weakness is still bad after mid morning.  His daughter is a Astronomer and notes that he has trouble eating a meal unless soft.   He notes he tires out easily with meals.    He does best with apple sauce consistency.   With soft food, he does not appear to be aspirating.  He has trouble falling asleep and staying asleep.    He has OSA but has trouble using CPAP.   He had a Tax adviser in [place].   He will bring by the reports as I could not find anything in the EMR.    He has left-sided lumbar radiculopathy (he has L3-L4 spinal stenosis and severe bilateral foraminal narrowing at L5-S1 with pars defects and anterolisthesis).   He had some benefit from an epidural steroid injection.    Update 02/23/2018: He reports having issues with redness in his eyes, R >  L, and was prescribed eye drops.  While this was happening, he had more eyelid drooping.   He had more drooping after the drops than before the drops.   He saw his ophthalmologist (Dr. Gershon Crane) and then was placed on Tobramycin drops.   The infection has seemd cleared up the last 24 hours.    He is on pyridostigmine 60 mg po tid.   He toleratre  He reports pian in the left > right hamstrings and into the calf.   Sometimes there is just calf pain.   I took another look at his lumbar spine CT myelogram from earlier this year.  It does show multilevel degenerative changes with spinal stenosis at L3-L4 and significant anterolisthesis associated with pars defects at L5-S1 causing severe bilateral neuroforaminal stenosis at that level.  Either of these findings could be related to his symptoms.  He is not interested in surgery at this  time and we discussed some other options.  IMPRESSION: 1. Diffuse lumbar disc and facet degeneration. 2. Multifactorial spinal stenosis at L3-4, severe with standing. Moderate bilateral neural foraminal stenosis. 3. L5 pars defects with grade 1/2 anterolisthesis which slightly increases with standing. Severe bilateral neural foraminal stenosis. 4. Mild-to-moderate left lateral recess stenosis at L2-3.   Update 11/06/2017: He is on Mestinon for his MG and he tolerates it well.   He is on only 30 mg 2-3 and symptoms improved.     He had a possible abscess on the left near his left ear and was prescribed clindamycin with benefit.   He then had a similar issue on the right with swelling so was placed on it again, with less benefit.  Due to continued pain, he was prescribed cyclobenzaprine 10/28/17 and felt weaker in his legs.   He only took one pill a day x 5 days      He has OSA but has not been using his CPAP.  We discussed trying to use it nightly.     Update 05/12/2017:     He feels the MG is doing well.   He takes mestinon 1/2 pill bid and tolerates it well.   He denies diplopia. No proximal weakness.  He has OSA and a new CPAP machine was ordered with a different mask.    He hasn't set it up as the bedroom was moved around (wife had knee surgery).  He snores but does not wake up.   He sleeps 5-6 hours sometimes wakingup once to urinate.   He rarely dozes off unintentionally, only if tired.      _________________________________________ From 12/31/2016  MG:    His ptosis improved with Mestinon. He had trouble tolerating higher dosages but notes continued clinical benefit benefit at a relatively low dose of 30 mg 2-3 times a day (usually takes twice a day but will take a third pill if he could be out of the house).    Higher dose caused diarrhea.    He denies any weakness in the shoulders or hips. He is able to stand up from a chair multiple times without using his arms he can stand up from a  squatting position.    Vallecular mass?:   We had checked CT scan of the chest and neck to rule out thymoma.   None was found.   However, there was asymmetry with the possible soft tissue mass involving the right vallecular.      He is not a smoker or drinker or heavy drinker.  He saw Dr. Benjamine Mola of ENT and had a scope and nothing   OSA;  He has OSA ans is on CPAP.    PSG 01/07/99 showed an RDI = 99 and PSG 2004 showed RDI = 78.   He has a new machine since 2013 (Resmed).    He stopped CPAP 2 years ago because of comfort issues.     He occasionally nods off due to EDS but usually does not.      MG History:   He had the onset of ptosis, while traveling in [place] and [place], about 3 weeks ago. He first noted the ptosis while trying to watch a movie in the bus (screen was up high) and noting that the eyelids were covering his vision.  He did note the trip was stressful and he and his wife both had injuries (he sprained ankle and wife broke shoulder).   Lab work was performed and the acetylcholine binding antibodies were elevated at 1.85 (less than 0.24 normal). 57% were blocking antibodies and 25% or modulating antibodies.   Other lab work was noncontributory.     CT scan of the head showed age-related mild chronic microvascular ischemic change.      REVIEW OF SYSTEMS: Constitutional: No fevers, chills, sweats, or change in appetite Eyes: No visual changes, double vision, eye pain.   He notes some ptosis which reduces his visual fields superiorly Ear, nose and throat: No hearing loss, ear pain, nasal congestion, sore throat Cardiovascular: No chest pain, palpitations Respiratory: No shortness of breath at rest or with exertion.   No wheezes.  He has OSA and is having trouble using CPAP. GastrointestinaI: No nausea, vomiting, diarrhea, abdominal pain, fecal incontinence Genitourinary: No dysuria, urinary retention or frequency.  No nocturia. Musculoskeletal: No neck pain, back pain Integumentary:  No rashes. Neurological: as above Psychiatric: No depression at this time.  No anxiety Endocrine: No palpitations, diaphoresis, change in appetite, change in weigh or increased thirst Hematologic/Lymphatic: No anemia, purpura, petechiae. Allergic/Immunologic: No itchy/runny eyes, nasal congestion, recent allergic reactions, rashes  ALLERGIES: Allergies  Allergen Reactions  . Gris-Peg [Griseofulvin] Other (See Comments)    headaches  . Terbinafine Other (See Comments)    headache    HOME MEDICATIONS:  Current Outpatient Medications:  .  alfuzosin (UROXATRAL) 10 MG 24 hr tablet, , Disp: , Rfl:  .  aspirin EC 81 MG tablet, Take 81 mg by mouth daily. , Disp: , Rfl:  .  ezetimibe-simvastatin (VYTORIN) 10-40 MG per tablet, Take 1 tablet by mouth at bedtime., Disp: , Rfl:  .  fish oil-omega-3 fatty acids 1000 MG capsule, Take 1 g by mouth daily. , Disp: , Rfl:  .  glucosamine-chondroitin 500-400 MG tablet, Take 1 tablet by mouth daily. , Disp: , Rfl:  .  loratadine (CLARITIN) 10 MG tablet, Take 10 mg by mouth daily., Disp: , Rfl:  .  Multiple Vitamins-Minerals (MULTIVITAMIN WITH MINERALS) tablet, Take 1 tablet by mouth daily., Disp: , Rfl:  .  mycophenolate (CELLCEPT) 250 MG capsule, Take one pill po twice a day for two weeks, then increase to 2 pills po twice a day, Disp: 120 capsule, Rfl: 5 .  neomycin-polymyxin-hydrocortisone (CORTISPORIN) OTIC solution, Apply 2 drops to the ingrown toenail site twice daily. Cover with band-aid., Disp: 10 mL, Rfl: 0 .  nitroGLYCERIN (NITROSTAT) 0.4 MG SL tablet, Place 1 tablet (0.4 mg total) under the tongue every 5 (five) minutes as needed for chest pain. Reported on 11/01/2015, Disp: 25 tablet, Rfl:  1 .  predniSONE (DELTASONE) 20 MG tablet, Take 2 tablets (40 mg total) by mouth daily., Disp: 180 tablet, Rfl: 0 .  pyridostigmine (MESTINON) 60 MG tablet, Take 1 tablet (60 mg total) by mouth 4 (four) times daily. 1/2 to 1 pill po three times a day (Patient  taking differently: Take 60 mg by mouth 5 (five) times daily. 1/2 to 1 pill po three times a day), Disp: 360 tablet, Rfl: 3 .  tamsulosin (FLOMAX) 0.4 MG CAPS capsule, Take 0.4 mg by mouth daily. Reported on 11/01/2015, Disp: , Rfl:  .  triamterene-hydrochlorothiazide (MAXZIDE-25) 37.5-25 MG per tablet, Take 1 tablet by mouth daily., Disp: , Rfl:  .  vitamin C (ASCORBIC ACID) 250 MG tablet, Take 250 mg by mouth daily., Disp: , Rfl:  .  traZODone (DESYREL) 50 MG tablet, Take 1/2 to 1 po qHS, Disp: 30 tablet, Rfl: 5  PAST MEDICAL HISTORY: Past Medical History:  Diagnosis Date  . CAD (coronary artery disease)    previous stent to LAD 2003  . Cancer ([place] [place])    skin  . Hyperlipidemia   . Hypertension   . LBBB (left bundle branch block)   . Myasthenia gravis ([place])   . S/P cardiac pacemaker procedure, placement 10/24/14 Medtronic Adapta L model ADDRL 1  10/25/2014    PAST SURGICAL HISTORY: Past Surgical History:  Procedure Laterality Date  . CORONARY ANGIOPLASTY WITH STENT PLACEMENT  03/01/2002   stenting to LAD - Dr. Glade Lloyd  . NM MYOCAR PERF WALL MOTION  11/27/2010   normal  . PERMANENT PACEMAKER INSERTION N/A 10/24/2014   Procedure: PERMANENT PACEMAKER INSERTION;  Surgeon: [name], MD;  Location: [facility] CATH LAB;  Service: Cardiovascular;  Laterality: N/A;  . TONSILLECTOMY    . US ECHOCARDIOGRAPHY  12/05/2010   proximal septal thickening,mild concentric LVH,mild deptal hypokinesis,RV mildly dilated,LA mildly dilated,mild AI,moderate aortic root dilatation,septal motion c/w conduction abnormality    FAMILY HISTORY: Family History  Problem Relation Age of Onset  . Breast cancer Mother   . Colon cancer Father   . Lung cancer Father     SOCIAL HISTORY:  Social History   Socioeconomic History  . Marital status: Married    Spouse name: Not on file  . Number of children: Not on file  . Years of education: Not on file  . Highest education level: Not on file  Occupational History  . Not  on file  Social Needs  . Financial resource strain: Not on file  . Food insecurity:    Worry: Not on file    Inability: Not on file  . Transportation needs:    Medical: Not on file    Non-medical: Not on file  Tobacco Use  . Smoking status: Former Smoker    Types: Cigarettes, Cigars    Start date: 04/28/1949    Last attempt to quit: 10/27/1982    Years since quitting: 35.5  . Smokeless tobacco: Never Used  Substance and Sexual Activity  . Alcohol use: Yes    Comment: 1-2 drink per week  . Drug use: No  . Sexual activity: Not on file  Lifestyle  . Physical activity:    Days per week: Not on file    Minutes per session: Not on file  . Stress: Not on file  Relationships  . Social connections:    Talks on phone: Not on file    Gets together: Not on file    Attends religious service: Not on file    Active  member of club or organization: Not on file    Attends meetings of clubs or organizations: Not on file    Relationship status: Not on file  . Intimate partner violence:    Fear of current or ex partner: Not on file    Emotionally abused: Not on file    Physically abused: Not on file    Forced sexual activity: Not on file  Other Topics Concern  . Not on file  Social History Narrative  . Not on file     PHYSICAL EXAM  Vitals:   05/12/18 0815  BP: 139/83  Pulse: 75  Resp: 20  Weight: 210 lb (95.3 kg)  Height: 5' 8.5" (1.74 m)    Body mass index is 31.47 kg/m.     General: The patient is well-developed and well-nourished and in no acute distress   Neurologic Exam  Mental status: The patient is alert and oriented x 3 at the time of the examination. The patient has apparent normal recent and remote memory, with an apparently normal attention span and concentration ability.   Speech is normal.  Cranial nerves: Extraocular muscles show a reduced upgaze on the right.  There is ptosis much worse on the right than the left.  He has reduced neck extension strength.   Voice was strong.. Trapezius is strong.  The tongue is midline, and the patient has symmetric elevation of the soft palate. No obvious hearing deficits are noted.  Motor:  Muscle bulk is normal.   Tone is normal. Strength is  5 / 5 in the arms shoulders and neck.  Strength is 2-/5 in the peroneal innervated foot and ankle muscles on the left and 5/5 elsewhere in the left leg.  EHL is 4+/5 on the right and he is 5/5 elsewhere on the right.  He can stand from a seated position multiple times without using his arms.  Sensory: He has reduced sensation in the left foot.  Coordination: Cerebellar testing reveals good finger-nose-finger and heel-to-shin bilaterally.  Gait and station: Station is normal.  He has a reduced stride and and arthritic gait.  He cannot do a tandem walk. Romberg is negative.   Reflexes: Deep tendon reflexes are symmetric and normal in the arms and knees but absent at the ankles    DIAGNOSTIC DATA (LABS, IMAGING, TESTING) - I reviewed patient records, labs, notes, testing and imaging myself where available.  Lab Results  Component Value Date   WBC 6.1 04/21/2016   HGB 14.3 04/21/2016   HCT 42.0 04/21/2016   MCV 93.9 04/21/2016   PLT 228 04/21/2016      Component Value Date/Time   NA 141 01/02/2017 0920   K 3.8 01/02/2017 0920   CL 101 01/02/2017 0920   CO2 26 01/02/2017 0920   GLUCOSE 131 (H) 01/02/2017 0920   GLUCOSE 99 04/21/2016 1651   BUN 19 01/02/2017 0920   CREATININE 0.84 01/02/2017 0920   CALCIUM 9.0 01/02/2017 0920   PROT 6.3 (L) 04/21/2016 1630   ALBUMIN 3.9 04/21/2016 1630   AST 25 04/21/2016 1630   ALT 21 04/21/2016 1630   ALKPHOS 60 04/21/2016 1630   BILITOT 1.2 04/21/2016 1630   GFRNONAA 83 01/02/2017 0920   GFRAA 96 01/02/2017 0920   No results found for: CHOL, HDL, LDLCALC, LDLDIRECT, TRIG, CHOLHDL Lab Results  Component Value Date   HGBA1C 6.6 (H) 10/24/2014   No results found for: UMPNTIRW43 Lab Results  Component Value Date    TSH 1.402 10/24/2014  ASSESSMENT AND PLAN  Myasthenia gravis ([place])  Ptosis of both upper eyelids - Plan: Ambulatory referral to Plastic Surgery  OSA (obstructive sleep apnea)  Peroneal neuropathy, left  Ptosis of both eyelids  Left foot drop     1.. His myasthenia gravis is doing better again, probably combination of the medical changes.  Now that the CellCept should be kicking in more, we will see if we can reduce the prednisone.  He is currently on 40 mg daily and will drop this to 20 mg alternating with 40 mg for 6 weeks and then drop the dose to 20 mg daily.  At the next visit we will try to reduce the prednisone further if possible.    2.   We can cancel the IVIG as he is doing better.   3.   He is using CPAP some in his wife notes no snoring when he uses it.  However, he wakes up a couple hours later and takes the mask off.  I will have him try trazodone at night to see if we can get him to sleep better and use the CPAP more.   4.  He appears to have a peroneal neuropathy on the left, etiology uncertain.  Hopefully he will note more improvement over the next few months.  He has ordered a brace.   I cannot rule out that this is an L5 radiculopathy and if symptoms do not improve we will check an EMG/NCV. 5.    Due to mild persistent bilateral symmetric ptosis, he would like to consider plastic surgery.  We can have him see a Psychologist, sport and exercise.  I do believe the myasthenia is well controlled at this point so residual ptosis might be improved. 6.    He will return to see me in 5 months but call sooner if he has new or worsening neurologic symptoms.  45 minutes face-to-face evaluation with greater than one half the time counseling and coordinating care about his myasthenia gravis, new peroneal neuropathy, continue ptosis and difficulty using CPAP.   Yari Szeliga A. Felecia Shelling, MD, PhD [id number], 00:45 AM Certified in Neurology, Clinical Neurophysiology, Sleep Medicine, Pain Medicine and  Neuroimaging  [facility] [address], [place], [place] [address] [phone]

## 2018-05-13 ENCOUNTER — Telehealth: Payer: Self-pay | Admitting: Neurology

## 2018-05-13 NOTE — Telephone Encounter (Signed)
Pt called stating he was recently prescribed glimepiride by another doctor, would like to make sure this will not cause any s/e with his current medications

## 2018-05-14 ENCOUNTER — Ambulatory Visit (INDEPENDENT_AMBULATORY_CARE_PROVIDER_SITE_OTHER): Payer: Medicare Other

## 2018-05-14 DIAGNOSIS — L600 Ingrowing nail: Secondary | ICD-10-CM

## 2018-05-14 NOTE — Patient Instructions (Signed)

## 2018-05-19 NOTE — Telephone Encounter (Signed)
I believe Glimepride has no interaction with myasthenia drugs and should be ok

## 2018-05-19 NOTE — Telephone Encounter (Signed)
Dr. Leonie Man- are you able to review this? Dr. Felecia Shelling out of the office today. You are the WID this am.   He wants to know if it is ok for him to take glimepiride with his other current medications

## 2018-05-19 NOTE — Telephone Encounter (Signed)
Pt has called back stating he has not received a response to this message sent last week.  Please call

## 2018-05-20 NOTE — Telephone Encounter (Signed)
I called pt and relayed message below. He verbalized understanding and appreciation for call.

## 2018-05-21 NOTE — Progress Notes (Signed)
Patient is here today for follow-up appointment, removal of left hallux nail, and fifth toe nail on the left foot.  Procedure was performed on 04/30/2018.  He states today that he is not having any issues, he is not having any pain or tenderness to the area.  No erythema, no redness, no swelling, no drainage, no other signs and symptoms of infection.  Area appears to be healing over well and is scabbing at this time.  Discussed signs and symptoms of infection with patient.  Verbal and written instructions were dispensed.  He is to follow-up with any acute symptom changes.

## 2018-05-26 ENCOUNTER — Telehealth: Payer: Self-pay | Admitting: Podiatry

## 2018-05-26 DIAGNOSIS — R7303 Prediabetes: Secondary | ICD-10-CM | POA: Diagnosis not present

## 2018-05-26 DIAGNOSIS — I2510 Atherosclerotic heart disease of native coronary artery without angina pectoris: Secondary | ICD-10-CM | POA: Diagnosis not present

## 2018-05-26 DIAGNOSIS — G7000 Myasthenia gravis without (acute) exacerbation: Secondary | ICD-10-CM | POA: Diagnosis not present

## 2018-05-26 DIAGNOSIS — G4733 Obstructive sleep apnea (adult) (pediatric): Secondary | ICD-10-CM | POA: Diagnosis not present

## 2018-05-26 NOTE — Telephone Encounter (Signed)
My toenail looks like pus is starting to come out of the sides. I've already finished the soaks and just wanted what I should do. I can be reached at [phone].

## 2018-05-26 NOTE — Telephone Encounter (Signed)
I called asked pt to describe the toe and what was happening with it. Pt states the toe looks fine, but the is some oozing and has performed the soaks twice daily up until last then daily and stopped the drops around Saturday. I offered pt an appt with clinical nurse to evaluate and told him to continue the soaks and drops until seen. Transferred to schedulers.

## 2018-05-27 ENCOUNTER — Ambulatory Visit (INDEPENDENT_AMBULATORY_CARE_PROVIDER_SITE_OTHER): Payer: Self-pay | Admitting: Podiatry

## 2018-05-27 ENCOUNTER — Other Ambulatory Visit: Payer: Medicare Other | Admitting: Orthotics

## 2018-05-27 DIAGNOSIS — L600 Ingrowing nail: Secondary | ICD-10-CM

## 2018-05-28 ENCOUNTER — Ambulatory Visit (INDEPENDENT_AMBULATORY_CARE_PROVIDER_SITE_OTHER): Payer: Medicare Other | Admitting: Orthotics

## 2018-05-28 DIAGNOSIS — M21372 Foot drop, left foot: Secondary | ICD-10-CM | POA: Diagnosis not present

## 2018-05-28 DIAGNOSIS — R2689 Other abnormalities of gait and mobility: Secondary | ICD-10-CM | POA: Diagnosis not present

## 2018-05-28 DIAGNOSIS — R269 Unspecified abnormalities of gait and mobility: Secondary | ICD-10-CM

## 2018-05-28 DIAGNOSIS — M21371 Foot drop, right foot: Secondary | ICD-10-CM | POA: Diagnosis not present

## 2018-05-28 DIAGNOSIS — Z9181 History of falling: Secondary | ICD-10-CM

## 2018-05-28 NOTE — Progress Notes (Signed)
Patient is here today for follow-up appointment, removal of left hallux nail, and fifth toe nail on the left foot.  Procedure was performed on 04/30/2018.  He states today that he is not having any pain, but is concerned with the continued drainage coming from the toe.  He states that he is also stopped soaking the toe and stopped applying antibiotic drops.   No erythema, no redness, no swelling, serosanguineous drainage, no other signs and symptoms of infection.  Area appears to be healing well.  Discussed signs and symptoms of infection with patient.  Verbal and written instructions were dispensed.  I did instruct patient to continue soaking until the area scabs over.  He is to follow-up with any acute symptom changes such as increased pain, redness, and increased drainage.  Patient was also seen and evaluated by Dr. Paulla Dolly.

## 2018-05-28 NOTE — Progress Notes (Signed)
Patient came in today to pick up Moore Balance Brace (Bilateral).   Patient was able to don brace independently and brace was check for fit to custom.   Patient was observed walking with brace and gait was improved as well as stability.   Patient was advised of care and wearing instructions.  Advised to notify practice if there were any issues; especially skin irritation.  

## 2018-06-04 ENCOUNTER — Other Ambulatory Visit: Payer: Self-pay | Admitting: Podiatry

## 2018-06-15 ENCOUNTER — Encounter: Payer: Self-pay | Admitting: Plastic Surgery

## 2018-06-15 ENCOUNTER — Ambulatory Visit: Payer: Medicare Other | Admitting: Plastic Surgery

## 2018-06-15 VITALS — BP 132/70 | HR 89 | Ht 69.0 in | Wt 210.0 lb

## 2018-06-15 DIAGNOSIS — H02831 Dermatochalasis of right upper eyelid: Secondary | ICD-10-CM | POA: Insufficient documentation

## 2018-06-15 DIAGNOSIS — H02834 Dermatochalasis of left upper eyelid: Secondary | ICD-10-CM

## 2018-06-15 NOTE — Progress Notes (Signed)
Patient ID: David [name], male    DOB: 20-Jul-1936, 82 y.o.   MRN: [id number]   Chief Complaint  Patient presents with  . Skin Problem    The patient is an 82 year old white male here with his wife for evaluation of his upper lids.  About 3 years ago the patient was diagnosed with myasthenia gravis upon arrival back to the [place] from overseas.  He was driving home from [place] when all of a sudden he realized he could not see.  He is doing extremely well now with his medication regiment.  He was recently diagnosed as prediabetic and checks his blood sugar daily.  He has not been driving due to what he describes as a visual field defect where he has to hold his eyelids up to see before field.  He is still working and would like to get back to driving as well.  On exam he does not seem to have lid lag.  He has dermatochalasis with his upper lid skin resting on his lashes.  He also has some herniation of his fat accentuating the fullness.  He has herniation of the fat on the lower lids as well but at this time is not interested in surgery on the lower lids he has a regular eye doctor and his vision is stable.  The field is significantly worse as the day goes on and difficult to even watch TV at night.  Holding his lids open does help but restricts him from using his hands at the same time   Review of Systems  Constitutional: Positive for activity change. Negative for appetite change.  HENT: Negative.   Eyes: Negative.   Respiratory: Negative.   Cardiovascular: Negative.   Gastrointestinal: Negative.  Negative for abdominal distention and abdominal pain.  Genitourinary: Negative.   Musculoskeletal: Negative.   Skin: Negative for color change and wound.  Psychiatric/Behavioral: Negative.     Past Medical History:  Diagnosis Date  . CAD (coronary artery disease)    previous stent to LAD 2003  . Cancer ([place])    skin  . Hyperlipidemia   . Hypertension   . LBBB (left bundle branch  block)   . Myasthenia gravis ([place])   . S/P cardiac pacemaker procedure, placement 10/24/14 Medtronic Adapta L model ADDRL 1  10/25/2014    Past Surgical History:  Procedure Laterality Date  . CORONARY ANGIOPLASTY WITH STENT PLACEMENT  03/01/2002   stenting to LAD - Dr. Glade Lloyd  . NM MYOCAR PERF WALL MOTION  11/27/2010   normal  . PERMANENT PACEMAKER INSERTION N/A 10/24/2014   Procedure: PERMANENT PACEMAKER INSERTION;  Surgeon: [name], MD;  Location: [facility] CATH LAB;  Service: Cardiovascular;  Laterality: N/A;  . TONSILLECTOMY    . US ECHOCARDIOGRAPHY  12/05/2010   proximal septal thickening,mild concentric LVH,mild deptal hypokinesis,RV mildly dilated,LA mildly dilated,mild AI,moderate aortic root dilatation,septal motion c/w conduction abnormality      Current Outpatient Medications:  .  alfuzosin (UROXATRAL) 10 MG 24 hr tablet, , Disp: , Rfl:  .  aspirin EC 81 MG tablet, Take 81 mg by mouth daily. , Disp: , Rfl:  .  ezetimibe-simvastatin (VYTORIN) 10-40 MG per tablet, Take 1 tablet by mouth at bedtime., Disp: , Rfl:  .  fish oil-omega-3 fatty acids 1000 MG capsule, Take 1 g by mouth daily. , Disp: , Rfl:  .  glucosamine-chondroitin 500-400 MG tablet, Take 1 tablet by mouth daily. , Disp: , Rfl:  .  loratadine (CLARITIN)  10 MG tablet, Take 10 mg by mouth daily., Disp: , Rfl:  .  Multiple Vitamins-Minerals (MULTIVITAMIN WITH MINERALS) tablet, Take 1 tablet by mouth daily., Disp: , Rfl:  .  mycophenolate (CELLCEPT) 250 MG capsule, Take one pill po twice a day for two weeks, then increase to 2 pills po twice a day, Disp: 120 capsule, Rfl: 5 .  predniSONE (DELTASONE) 20 MG tablet, Take 2 tablets (40 mg total) by mouth daily., Disp: 180 tablet, Rfl: 0 .  pyridostigmine (MESTINON) 60 MG tablet, Take 1 tablet (60 mg total) by mouth 4 (four) times daily. 1/2 to 1 pill po three times a day (Patient taking differently: Take 60 mg by mouth 5 (five) times daily. 1/2 to 1 pill po three times a day), Disp:  360 tablet, Rfl: 3 .  tamsulosin (FLOMAX) 0.4 MG CAPS capsule, Take 0.4 mg by mouth daily. Reported on 11/01/2015, Disp: , Rfl:  .  triamterene-hydrochlorothiazide (MAXZIDE-25) 37.5-25 MG per tablet, Take 1 tablet by mouth daily., Disp: , Rfl:  .  neomycin-polymyxin-hydrocortisone (CORTISPORIN) OTIC solution, INSTILL 2 DROPS INTO THE INGROWN TOENAIL SITE TWICE DAILY. COVER WITH BAND-AID, Disp: 10 mL, Rfl: 0 .  nitroGLYCERIN (NITROSTAT) 0.4 MG SL tablet, Place 1 tablet (0.4 mg total) under the tongue every 5 (five) minutes as needed for chest pain. Reported on 11/01/2015, Disp: 25 tablet, Rfl: 1 .  traZODone (DESYREL) 50 MG tablet, Take 1/2 to 1 po qHS, Disp: 30 tablet, Rfl: 5 .  vitamin C (ASCORBIC ACID) 250 MG tablet, Take 250 mg by mouth daily., Disp: , Rfl:    Objective:   Vitals:   06/15/18 0833  BP: 132/70  Pulse: 89  SpO2: 94%    Physical Exam  Constitutional: He appears well-developed and well-nourished.  HENT:  Head: Normocephalic and atraumatic.  Eyes: Pupils are equal, round, and reactive to light. EOM are normal.  Cardiovascular: Normal rate.  Pulmonary/Chest: Effort normal.  Abdominal: Soft. He exhibits no distension. There is no tenderness.  Skin: Skin is warm.  Psychiatric: He has a normal mood and affect. His behavior is normal. Judgment and thought content normal.    Assessment & Plan:  Dermatochalasis of both upper eyelids  Recommend visual field exam and then upper lid blepharoplasty for dermatochalasis.  As he prepares for surgery I recommend strict blood sugar control, protein supplement, cutting back on carbohydrates and sugar and increasing his vegetable intake.  [place], DO

## 2018-06-16 ENCOUNTER — Telehealth: Payer: Self-pay | Admitting: Neurology

## 2018-06-16 NOTE — Telephone Encounter (Signed)
Pt requesting a call stating he has a few questions regarding medication predniSONE (DELTASONE) 20 MG tablet

## 2018-06-16 NOTE — Telephone Encounter (Signed)
I called patient back. Went over Dr. Garth Bigness instructions per last OV note: "He is currently on 40 mg daily and will drop this to 20 mg alternating with 40 mg for 6 weeks and then drop the dose to 20 mg daily.  At the next visit we will try to reduce the prednisone further if possible. " Pt states his BS readings are going between 200-300 on the 40mg  dose. He will continue to monitor this and f/u with PCP.  He reports bowel issues. Having looser stools. He is not on fiber. He will try metamucil to see if this improve sx. He is going to try probiotic as well. He will call back if he has further questions.

## 2018-06-22 ENCOUNTER — Telehealth: Payer: Self-pay | Admitting: Cardiology

## 2018-06-22 ENCOUNTER — Ambulatory Visit (INDEPENDENT_AMBULATORY_CARE_PROVIDER_SITE_OTHER): Payer: Medicare Other

## 2018-06-22 DIAGNOSIS — I442 Atrioventricular block, complete: Secondary | ICD-10-CM | POA: Diagnosis not present

## 2018-06-22 NOTE — Telephone Encounter (Signed)
Spoke with pt and reminded pt of remote transmission that is due today. Pt verbalized understanding.   

## 2018-06-23 ENCOUNTER — Encounter: Payer: Self-pay | Admitting: Cardiology

## 2018-06-23 NOTE — Progress Notes (Signed)
Remote pacemaker transmission.   

## 2018-07-12 DIAGNOSIS — J019 Acute sinusitis, unspecified: Secondary | ICD-10-CM | POA: Diagnosis not present

## 2018-07-14 DIAGNOSIS — D485 Neoplasm of uncertain behavior of skin: Secondary | ICD-10-CM | POA: Diagnosis not present

## 2018-07-14 DIAGNOSIS — L570 Actinic keratosis: Secondary | ICD-10-CM | POA: Diagnosis not present

## 2018-07-14 DIAGNOSIS — Z85828 Personal history of other malignant neoplasm of skin: Secondary | ICD-10-CM | POA: Diagnosis not present

## 2018-07-14 DIAGNOSIS — C44629 Squamous cell carcinoma of skin of left upper limb, including shoulder: Secondary | ICD-10-CM | POA: Diagnosis not present

## 2018-07-14 DIAGNOSIS — L821 Other seborrheic keratosis: Secondary | ICD-10-CM | POA: Diagnosis not present

## 2018-08-02 DIAGNOSIS — G7000 Myasthenia gravis without (acute) exacerbation: Secondary | ICD-10-CM | POA: Diagnosis not present

## 2018-08-02 DIAGNOSIS — R7303 Prediabetes: Secondary | ICD-10-CM | POA: Diagnosis not present

## 2018-08-02 DIAGNOSIS — I2510 Atherosclerotic heart disease of native coronary artery without angina pectoris: Secondary | ICD-10-CM | POA: Diagnosis not present

## 2018-08-02 DIAGNOSIS — G4733 Obstructive sleep apnea (adult) (pediatric): Secondary | ICD-10-CM | POA: Diagnosis not present

## 2018-08-05 ENCOUNTER — Telehealth: Payer: Self-pay | Admitting: Neurology

## 2018-08-05 MED ORDER — PREDNISONE 10 MG PO TABS: ORAL_TABLET | ORAL | 0 refills | Status: DC

## 2018-08-05 NOTE — Telephone Encounter (Signed)
Pt asking for a call from RN to make her aware he is driving again.  Pt also states he has a question for RN re: his predniSONE (DELTASONE) 20 MG tablet please call

## 2018-08-05 NOTE — Telephone Encounter (Signed)
Called home number and spoke with wife. He is not home and requested I call him back at [phone]

## 2018-08-05 NOTE — Telephone Encounter (Signed)
I called and spoke with pt. His sx have improved and he is back to driving which he is pleased about.   He is now taking prednisone 20mg  daily. He recently saw primary care physician who states blood sugar levels okay but would like them to improve some. He is wondering if he can decrease to 10mg  daily until he sees Dr. Felecia Shelling on 10/13/27 at [facility] I will check with him and call him back.

## 2018-08-05 NOTE — Telephone Encounter (Signed)
Called, LVM for pt relaying message below. Asked him to call back if he has any further questions/concerns.

## 2018-08-05 NOTE — Addendum Note (Signed)
Addended by: Hope Pigeon on: 08/05/2018 02:29 PM   Modules accepted: Orders

## 2018-08-05 NOTE — Telephone Encounter (Signed)
Spoke with Dr. Felecia Shelling- he would like to send in new prescription for prednisone 10mg  tablet. Directions: Take 1.5 tabs daily for one week and then decrease to 1 tablet daily.

## 2018-08-10 ENCOUNTER — Telehealth: Payer: Self-pay | Admitting: Neurology

## 2018-08-11 NOTE — Telephone Encounter (Signed)
error 

## 2018-08-13 LAB — CUP PACEART REMOTE DEVICE CHECK
Battery Impedance: 236 Ohm
Battery Remaining Longevity: 108 mo
Battery Voltage: 2.78 V
Brady Statistic AP VP Percent: 5 %
Brady Statistic AS VP Percent: 93 %
Implantable Lead Implant Date: 20160329
Implantable Lead Location: 753859
Implantable Lead Model: 5076
Implantable Lead Model: 5092
Lead Channel Impedance Value: 470 Ohm
Lead Channel Pacing Threshold Amplitude: 0.625 V
Lead Channel Pacing Threshold Amplitude: 0.625 V
Lead Channel Setting Pacing Amplitude: 2 V
Lead Channel Setting Pacing Amplitude: 2.5 V
Lead Channel Setting Pacing Pulse Width: 0.4 ms
MDC IDC LEAD IMPLANT DT: 20160329
MDC IDC LEAD LOCATION: 753860
MDC IDC MSMT LEADCHNL RA PACING THRESHOLD PULSEWIDTH: 0.4 ms
MDC IDC MSMT LEADCHNL RV IMPEDANCE VALUE: 596 Ohm
MDC IDC MSMT LEADCHNL RV PACING THRESHOLD PULSEWIDTH: 0.4 ms
MDC IDC PG IMPLANT DT: 20160329
MDC IDC SESS DTM: 20191127133902
MDC IDC SET LEADCHNL RV SENSING SENSITIVITY: 2.8 mV
MDC IDC STAT BRADY AP VS PERCENT: 0 %
MDC IDC STAT BRADY AS VS PERCENT: 2 %

## 2018-08-14 ENCOUNTER — Other Ambulatory Visit: Payer: Self-pay | Admitting: Neurology

## 2018-09-10 DIAGNOSIS — H10503 Unspecified blepharoconjunctivitis, bilateral: Secondary | ICD-10-CM | POA: Diagnosis not present

## 2018-09-13 ENCOUNTER — Other Ambulatory Visit: Payer: Self-pay | Admitting: Neurology

## 2018-09-20 ENCOUNTER — Telehealth: Payer: Self-pay | Admitting: Neurology

## 2018-09-20 DIAGNOSIS — M48061 Spinal stenosis, lumbar region without neurogenic claudication: Secondary | ICD-10-CM

## 2018-09-20 DIAGNOSIS — M47817 Spondylosis without myelopathy or radiculopathy, lumbosacral region: Secondary | ICD-10-CM

## 2018-09-20 DIAGNOSIS — M79605 Pain in left leg: Secondary | ICD-10-CM

## 2018-09-20 DIAGNOSIS — M79604 Pain in right leg: Secondary | ICD-10-CM

## 2018-09-20 DIAGNOSIS — M5417 Radiculopathy, lumbosacral region: Secondary | ICD-10-CM

## 2018-09-20 NOTE — Telephone Encounter (Signed)
Called, LVM returning pt call.   Also received this message re pt:  "Pt is asking for a call to discuss Dr Felecia Shelling ordering his spinal tap at [facility].  Please call"

## 2018-09-20 NOTE — Telephone Encounter (Signed)
Appointment Request From: David [name]    With Provider: Britt Bottom, MD [[facility]]    Preferred Date Range: Any    Preferred Times: Any time    Reason for visit: Request an Appointment    Comments:  Please give me a call/ Your phone Number has changed [name] [phone]

## 2018-09-20 NOTE — Telephone Encounter (Signed)
Pt is asking for a call to discuss Dr Felecia Shelling ordering his spinal tap at [facility].  Please call

## 2018-09-21 ENCOUNTER — Ambulatory Visit (INDEPENDENT_AMBULATORY_CARE_PROVIDER_SITE_OTHER): Payer: Medicare Other | Admitting: *Deleted

## 2018-09-21 DIAGNOSIS — I442 Atrioventricular block, complete: Secondary | ICD-10-CM

## 2018-09-21 LAB — CUP PACEART REMOTE DEVICE CHECK
Battery Impedance: 236 Ohm
Battery Remaining Longevity: 108 mo
Battery Voltage: 2.78 V
Brady Statistic AP VP Percent: 3 %
Brady Statistic AP VS Percent: 0 %
Brady Statistic AS VP Percent: 95 %
Brady Statistic AS VS Percent: 1 %
Date Time Interrogation Session: 20200224214005
Implantable Lead Implant Date: 20160329
Implantable Lead Location: 753859
Implantable Lead Location: 753860
Implantable Lead Model: 5076
Implantable Lead Model: 5092
Implantable Pulse Generator Implant Date: 20160329
Lead Channel Impedance Value: 491 Ohm
Lead Channel Impedance Value: 596 Ohm
Lead Channel Pacing Threshold Amplitude: 0.75 V
Lead Channel Pacing Threshold Amplitude: 0.875 V
Lead Channel Pacing Threshold Pulse Width: 0.4 ms
Lead Channel Pacing Threshold Pulse Width: 0.4 ms
Lead Channel Setting Pacing Amplitude: 2 V
Lead Channel Setting Pacing Amplitude: 2.5 V
Lead Channel Setting Pacing Pulse Width: 0.4 ms
Lead Channel Setting Sensing Sensitivity: 2.8 mV
MDC IDC LEAD IMPLANT DT: 20160329

## 2018-09-21 NOTE — Telephone Encounter (Signed)
I called pt and relayed below info. He would like to go to [facility]. I placed order.

## 2018-09-21 NOTE — Telephone Encounter (Signed)
Tried calling ptx2 and got busy signal

## 2018-09-21 NOTE — Addendum Note (Signed)
Addended by: Hope Pigeon on: 09/21/2018 01:32 PM   Modules accepted: Orders

## 2018-09-21 NOTE — Telephone Encounter (Signed)
Spoke with Dr. Octavia Bruckner to reorder ESI right L5-S1. He can have this done prior to f/u on 10/13/18 with Dr. Felecia Shelling

## 2018-09-21 NOTE — Telephone Encounter (Addendum)
I called pt back. He has a f/u with Dr. Felecia Shelling on 10/13/18. He states he is having increased problems with legs again and wanting to have another ESI if possible. He is leaving for [place] on 10/23/18 and would like to have this done prior to leaving. Advised I will discuss with Dr. Felecia Shelling and call him back. He verbalized understanding.

## 2018-09-21 NOTE — Telephone Encounter (Signed)
Pt returned call. Please call as soon as available.

## 2018-09-27 ENCOUNTER — Ambulatory Visit
Admission: RE | Admit: 2018-09-27 | Discharge: 2018-09-27 | Disposition: A | Discharge status: Home / Self Care | Payer: Medicare Other | Source: Ambulatory Visit | Attending: Neurology | Admitting: Neurology

## 2018-09-27 DIAGNOSIS — M48061 Spinal stenosis, lumbar region without neurogenic claudication: Secondary | ICD-10-CM

## 2018-09-27 DIAGNOSIS — M47817 Spondylosis without myelopathy or radiculopathy, lumbosacral region: Secondary | ICD-10-CM | POA: Diagnosis not present

## 2018-09-27 DIAGNOSIS — M5417 Radiculopathy, lumbosacral region: Secondary | ICD-10-CM

## 2018-09-27 DIAGNOSIS — M79604 Pain in right leg: Secondary | ICD-10-CM

## 2018-09-27 DIAGNOSIS — M79605 Pain in left leg: Secondary | ICD-10-CM

## 2018-09-27 MED ORDER — METHYLPREDNISOLONE ACETATE 40 MG/ML INJ SUSP (RADIOLOG
120.0000 mg | Freq: Once | INTRAMUSCULAR | Status: AC
  Administered 2018-09-27: 120 mg via EPIDURAL

## 2018-09-27 MED ORDER — IOPAMIDOL (ISOVUE-M 200) INJECTION 41%
1.0000 mL | Freq: Once | INTRAMUSCULAR | Status: AC
  Administered 2018-09-27: 1 mL via EPIDURAL

## 2018-09-27 NOTE — Discharge Instructions (Signed)

## 2018-09-28 ENCOUNTER — Encounter: Payer: Self-pay | Admitting: Cardiology

## 2018-09-28 NOTE — Progress Notes (Signed)
Remote pacemaker transmission.   

## 2018-10-06 ENCOUNTER — Other Ambulatory Visit: Payer: Medicare Other

## 2018-10-11 ENCOUNTER — Telehealth: Payer: Self-pay | Admitting: *Deleted

## 2018-10-11 NOTE — Telephone Encounter (Signed)
-----   Message from Britt Bottom, MD sent at 10/11/2018  3:24 PM EDT ----- Regarding: appt David [name], Please call Everardo Beals (Wed appt).   He is in his 38's on immunosuppressants for myasthenia--- he should reschedule if he is feeling stable to avoid him being exposed to other people  Delfino Lovett

## 2018-10-11 NOTE — Telephone Encounter (Signed)
I called pt to offer to r/s but he wanted to keep appt. He will call back if he changes his mind.

## 2018-10-13 ENCOUNTER — Other Ambulatory Visit: Payer: Self-pay

## 2018-10-13 ENCOUNTER — Encounter: Payer: Self-pay | Admitting: Neurology

## 2018-10-13 ENCOUNTER — Ambulatory Visit: Payer: Medicare Other | Admitting: Neurology

## 2018-10-13 VITALS — BP 145/74 | HR 68 | Ht 69.0 in | Wt 225.0 lb

## 2018-10-13 DIAGNOSIS — G7000 Myasthenia gravis without (acute) exacerbation: Secondary | ICD-10-CM | POA: Diagnosis not present

## 2018-10-13 DIAGNOSIS — H02403 Unspecified ptosis of bilateral eyelids: Secondary | ICD-10-CM

## 2018-10-13 DIAGNOSIS — G4733 Obstructive sleep apnea (adult) (pediatric): Secondary | ICD-10-CM

## 2018-10-13 DIAGNOSIS — M48061 Spinal stenosis, lumbar region without neurogenic claudication: Secondary | ICD-10-CM

## 2018-10-13 DIAGNOSIS — H10503 Unspecified blepharoconjunctivitis, bilateral: Secondary | ICD-10-CM | POA: Diagnosis not present

## 2018-10-13 DIAGNOSIS — Z79899 Other long term (current) drug therapy: Secondary | ICD-10-CM

## 2018-10-13 MED ORDER — PREDNISONE 5 MG PO TABS: ORAL_TABLET | ORAL | 3 refills | Status: DC

## 2018-10-13 MED ORDER — PYRIDOSTIGMINE BROMIDE 60 MG PO TABS: ORAL_TABLET | ORAL | 11 refills | Status: DC

## 2018-10-13 MED ORDER — MYCOPHENOLATE MOFETIL 250 MG PO CAPS: ORAL_CAPSULE | ORAL | 5 refills | Status: DC

## 2018-10-13 NOTE — Patient Instructions (Signed)
Coronavirus (COVID-19) Are you at risk?  Are you at risk for the Coronavirus (COVID-19)?  To be considered HIGH RISK for Coronavirus (COVID-19), you have to meet the following criteria:  . Traveled to [place], [place], [place], [place] or [place]; or in the [place] to [place], [place], [place], or [place]; and have fever, cough, and shortness of breath within the last 2 weeks of travel OR . Been in close contact with a person diagnosed with COVID-19 within the last 2 weeks and have fever, cough, and shortness of breath . IF YOU DO NOT MEET THESE CRITERIA, YOU ARE CONSIDERED LOW RISK FOR COVID-19.  What to do if you are HIGH RISK for COVID-19?  . If you are having a medical emergency, call 911. . Seek medical care right away. Before you go to a doctor's office, urgent care or emergency department, call ahead and tell them about your recent travel, contact with someone diagnosed with COVID-19, and your symptoms. You should receive instructions from your physician's office regarding next steps of care.  . When you arrive at healthcare provider, tell the healthcare staff immediately you have returned from visiting [place], [place], [place], [place] or [place]; or traveled in the [place] to [place], [place], [place], or [place]; in the last two weeks or you have been in close contact with a person diagnosed with COVID-19 in the last 2 weeks.   . Tell the health care staff about your symptoms: fever, cough and shortness of breath. . After you have been seen by a medical provider, you will be either: o Tested for (COVID-19) and discharged home on quarantine except to seek medical care if symptoms worsen, and asked to  - Stay home and avoid contact with others until you get your results (4-5 days)  - Avoid travel on public transportation if possible (such as bus, train, or airplane) or o Sent to the Emergency Department by EMS for evaluation, COVID-19 testing, and possible  admission depending on your condition and test results.  What to do if you are LOW RISK for COVID-19?  Reduce your risk of any infection by using the same precautions used for avoiding the common cold or flu:  . Wash your hands often with soap and warm water for at least 20 seconds.  If soap and water are not readily available, use an alcohol-based hand sanitizer with at least 60% alcohol.  . If coughing or sneezing, cover your mouth and nose by coughing or sneezing into the elbow areas of your shirt or coat, into a tissue or into your sleeve (not your hands). . Avoid shaking hands with others and consider head nods or verbal greetings only. . Avoid touching your eyes, nose, or mouth with unwashed hands.  . Avoid close contact with people who are sick. . Avoid places or events with large numbers of people in one location, like concerts or sporting events. . Carefully consider travel plans you have or are making. . If you are planning any travel outside or inside the [place], visit the CDC's Travelers' Health webpage for the latest health notices. . If you have some symptoms but not all symptoms, continue to monitor at home and seek medical attention if your symptoms worsen. . If you are having a medical emergency, call 911.   ADDITIONAL HEALTHCARE OPTIONS FOR PATIENTS  [facility] / e-Visit: [email or web address]         [facility]: [phone]  [facility]: [phone]                   [facility]: [phone]   

## 2018-10-13 NOTE — Progress Notes (Signed)
[facility]  PATIENT: David [name] DOB: October 19, 1935  REFERRING DOCTOR OR PCP:  [name] (PCP) and Rutherford Guys (Ophtho) SOURCE: Patient, records from Dr. Shelia Media, Lab results, CPAP download  _________________________________   HISTORICAL  CHIEF COMPLAINT:  Chief Complaint  Patient presents with   Follow-up    RM 83 with wife. Last seen 05/12/18. Reports incontinence of bladder, snoring/apnea    HISTORY OF PRESENT ILLNESS:  David [name]  is a 83 y.o. man with myasthenia and OSA.     Update 10/13/2018: He feels his myasthenia gravis.   He is on pyridostigmine 60 mg 5 times a day but usually just takes 4 a day.    He is tolerating Cellcept 500 mg po bid and is on prednisone 10 mg daily.   He is putting off blepharoplasty for the time being.   No diplopia.   His legs feel a weak at times.   The left foot drop is better.    He has DJD and back pain and has had 2 ESIs that help the pain and slightly helps the walking.   He has spinal stenosis at L3L4 and possible left L3 nerve root compression and L5 pars defect with significant anterolisthesis and bilateral foraminal stenosis.   He now just takes one Aleve daily.  He has had nocturia (with some incontinence).  Alfuzosin has helped    He stopped using CPAP and we discussed it might help the nocturia.    He notes the cold air is bothersome and the droplets in his tube bothered him with the humidifier.     Update 05/12/2018: His myasthenia gravis is doing better.   He notes his MG improved on combination of higher mestinon and prednisone 40 mg and Cellcept 500 mg bid.  He tolerates the medications well.  The eyelid drooping is much better though it still bothers him and he feels his visual fields are worsened.  He has foot pain bilaterally and note weakness on the left.  This started shortly after his epidural.  However, the more severe back and radiating leg pain has improved.  The right was also weak but  improved.   He notes a foot drop with his walking.  He has OSA but has trouble using it due to sleeping on his side.   He falls asleep easily.   He often wakes up 2 hours later and then is up/down the rest of the night.   He feels better nights he does not use it.   His wife notes when he wears it there is no snoring but he snores very loud when he uses it.   I checked a download.  He use the CPAP for 12 the last 13 nights between 30 minutes and 2 hours.  Update 03/26/2018: He is having worsening muscle weakness despite prednisone and mestinon.   He notes his strength is better early in the morning but by mid morning he has more ptosis and his neck becomes weaker.  He takes mestinon 60 mg po qid.   He is taking prednisone 40 mg po qd as well.     He notes neck weakness is still bad after mid morning.  His daughter is a Astronomer and notes that he has trouble eating a meal unless soft.   He notes he tires out easily with meals.    He does best with apple sauce consistency.   With soft food, he does not appear to be aspirating.  He has trouble falling asleep and staying asleep.    He has OSA but has trouble using CPAP.   He had a Tax adviser in [place].   He will bring by the reports as I could not find anything in the EMR.    He has left-sided lumbar radiculopathy (he has L3-L4 spinal stenosis and severe bilateral foraminal narrowing at L5-S1 with pars defects and anterolisthesis).   He had some benefit from an epidural steroid injection.    Update 02/23/2018: He reports having issues with redness in his eyes, R > L, and was prescribed eye drops.  While this was happening, he had more eyelid drooping.   He had more drooping after the drops than before the drops.   He saw his ophthalmologist (Dr. Gershon Crane) and then was placed on Tobramycin drops.   The infection has seemd cleared up the last 24 hours.    He is on pyridostigmine 60 mg po tid.   He toleratre  He reports pian in the left > right  hamstrings and into the calf.   Sometimes there is just calf pain.   I took another look at his lumbar spine CT myelogram from earlier this year.  It does show multilevel degenerative changes with spinal stenosis at L3-L4 and significant anterolisthesis associated with pars defects at L5-S1 causing severe bilateral neuroforaminal stenosis at that level.  Either of these findings could be related to his symptoms.  He is not interested in surgery at this time and we discussed some other options.  IMPRESSION: 1. Diffuse lumbar disc and facet degeneration. 2. Multifactorial spinal stenosis at L3-4, severe with standing. Moderate bilateral neural foraminal stenosis. 3. L5 pars defects with grade 1/2 anterolisthesis which slightly increases with standing. Severe bilateral neural foraminal stenosis. 4. Mild-to-moderate left lateral recess stenosis at L2-3.   Update 11/06/2017: He is on Mestinon for his MG and he tolerates it well.   He is on only 30 mg 2-3 and symptoms improved.     He had a possible abscess on the left near his left ear and was prescribed clindamycin with benefit.   He then had a similar issue on the right with swelling so was placed on it again, with less benefit.  Due to continued pain, he was prescribed cyclobenzaprine 10/28/17 and felt weaker in his legs.   He only took one pill a day x 5 days      He has OSA but has not been using his CPAP.  We discussed trying to use it nightly.     Update 05/12/2017:     He feels the MG is doing well.   He takes mestinon 1/2 pill bid and tolerates it well.   He denies diplopia. No proximal weakness.  He has OSA and a new CPAP machine was ordered with a different mask.    He hasn't set it up as the bedroom was moved around (wife had knee surgery).  He snores but does not wake up.   He sleeps 5-6 hours sometimes wakingup once to urinate.   He rarely dozes off unintentionally, only if tired.      _________________________________________ From  12/31/2016  MG:    His ptosis improved with Mestinon. He had trouble tolerating higher dosages but notes continued clinical benefit benefit at a relatively low dose of 30 mg 2-3 times a day (usually takes twice a day but will take a third pill if he could be out of the house).    Higher dose  caused diarrhea.    He denies any weakness in the shoulders or hips. He is able to stand up from a chair multiple times without using his arms he can stand up from a squatting position.    Vallecular mass?:   We had checked CT scan of the chest and neck to rule out thymoma.   None was found.   However, there was asymmetry with the possible soft tissue mass involving the right vallecular.      He is not a smoker or drinker or heavy drinker.    He saw Dr. Benjamine Mola of ENT and had a scope and nothing   OSA;  He has OSA ans is on CPAP.    PSG 01/07/99 showed an RDI = 99 and PSG 2004 showed RDI = 78.   He has a new machine since 2013 (Resmed).    He stopped CPAP 2 years ago because of comfort issues.     He occasionally nods off due to EDS but usually does not.      MG History:   He had the onset of ptosis, while traveling in [place] and [place], about 3 weeks ago. He first noted the ptosis while trying to watch a movie in the bus (screen was up high) and noting that the eyelids were covering his vision.  He did note the trip was stressful and he and his wife both had injuries (he sprained ankle and wife broke shoulder).   Lab work was performed and the acetylcholine binding antibodies were elevated at 1.85 (less than 0.24 normal). 57% were blocking antibodies and 25% or modulating antibodies.   Other lab work was noncontributory.     CT scan of the head showed age-related mild chronic microvascular ischemic change.      REVIEW OF SYSTEMS: Constitutional: No fevers, chills, sweats, or change in appetite Eyes: No visual changes, double vision, eye pain.   He notes some ptosis which reduces his visual fields superiorly Ear, nose  and throat: No hearing loss, ear pain, nasal congestion, sore throat Cardiovascular: No chest pain, palpitations Respiratory: No shortness of breath at rest or with exertion.   No wheezes.  He has OSA and is having trouble using CPAP. GastrointestinaI: No nausea, vomiting, diarrhea, abdominal pain, fecal incontinence Genitourinary: No dysuria, urinary retention or frequency.  No nocturia. Musculoskeletal: No neck pain, back pain Integumentary: No rashes. Neurological: as above Psychiatric: No depression at this time.  No anxiety Endocrine: No palpitations, diaphoresis, change in appetite, change in weigh or increased thirst Hematologic/Lymphatic: No anemia, purpura, petechiae. Allergic/Immunologic: No itchy/runny eyes, nasal congestion, recent allergic reactions, rashes  ALLERGIES: Allergies  Allergen Reactions   Gris-Peg [Griseofulvin] Other (See Comments)    headaches   Terbinafine Other (See Comments)    headache    HOME MEDICATIONS:  Current Outpatient Medications:    alfuzosin (UROXATRAL) 10 MG 24 hr tablet, , Disp: , Rfl:    aspirin EC 81 MG tablet, Take 81 mg by mouth daily. , Disp: , Rfl:    Cholecalciferol (VITAMIN D3 PO), Take 1 tablet by mouth daily., Disp: , Rfl:    ezetimibe-simvastatin (VYTORIN) 10-40 MG per tablet, Take 1 tablet by mouth at bedtime., Disp: , Rfl:    fish oil-omega-3 fatty acids 1000 MG capsule, Take 1 g by mouth daily. , Disp: , Rfl:    glucosamine-chondroitin 500-400 MG tablet, Take 1 tablet by mouth daily. , Disp: , Rfl:    loratadine (CLARITIN) 10 MG tablet, Take 10 mg  by mouth daily., Disp: , Rfl:    Multiple Vitamins-Minerals (MULTIVITAMIN WITH MINERALS) tablet, Take 1 tablet by mouth daily., Disp: , Rfl:    mycophenolate (CELLCEPT) 250 MG capsule, Take 2 pills po twice a day, Disp: 120 capsule, Rfl: 5   nitroGLYCERIN (NITROSTAT) 0.4 MG SL tablet, Place 1 tablet (0.4 mg total) under the tongue every 5 (five) minutes as needed for  chest pain. Reported on 11/01/2015, Disp: 25 tablet, Rfl: 1   pyridostigmine (MESTINON) 60 MG tablet, Take up to 5 pills, Disp: 150 tablet, Rfl: 11   tamsulosin (FLOMAX) 0.4 MG CAPS capsule, Take 0.4 mg by mouth daily. Reported on 11/01/2015, Disp: , Rfl:    traZODone (DESYREL) 50 MG tablet, Take 1/2 to 1 po qHS, Disp: 30 tablet, Rfl: 5   triamterene-hydrochlorothiazide (MAXZIDE-25) 37.5-25 MG per tablet, Take 1 tablet by mouth daily., Disp: , Rfl:    predniSONE (DELTASONE) 5 MG tablet, Take 1 pill daily or as directed., Disp: 100 tablet, Rfl: 3  PAST MEDICAL HISTORY: Past Medical History:  Diagnosis Date   CAD (coronary artery disease)    previous stent to LAD 2003   Cancer [facility])    skin   Hyperlipidemia    Hypertension    LBBB (left bundle branch block)    Myasthenia gravis ([place])    S/P cardiac pacemaker procedure, placement 10/24/14 Medtronic Adapta L model ADDRL 1  10/25/2014    PAST SURGICAL HISTORY: Past Surgical History:  Procedure Laterality Date   CORONARY ANGIOPLASTY WITH STENT PLACEMENT  03/01/2002   stenting to LAD - Dr. Glade Lloyd   NM MYOCAR PERF WALL MOTION  11/27/2010   normal   PERMANENT PACEMAKER INSERTION N/A 10/24/2014   Procedure: PERMANENT PACEMAKER INSERTION;  Surgeon: [name], MD;  Location: [facility] CATH LAB;  Service: Cardiovascular;  Laterality: N/A;   TONSILLECTOMY     US ECHOCARDIOGRAPHY  12/05/2010   proximal septal thickening,mild concentric LVH,mild deptal hypokinesis,RV mildly dilated,LA mildly dilated,mild AI,moderate aortic root dilatation,septal motion c/w conduction abnormality    FAMILY HISTORY: Family History  Problem Relation Age of Onset   Breast cancer Mother    Colon cancer Father    Lung cancer Father     SOCIAL HISTORY:  Social History   Socioeconomic History   Marital status: Married    Spouse name: Not on file   Number of children: Not on file   Years of education: Not on file   Highest education level: Not on  file  Occupational History   Not on file  Social Needs   Financial resource strain: Not on file   Food insecurity:    Worry: Not on file    Inability: Not on file   Transportation needs:    Medical: Not on file    Non-medical: Not on file  Tobacco Use   Smoking status: Former Smoker    Types: Cigarettes, Cigars    Start date: 04/28/1949    Last attempt to quit: 10/27/1982    Years since quitting: 35.9   Smokeless tobacco: Never Used  Substance and Sexual Activity   Alcohol use: Yes    Comment: 1-2 drink per week   Drug use: No   Sexual activity: Not on file  Lifestyle   Physical activity:    Days per week: Not on file    Minutes per session: Not on file   Stress: Not on file  Relationships   Social connections:    Talks on phone: Not on file  Gets together: Not on file    Attends religious service: Not on file    Active member of club or organization: Not on file    Attends meetings of clubs or organizations: Not on file    Relationship status: Not on file   Intimate partner violence:    Fear of current or ex partner: Not on file    Emotionally abused: Not on file    Physically abused: Not on file    Forced sexual activity: Not on file  Other Topics Concern   Not on file  Social History Narrative   Not on file     PHYSICAL EXAM  Vitals:   10/13/18 1055 10/13/18 1100  BP:  (!) 145/74  Pulse:  68  Weight: 225 lb (102.1 kg) 225 lb (102.1 kg)  Height:  5\' 9"  (1.753 m)    Body mass index is 33.23 kg/m.     General: The patient is well-developed and well-nourished and in no acute distress   Neurologic Exam  Mental status: The patient is alert and oriented x 3 at the time of the examination. The patient has apparent normal recent and remote memory, with an apparently normal attention span and concentration ability.   Speech is normal.  Cranial nerves: Extraocular muscles were intact.  He only had minimal ptosis today.   He has slightly  reduced neck extension strength.  The voice is strong.. Trapezius is strong.  The tongue is midline, and the patient has symmetric elevation of the soft palate. No obvious hearing deficits are noted.  Motor:  Muscle bulk is normal.   Tone is normal. Strength is  5 / 5 in the arms shoulders and neck.  Strength is 3 to 4-/5 in the L5 innervated foot and ankle muscles on the left and 5/5 elsewhere in the left leg.  EHL is 4+/5 on the right and he is 5/5 elsewhere on the right.  He can stand from a seated position four times without using his arms.  Sensory: He has reduced sensation in the left foot.  Coordination: Cerebellar testing reveals good finger-nose-finger and heel-to-shin bilaterally.  Gait and station: Station is normal.  Stride is reduced and gait is arthritic but he has less foot drop on the left.  He cannot do a tandem walk. Romberg is negative.   Reflexes: Deep tendon reflexes are symmetric and normal in the arms and knees but absent at the ankles    DIAGNOSTIC DATA (LABS, IMAGING, TESTING) - I reviewed patient records, labs, notes, testing and imaging myself where available.  Lab Results  Component Value Date   WBC 6.1 04/21/2016   HGB 14.3 04/21/2016   HCT 42.0 04/21/2016   MCV 93.9 04/21/2016   PLT 228 04/21/2016      Component Value Date/Time   NA 141 01/02/2017 0920   K 3.8 01/02/2017 0920   CL 101 01/02/2017 0920   CO2 26 01/02/2017 0920   GLUCOSE 131 (H) 01/02/2017 0920   GLUCOSE 99 04/21/2016 1651   BUN 19 01/02/2017 0920   CREATININE 0.84 01/02/2017 0920   CALCIUM 9.0 01/02/2017 0920   PROT 6.3 (L) 04/21/2016 1630   ALBUMIN 3.9 04/21/2016 1630   AST 25 04/21/2016 1630   ALT 21 04/21/2016 1630   ALKPHOS 60 04/21/2016 1630   BILITOT 1.2 04/21/2016 1630   GFRNONAA 83 01/02/2017 0920   GFRAA 96 01/02/2017 0920   No results found for: CHOL, HDL, LDLCALC, LDLDIRECT, TRIG, CHOLHDL Lab Results  Component  Value Date   HGBA1C 6.6 (H) 10/24/2014   No results  found for: VITAMINB12 Lab Results  Component Value Date   TSH 1.402 10/24/2014       ASSESSMENT AND PLAN  Myasthenia gravis ([place]) - Plan: CBC with Differential/Platelet, Hepatic function panel  OSA (obstructive sleep apnea)  Degenerative lumbar spinal stenosis  Ptosis of both eyelids  High risk medication use     1.. His myasthenia gravis seems to be doing better now that the CellCept has had time to work.  I will try to lower the prednisone dose to 5 mg daily.  He will transition from 10 mg to 5 mg over a month.  Instructions were provided.   We will check the CBC and LFT. 2.   Prescriptions were refilled. 3.   He is advised to try to use CPAP nightly.  He does not like to use it because the area is too cold without the humidifier and too wet with the humidifier. 4.    He will return to see me in 4 months but call sooner if he has new or worsening neurologic symptoms.    Scotti Kosta A. Felecia Shelling, MD, PhD 7/73/7366, 81:59 PM Certified in Neurology, Clinical Neurophysiology, Sleep Medicine, Pain Medicine and Neuroimaging  [facility] [address] [place],  [address] [phone]

## 2018-10-14 ENCOUNTER — Telehealth: Payer: Self-pay | Admitting: Plastic Surgery

## 2018-10-14 NOTE — Telephone Encounter (Signed)
Patient called to postpone surgery due to COVID-19. He would like to reschedule at a later time.

## 2018-10-22 ENCOUNTER — Encounter: Payer: Medicare Other | Admitting: Plastic Surgery

## 2018-10-29 ENCOUNTER — Other Ambulatory Visit: Payer: Self-pay | Admitting: Neurology

## 2018-11-04 ENCOUNTER — Ambulatory Visit (HOSPITAL_BASED_OUTPATIENT_CLINIC_OR_DEPARTMENT_OTHER): Admit: 2018-11-04 | Payer: Medicare Other | Admitting: Plastic Surgery

## 2018-11-04 ENCOUNTER — Encounter (HOSPITAL_BASED_OUTPATIENT_CLINIC_OR_DEPARTMENT_OTHER): Payer: Self-pay

## 2018-11-04 SURGERY — BLEPHAROPLASTY
Anesthesia: General | Site: Eye | Laterality: Bilateral

## 2018-11-09 DIAGNOSIS — I2510 Atherosclerotic heart disease of native coronary artery without angina pectoris: Secondary | ICD-10-CM | POA: Diagnosis not present

## 2018-11-09 DIAGNOSIS — R7303 Prediabetes: Secondary | ICD-10-CM | POA: Diagnosis not present

## 2018-11-09 DIAGNOSIS — G7000 Myasthenia gravis without (acute) exacerbation: Secondary | ICD-10-CM | POA: Diagnosis not present

## 2018-11-09 DIAGNOSIS — I10 Essential (primary) hypertension: Secondary | ICD-10-CM | POA: Diagnosis not present

## 2018-11-12 ENCOUNTER — Encounter: Payer: Medicare Other | Admitting: Plastic Surgery

## 2018-11-12 DIAGNOSIS — Z0000 Encounter for general adult medical examination without abnormal findings: Secondary | ICD-10-CM | POA: Diagnosis not present

## 2018-11-12 DIAGNOSIS — E7800 Pure hypercholesterolemia, unspecified: Secondary | ICD-10-CM | POA: Diagnosis not present

## 2018-11-12 DIAGNOSIS — E119 Type 2 diabetes mellitus without complications: Secondary | ICD-10-CM | POA: Diagnosis not present

## 2018-11-12 DIAGNOSIS — I10 Essential (primary) hypertension: Secondary | ICD-10-CM | POA: Diagnosis not present

## 2018-11-17 DIAGNOSIS — Z0000 Encounter for general adult medical examination without abnormal findings: Secondary | ICD-10-CM | POA: Diagnosis not present

## 2018-11-17 DIAGNOSIS — G4733 Obstructive sleep apnea (adult) (pediatric): Secondary | ICD-10-CM | POA: Diagnosis not present

## 2018-11-17 DIAGNOSIS — Z1212 Encounter for screening for malignant neoplasm of rectum: Secondary | ICD-10-CM | POA: Diagnosis not present

## 2018-11-17 DIAGNOSIS — I2510 Atherosclerotic heart disease of native coronary artery without angina pectoris: Secondary | ICD-10-CM | POA: Diagnosis not present

## 2018-11-17 DIAGNOSIS — E119 Type 2 diabetes mellitus without complications: Secondary | ICD-10-CM | POA: Diagnosis not present

## 2018-12-12 ENCOUNTER — Other Ambulatory Visit: Payer: Self-pay | Admitting: Neurology

## 2018-12-21 ENCOUNTER — Ambulatory Visit (INDEPENDENT_AMBULATORY_CARE_PROVIDER_SITE_OTHER): Payer: Medicare Other | Admitting: *Deleted

## 2018-12-21 DIAGNOSIS — I4729 Other ventricular tachycardia: Secondary | ICD-10-CM

## 2018-12-21 DIAGNOSIS — I442 Atrioventricular block, complete: Secondary | ICD-10-CM

## 2018-12-21 DIAGNOSIS — I472 Ventricular tachycardia: Secondary | ICD-10-CM

## 2018-12-22 ENCOUNTER — Telehealth: Payer: Self-pay

## 2018-12-22 NOTE — Telephone Encounter (Signed)
Spoke with patient to remind of missed remote transmission 

## 2018-12-23 LAB — CUP PACEART REMOTE DEVICE CHECK
Battery Impedance: 260 Ohm
Battery Remaining Longevity: 105 mo
Battery Voltage: 2.78 V
Brady Statistic AP VP Percent: 3 %
Brady Statistic AP VS Percent: 0 %
Brady Statistic AS VP Percent: 96 %
Brady Statistic AS VS Percent: 1 %
Date Time Interrogation Session: 20200527204432
Implantable Lead Implant Date: 20160329
Implantable Lead Implant Date: 20160329
Implantable Lead Location: 753859
Implantable Lead Location: 753860
Implantable Lead Model: 5076
Implantable Lead Model: 5092
Implantable Pulse Generator Implant Date: 20160329
Lead Channel Impedance Value: 470 Ohm
Lead Channel Impedance Value: 594 Ohm
Lead Channel Pacing Threshold Amplitude: 0.625 V
Lead Channel Pacing Threshold Amplitude: 0.875 V
Lead Channel Pacing Threshold Pulse Width: 0.4 ms
Lead Channel Pacing Threshold Pulse Width: 0.4 ms
Lead Channel Sensing Intrinsic Amplitude: 2.8 mV
Lead Channel Setting Pacing Amplitude: 2 V
Lead Channel Setting Pacing Amplitude: 2.5 V
Lead Channel Setting Pacing Pulse Width: 0.4 ms
Lead Channel Setting Sensing Sensitivity: 2.8 mV

## 2018-12-28 NOTE — Progress Notes (Signed)
Remote pacemaker transmission.   

## 2018-12-30 ENCOUNTER — Telehealth: Payer: Self-pay

## 2018-12-31 NOTE — Telephone Encounter (Signed)
° °  Patient returned call, he does not have a smartphone. Patient apprehensive about e-visit, Requesting TELEPHONE visit .

## 2018-12-31 NOTE — Telephone Encounter (Signed)
Left message regarding appt on 01/03/19. 

## 2018-12-31 NOTE — Telephone Encounter (Signed)
Spoke with pt regarding appt on 01/03/19. Pt stated he is not sure if will be able to do the MyChart video visit, but will try. Pt was advise to check vitals prior to appt. Pt questions and concerns were address.

## 2019-01-03 ENCOUNTER — Telehealth (INDEPENDENT_AMBULATORY_CARE_PROVIDER_SITE_OTHER): Payer: Medicare Other | Admitting: Internal Medicine

## 2019-01-03 DIAGNOSIS — I10 Essential (primary) hypertension: Secondary | ICD-10-CM | POA: Diagnosis not present

## 2019-01-03 DIAGNOSIS — I442 Atrioventricular block, complete: Secondary | ICD-10-CM

## 2019-01-03 DIAGNOSIS — I472 Ventricular tachycardia: Secondary | ICD-10-CM

## 2019-01-03 DIAGNOSIS — N401 Enlarged prostate with lower urinary tract symptoms: Secondary | ICD-10-CM | POA: Diagnosis not present

## 2019-01-03 DIAGNOSIS — I2510 Atherosclerotic heart disease of native coronary artery without angina pectoris: Secondary | ICD-10-CM | POA: Diagnosis not present

## 2019-01-03 DIAGNOSIS — R351 Nocturia: Secondary | ICD-10-CM | POA: Diagnosis not present

## 2019-01-03 NOTE — Progress Notes (Signed)
Electrophysiology TeleHealth Note   Due to national recommendations of social distancing due to [place] 19, an audio  telehealth visit is felt to be most appropriate for this patient at this time.  See MyChart message from today for the patient's consent to telehealth for [facility].  He attempted video visit unsucccesfully.  We therefore did a phone call for this visit today.   Date:  01/03/2019   ID:  David [name], DOB [birth year], MRN [id number]  Location: patient's home  Provider location: [place]  Evaluation Performed: Follow-up visit  PCP:  [name], MD  Cardiologist:  Dr Claiborne Billings Electrophysiologist:  Dr Rayann Heman  Chief Complaint:  HTN  History of Present Illness:    David [name] is a 83 y.o. male who presents via audio conferencing for a telehealth visit today.  Since last being seen in our clinic, the patient reports doing very well.  Today, he denies symptoms of palpitations, chest pain,  lower extremity edema, dizziness, presyncope, or syncope.  His primary concern is with unsteadiness/ balance with walking.  He has SOB with stairs which is stable.  The patient is otherwise without complaint today.  The patient denies symptoms of fevers, chills, cough, or new SOB worrisome for COVID 19.  Past Medical History:  Diagnosis Date  . CAD (coronary artery disease)    previous stent to LAD 2003  . Cancer ([place])    skin  . Hyperlipidemia   . Hypertension   . LBBB (left bundle branch block)   . Myasthenia gravis ([place])   . S/P cardiac pacemaker procedure, placement 10/24/14 Medtronic Adapta L model ADDRL 1  10/25/2014    Past Surgical History:  Procedure Laterality Date  . CORONARY ANGIOPLASTY WITH STENT PLACEMENT  03/01/2002   stenting to LAD - Dr. Glade Lloyd  . NM MYOCAR PERF WALL MOTION  11/27/2010   normal  . PERMANENT PACEMAKER INSERTION N/A 10/24/2014   Procedure: PERMANENT PACEMAKER INSERTION;  Surgeon: [name], MD;  Location: [facility] CATH LAB;  Service:  Cardiovascular;  Laterality: N/A;  . TONSILLECTOMY    . US ECHOCARDIOGRAPHY  12/05/2010   proximal septal thickening,mild concentric LVH,mild deptal hypokinesis,RV mildly dilated,LA mildly dilated,mild AI,moderate aortic root dilatation,septal motion c/w conduction abnormality    Current Outpatient Medications  Medication Sig Dispense Refill  . alfuzosin (UROXATRAL) 10 MG 24 hr tablet     . aspirin EC 81 MG tablet Take 81 mg by mouth daily.     . Cholecalciferol (VITAMIN D3 PO) Take 1 tablet by mouth daily.    Marland Kitchen ezetimibe-simvastatin (VYTORIN) 10-40 MG per tablet Take 1 tablet by mouth at bedtime.    . fish oil-omega-3 fatty acids 1000 MG capsule Take 1 g by mouth daily.     Marland Kitchen glucosamine-chondroitin 500-400 MG tablet Take 1 tablet by mouth daily.     Marland Kitchen loratadine (CLARITIN) 10 MG tablet Take 10 mg by mouth daily.    . Multiple Vitamins-Minerals (MULTIVITAMIN WITH MINERALS) tablet Take 1 tablet by mouth daily.    . mycophenolate (CELLCEPT) 250 MG capsule Take 2 pills po twice a day 120 capsule 5  . nitroGLYCERIN (NITROSTAT) 0.4 MG SL tablet Place 1 tablet (0.4 mg total) under the tongue every 5 (five) minutes as needed for chest pain. Reported on 11/01/2015 25 tablet 1  . predniSONE (DELTASONE) 5 MG tablet Take 1 pill daily or as directed. 100 tablet 3  . pyridostigmine (MESTINON) 60 MG tablet Take up to 5 pills 150 tablet  11  . tamsulosin (FLOMAX) 0.4 MG CAPS capsule Take 0.4 mg by mouth 2 (two) times a day. Reported on 11/01/2015    . traZODone (DESYREL) 50 MG tablet TAKE 1/2 TO 1 TABLET BY MOUTH EVERY NIGHT AT BEDTIME 30 tablet 5  . triamterene-hydrochlorothiazide (MAXZIDE-25) 37.5-25 MG per tablet Take 1 tablet by mouth daily.    Marland Kitchen glimepiride (AMARYL) 4 MG tablet Take 1 tablet by mouth daily.     No current facility-administered medications for this visit.     Allergies:   Gris-peg [griseofulvin] and Terbinafine   Social History:  The patient  reports that he quit smoking about 36 years  ago. His smoking use included cigarettes and cigars. He started smoking about 69 years ago. He has never used smokeless tobacco. He reports current alcohol use. He reports that he does not use drugs.   Family History:  The patient's  family history includes Breast cancer in his mother; Colon cancer in his father; Lung cancer in his father.   ROS:  Please see the history of present illness.   All other systems are personally reviewed and negative.    Exam:    Vital Signs:  There were no vitals taken for this visit.  Well sounding   Labs/Other Tests and Data Reviewed:    Recent Labs: No results found for requested labs within last 8760 hours.   Wt Readings from Last 3 Encounters:  10/13/18 225 lb (102.1 kg)  06/15/18 210 lb (95.3 kg)  05/12/18 210 lb (95.3 kg)     Other studies personally reviewed: Additional studies/ records that were reviewed today include: my prior office notes,  Prior echo  Review of the above records today demonstrates: as above  Last device remote is reviewed from [facility] PDF dated 12/22/2018 which reveals normal device function, NSVT noted, without symptoms   ASSESSMENT & PLAN:    1.  Complete heart block Remotes are up-to-date Normal remote 11/2018 reviewed  2. NSVT Asymptomatic EF previously preserved  3. HTN Stable No change required today  4. CAD No ischemic symptoms  5. COVID 19 screen The patient denies symptoms of COVID 19 at this time.  The importance of social distancing was discussed today.  Follow-up:  12 months with EP APP Next remote: 02/2019  Current medicines are reviewed at length with the patient today.   The patient does not have concerns regarding his medicines.  The following changes were made today:  none  Labs/ tests ordered today include:  No orders of the defined types were placed in this encounter.   Patient Risk:  after full review of this patients clinical status, I feel that they are at moderate risk at this  time.  Today, I have spent 15 minutes with the patient with telehealth technology discussing unsteadiness .    Army Fossa, MD  01/03/2019 3:34 PM     [place] [address] [phone] (office) [phone] (fax)

## 2019-01-12 ENCOUNTER — Other Ambulatory Visit: Payer: Self-pay | Admitting: Neurology

## 2019-01-19 DIAGNOSIS — Z85828 Personal history of other malignant neoplasm of skin: Secondary | ICD-10-CM | POA: Diagnosis not present

## 2019-01-19 DIAGNOSIS — L821 Other seborrheic keratosis: Secondary | ICD-10-CM | POA: Diagnosis not present

## 2019-01-19 DIAGNOSIS — L570 Actinic keratosis: Secondary | ICD-10-CM | POA: Diagnosis not present

## 2019-01-19 DIAGNOSIS — D485 Neoplasm of uncertain behavior of skin: Secondary | ICD-10-CM | POA: Diagnosis not present

## 2019-01-19 DIAGNOSIS — C44329 Squamous cell carcinoma of skin of other parts of face: Secondary | ICD-10-CM | POA: Diagnosis not present

## 2019-01-25 ENCOUNTER — Telehealth: Payer: Self-pay | Admitting: Neurology

## 2019-01-25 DIAGNOSIS — M47817 Spondylosis without myelopathy or radiculopathy, lumbosacral region: Secondary | ICD-10-CM

## 2019-01-25 NOTE — Telephone Encounter (Addendum)
Spoke with Dr. Felecia Shelling- ok to order another ESI (right at L5-S1)

## 2019-01-25 NOTE — Telephone Encounter (Signed)
Called pt. Relayed message. Advised I placed order in Epic and he will be called to schedule test. He verbalized understanding. He is going out of town in July and wants to try and have this done prior.

## 2019-01-25 NOTE — Telephone Encounter (Signed)
Pt is calling in requesting an authorization for an epidural shot

## 2019-01-30 IMAGING — XA Imaging study
2 series · 2 of 2 positions shown · non-contrast
Comparison: none

CLINICAL DATA: Lumbosacral spondylosis without myelopathy. Low back
and bilateral lower extremity pain, currently equal between left and
right sides though initially worse on the left. L5 pars defects with
anterolisthesis and severe neural foraminal stenosis.

[Series 1: ortho standard · 1 of 1 slices shown (1 of 2)]
[im 1/1]
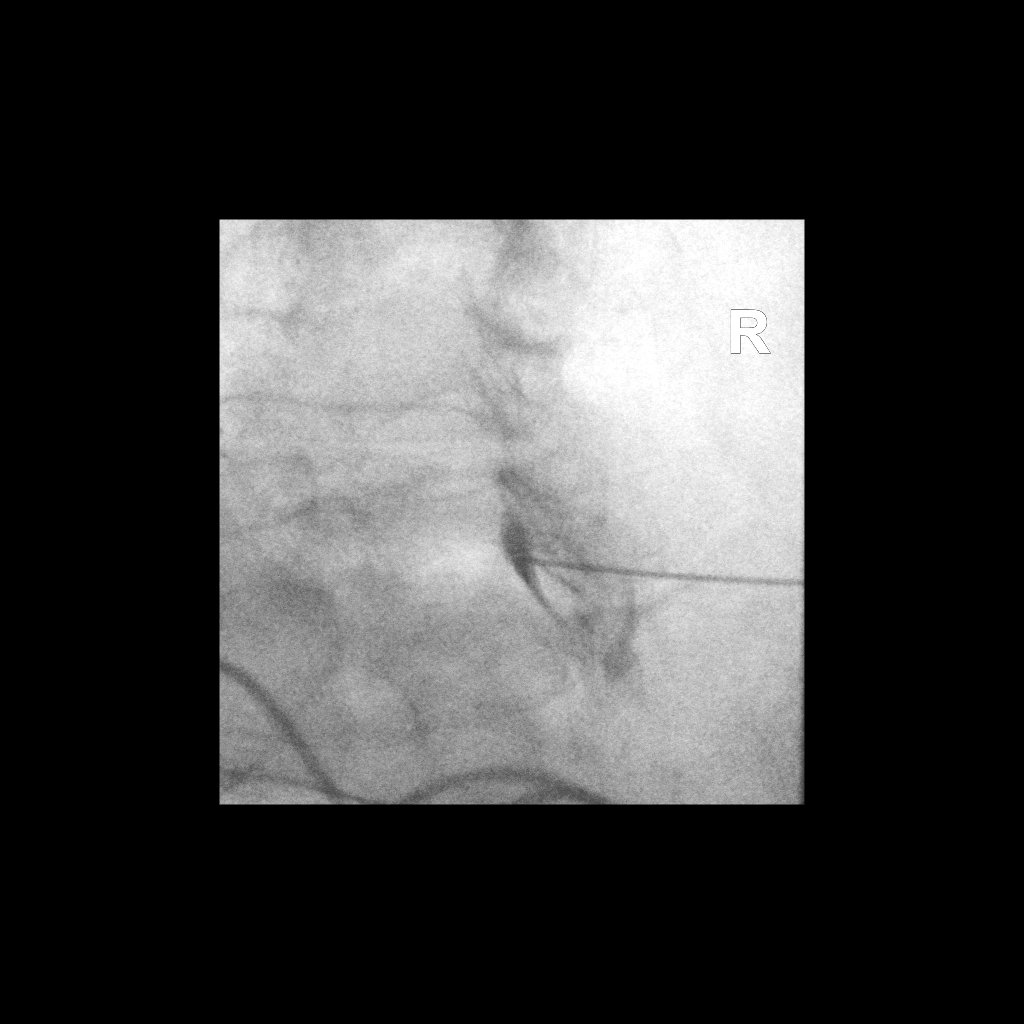

[Series 2: ortho standard · 1 of 1 slices shown (2 of 2)]
[im 1/1]
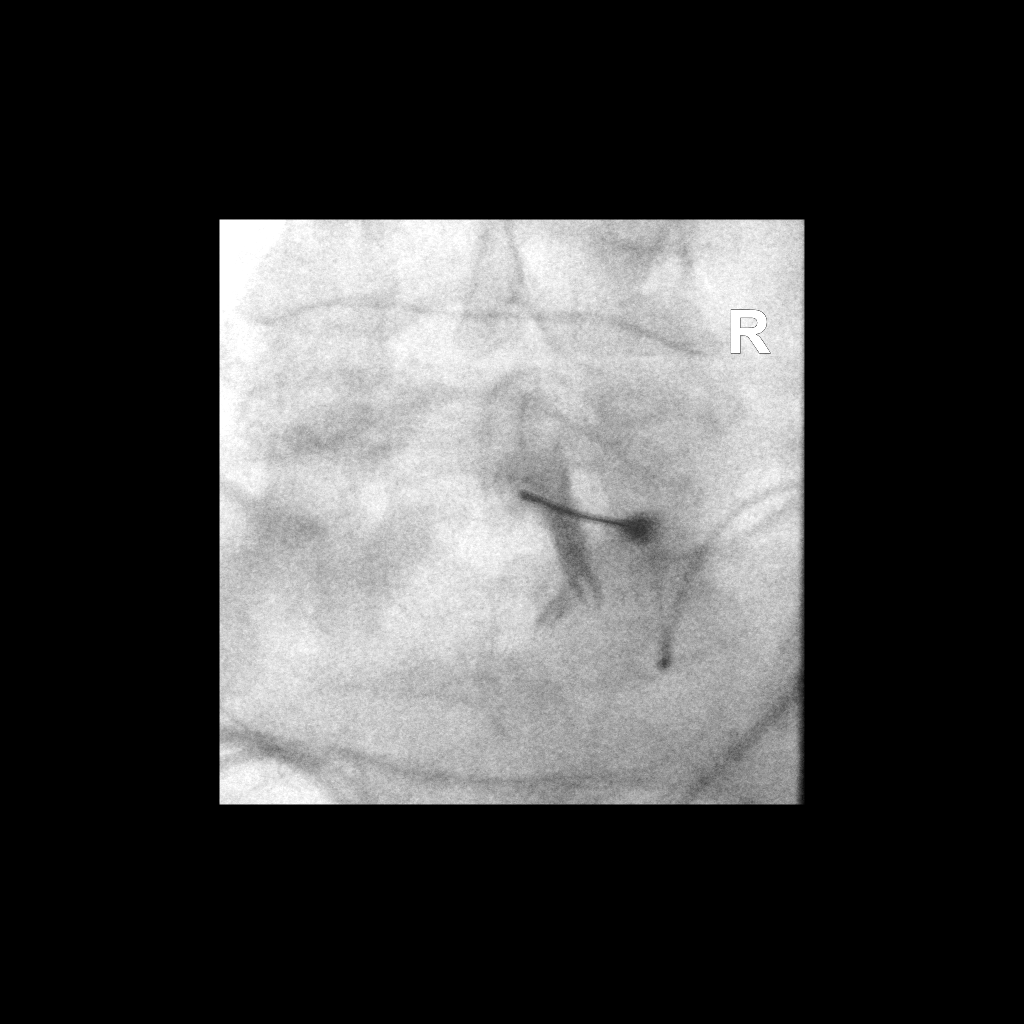

[2 of 2 positions shown; findings below may reference images not displayed]

FLUOROSCOPY TIME:  Radiation Exposure Index (as provided by the
fluoroscopic device): 16.63 microGray*m^2

Fluoroscopy Time (in minutes and seconds):  7 seconds

PROCEDURE:
The procedure, risks, benefits, and alternatives were explained to
the patient. Questions regarding the procedure were encouraged and
answered. The patient understands and consents to the procedure.

LUMBAR EPIDURAL INJECTION:

An interlaminar approach was performed on the right at L5-S1. The
overlying skin was cleansed and anesthetized. A 3.5 inch 20 gauge
epidural needle was advanced using loss-of-resistance technique.

DIAGNOSTIC EPIDURAL INJECTION:

Injection of Isovue-M 200 shows a good epidural pattern with spread
above and below the level of needle placement, bilaterally but
greater on the right. No vascular opacification is seen.

THERAPEUTIC EPIDURAL INJECTION:

120 mg of Depo-Medrol mixed with 3 mL of 1% lidocaine were
instilled. The procedure was well-tolerated, and the patient was
discharged thirty minutes following the injection in good condition.

COMPLICATIONS:
None.
IMPRESSION: Technically successful lumbar interlaminar epidural injection on the
right at L5-S1.

## 2019-02-03 DIAGNOSIS — H02834 Dermatochalasis of left upper eyelid: Secondary | ICD-10-CM | POA: Diagnosis not present

## 2019-02-03 DIAGNOSIS — H02831 Dermatochalasis of right upper eyelid: Secondary | ICD-10-CM | POA: Diagnosis not present

## 2019-02-03 DIAGNOSIS — H25813 Combined forms of age-related cataract, bilateral: Secondary | ICD-10-CM | POA: Diagnosis not present

## 2019-02-03 DIAGNOSIS — E119 Type 2 diabetes mellitus without complications: Secondary | ICD-10-CM | POA: Diagnosis not present

## 2019-02-14 ENCOUNTER — Other Ambulatory Visit: Payer: Self-pay

## 2019-02-14 ENCOUNTER — Ambulatory Visit
Admission: RE | Admit: 2019-02-14 | Discharge: 2019-02-14 | Disposition: A | Discharge status: Home / Self Care | Payer: Medicare Other | Source: Ambulatory Visit | Attending: Neurology | Admitting: Neurology

## 2019-02-14 DIAGNOSIS — M47817 Spondylosis without myelopathy or radiculopathy, lumbosacral region: Secondary | ICD-10-CM

## 2019-02-14 MED ORDER — METHYLPREDNISOLONE ACETATE 40 MG/ML INJ SUSP (RADIOLOG
120.0000 mg | Freq: Once | INTRAMUSCULAR | Status: AC
  Administered 2019-02-14: 120 mg via EPIDURAL

## 2019-02-14 MED ORDER — IOPAMIDOL (ISOVUE-M 200) INJECTION 41%
1.0000 mL | Freq: Once | INTRAMUSCULAR | Status: AC
  Administered 2019-02-14: 09:00:00 1 mL via EPIDURAL

## 2019-02-14 NOTE — Discharge Instructions (Signed)

## 2019-02-16 ENCOUNTER — Other Ambulatory Visit: Payer: Self-pay

## 2019-02-16 ENCOUNTER — Ambulatory Visit: Payer: Medicare Other | Admitting: Neurology

## 2019-02-16 ENCOUNTER — Encounter: Payer: Self-pay | Admitting: Neurology

## 2019-02-16 VITALS — BP 144/79 | HR 65 | Temp 97.9°F | Ht 69.0 in | Wt 225.0 lb

## 2019-02-16 DIAGNOSIS — M5417 Radiculopathy, lumbosacral region: Secondary | ICD-10-CM | POA: Diagnosis not present

## 2019-02-16 DIAGNOSIS — H02403 Unspecified ptosis of bilateral eyelids: Secondary | ICD-10-CM | POA: Diagnosis not present

## 2019-02-16 DIAGNOSIS — G4733 Obstructive sleep apnea (adult) (pediatric): Secondary | ICD-10-CM

## 2019-02-16 DIAGNOSIS — G7000 Myasthenia gravis without (acute) exacerbation: Secondary | ICD-10-CM | POA: Diagnosis not present

## 2019-02-16 DIAGNOSIS — M48061 Spinal stenosis, lumbar region without neurogenic claudication: Secondary | ICD-10-CM

## 2019-02-16 MED ORDER — PREDNISONE 1 MG PO TABS: ORAL_TABLET | ORAL | 0 refills | Status: DC

## 2019-02-16 NOTE — Progress Notes (Signed)
[facility]  PATIENT: David [name] DOB: 1936/03/04  REFERRING DOCTOR OR PCP:  [name] (PCP) and Rutherford Guys (Ophtho) SOURCE: Patient, records from Dr. Shelia Media, Lab results, CPAP download  _________________________________   HISTORICAL  CHIEF COMPLAINT:  Chief Complaint  Patient presents with  . Myasthenia Gravis    rm 12 follow up, wife- David [name], "had epidural yesterday"    HISTORY OF PRESENT ILLNESS:  David [name]  is a 83 y.o. man with myasthenia and OSA.     Update 02/16/2019 He feels his myasthenia gravis is doing reasonably well.  He does not note any double vision.  He continues to have mild ptosis but this is much improved from where he was before treatment.  He is able to get out of the chair without using his arms.  He is on CellCept and low-dose prednisone.  He asks about stopping the prednisone if possible.  He has experienced significant pain in the back and legs.  He has severe spinal stenosis at L3-L4 and also has a pars defect with anterolisthesis at L5-S1.  I reviewed the CT myelogram in his presence and showed the pertinent findings to him and his wife.  He has been doing epidural steroid injections every 3 to 4 months with benefit.  We did discuss that surgery could be considered for his symptoms that are likely coming from the spinal stenosis at L3-L4.  We will especially consider this if he gets less and less benefit from the epidurals over time.  CT myelogram 09/22/2017 IMPRESSION: 1. Diffuse lumbar disc and facet degeneration. 2. Multifactorial spinal stenosis at L3-4, severe with standing. Moderate bilateral neural foraminal stenosis. 3. L5 pars defects with grade 1/2 anterolisthesis which slightly increases with standing. Severe bilateral neural foraminal stenosis. 4. Mild-to-moderate left lateral recess stenosis at L2-3. 5.  Aortic Atherosclerosis (ICD10-I70.0).   He has OSA but has trouble using it due to sleeping on his  side.   He falls asleep easily.   He often wakes up 2 hours later and then is up/down the rest of the night.   He feels better nights he does not use it.    Update 10/13/2018: He feels his myasthenia gravis.   He is on pyridostigmine 60 mg 5 times a day but usually just takes 4 a day.    He is tolerating Cellcept 500 mg po bid and is on prednisone 10 mg daily.   He is putting off blepharoplasty for the time being.   No diplopia.   His legs feel a weak at times.   The left foot drop is better.    He has DJD and back pain and has had 2 ESIs that help the pain and slightly helps the walking.   He has spinal stenosis at L3L4 and possible left L3 nerve root compression and L5 pars defect with significant anterolisthesis and bilateral foraminal stenosis.   He now just takes one Aleve daily.  He has had nocturia (with some incontinence).  Alfuzosin has helped    He stopped using CPAP and we discussed it might help the nocturia.    He notes the cold air is bothersome and the droplets in his tube bothered him with the humidifier.     Update 05/12/2018: His myasthenia gravis is doing better.   He notes his MG improved on combination of higher mestinon and prednisone 40 mg and Cellcept 500 mg bid.  He tolerates the medications well.  The eyelid drooping is much better  though it still bothers him and he feels his visual fields are worsened.  He has foot pain bilaterally and note weakness on the left.  This started shortly after his epidural.  However, the more severe back and radiating leg pain has improved.  The right was also weak but improved.   He notes a foot drop with his walking.  He has OSA but has trouble using it due to sleeping on his side.   He falls asleep easily.   He often wakes up 2 hours later and then is up/down the rest of the night.   He feels better nights he does not use it.   His wife notes when he wears it there is no snoring but he snores very loud when he uses it.   I checked a download.  He  use the CPAP for 12 the last 13 nights between 30 minutes and 2 hours.  Update 03/26/2018: He is having worsening muscle weakness despite prednisone and mestinon.   He notes his strength is better early in the morning but by mid morning he has more ptosis and his neck becomes weaker.  He takes mestinon 60 mg po qid.   He is taking prednisone 40 mg po qd as well.     He notes neck weakness is still bad after mid morning.  His daughter is a Astronomer and notes that he has trouble eating a meal unless soft.   He notes he tires out easily with meals.    He does best with apple sauce consistency.   With soft food, he does not appear to be aspirating.  He has trouble falling asleep and staying asleep.    He has OSA but has trouble using CPAP.   He had a Tax adviser in [place].   He will bring by the reports as I could not find anything in the EMR.    He has left-sided lumbar radiculopathy (he has L3-L4 spinal stenosis and severe bilateral foraminal narrowing at L5-S1 with pars defects and anterolisthesis).   He had some benefit from an epidural steroid injection.    Update 02/23/2018: He reports having issues with redness in his eyes, R > L, and was prescribed eye drops.  While this was happening, he had more eyelid drooping.   He had more drooping after the drops than before the drops.   He saw his ophthalmologist (Dr. Gershon Crane) and then was placed on Tobramycin drops.   The infection has seemd cleared up the last 24 hours.    He is on pyridostigmine 60 mg po tid.   He toleratre  He reports pian in the left > right hamstrings and into the calf.   Sometimes there is just calf pain.   I took another look at his lumbar spine CT myelogram from earlier this year.  It does show multilevel degenerative changes with spinal stenosis at L3-L4 and significant anterolisthesis associated with pars defects at L5-S1 causing severe bilateral neuroforaminal stenosis at that level.  Either of these findings could be  related to his symptoms.  He is not interested in surgery at this time and we discussed some other options.  IMPRESSION: 1. Diffuse lumbar disc and facet degeneration. 2. Multifactorial spinal stenosis at L3-4, severe with standing. Moderate bilateral neural foraminal stenosis. 3. L5 pars defects with grade 1/2 anterolisthesis which slightly increases with standing. Severe bilateral neural foraminal stenosis. 4. Mild-to-moderate left lateral recess stenosis at L2-3.   Update 11/06/2017: He is on Mestinon  for his MG and he tolerates it well.   He is on only 30 mg 2-3 and symptoms improved.     He had a possible abscess on the left near his left ear and was prescribed clindamycin with benefit.   He then had a similar issue on the right with swelling so was placed on it again, with less benefit.  Due to continued pain, he was prescribed cyclobenzaprine 10/28/17 and felt weaker in his legs.   He only took one pill a day x 5 days      He has OSA but has not been using his CPAP.  We discussed trying to use it nightly.     Update 05/12/2017:     He feels the MG is doing well.   He takes mestinon 1/2 pill bid and tolerates it well.   He denies diplopia. No proximal weakness.  He has OSA and a new CPAP machine was ordered with a different mask.    He hasn't set it up as the bedroom was moved around (wife had knee surgery).  He snores but does not wake up.   He sleeps 5-6 hours sometimes wakingup once to urinate.   He rarely dozes off unintentionally, only if tired.      _________________________________________ From 12/31/2016  MG:    His ptosis improved with Mestinon. He had trouble tolerating higher dosages but notes continued clinical benefit benefit at a relatively low dose of 30 mg 2-3 times a day (usually takes twice a day but will take a third pill if he could be out of the house).    Higher dose caused diarrhea.    He denies any weakness in the shoulders or hips. He is able to stand up from a  chair multiple times without using his arms he can stand up from a squatting position.    Vallecular mass?:   We had checked CT scan of the chest and neck to rule out thymoma.   None was found.   However, there was asymmetry with the possible soft tissue mass involving the right vallecular.      He is not a smoker or drinker or heavy drinker.    He saw Dr. Benjamine Mola of ENT and had a scope and nothing   OSA;  He has OSA ans is on CPAP.    PSG 01/07/99 showed an RDI = 99 and PSG 2004 showed RDI = 78.   He has a new machine since 2013 (Resmed).    He stopped CPAP 2 years ago because of comfort issues.     He occasionally nods off due to EDS but usually does not.      MG History:   He had the onset of ptosis, while traveling in [place] and [place], about 3 weeks ago. He first noted the ptosis while trying to watch a movie in the bus (screen was up high) and noting that the eyelids were covering his vision.  He did note the trip was stressful and he and his wife both had injuries (he sprained ankle and wife broke shoulder).   Lab work was performed and the acetylcholine binding antibodies were elevated at 1.85 (less than 0.24 normal). 57% were blocking antibodies and 25% or modulating antibodies.   Other lab work was noncontributory.     CT scan of the head showed age-related mild chronic microvascular ischemic change.      REVIEW OF SYSTEMS: Constitutional: No fevers, chills, sweats, or change in appetite Eyes:  No visual changes, double vision, eye pain.   He notes some ptosis which reduces his visual fields superiorly Ear, nose and throat: No hearing loss, ear pain, nasal congestion, sore throat Cardiovascular: No chest pain, palpitations Respiratory: No shortness of breath at rest or with exertion.   No wheezes.  He has OSA and is having trouble using CPAP. GastrointestinaI: No nausea, vomiting, diarrhea, abdominal pain, fecal incontinence Genitourinary: No dysuria, urinary retention or frequency.  No  nocturia. Musculoskeletal: No neck pain, back pain Integumentary: No rashes. Neurological: as above Psychiatric: No depression at this time.  No anxiety Endocrine: No palpitations, diaphoresis, change in appetite, change in weigh or increased thirst Hematologic/Lymphatic: No anemia, purpura, petechiae. Allergic/Immunologic: No itchy/runny eyes, nasal congestion, recent allergic reactions, rashes  ALLERGIES: Allergies  Allergen Reactions  . Gris-Peg [Griseofulvin] Other (See Comments)    headaches  . Terbinafine Other (See Comments)    headache    HOME MEDICATIONS:  Current Outpatient Medications:  .  alfuzosin (UROXATRAL) 10 MG 24 hr tablet, , Disp: , Rfl:  .  aspirin EC 81 MG tablet, Take 81 mg by mouth daily. , Disp: , Rfl:  .  Cholecalciferol (VITAMIN D3 PO), Take 1 tablet by mouth daily., Disp: , Rfl:  .  ezetimibe-simvastatin (VYTORIN) 10-40 MG per tablet, Take 1 tablet by mouth at bedtime., Disp: , Rfl:  .  fish oil-omega-3 fatty acids 1000 MG capsule, Take 1 g by mouth daily. , Disp: , Rfl:  .  glimepiride (AMARYL) 4 MG tablet, Take 1 tablet by mouth daily., Disp: , Rfl:  .  glucosamine-chondroitin 500-400 MG tablet, Take 1 tablet by mouth daily. , Disp: , Rfl:  .  loratadine (CLARITIN) 10 MG tablet, Take 10 mg by mouth daily., Disp: , Rfl:  .  Multiple Vitamins-Minerals (MULTIVITAMIN WITH MINERALS) tablet, Take 1 tablet by mouth daily., Disp: , Rfl:  .  mycophenolate (CELLCEPT) 250 MG capsule, Take 2 pills po twice a day, Disp: 120 capsule, Rfl: 5 .  nitroGLYCERIN (NITROSTAT) 0.4 MG SL tablet, Place 1 tablet (0.4 mg total) under the tongue every 5 (five) minutes as needed for chest pain. Reported on 11/01/2015, Disp: 25 tablet, Rfl: 1 .  pyridostigmine (MESTINON) 60 MG tablet, Take up to 5 pills, Disp: 150 tablet, Rfl: 11 .  tamsulosin (FLOMAX) 0.4 MG CAPS capsule, Take 0.4 mg by mouth 2 (two) times a day. Reported on 11/01/2015, Disp: , Rfl:  .  traZODone (DESYREL) 50 MG  tablet, TAKE 1/2 TO 1 TABLET BY MOUTH EVERY NIGHT AT BEDTIME, Disp: 30 tablet, Rfl: 5 .  triamterene-hydrochlorothiazide (MAXZIDE-25) 37.5-25 MG per tablet, Take 1 tablet by mouth daily., Disp: , Rfl:  .  predniSONE (DELTASONE) 1 MG tablet, Take 4 po daily x 10 days, then 3 po daily x 10 days, then 2 po daily x 10 d then 1 po daily x 10 d then stop, Disp: 100 tablet, Rfl: 0  PAST MEDICAL HISTORY: Past Medical History:  Diagnosis Date  . CAD (coronary artery disease)    previous stent to LAD 2003  . Cancer ([place])    skin  . Hyperlipidemia   . Hypertension   . LBBB (left bundle branch block)   . Myasthenia gravis ([place])   . S/P cardiac pacemaker procedure, placement 10/24/14 Medtronic Adapta L model ADDRL 1  10/25/2014    PAST SURGICAL HISTORY: Past Surgical History:  Procedure Laterality Date  . CORONARY ANGIOPLASTY WITH STENT PLACEMENT  03/01/2002   stenting to LAD -  Dr. Glade Lloyd  . NM MYOCAR PERF WALL MOTION  11/27/2010   normal  . PERMANENT PACEMAKER INSERTION N/A 10/24/2014   Procedure: PERMANENT PACEMAKER INSERTION;  Surgeon: [name], MD;  Location: [facility] CATH LAB;  Service: Cardiovascular;  Laterality: N/A;  . TONSILLECTOMY    . US ECHOCARDIOGRAPHY  12/05/2010   proximal septal thickening,mild concentric LVH,mild deptal hypokinesis,RV mildly dilated,LA mildly dilated,mild AI,moderate aortic root dilatation,septal motion c/w conduction abnormality    FAMILY HISTORY: Family History  Problem Relation Age of Onset  . Breast cancer Mother   . Colon cancer Father   . Lung cancer Father     SOCIAL HISTORY:  Social History   Socioeconomic History  . Marital status: Married    Spouse name: Not on file  . Number of children: Not on file  . Years of education: Not on file  . Highest education level: Not on file  Occupational History  . Not on file  Social Needs  . Financial resource strain: Not on file  . Food insecurity    Worry: Not on file    Inability: Not on file  .  Transportation needs    Medical: Not on file    Non-medical: Not on file  Tobacco Use  . Smoking status: Former Smoker    Types: Cigarettes, Cigars    Start date: 04/28/1949    Quit date: 10/27/1982    Years since quitting: 36.3  . Smokeless tobacco: Never Used  Substance and Sexual Activity  . Alcohol use: Yes    Comment: 1-2 drink per week  . Drug use: No  . Sexual activity: Not on file  Lifestyle  . Physical activity    Days per week: Not on file    Minutes per session: Not on file  . Stress: Not on file  Relationships  . Social Herbalist on phone: Not on file    Gets together: Not on file    Attends religious service: Not on file    Active member of club or organization: Not on file    Attends meetings of clubs or organizations: Not on file    Relationship status: Not on file  . Intimate partner violence    Fear of current or ex partner: Not on file    Emotionally abused: Not on file    Physically abused: Not on file    Forced sexual activity: Not on file  Other Topics Concern  . Not on file  Social History Narrative  . Not on file     PHYSICAL EXAM  Vitals:   02/16/19 1043  BP: (!) 144/79  Pulse: 65  Temp: 97.9 F (36.6 C)  Weight: 225 lb (102.1 kg)  Height: 5\' 9"  (1.753 m)    Body mass index is 33.23 kg/m.     General: The patient is well-developed and well-nourished and in no acute distress   Neurologic Exam  Mental status: The patient is alert and oriented x 3 at the time of the examination. The patient has apparent normal recent and remote memory, with an apparently normal attention span and concentration ability.   Speech is normal.  Cranial nerves: Extraocular muscles were intact even with prolonged upgaze.  He has minimal ptosis that does not worsen with prolonged upgaze.   He has slightly reduced neck extension strength.  The voice is strong.. Trapezius is strong.  The tongue is midline, and the patient has symmetric elevation of the  soft palate. No obvious hearing  deficits are noted.  Motor:  Muscle bulk is normal.   Tone is normal. Strength is  5 / 5 in the arms shoulders and neck.  Strength is 3 to 4-/5 in the L5 innervated foot and ankle muscles on the left and 5/5 elsewhere in the left leg.  EHL is 4+/5 on the right and he is 5/5 elsewhere on the right.  He can stand from a seated position 4 times without using his arms.  Sensory: He has reduced sensation in the left foot.  Coordination: Cerebellar testing reveals good finger-nose-finger and mildly reduced left heel-to-shin .  Gait and station: Station is normal.  Stride is reduced and gait is arthritic but he has less foot drop on the left.  He cannot do a tandem walk. Romberg is negative.   Reflexes: Deep tendon reflexes are symmetric and normal in the arms and knees but absent at the ankles    DIAGNOSTIC DATA (LABS, IMAGING, TESTING) - I reviewed patient records, labs, notes, testing and imaging myself where available.  Lab Results  Component Value Date   WBC 6.1 04/21/2016   HGB 14.3 04/21/2016   HCT 42.0 04/21/2016   MCV 93.9 04/21/2016   PLT 228 04/21/2016      Component Value Date/Time   NA 141 01/02/2017 0920   K 3.8 01/02/2017 0920   CL 101 01/02/2017 0920   CO2 26 01/02/2017 0920   GLUCOSE 131 (H) 01/02/2017 0920   GLUCOSE 99 04/21/2016 1651   BUN 19 01/02/2017 0920   CREATININE 0.84 01/02/2017 0920   CALCIUM 9.0 01/02/2017 0920   PROT 6.3 (L) 04/21/2016 1630   ALBUMIN 3.9 04/21/2016 1630   AST 25 04/21/2016 1630   ALT 21 04/21/2016 1630   ALKPHOS 60 04/21/2016 1630   BILITOT 1.2 04/21/2016 1630   GFRNONAA 83 01/02/2017 0920   GFRAA 96 01/02/2017 0920   No results found for: CHOL, HDL, LDLCALC, LDLDIRECT, TRIG, CHOLHDL Lab Results  Component Value Date   HGBA1C 6.6 (H) 10/24/2014   No results found for: [id number] Lab Results  Component Value Date   TSH 1.402 10/24/2014       ASSESSMENT AND PLAN    1. Myasthenia gravis  ([place])   2. Lumbosacral radiculopathy   3. OSA (obstructive sleep apnea)   4. Ptosis of both eyelids   5. Degenerative lumbar spinal stenosis     1.. His myasthenia gravis is doing well on CellCept 5 mg twice a day and prednisone  5 mg daily.  We will try to taper the prednisone off over a few weeks by tapering 1 mg at a time 10 days any step.  We will check the CBC and LFT. 2.   Stay active and exercise as tolerated.   3.   He is advised to try to use CPAP nightly.   4.    He will return to see me in 5-6 months but call sooner if he has new or worsening neurologic symptoms.  40-minute face-to-face evaluation with greater than half the time counseling coordinating care about his back related issues and myasthenia.    Jenie Parish A. Felecia Shelling, MD, PhD 0/24/0973, 5:32 PM Certified in Neurology, Clinical Neurophysiology, Sleep Medicine, Pain Medicine and Neuroimaging  [facility] [address], [place], [place] [address] [phone]

## 2019-02-17 ENCOUNTER — Telehealth: Payer: Self-pay | Admitting: *Deleted

## 2019-02-17 LAB — CBC WITH DIFFERENTIAL/PLATELET
Basophils Absolute: 0 10*3/uL (ref 0.0–0.2)
Basos: 0 %
EOS (ABSOLUTE): 0 10*3/uL (ref 0.0–0.4)
Eos: 0 %
Hematocrit: 40.1 % (ref 37.5–51.0)
Hemoglobin: 11.8 g/dL — ABNORMAL LOW (ref 13.0–17.7)
Immature Grans (Abs): 0.1 10*3/uL (ref 0.0–0.1)
Immature Granulocytes: 1 %
Lymphocytes Absolute: 1.5 10*3/uL (ref 0.7–3.1)
Lymphs: 14 %
MCH: 24 pg — ABNORMAL LOW (ref 26.6–33.0)
MCHC: 29.4 g/dL — ABNORMAL LOW (ref 31.5–35.7)
MCV: 82 fL (ref 79–97)
Monocytes Absolute: 1 10*3/uL — ABNORMAL HIGH (ref 0.1–0.9)
Monocytes: 9 %
Neutrophils Absolute: 8.3 10*3/uL — ABNORMAL HIGH (ref 1.4–7.0)
Neutrophils: 76 %
Platelets: 295 10*3/uL (ref 150–450)
RBC: 4.92 x10E6/uL (ref 4.14–5.80)
RDW: 14 % (ref 11.6–15.4)
WBC: 10.8 10*3/uL (ref 3.4–10.8)

## 2019-02-17 LAB — COMPREHENSIVE METABOLIC PANEL
ALT: 27 IU/L (ref 0–44)
AST: 26 IU/L (ref 0–40)
Albumin/Globulin Ratio: 2.1 (ref 1.2–2.2)
Albumin: 4.5 g/dL (ref 3.6–4.6)
Alkaline Phosphatase: 51 IU/L (ref 39–117)
BUN/Creatinine Ratio: 31 — ABNORMAL HIGH (ref 10–24)
BUN: 19 mg/dL (ref 8–27)
Bilirubin Total: 0.8 mg/dL (ref 0.0–1.2)
CO2: 26 mmol/L (ref 20–29)
Calcium: 8.9 mg/dL (ref 8.6–10.2)
Chloride: 98 mmol/L (ref 96–106)
Creatinine, Ser: 0.62 mg/dL — ABNORMAL LOW (ref 0.76–1.27)
GFR calc Af Amer: 108 mL/min/{1.73_m2} (ref >59)
GFR calc non Af Amer: 93 mL/min/{1.73_m2} (ref >59)
Globulin, Total: 2.1 g/dL (ref 1.5–4.5)
Glucose: 61 mg/dL — ABNORMAL LOW (ref 65–99)
Potassium: 4.2 mmol/L (ref 3.5–5.2)
Sodium: 141 mmol/L (ref 134–144)
Total Protein: 6.6 g/dL (ref 6.0–8.5)

## 2019-02-17 NOTE — Telephone Encounter (Signed)
Called, LVM for pt relaying results per Dr. Felecia Shelling note. Gave [facility] phone number if he has further questions/concerns.

## 2019-02-17 NOTE — Telephone Encounter (Signed)
-----   Message from Britt Bottom, MD sent at 02/17/2019  8:41 AM EDT ----- Please let the patient know that the lab work is fine. (on mycophenolate, continue same dose)

## 2019-03-16 DIAGNOSIS — I2510 Atherosclerotic heart disease of native coronary artery without angina pectoris: Secondary | ICD-10-CM | POA: Diagnosis not present

## 2019-03-16 DIAGNOSIS — G7000 Myasthenia gravis without (acute) exacerbation: Secondary | ICD-10-CM | POA: Diagnosis not present

## 2019-03-16 DIAGNOSIS — I10 Essential (primary) hypertension: Secondary | ICD-10-CM | POA: Diagnosis not present

## 2019-03-16 DIAGNOSIS — R7303 Prediabetes: Secondary | ICD-10-CM | POA: Diagnosis not present

## 2019-03-23 ENCOUNTER — Telehealth: Payer: Self-pay | Admitting: Neurology

## 2019-03-23 DIAGNOSIS — G7000 Myasthenia gravis without (acute) exacerbation: Secondary | ICD-10-CM

## 2019-03-23 DIAGNOSIS — R269 Unspecified abnormalities of gait and mobility: Secondary | ICD-10-CM

## 2019-03-23 NOTE — Telephone Encounter (Signed)
Pt has called asking about a referral for physical therapy for balance issues, please call

## 2019-03-23 NOTE — Addendum Note (Signed)
Addended by: Hope Pigeon on: 03/23/2019 12:08 PM   Modules accepted: Orders

## 2019-03-23 NOTE — Telephone Encounter (Signed)
Ok to refer.

## 2019-03-23 NOTE — Telephone Encounter (Signed)
I called pt back. He would like to be referred to [facility] PT/neuro-rehab next door to [place]. Advised I will make sure this is ok with Dr. Felecia Shelling and place referral if ok. He verbalized understanding

## 2019-03-23 NOTE — Telephone Encounter (Signed)
Placed referral  

## 2019-03-23 NOTE — Addendum Note (Signed)
Addended by: Hope Pigeon on: 03/23/2019 04:43 PM   Modules accepted: Orders

## 2019-03-24 ENCOUNTER — Ambulatory Visit (INDEPENDENT_AMBULATORY_CARE_PROVIDER_SITE_OTHER): Payer: Medicare Other | Admitting: *Deleted

## 2019-03-24 DIAGNOSIS — I442 Atrioventricular block, complete: Secondary | ICD-10-CM | POA: Diagnosis not present

## 2019-03-26 LAB — CUP PACEART REMOTE DEVICE CHECK
Battery Impedance: 285 Ohm
Battery Remaining Longevity: 102 mo
Battery Voltage: 2.78 V
Brady Statistic AP VP Percent: 3 %
Brady Statistic AP VS Percent: 0 %
Brady Statistic AS VP Percent: 96 %
Brady Statistic AS VS Percent: 1 %
Date Time Interrogation Session: 20200828232001
Implantable Lead Implant Date: 20160329
Implantable Lead Implant Date: 20160329
Implantable Lead Location: 753859
Implantable Lead Location: 753860
Implantable Lead Model: 5076
Implantable Lead Model: 5092
Implantable Pulse Generator Implant Date: 20160329
Lead Channel Impedance Value: 471 Ohm
Lead Channel Impedance Value: 565 Ohm
Lead Channel Pacing Threshold Amplitude: 0.625 V
Lead Channel Pacing Threshold Amplitude: 1 V
Lead Channel Pacing Threshold Pulse Width: 0.4 ms
Lead Channel Pacing Threshold Pulse Width: 0.4 ms
Lead Channel Setting Pacing Amplitude: 2 V
Lead Channel Setting Pacing Amplitude: 2.5 V
Lead Channel Setting Pacing Pulse Width: 0.4 ms
Lead Channel Setting Sensing Sensitivity: 2.8 mV

## 2019-03-29 NOTE — Progress Notes (Signed)
Remote pacemaker transmission.   

## 2019-04-05 DIAGNOSIS — R3911 Hesitancy of micturition: Secondary | ICD-10-CM | POA: Diagnosis not present

## 2019-04-05 DIAGNOSIS — R351 Nocturia: Secondary | ICD-10-CM | POA: Diagnosis not present

## 2019-04-05 DIAGNOSIS — N401 Enlarged prostate with lower urinary tract symptoms: Secondary | ICD-10-CM | POA: Diagnosis not present

## 2019-04-05 DIAGNOSIS — R3912 Poor urinary stream: Secondary | ICD-10-CM | POA: Diagnosis not present

## 2019-04-09 ENCOUNTER — Other Ambulatory Visit: Payer: Self-pay | Admitting: Neurology

## 2019-04-11 ENCOUNTER — Telehealth: Payer: Self-pay | Admitting: *Deleted

## 2019-04-11 NOTE — Telephone Encounter (Signed)
Submitted PA mycophenolate on CMM. Key: [id number] - Rx #: [id number]. Waiting on determination from [facility].

## 2019-04-12 NOTE — Telephone Encounter (Signed)
Almyra Free w/ [facility] [employer] called to inform our office that Mycophenolate 250 mg has been approved for 1 year. She will fax the approval to our office. For any questions, call [phone] opt 5.

## 2019-04-12 NOTE — Telephone Encounter (Signed)
Noted, also received fax notification of approval. I faxed notice of approval to [facility] at [phone]. Received fax confirmation. Approval dates: 04/11/19-04/10/20

## 2019-04-13 ENCOUNTER — Other Ambulatory Visit: Payer: Self-pay

## 2019-04-13 ENCOUNTER — Ambulatory Visit: Payer: Medicare Other | Attending: Neurology | Admitting: Physical Therapy

## 2019-04-13 DIAGNOSIS — M545 Low back pain, unspecified: Secondary | ICD-10-CM

## 2019-04-13 DIAGNOSIS — M6281 Muscle weakness (generalized): Secondary | ICD-10-CM | POA: Diagnosis not present

## 2019-04-13 DIAGNOSIS — R262 Difficulty in walking, not elsewhere classified: Secondary | ICD-10-CM | POA: Diagnosis not present

## 2019-04-13 DIAGNOSIS — R2689 Other abnormalities of gait and mobility: Secondary | ICD-10-CM | POA: Diagnosis not present

## 2019-04-13 DIAGNOSIS — G8929 Other chronic pain: Secondary | ICD-10-CM | POA: Diagnosis not present

## 2019-04-14 ENCOUNTER — Encounter: Payer: Self-pay | Admitting: Physical Therapy

## 2019-04-14 NOTE — Therapy (Signed)
[facility] [address] [place], [place], [address] Phone: [phone]   Fax:  [phone]  Physical Therapy Evaluation  Patient Details  Name: David [name] MRN: [id number] Date of Birth: [birth year] Referring Provider (PT): Britt Bottom, MD   Encounter Date: 04/13/2019  PT End of Session - 04/14/19 1603    Visit Number  1    Number of Visits  12    Date for PT Re-Evaluation  05/26/19    Authorization Type  BCBS MCR    PT Start Time  [id number]    PT Stop Time  1615    PT Time Calculation (min)  45 min    Activity Tolerance  Patient tolerated treatment well       Past Medical History:  Diagnosis Date  . CAD (coronary artery disease)    previous stent to LAD 2003  . Cancer ([place])    skin  . Hyperlipidemia   . Hypertension   . LBBB (left bundle branch block)   . Myasthenia gravis ([place])   . S/P cardiac pacemaker procedure, placement 10/24/14 Medtronic Adapta L model ADDRL 1  10/25/2014    Past Surgical History:  Procedure Laterality Date  . CORONARY ANGIOPLASTY WITH STENT PLACEMENT  03/01/2002   stenting to LAD - Dr. Glade Lloyd  . NM MYOCAR PERF WALL MOTION  11/27/2010   normal  . PERMANENT PACEMAKER INSERTION N/A 10/24/2014   Procedure: PERMANENT PACEMAKER INSERTION;  Surgeon: [name], MD;  Location: [facility] CATH LAB;  Service: Cardiovascular;  Laterality: N/A;  . TONSILLECTOMY    . US ECHOCARDIOGRAPHY  12/05/2010   proximal septal thickening,mild concentric LVH,mild deptal hypokinesis,RV mildly dilated,LA mildly dilated,mild AI,moderate aortic root dilatation,septal motion c/w conduction abnormality    There were no vitals filed for this visit.   Subjective Assessment - 04/14/19 1534    Subjective  His legs feel a weak at times and he gets swelling in his feet. His legs are giving out on him more.   He has DJD and back pain and has had 2 ESIs that help the pain and slightly helps the walking.   He has chronic  LBP and relays one MD wanted to have surgery due to spinal stenosis at [facility] and possible left L3 nerve root compression and L5 pars defect with significant anterolisthesis and bilateral foraminal stenosis. He relays leg weakness, problems with gait and balance and has about one fall per month. 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    Pertinent History  PMH: CAD, HTN, myasthenia gravis, Pacemaker    Limitations  Sitting;Standing;Walking;House hold activities    How long can you sit comfortably?  depends    How long can you stand comfortably?  10 min    How long can you walk comfortably?  longer if he uses cane, depends on hanging onto cart for grocery store distance    Diagnostic tests  lumbar CT    Patient Stated Goals  decrease pain, improve balance, improve strength    Currently in Pain?  Yes    Pain Score  5     Pain Location  Back    Pain Orientation  Lower    Pain Descriptors / Indicators  Aching    Pain Type  Chronic pain    Pain Radiating Towards  sometimes down his legs, denies N/T at this time    Aggravating Factors   prolonged positions    Pain  Relieving Factors  riding his lawnmower    Multiple Pain Sites  No         [facility] PT Assessment - 04/14/19 0001      Assessment   Medical Diagnosis  gait, balance, falls, Myasthenia Gravis    Referring Provider (PT)  Sater, Nanine Means, MD    Onset Date/Surgical Date  --   Chronic pain   Next MD Visit  not scheduled    Prior Therapy  none      Precautions   Precautions  Fall      Balance Screen   Has the patient fallen in the past 6 months  Yes    How many times?  6   about one fall per month, he gets tripped up, denies injury   Has the patient had a decrease in activity level because of a fear of falling?   No    Is the patient reluctant to leave their home because of a fear of falling?   No      Home Film/video editor residence      Prior Function    Level of Independence  Independent      Cognition   Overall Cognitive Status  Within Functional Limits for tasks assessed      Coordination   Gross Motor Movements are Fluid and Coordinated  Yes      Posture/Postural Control   Posture Comments  slumped posture      ROM / Strength   AROM / PROM / Strength  AROM;Strength      AROM   AROM Assessment Site  Lumbar    Lumbar Flexion  75%    Lumbar Extension  25%    Lumbar - Right Side Bend  50%    Lumbar - Left Side Bend  50%    Lumbar - Right Rotation  75%    Lumbar - Left Rotation  75%      Strength   Overall Strength Comments  Grossly Bilat hip strength 4/5 MMT, knee flexion 4+/5 bilat, knee ext 5/5 bilat, Rt ankle 4+/5 Lt ankle DF 4/5, PF 4+/5, INV and EV 4/5      Transfers   Transfers  Independent with all Transfers    Five time sit to stand comments   12.5 seconds from standard chair but must use arm rests      Ambulation/Gait   Ambulation/Gait  Yes    Ambulation/Gait Assistance  5: Supervision    Ambulation Distance (Feet)  115 Feet    Assistive device  Straight cane    Gait Pattern  Decreased hip/knee flexion - right;Decreased hip/knee flexion - left;Decreased stride length;Decreased dorsiflexion - left    Ambulation Surface  Level;Indoor    Gait Comments  He can walk without AD but more unsteady especially with turning, slower velocity overall      Standardized Balance Assessment   Standardized Balance Assessment  Berg Balance Test;Timed Up and Go Test      Berg Balance Test   Sit to Stand  Able to stand  independently using hands    Standing Unsupported  Able to stand 2 minutes with supervision    Sitting with Back Unsupported but Feet Supported on Floor or Stool  Able to sit safely and securely 2 minutes    Stand to Sit  Controls descent by using hands    Transfers  Able to transfer safely, definite need of hands    Standing Unsupported with  Eyes Closed  Able to stand 10 seconds with supervision    Standing  Unsupported with Feet Together  Able to place feet together independently and stand for 1 minute with supervision    From Standing, Reach Forward with Outstretched Arm  Can reach forward >12 cm safely (5")    From Standing Position, Pick up Object from [place] to pick up shoe, needs supervision    From Standing Position, Turn to Look Behind Over each Shoulder  Looks behind one side only/other side shows less weight shift    Turn 360 Degrees  Able to turn 360 degrees safely but slowly    Standing Unsupported, Alternately Place Feet on Step/Stool  Able to complete >2 steps/needs minimal assist    Standing Unsupported, One Foot in [place] to take small step independently and hold 30 seconds    Standing on One Leg  Unable to try or needs assist to prevent fall    Total Score  36      Timed Up and Go Test   Normal TUG (seconds)  13.5                Objective measurements completed on examination: See above findings.              PT Education - 04/14/19 1603    Education Details  HEP, POC, exam findings    Person(s) Educated  Patient    Methods  Explanation;Demonstration;Verbal cues;Handout    Comprehension  Verbalized understanding       PT Short Term Goals - 04/14/19 1638      PT SHORT TERM GOAL #1   Title  Pt will be I and compliant with HEP. 4 weeks 04/14/19    Status  New      PT SHORT TERM GOAL #2   Title  Pt will report no more falls. 4 weeks    Status  New        PT Long Term Goals - 04/14/19 1640      PT LONG TERM GOAL #1   Title  Pt will improve leg strength to 4+/5 MMT overall to improve function (Target for all goals 8 weeks 05/26/19)    Status  New      PT LONG TERM GOAL #2   Title  Pt will improve BERG to at least 46 to show improved balance.    Baseline  38      PT LONG TERM GOAL #3   Title  Pt will perform 6MWT and will write appropriate goal for this once tested to show improved endurance    Status  New      PT LONG TERM GOAL  #4   Title  Pt will report overall improved activity tolerance and decreased pain with prolonged sitting, standing, walking.    Status  New             Plan - 04/14/19 1618    Clinical Impression Statement  Pt presents with gait and balance deficits, general leg weakness, myasthenia gravis and chronic LBP and relays one MD wanted to have surgery due to spinal stenosis at L3L4 and possible left L3 nerve root compression and L5 pars defect with significant anterolisthesis and bilateral foraminal stenosis. He has had about one fall per month over the last 6 months. He will benefit from skilled PT to address his deficits and reduce his risk of falling.    Personal Factors and Comorbidities  Age;Comorbidity 2;Comorbidity 3+  Comorbidities  PMH: CAD, HTN, myasthenia gravis, Pacemaker    Examination-Activity Limitations  Locomotion Level;Transfers;Bend;Sit;Carry;Squat;Stairs;Lift;Stand    Examination-Participation Restrictions  Driving;Community Activity;Yard Work    [employer]chant [employer] officer  Evolving/Moderate complexity    Clinical Decision Making  Moderate    Rehab Potential  Good    PT Frequency  2x / week    PT Duration  8 weeks    PT Treatment/Interventions  ADLs/Self Care Home Management;Aquatic Therapy;Cryotherapy;Dentist;Therapeutic activities;Therapeutic exercise;Balance training;Neuromuscular re-education;Manual techniques;Orthotic Fit/Training;Passive range of motion;Energy conservation;Joint Manipulations;Taping    PT Next Visit Plan  needs balance, general leg strength and conditioning, consider 6MWT and FGA    PT Home Exercise Plan  Access Code: [email or web address]    Consulted and Agree with Plan of Care  Patient       Patient will benefit from skilled therapeutic intervention in order to improve the following deficits and impairments:  Abnormal gait, Cardiopulmonary status limiting activity, Decreased activity tolerance,  Decreased balance, Decreased endurance, Decreased range of motion, Decreased strength, Difficulty walking, Increased muscle spasms, Pain, Improper body mechanics, Postural dysfunction  Visit Diagnosis: Other abnormalities of gait and mobility  Difficulty in walking, not elsewhere classified  Chronic bilateral low back pain without sciatica  Muscle weakness (generalized)     Problem List Patient Active Problem List   Diagnosis Date Noted  . Dermatochalasis of both upper eyelids 06/15/2018  . Peroneal neuropathy, left 05/12/2018  . Left foot drop 05/12/2018  . Dysphagia 03/26/2018  . Lumbosacral radiculopathy 02/23/2018  . Facial pain 11/06/2017  . Degenerative lumbar spinal stenosis 09/03/2017  . Vallecular mass 12/31/2016  . Myasthenia gravis ([place]) 05/01/2016  . Ptosis 05/01/2016  . CAD in native artery 11/03/2015  . OSA (obstructive sleep apnea) 11/03/2015  . S/P cardiac pacemaker procedure, placement 10/24/14 Medtronic Adapta L model ADDRL 1  10/25/2014  . Complete heart block ([place]) 10/24/2014  . CHB (complete heart block), symptomatic 10/24/2014  . LAD stent 2003 10/26/2013  . HTN (hypertension) 10/26/2013  . Hyperlipidemia with target LDL less than 70 10/26/2013  . Mild obesity 10/26/2013  . Spermatic cord mass-left 03/25/2012    Silvestre Mesi 04/14/2019, 4:48 PM  [facility] [address] [place], [place], [address] Phone: [phone]   Fax:  [phone]  Name: TARELLE YANES MRN: [id number] Date of Birth: 07/13/1936

## 2019-04-14 NOTE — Patient Instructions (Signed)
Access Code: [place]  URL: [email or web address]  Date: 04/14/2019  Prepared by: Elsie Ra   Exercises  Mini Squat with Counter Support - 10 reps - 3 sets - 2x daily - 6x weekly  Backward Walking with Counter Support - 3-5 reps - 1 sets - 2x daily - 6x weekly  Walking Around [place] with Large Steps - 10 reps - 3 sets - 2x daily - 6x weekly  Side Stepping with Counter Support - 3-5 reps - 1 sets - 2x daily - 6x weekly  Standing Tandem Balance with Counter Support - 3 reps - 1 sets - 30 hold - 2x daily - 6x weekly  Standing Single Leg Stance with Counter Support - 10 reps - 3 sets - 2x daily - 6x weekly  Sit to Stand without Arm Support - 10 reps - 1-2 sets - 2x daily - 6x weekly

## 2019-04-19 ENCOUNTER — Other Ambulatory Visit: Payer: Self-pay

## 2019-04-19 ENCOUNTER — Ambulatory Visit: Payer: Medicare Other | Admitting: Physical Therapy

## 2019-04-19 DIAGNOSIS — M6281 Muscle weakness (generalized): Secondary | ICD-10-CM

## 2019-04-19 DIAGNOSIS — R2689 Other abnormalities of gait and mobility: Secondary | ICD-10-CM | POA: Diagnosis not present

## 2019-04-19 DIAGNOSIS — G8929 Other chronic pain: Secondary | ICD-10-CM

## 2019-04-19 DIAGNOSIS — R262 Difficulty in walking, not elsewhere classified: Secondary | ICD-10-CM

## 2019-04-19 DIAGNOSIS — M545 Low back pain: Secondary | ICD-10-CM | POA: Diagnosis not present

## 2019-04-19 NOTE — Therapy (Signed)
[place], [place], [address] Phone: [phone]   Fax:  [phone]  Physical Therapy Treatment  Patient Details  Name: David [name] MRN: [id number] Date of Birth: Feb 17, 1936 Referring Provider (PT): Felecia Shelling, Nanine Means, MD   Encounter Date: 04/19/2019  PT End of Session - 04/19/19 1421    Visit Number  2    Number of Visits  12    Date for PT Re-Evaluation  05/26/19    Authorization Type  BCBS MCR    PT Start Time  0915    PT Stop Time  1000    PT Time Calculation (min)  45 min    Activity Tolerance  Patient tolerated treatment well       Past Medical History:  Diagnosis Date  . CAD (coronary artery disease)    previous stent to LAD 2003  . Cancer ([place])    skin  . Hyperlipidemia   . Hypertension   . LBBB (left bundle branch block)   . Myasthenia gravis ([place])   . S/P cardiac pacemaker procedure, placement 10/24/14 Medtronic Adapta L model ADDRL 1  10/25/2014    Past Surgical History:  Procedure Laterality Date  . CORONARY ANGIOPLASTY WITH STENT PLACEMENT  03/01/2002   stenting to LAD - Dr. Glade Lloyd  . NM MYOCAR PERF WALL MOTION  11/27/2010   normal  . PERMANENT PACEMAKER INSERTION N/A 10/24/2014   Procedure: PERMANENT PACEMAKER INSERTION;  Surgeon: [name], MD;  Location: [facility] CATH LAB;  Service: Cardiovascular;  Laterality: N/A;  . TONSILLECTOMY    . US ECHOCARDIOGRAPHY  12/05/2010   proximal septal thickening,mild concentric LVH,mild deptal hypokinesis,RV mildly dilated,LA mildly dilated,mild AI,moderate aortic root dilatation,septal motion c/w conduction abnormality    There were no vitals filed for this visit.  Subjective Assessment - 04/19/19 0925    Subjective  He relays some soreness and pain down his legs only about 3/10, relays some cramping in his feet at night.         [facility] PT Assessment - 04/19/19 0001      6 minute walk test results    Aerobic Endurance Distance Walked   420   3 min walk test due to fatique                  [facility] Adult PT Treatment/Exercise - 04/19/19 0001      Exercises   Exercises  Other Exercises    Other Exercises   ankle 4 way with yellow for Lt X 15 each, standing hip 3 way with green for Rt leg and yellow for Lt leg X 15 ea, sidestepping with green band around knees without UE support up/down X 3, sit to stand no UE support 2X5             PT Education - 04/19/19 1421    Education Details  HEP additions    Person(s) Educated  Patient    Methods  Explanation;Demonstration;Verbal cues;Handout    Comprehension  Verbalized understanding       PT Short Term Goals - 04/14/19 1638      PT SHORT TERM GOAL #1   Title  Pt will be I and compliant with HEP. 4 weeks 04/14/19    Status  New      PT SHORT TERM GOAL #2   Title  Pt will report no more falls. 4 weeks    Status  New        PT Long Term  Goals - 04/14/19 1640      PT LONG TERM GOAL #1   Title  Pt will improve leg strength to 4+/5 MMT overall to improve function (Target for all goals 8 weeks 05/26/19)    Status  New      PT LONG TERM GOAL #2   Title  Pt will improve BERG to at least 46 to show improved balance.    Baseline  38      PT LONG TERM GOAL #3   Title  Pt will perform 6MWT and will write appropriate goal for this once tested to show improved endurance    Status  New      PT LONG TERM GOAL #4   Title  Pt will report overall improved activity tolerance and decreased pain with prolonged sitting, standing, walking.    Status  New            Plan - 04/19/19 1422    Clinical Impression Statement  Performed 3 MWT to establish baseline for endurance, did not perform 6 minute as he had already walked around the building to find the entrance and he was fatigued. Then session focsued on leg strengthening and this was added into his HEP and he recieved print out of new exercises.    Personal Factors and Comorbidities  Age;Comorbidity  2;Comorbidity 3+    Comorbidities  PMH: CAD, HTN, myasthenia gravis, Pacemaker    Examination-Activity Limitations  Locomotion Level;Transfers;Bend;Sit;Carry;Squat;Stairs;Lift;Stand    Examination-Participation Restrictions  Driving;Community Activity;Yard Work    [employer]chant [employer] officer  Evolving/Moderate complexity    Rehab Potential  Good    PT Frequency  2x / week    PT Duration  8 weeks    PT Treatment/Interventions  ADLs/Self Care Home Management;Aquatic Therapy;Cryotherapy;Dentist;Therapeutic activities;Therapeutic exercise;Balance training;Neuromuscular re-education;Manual techniques;Orthotic Fit/Training;Passive range of motion;Energy conservation;Joint Manipulations;Taping    PT Next Visit Plan  needs balance, general leg strength and conditioning, consider 6MWT and FGA    PT Home Exercise Plan  Access Code: [email or web address]    Consulted and Agree with Plan of Care  Patient       Patient will benefit from skilled therapeutic intervention in order to improve the following deficits and impairments:  Abnormal gait, Cardiopulmonary status limiting activity, Decreased activity tolerance, Decreased balance, Decreased endurance, Decreased range of motion, Decreased strength, Difficulty walking, Increased muscle spasms, Pain, Improper body mechanics, Postural dysfunction  Visit Diagnosis: Other abnormalities of gait and mobility  Difficulty in walking, not elsewhere classified  Chronic bilateral low back pain without sciatica  Muscle weakness (generalized)     Problem List Patient Active Problem List   Diagnosis Date Noted  . Dermatochalasis of both upper eyelids 06/15/2018  . Peroneal neuropathy, left 05/12/2018  . Left foot drop 05/12/2018  . Dysphagia 03/26/2018  . Lumbosacral radiculopathy 02/23/2018  . Facial pain 11/06/2017  . Degenerative lumbar spinal stenosis 09/03/2017  . Vallecular mass 12/31/2016  .  Myasthenia gravis ([place]) 05/01/2016  . Ptosis 05/01/2016  . CAD in native artery 11/03/2015  . OSA (obstructive sleep apnea) 11/03/2015  . S/P cardiac pacemaker procedure, placement 10/24/14 Medtronic Adapta L model ADDRL 1  10/25/2014  . Complete heart block ([place]) 10/24/2014  . CHB (complete heart block), symptomatic 10/24/2014  . LAD stent 2003 10/26/2013  . HTN (hypertension) 10/26/2013  . Hyperlipidemia with target LDL less than 70 10/26/2013  . Mild obesity 10/26/2013  . Spermatic cord mass-left 03/25/2012    Silvestre Mesi 04/19/2019, 2:24 PM  [facility] [place], [place], [address] Phone: [phone]   Fax:  [phone]  Name: David [name] MRN: [id number] Date of Birth: 07/09/1936

## 2019-04-19 NOTE — Patient Instructions (Signed)
Access Code: [email or web address]  URL: [email or web address]  Date: 04/19/2019  Prepared by: Elsie Ra   Exercises Seated Ankle Inversion with Resistance - 10 reps - 1-2 sets - 2x daily - 6x weekly Seated Ankle Dorsiflexion with Anchored Resistance - 10 reps - 1-2 sets - 2x daily - 6x weekly Seated Ankle Eversion with Resistance - 10 reps - 1-2 sets - 2x daily - 6x weekly Seated Eccentric Ankle Plantar Flexion with Resistance - Straight Leg - 10 reps - 1-2 sets - 2x daily - 6x weekly Standing 3-Way Leg Reach with Resistance at Ankles and Unilateral Counter Support - 10 reps - 1-2 sets - 2x daily - 6x weekly Side Stepping with Resistance at Thighs - 10 reps - 1-2 sets - 2x daily - 6x weekly

## 2019-04-21 ENCOUNTER — Other Ambulatory Visit: Payer: Self-pay

## 2019-04-21 ENCOUNTER — Ambulatory Visit: Payer: Medicare Other | Admitting: Physical Therapy

## 2019-04-21 DIAGNOSIS — G8929 Other chronic pain: Secondary | ICD-10-CM

## 2019-04-21 DIAGNOSIS — R2689 Other abnormalities of gait and mobility: Secondary | ICD-10-CM | POA: Diagnosis not present

## 2019-04-21 DIAGNOSIS — M6281 Muscle weakness (generalized): Secondary | ICD-10-CM

## 2019-04-21 DIAGNOSIS — R262 Difficulty in walking, not elsewhere classified: Secondary | ICD-10-CM | POA: Diagnosis not present

## 2019-04-21 DIAGNOSIS — M545 Low back pain, unspecified: Secondary | ICD-10-CM

## 2019-04-21 NOTE — Therapy (Signed)
[place], [place], [address] Phone: [phone]   Fax:  [phone]  Physical Therapy Treatment  Patient Details  Name: David [name] MRN: [id number] Date of Birth: [birth year] Referring Provider (PT): Felecia Shelling, Nanine Means, MD   Encounter Date: 04/21/2019  PT End of Session - 04/21/19 1031    Visit Number  3    Number of Visits  12    Date for PT Re-Evaluation  05/26/19    Authorization Type  [employer] MCR    PT Start Time  0915    PT Stop Time  0955    PT Time Calculation (min)  40 min    Activity Tolerance  Patient tolerated treatment well       Past Medical History:  Diagnosis Date  . CAD (coronary artery disease)    previous stent to LAD 2003  . Cancer ([place])    skin  . Hyperlipidemia   . Hypertension   . LBBB (left bundle branch block)   . Myasthenia gravis ([place])   . S/P cardiac pacemaker procedure, placement 10/24/14 Medtronic Adapta L model ADDRL 1  10/25/2014    Past Surgical History:  Procedure Laterality Date  . CORONARY ANGIOPLASTY WITH STENT PLACEMENT  03/01/2002   stenting to LAD - Dr. Glade Lloyd  . NM MYOCAR PERF WALL MOTION  11/27/2010   normal  . PERMANENT PACEMAKER INSERTION N/A 10/24/2014   Procedure: PERMANENT PACEMAKER INSERTION;  Surgeon: [name], MD;  Location: [facility] CATH LAB;  Service: Cardiovascular;  Laterality: N/A;  . TONSILLECTOMY    . US ECHOCARDIOGRAPHY  12/05/2010   proximal septal thickening,mild concentric LVH,mild deptal hypokinesis,RV mildly dilated,LA mildly dilated,mild AI,moderate aortic root dilatation,septal motion c/w conduction abnormality    There were no vitals filed for this visit.  Subjective Assessment - 04/21/19 1031    Subjective  NO pain today and his legs did not cramp this morning!    Pertinent History  PMH: CAD, HTN, myasthenia gravis, Pacemaker    Limitations  Sitting;Standing;Walking;House hold activities    How long can you sit comfortably?   depends    How long can you stand comfortably?  10 min    How long can you walk comfortably?  longer if he uses cane, depends on hanging onto cart for grocery store distance    Diagnostic tests  lumbar CT    Patient Stated Goals  decrease pain, improve balance, improve strength       Treatment:  nu step 6 min UE/LE for endurance  march walking and retrowalking for balance  heel toe raises but lacks strength for heel raises  4 inch step ups fwd with bilat support X 10 each leg, then lateral step ups on 2 inch X 15 each leg seated clam green X 20  Seated marches 4 lbs X 15 each leg     PT Short Term Goals - 04/14/19 1638      PT SHORT TERM GOAL #1   Title  Pt will be I and compliant with HEP. 4 weeks 04/14/19    Status  New      PT SHORT TERM GOAL #2   Title  Pt will report no more falls. 4 weeks    Status  New        PT Long Term Goals - 04/14/19 1640      PT LONG TERM GOAL #1   Title  Pt will improve leg strength to 4+/5 MMT overall to improve function (  Target for all goals 8 weeks 05/26/19)    Status  New      PT LONG TERM GOAL #2   Title  Pt will improve BERG to at least 46 to show improved balance.    Baseline  38      PT LONG TERM GOAL #3   Title  Pt will perform 6MWT and will write appropriate goal for this once tested to show improved endurance    Status  New      PT LONG TERM GOAL #4   Title  Pt will report overall improved activity tolerance and decreased pain with prolonged sitting, standing, walking.    Status  New            Plan - 04/21/19 1032    Clinical Impression Statement  Progressed his strengthening and balance program with good tolerance but still limited by fatigue. PT will continue to progress this as able.    Personal Factors and Comorbidities  Age;Comorbidity 2;Comorbidity 3+    Comorbidities  PMH: CAD, HTN, myasthenia gravis, Pacemaker    Examination-Activity Limitations  Locomotion  Level;Transfers;Bend;Sit;Carry;Squat;Stairs;Lift;Stand    Examination-Participation Restrictions  Driving;Community Activity;Yard Work    [employer] officer  Evolving/Moderate complexity    Rehab Potential  Good    PT Frequency  2x / week    PT Duration  8 weeks    PT Treatment/Interventions  ADLs/Self Care Home Management;Aquatic Therapy;Cryotherapy;Dentist;Therapeutic activities;Therapeutic exercise;Balance training;Neuromuscular re-education;Manual techniques;Orthotic Fit/Training;Passive range of motion;Energy conservation;Joint Manipulations;Taping    PT Next Visit Plan  needs balance, general leg strength and conditioning, consider 6MWT and FGA    PT Home Exercise Plan  Access Code: [place]    Consulted and Agree with Plan of Care  Patient       Patient will benefit from skilled therapeutic intervention in order to improve the following deficits and impairments:  Abnormal gait, Cardiopulmonary status limiting activity, Decreased activity tolerance, Decreased balance, Decreased endurance, Decreased range of motion, Decreased strength, Difficulty walking, Increased muscle spasms, Pain, Improper body mechanics, Postural dysfunction  Visit Diagnosis: Other abnormalities of gait and mobility  Difficulty in walking, not elsewhere classified  Chronic bilateral low back pain without sciatica  Muscle weakness (generalized)     Problem List Patient Active Problem List   Diagnosis Date Noted  . Dermatochalasis of both upper eyelids 06/15/2018  . Peroneal neuropathy, left 05/12/2018  . Left foot drop 05/12/2018  . Dysphagia 03/26/2018  . Lumbosacral radiculopathy 02/23/2018  . Facial pain 11/06/2017  . Degenerative lumbar spinal stenosis 09/03/2017  . Vallecular mass 12/31/2016  . Myasthenia gravis ([place]) 05/01/2016  . Ptosis 05/01/2016  . CAD in native artery 11/03/2015  . OSA (obstructive sleep apnea)  11/03/2015  . S/P cardiac pacemaker procedure, placement 10/24/14 Medtronic Adapta L model ADDRL 1  10/25/2014  . Complete heart block ([place]) 10/24/2014  . CHB (complete heart block), symptomatic 10/24/2014  . LAD stent 2003 10/26/2013  . HTN (hypertension) 10/26/2013  . Hyperlipidemia with target LDL less than 70 10/26/2013  . Mild obesity 10/26/2013  . Spermatic cord mass-left 03/25/2012    Debbe Odea ,PT,DPT 04/21/2019, 10:33 AM  [facility] [address] [place], [place], [address] Phone: [phone]   Fax:  [phone]  Name: David [name] MRN: [id number] Date of Birth: 12-05-1935

## 2019-04-26 ENCOUNTER — Ambulatory Visit: Payer: Medicare Other | Admitting: Physical Therapy

## 2019-04-26 ENCOUNTER — Other Ambulatory Visit: Payer: Self-pay

## 2019-04-26 DIAGNOSIS — M6281 Muscle weakness (generalized): Secondary | ICD-10-CM

## 2019-04-26 DIAGNOSIS — G8929 Other chronic pain: Secondary | ICD-10-CM | POA: Diagnosis not present

## 2019-04-26 DIAGNOSIS — R262 Difficulty in walking, not elsewhere classified: Secondary | ICD-10-CM

## 2019-04-26 DIAGNOSIS — M545 Low back pain, unspecified: Secondary | ICD-10-CM

## 2019-04-26 DIAGNOSIS — R2689 Other abnormalities of gait and mobility: Secondary | ICD-10-CM | POA: Diagnosis not present

## 2019-04-26 NOTE — Therapy (Signed)
[place], [place], [address] Phone: [phone]   Fax:  [phone]  Physical Therapy Treatment  Patient Details  Name: David [name] MRN: [id number] Date of Birth: January 28, 1936 Referring Provider (PT): Felecia Shelling, Nanine Means, MD   Encounter Date: 04/26/2019  PT End of Session - 04/26/19 1306    Visit Number  4    Number of Visits  12    Date for PT Re-Evaluation  05/26/19    Authorization Type  [employer] MCR    PT Start Time  1215    PT Stop Time  1259    PT Time Calculation (min)  44 min    Activity Tolerance  Patient tolerated treatment well       Past Medical History:  Diagnosis Date  . CAD (coronary artery disease)    previous stent to LAD 2003  . Cancer ([place])    skin  . Hyperlipidemia   . Hypertension   . LBBB (left bundle branch block)   . Myasthenia gravis ([place])   . S/P cardiac pacemaker procedure, placement 10/24/14 Medtronic Adapta L model ADDRL 1  10/25/2014    Past Surgical History:  Procedure Laterality Date  . CORONARY ANGIOPLASTY WITH STENT PLACEMENT  03/01/2002   stenting to LAD - Dr. Glade Lloyd  . NM MYOCAR PERF WALL MOTION  11/27/2010   normal  . PERMANENT PACEMAKER INSERTION N/A 10/24/2014   Procedure: PERMANENT PACEMAKER INSERTION;  Surgeon: [name], MD;  Location: [facility] CATH LAB;  Service: Cardiovascular;  Laterality: N/A;  . TONSILLECTOMY    . US ECHOCARDIOGRAPHY  12/05/2010   proximal septal thickening,mild concentric LVH,mild deptal hypokinesis,RV mildly dilated,LA mildly dilated,mild AI,moderate aortic root dilatation,septal motion c/w conduction abnormality    There were no vitals filed for this visit.  Subjective Assessment - 04/26/19 1304    Subjective  Mild leg pain and cramping, was able to rest this weekend. Does not rate intensity    Pertinent History  PMH: CAD, HTN, myasthenia gravis, Pacemaker    Limitations  Sitting;Standing;Walking;House hold activities    How long  can you sit comfortably?  depends    How long can you stand comfortably?  10 min    How long can you walk comfortably?  longer if he uses cane, depends on hanging onto cart for grocery store distance    Diagnostic tests  lumbar CT    Patient Stated Goals  decrease pain, improve balance, improve strength                       [facility] Adult PT Treatment/Exercise - 04/26/19 0001      Exercises   Exercises  Other Exercises    Other Exercises   Nu step for endurance 7 min L5 UE/LE, then dynamic balance and gait in bars with intermit UE support as needed for walking with head turns up/down and Lt/Rt, then retro walking, march walking, sidestepping all up/down X 3 times. Then standing hip abd, ext, march, H.S. curls all with 3 lbs X 15 bilat each, then sitting LAQ 3 lbs X 20 bilat               PT Short Term Goals - 04/14/19 1638      PT SHORT TERM GOAL #1   Title  Pt will be I and compliant with HEP. 4 weeks 04/14/19    Status  New      PT SHORT TERM GOAL #2  Title  Pt will report no more falls. 4 weeks    Status  New        PT Long Term Goals - 04/14/19 1640      PT LONG TERM GOAL #1   Title  Pt will improve leg strength to 4+/5 MMT overall to improve function (Target for all goals 8 weeks 05/26/19)    Status  New      PT LONG TERM GOAL #2   Title  Pt will improve BERG to at least 46 to show improved balance.    Baseline  38      PT LONG TERM GOAL #3   Title  Pt will perform 6MWT and will write appropriate goal for this once tested to show improved endurance    Status  New      PT LONG TERM GOAL #4   Title  Pt will report overall improved activity tolerance and decreased pain with prolonged sitting, standing, walking.    Status  New            Plan - 04/26/19 1307    Clinical Impression Statement  He is progressing some with leg strength and overall endurance but still has deficits in these areas and needs rest breaks. PT will continue to  progress as tolreated.    Personal Factors and Comorbidities  Age;Comorbidity 2;Comorbidity 3+    Comorbidities  PMH: CAD, HTN, myasthenia gravis, Pacemaker    Examination-Activity Limitations  Locomotion Level;Transfers;Bend;Sit;Carry;Squat;Stairs;Lift;Stand    Examination-Participation Restrictions  Driving;Community Activity;Yard Work    [employer] officer  Evolving/Moderate complexity    Rehab Potential  Good    PT Frequency  2x / week    PT Duration  8 weeks    PT Treatment/Interventions  ADLs/Self Care Home Management;Aquatic Therapy;Cryotherapy;Dentist;Therapeutic activities;Therapeutic exercise;Balance training;Neuromuscular re-education;Manual techniques;Orthotic Fit/Training;Passive range of motion;Energy conservation;Joint Manipulations;Taping    PT Next Visit Plan  needs balance, general leg strength and conditioning, consider 6MWT and FGA    PT Home Exercise Plan  Access Code: [place]    Consulted and Agree with Plan of Care  Patient       Patient will benefit from skilled therapeutic intervention in order to improve the following deficits and impairments:  Abnormal gait, Cardiopulmonary status limiting activity, Decreased activity tolerance, Decreased balance, Decreased endurance, Decreased range of motion, Decreased strength, Difficulty walking, Increased muscle spasms, Pain, Improper body mechanics, Postural dysfunction  Visit Diagnosis: Other abnormalities of gait and mobility  Difficulty in walking, not elsewhere classified  Chronic bilateral low back pain without sciatica  Muscle weakness (generalized)     Problem List Patient Active Problem List   Diagnosis Date Noted  . Dermatochalasis of both upper eyelids 06/15/2018  . Peroneal neuropathy, left 05/12/2018  . Left foot drop 05/12/2018  . Dysphagia 03/26/2018  . Lumbosacral radiculopathy 02/23/2018  . Facial pain 11/06/2017  .  Degenerative lumbar spinal stenosis 09/03/2017  . Vallecular mass 12/31/2016  . Myasthenia gravis ([place]) 05/01/2016  . Ptosis 05/01/2016  . CAD in native artery 11/03/2015  . OSA (obstructive sleep apnea) 11/03/2015  . S/P cardiac pacemaker procedure, placement 10/24/14 Medtronic Adapta L model ADDRL 1  10/25/2014  . Complete heart block ([place]) 10/24/2014  . CHB (complete heart block), symptomatic 10/24/2014  . LAD stent 2003 10/26/2013  . HTN (hypertension) 10/26/2013  . Hyperlipidemia with target LDL less than 70 10/26/2013  . Mild obesity 10/26/2013  . Spermatic cord mass-left 03/25/2012    Silvestre Mesi  04/26/2019, 1:08 PM  [place], [place], [address] Phone: [phone]   Fax:  [phone]  Name: STARLIN BICK MRN: [id number] Date of Birth: August 03, 1935

## 2019-04-28 ENCOUNTER — Ambulatory Visit: Payer: Medicare Other | Admitting: Physical Therapy

## 2019-05-03 ENCOUNTER — Other Ambulatory Visit: Payer: Self-pay

## 2019-05-03 ENCOUNTER — Ambulatory Visit: Payer: Medicare Other | Attending: Neurology | Admitting: Physical Therapy

## 2019-05-03 DIAGNOSIS — M545 Low back pain, unspecified: Secondary | ICD-10-CM

## 2019-05-03 DIAGNOSIS — R262 Difficulty in walking, not elsewhere classified: Secondary | ICD-10-CM | POA: Diagnosis not present

## 2019-05-03 DIAGNOSIS — M6281 Muscle weakness (generalized): Secondary | ICD-10-CM | POA: Diagnosis not present

## 2019-05-03 DIAGNOSIS — R2689 Other abnormalities of gait and mobility: Secondary | ICD-10-CM | POA: Insufficient documentation

## 2019-05-03 DIAGNOSIS — G8929 Other chronic pain: Secondary | ICD-10-CM | POA: Diagnosis not present

## 2019-05-03 NOTE — Therapy (Signed)
[place], [place], [address] Phone: [phone]   Fax:  [phone]  Physical Therapy Treatment  Patient Details  Name: David [name] MRN: [id number] Date of Birth: 07-13-1936 Referring Provider (PT): Britt Bottom, MD   Encounter Date: 05/03/2019  PT End of Session - 05/03/19 1417    Visit Number  5    Number of Visits  12    Date for PT Re-Evaluation  05/26/19    Authorization Type  [employer] MCR    PT Start Time  1219    PT Stop Time  1300    PT Time Calculation (min)  41 min    Activity Tolerance  Patient tolerated treatment well       Past Medical History:  Diagnosis Date  . CAD (coronary artery disease)    previous stent to LAD 2003  . Cancer ([place])    skin  . Hyperlipidemia   . Hypertension   . LBBB (left bundle branch block)   . Myasthenia gravis ([place])   . S/P cardiac pacemaker procedure, placement 10/24/14 Medtronic Adapta L model ADDRL 1  10/25/2014    Past Surgical History:  Procedure Laterality Date  . CORONARY ANGIOPLASTY WITH STENT PLACEMENT  03/01/2002   stenting to LAD - Dr. Glade Lloyd  . NM MYOCAR PERF WALL MOTION  11/27/2010   normal  . PERMANENT PACEMAKER INSERTION N/A 10/24/2014   Procedure: PERMANENT PACEMAKER INSERTION;  Surgeon: [name], MD;  Location: [facility] CATH LAB;  Service: Cardiovascular;  Laterality: N/A;  . TONSILLECTOMY    . US ECHOCARDIOGRAPHY  12/05/2010   proximal septal thickening,mild concentric LVH,mild deptal hypokinesis,RV mildly dilated,LA mildly dilated,mild AI,moderate aortic root dilatation,septal motion c/w conduction abnormality    There were no vitals filed for this visit.  Subjective Assessment - 05/03/19 1416    Subjective  Has not had much pain but has also been resting.    Pertinent History  PMH: CAD, HTN, myasthenia gravis, Pacemaker    Limitations  Sitting;Standing;Walking;House hold activities    How long can you sit comfortably?  depends     How long can you stand comfortably?  10 min    How long can you walk comfortably?  longer if he uses cane, depends on hanging onto cart for grocery store distance    Diagnostic tests  lumbar CT    Patient Stated Goals  decrease pain, improve balance, improve strength       [facility] Adult PT Treatment/Exercise - 05/03/19 0001      Exercises   Other Exercises   Nu step for endurance 7 min L5 UE/LE, Weight machines for general conditioning and strength: leg ext 10 lbs 2X12, leg curl 25 lbs 2X12, leg press 45 lbs 2X12, rows 20 lbs 2x12, chest press 20 lbs 2X12, then dynamic balance and gait in bars with intermit UE support as needed for walking with head turns up/down and Lt/Rt, then retro walking, march walking, sidestepping all up/down X 2 times.  Standing hip abd X 15 bilat       PT Short Term Goals - 04/14/19 1638      PT SHORT TERM GOAL #1   Title  Pt will be I and compliant with HEP. 4 weeks 04/14/19    Status  New      PT SHORT TERM GOAL #2   Title  Pt will report no more falls. 4 weeks    Status  New  PT Long Term Goals - 04/14/19 1640      PT LONG TERM GOAL #1   Title  Pt will improve leg strength to 4+/5 MMT overall to improve function (Target for all goals 8 weeks 05/26/19)    Status  New      PT LONG TERM GOAL #2   Title  Pt will improve BERG to at least 46 to show improved balance.    Baseline  38      PT LONG TERM GOAL #3   Title  Pt will perform 6MWT and will write appropriate goal for this once tested to show improved endurance    Status  New      PT LONG TERM GOAL #4   Title  Pt will report overall improved activity tolerance and decreased pain with prolonged sitting, standing, walking.    Status  New            Plan - 05/03/19 1418    Clinical Impression Statement  Session focused on general strength, conditioning, and balance. Trialed weight machines today as PT plans to transition him to gym upon conclusion of PT. Continue POC.    Personal  Factors and Comorbidities  Age;Comorbidity 2;Comorbidity 3+    Comorbidities  PMH: CAD, HTN, myasthenia gravis, Pacemaker    Examination-Activity Limitations  Locomotion Level;Transfers;Bend;Sit;Carry;Squat;Stairs;Lift;Stand    Examination-Participation Restrictions  Driving;Community Activity;Yard Work    [employer] officer  Evolving/Moderate complexity    Rehab Potential  Good    PT Frequency  2x / week    PT Duration  8 weeks    PT Treatment/Interventions  ADLs/Self Care Home Management;Aquatic Therapy;Cryotherapy;Dentist;Therapeutic activities;Therapeutic exercise;Balance training;Neuromuscular re-education;Manual techniques;Orthotic Fit/Training;Passive range of motion;Energy conservation;Joint Manipulations;Taping    PT Next Visit Plan  needs balance, general leg strength and conditioning, consider 6MWT and FGA    PT Home Exercise Plan  Access Code: [place]    Consulted and Agree with Plan of Care  Patient       Patient will benefit from skilled therapeutic intervention in order to improve the following deficits and impairments:  Abnormal gait, Cardiopulmonary status limiting activity, Decreased activity tolerance, Decreased balance, Decreased endurance, Decreased range of motion, Decreased strength, Difficulty walking, Increased muscle spasms, Pain, Improper body mechanics, Postural dysfunction  Visit Diagnosis: Other abnormalities of gait and mobility  Difficulty in walking, not elsewhere classified  Chronic bilateral low back pain without sciatica  Muscle weakness (generalized)     Problem List Patient Active Problem List   Diagnosis Date Noted  . Dermatochalasis of both upper eyelids 06/15/2018  . Peroneal neuropathy, left 05/12/2018  . Left foot drop 05/12/2018  . Dysphagia 03/26/2018  . Lumbosacral radiculopathy 02/23/2018  . Facial pain 11/06/2017  . Degenerative lumbar spinal stenosis  09/03/2017  . Vallecular mass 12/31/2016  . Myasthenia gravis ([place]) 05/01/2016  . Ptosis 05/01/2016  . CAD in native artery 11/03/2015  . OSA (obstructive sleep apnea) 11/03/2015  . S/P cardiac pacemaker procedure, placement 10/24/14 Medtronic Adapta L model ADDRL 1  10/25/2014  . Complete heart block ([place]) 10/24/2014  . CHB (complete heart block), symptomatic 10/24/2014  . LAD stent 2003 10/26/2013  . HTN (hypertension) 10/26/2013  . Hyperlipidemia with target LDL less than 70 10/26/2013  . Mild obesity 10/26/2013  . Spermatic cord mass-left 03/25/2012    Silvestre Mesi 05/03/2019, 2:20 PM  [facility] [address] [place], [place], [address] Phone: [phone]   Fax:  [phone]  Name: KINGDAVID TODHUNTER MRN: [id number] Date of Birth: [birth year]

## 2019-05-05 ENCOUNTER — Other Ambulatory Visit: Payer: Self-pay

## 2019-05-05 ENCOUNTER — Ambulatory Visit: Payer: Medicare Other | Admitting: Physical Therapy

## 2019-05-05 DIAGNOSIS — M6281 Muscle weakness (generalized): Secondary | ICD-10-CM

## 2019-05-05 DIAGNOSIS — G8929 Other chronic pain: Secondary | ICD-10-CM

## 2019-05-05 DIAGNOSIS — R262 Difficulty in walking, not elsewhere classified: Secondary | ICD-10-CM | POA: Diagnosis not present

## 2019-05-05 DIAGNOSIS — M545 Low back pain: Secondary | ICD-10-CM | POA: Diagnosis not present

## 2019-05-05 DIAGNOSIS — R2689 Other abnormalities of gait and mobility: Secondary | ICD-10-CM | POA: Diagnosis not present

## 2019-05-05 NOTE — Therapy (Signed)
[place], [place], [address] Phone: [phone]   Fax:  [phone]  Physical Therapy Treatment  Patient Details  Name: David [name] MRN: [id number] Date of Birth: [birth year] Referring Provider (PT): Britt Bottom, MD   Encounter Date: 05/05/2019  PT End of Session - 05/05/19 1602    Visit Number  6    Number of Visits  12    Date for PT Re-Evaluation  05/26/19    Authorization Type  BC[employer] MCR    PT Start Time  [id number]    PT Stop Time  [id number]    PT Time Calculation (min)  45 min    Activity Tolerance  Patient tolerated treatment well       Past Medical History:  Diagnosis Date  . CAD (coronary artery disease)    previous stent to LAD 2003  . Cancer ([place])    skin  . Hyperlipidemia   . Hypertension   . LBBB (left bundle branch block)   . Myasthenia gravis ([place])   . S/P cardiac pacemaker procedure, placement 10/24/14 Medtronic Adapta L model ADDRL 1  10/25/2014    Past Surgical History:  Procedure Laterality Date  . CORONARY ANGIOPLASTY WITH STENT PLACEMENT  03/01/2002   stenting to LAD - Dr. Glade Lloyd  . NM MYOCAR PERF WALL MOTION  11/27/2010   normal  . PERMANENT PACEMAKER INSERTION N/A 10/24/2014   Procedure: PERMANENT PACEMAKER INSERTION;  Surgeon: [name], MD;  Location: [facility] CATH LAB;  Service: Cardiovascular;  Laterality: N/A;  . TONSILLECTOMY    . US ECHOCARDIOGRAPHY  12/05/2010   proximal septal thickening,mild concentric LVH,mild deptal hypokinesis,RV mildly dilated,LA mildly dilated,mild AI,moderate aortic root dilatation,septal motion c/w conduction abnormality    There were no vitals filed for this visit.  Subjective Assessment - 05/05/19 1543    Subjective  Relays the pain is better and it is the best his legs have felt in a long time, no pain today                       [facility] Adult PT Treatment/Exercise - 05/05/19 0001      Exercises   Other Exercises   Nu  step for endurance 9 min L6 UE/LE, then seated H.S stretch and standing slantboard stretch 30 sec X 2 ea, then Weight machines for general conditioning and strength: leg ext 10 lbs 2X15, leg curl 25 lbs 2X15, leg press 45 lbs 2X15, rows and lat pull 20 lbs 2x15, chest press 20 lbs 2X12, then dynamic balance and gait in bars with intermit UE support as needed for, then retro walking,sidestepping all up/down X 2 times.                PT Short Term Goals - 04/14/19 1638      PT SHORT TERM GOAL #1   Title  Pt will be I and compliant with HEP. 4 weeks 04/14/19    Status  New      PT SHORT TERM GOAL #2   Title  Pt will report no more falls. 4 weeks    Status  New        PT Long Term Goals - 04/14/19 1640      PT LONG TERM GOAL #1   Title  Pt will improve leg strength to 4+/5 MMT overall to improve function (Target for all goals 8 weeks 05/26/19)    Status  New  PT LONG TERM GOAL #2   Title  Pt will improve BERG to at least 46 to show improved balance.    Baseline  38      PT LONG TERM GOAL #3   Title  Pt will perform 6MWT and will write appropriate goal for this once tested to show improved endurance    Status  New      PT LONG TERM GOAL #4   Title  Pt will report overall improved activity tolerance and decreased pain with prolonged sitting, standing, walking.    Status  New            Plan - 05/05/19 1603    Clinical Impression Statement  Continued with exercises from last visit involving LE stretching and strenghtening and dynamic balance. He appears to be reducing overall pain and slowly improving his strength and endurance. continue POC    Personal Factors and Comorbidities  Age;Comorbidity 2;Comorbidity 3+    Comorbidities  PMH: CAD, HTN, myasthenia gravis, Pacemaker    Examination-Activity Limitations  Locomotion Level;Transfers;Bend;Sit;Carry;Squat;Stairs;Lift;Stand    Examination-Participation Restrictions  Driving;Community Activity;Yard Work     [employer]  Evolving/Moderate complexity    Rehab Potential  Good    PT Frequency  2x / week    PT Duration  8 weeks    PT Treatment/Interventions  ADLs/Self Care Home Management;Aquatic Therapy;Cryotherapy;Dentist;Therapeutic activities;Therapeutic exercise;Balance training;Neuromuscular re-education;Manual techniques;Orthotic Fit/Training;Passive range of motion;Energy conservation;Joint Manipulations;Taping    PT Next Visit Plan  needs balance, general leg strength and conditioning, consider 6MWT and FGA    PT Home Exercise Plan  Access Code: [place]    Consulted and Agree with Plan of Care  Patient       Patient will benefit from skilled therapeutic intervention in order to improve the following deficits and impairments:  Abnormal gait, Cardiopulmonary status limiting activity, Decreased activity tolerance, Decreased balance, Decreased endurance, Decreased range of motion, Decreased strength, Difficulty walking, Increased muscle spasms, Pain, Improper body mechanics, Postural dysfunction  Visit Diagnosis: Other abnormalities of gait and mobility  Difficulty in walking, not elsewhere classified  Chronic bilateral low back pain without sciatica  Muscle weakness (generalized)     Problem List Patient Active Problem List   Diagnosis Date Noted  . Dermatochalasis of both upper eyelids 06/15/2018  . Peroneal neuropathy, left 05/12/2018  . Left foot drop 05/12/2018  . Dysphagia 03/26/2018  . Lumbosacral radiculopathy 02/23/2018  . Facial pain 11/06/2017  . Degenerative lumbar spinal stenosis 09/03/2017  . Vallecular mass 12/31/2016  . Myasthenia gravis ([place]) 05/01/2016  . Ptosis 05/01/2016  . CAD in native artery 11/03/2015  . OSA (obstructive sleep apnea) 11/03/2015  . S/P cardiac pacemaker procedure, placement 10/24/14 Medtronic Adapta L model ADDRL 1  10/25/2014  . Complete heart block ([place])  10/24/2014  . CHB (complete heart block), symptomatic 10/24/2014  . LAD stent 2003 10/26/2013  . HTN (hypertension) 10/26/2013  . Hyperlipidemia with target LDL less than 70 10/26/2013  . Mild obesity 10/26/2013  . Spermatic cord mass-left 03/25/2012    Silvestre Mesi 05/05/2019, 4:05 PM  [facility] [address] [place], [place], [address] Phone: [phone]   Fax:  [phone]  Name: David [name] MRN: [id number] Date of Birth: 06-Oct-1935

## 2019-05-10 ENCOUNTER — Other Ambulatory Visit: Payer: Self-pay | Admitting: Urology

## 2019-05-10 ENCOUNTER — Ambulatory Visit: Payer: Medicare Other | Admitting: Physical Therapy

## 2019-05-10 ENCOUNTER — Other Ambulatory Visit: Payer: Self-pay

## 2019-05-10 DIAGNOSIS — M6281 Muscle weakness (generalized): Secondary | ICD-10-CM

## 2019-05-10 DIAGNOSIS — G8929 Other chronic pain: Secondary | ICD-10-CM | POA: Diagnosis not present

## 2019-05-10 DIAGNOSIS — M545 Low back pain: Secondary | ICD-10-CM | POA: Diagnosis not present

## 2019-05-10 DIAGNOSIS — R2689 Other abnormalities of gait and mobility: Secondary | ICD-10-CM | POA: Diagnosis not present

## 2019-05-10 DIAGNOSIS — R262 Difficulty in walking, not elsewhere classified: Secondary | ICD-10-CM

## 2019-05-10 NOTE — Therapy (Signed)
[place], [place], [address] Phone: [phone]   Fax:  [phone]  Physical Therapy Treatment  Patient Details  Name: David [name] MRN: [id number] Date of Birth: [birth year] Referring Provider (PT): Felecia Shelling, Nanine Means, MD   Encounter Date: 05/10/2019  PT End of Session - 05/10/19 1445    Visit Number  7    Number of Visits  12    Date for PT Re-Evaluation  05/26/19    Authorization Type  BCBS MCR    PT Start Time  1400    PT Stop Time  1445    PT Time Calculation (min)  45 min    Activity Tolerance  Patient tolerated treatment well    Behavior During Therapy  [facility] for tasks assessed/performed       Past Medical History:  Diagnosis Date  . CAD (coronary artery disease)    previous stent to LAD 2003  . Cancer ([place])    skin  . Hyperlipidemia   . Hypertension   . LBBB (left bundle branch block)   . Myasthenia gravis ([place])   . S/P cardiac pacemaker procedure, placement 10/24/14 Medtronic Adapta L model ADDRL 1  10/25/2014    Past Surgical History:  Procedure Laterality Date  . CORONARY ANGIOPLASTY WITH STENT PLACEMENT  03/01/2002   stenting to LAD - Dr. Glade Lloyd  . NM MYOCAR PERF WALL MOTION  11/27/2010   normal  . PERMANENT PACEMAKER INSERTION N/A 10/24/2014   Procedure: PERMANENT PACEMAKER INSERTION;  Surgeon: [name], MD;  Location: [facility] CATH LAB;  Service: Cardiovascular;  Laterality: N/A;  . TONSILLECTOMY    . US ECHOCARDIOGRAPHY  12/05/2010   proximal septal thickening,mild concentric LVH,mild deptal hypokinesis,RV mildly dilated,LA mildly dilated,mild AI,moderate aortic root dilatation,septal motion c/w conduction abnormality    There were no vitals filed for this visit.  Subjective Assessment - 05/10/19 1428    Subjective  He relays legs feel okay, only 1 to 2/10 knee pain.            [facility] Adult PT Treatment/Exercise - 05/10/19 0001      Exercises   Other Exercises   Nu  step for endurance 10 min L6 UE/LE, then seated H.S stretch and standing slantboard stretch 30 sec X 2 ea, then Weight machines for general conditioning and strength: leg ext 10 lbs 2X15, leg curl 25 lbs 2X15, leg press 55 lbs 2X12, rows and lat pull 20 lbs 2x15, chest press 20 lbs 2X12, then dynamic balance and gait in bars with intermit UE support as needed for, march walk,then retro walking,sidestepping all up/down X 2 times. Then airex balance with feet together for 90 sec               PT Short Term Goals - 04/14/19 1638      PT SHORT TERM GOAL #1   Title  Pt will be I and compliant with HEP. 4 weeks 04/14/19    Status  New      PT SHORT TERM GOAL #2   Title  Pt will report no more falls. 4 weeks    Status  New        PT Long Term Goals - 04/14/19 1640      PT LONG TERM GOAL #1   Title  Pt will improve leg strength to 4+/5 MMT overall to improve function (Target for all goals 8 weeks 05/26/19)    Status  New      PT  LONG TERM GOAL #2   Title  Pt will improve BERG to at least 46 to show improved balance.    Baseline  38      PT LONG TERM GOAL #3   Title  Pt will perform 6MWT and will write appropriate goal for this once tested to show improved endurance    Status  New      PT LONG TERM GOAL #4   Title  Pt will report overall improved activity tolerance and decreased pain with prolonged sitting, standing, walking.    Status  New            Plan - 05/10/19 1446    Clinical Impression Statement  Progressed exercises and balance as tolerated. He did require less breaks today. PT will continue to progress as able    Personal Factors and Comorbidities  Age;Comorbidity 2;Comorbidity 3+    Comorbidities  PMH: CAD, HTN, myasthenia gravis, Pacemaker    Examination-Activity Limitations  Locomotion Level;Transfers;Bend;Sit;Carry;Squat;Stairs;Lift;Stand    Examination-Participation Restrictions  Driving;Community Activity;Yard Work    [employer] officer   Evolving/Moderate complexity    Rehab Potential  Good    PT Frequency  2x / week    PT Duration  8 weeks    PT Treatment/Interventions  ADLs/Self Care Home Management;Aquatic Therapy;Cryotherapy;Dentist;Therapeutic activities;Therapeutic exercise;Balance training;Neuromuscular re-education;Manual techniques;Orthotic Fit/Training;Passive range of motion;Energy conservation;Joint Manipulations;Taping    PT Next Visit Plan  needs balance, general leg strength and conditioning, consider 6MWT and FGA    PT Home Exercise Plan  Access Code: [place]    Consulted and Agree with Plan of Care  Patient       Patient will benefit from skilled therapeutic intervention in order to improve the following deficits and impairments:  Abnormal gait, Cardiopulmonary status limiting activity, Decreased activity tolerance, Decreased balance, Decreased endurance, Decreased range of motion, Decreased strength, Difficulty walking, Increased muscle spasms, Pain, Improper body mechanics, Postural dysfunction  Visit Diagnosis: Other abnormalities of gait and mobility  Difficulty in walking, not elsewhere classified  Muscle weakness (generalized)     Problem List Patient Active Problem List   Diagnosis Date Noted  . Dermatochalasis of both upper eyelids 06/15/2018  . Peroneal neuropathy, left 05/12/2018  . Left foot drop 05/12/2018  . Dysphagia 03/26/2018  . Lumbosacral radiculopathy 02/23/2018  . Facial pain 11/06/2017  . Degenerative lumbar spinal stenosis 09/03/2017  . Vallecular mass 12/31/2016  . Myasthenia gravis ([place]) 05/01/2016  . Ptosis 05/01/2016  . CAD in native artery 11/03/2015  . OSA (obstructive sleep apnea) 11/03/2015  . S/P cardiac pacemaker procedure, placement 10/24/14 Medtronic Adapta L model ADDRL 1  10/25/2014  . Complete heart block ([place]) 10/24/2014  . CHB (complete heart block), symptomatic 10/24/2014  . LAD stent 2003  10/26/2013  . HTN (hypertension) 10/26/2013  . Hyperlipidemia with target LDL less than 70 10/26/2013  . Mild obesity 10/26/2013  . Spermatic cord mass-left 03/25/2012    Debbe Odea ,PT,DPT 05/10/2019, 3:03 PM  [facility] [address] [place], [place], [address] Phone: [phone]   Fax:  [phone]  Name: David [name] MRN: [id number] Date of Birth: 01/23/1936

## 2019-05-12 ENCOUNTER — Ambulatory Visit: Payer: Medicare Other | Admitting: Physical Therapy

## 2019-05-12 ENCOUNTER — Other Ambulatory Visit: Payer: Self-pay

## 2019-05-12 ENCOUNTER — Encounter: Payer: Self-pay | Admitting: Physical Therapy

## 2019-05-12 DIAGNOSIS — R262 Difficulty in walking, not elsewhere classified: Secondary | ICD-10-CM

## 2019-05-12 DIAGNOSIS — M6281 Muscle weakness (generalized): Secondary | ICD-10-CM | POA: Diagnosis not present

## 2019-05-12 DIAGNOSIS — M545 Low back pain: Secondary | ICD-10-CM | POA: Diagnosis not present

## 2019-05-12 DIAGNOSIS — R2689 Other abnormalities of gait and mobility: Secondary | ICD-10-CM | POA: Diagnosis not present

## 2019-05-12 DIAGNOSIS — G8929 Other chronic pain: Secondary | ICD-10-CM | POA: Diagnosis not present

## 2019-05-12 NOTE — Therapy (Signed)
[place], [place], [address] Phone: [phone]   Fax:  [phone]  Physical Therapy Treatment  Patient Details  Name: David [name] MRN: [id number] Date of Birth: 1936/01/30 Referring Provider (PT): Felecia Shelling, Nanine Means, MD   Encounter Date: 05/12/2019  PT End of Session - 05/12/19 1352    Visit Number  8    Number of Visits  12    Date for PT Re-Evaluation  05/26/19    Authorization Type  BCBS MCR    PT Start Time  1400    PT Stop Time  1445    PT Time Calculation (min)  45 min    Activity Tolerance  Patient tolerated treatment well    Behavior During Therapy  [facility] for tasks assessed/performed       Past Medical History:  Diagnosis Date  . CAD (coronary artery disease)    previous stent to LAD 2003  . Cancer ([place])    skin  . Hyperlipidemia   . Hypertension   . LBBB (left bundle branch block)   . Myasthenia gravis ([place])   . S/P cardiac pacemaker procedure, placement 10/24/14 Medtronic Adapta L model ADDRL 1  10/25/2014    Past Surgical History:  Procedure Laterality Date  . CORONARY ANGIOPLASTY WITH STENT PLACEMENT  03/01/2002   stenting to LAD - Dr. Glade Lloyd  . NM MYOCAR PERF WALL MOTION  11/27/2010   normal  . PERMANENT PACEMAKER INSERTION N/A 10/24/2014   Procedure: PERMANENT PACEMAKER INSERTION;  Surgeon: [name], MD;  Location: [facility] CATH LAB;  Service: Cardiovascular;  Laterality: N/A;  . TONSILLECTOMY    . US ECHOCARDIOGRAPHY  12/05/2010   proximal septal thickening,mild concentric LVH,mild deptal hypokinesis,RV mildly dilated,LA mildly dilated,mild AI,moderate aortic root dilatation,septal motion c/w conduction abnormality    There were no vitals filed for this visit.  Subjective Assessment - 05/12/19 1347    Subjective  Patient reports he continues to improve. He reports his calf and foot cramping is better today, his left is a little worse than right.    Pertinent History  PMH:  CAD, HTN, myasthenia gravis, Pacemaker    Limitations  Sitting;Standing;Walking;House hold activities    How long can you sit comfortably?  depends    How long can you stand comfortably?  10 min    How long can you walk comfortably?  longer if he uses cane, depends on hanging onto cart for grocery store distance    Diagnostic tests  lumbar CT    Patient Stated Goals  decrease pain, improve balance, improve strength    Currently in Pain?  No/denies                       [facility] Adult PT Treatment/Exercise - 05/12/19 0001      Exercises   Other Exercises   Nu step for endurance 10 min at L6 with UE/LE (seat 10), Leg press 65 lbs 2X15, Knee ext 25 lbs 2X15, Hamstring curl 55 lbs 2X15, Lateral band walk with yellow band around knees using parallel bars for balance 5x10 bilat, Fwd step-up x10 each, Lateral step-up x10 each, Tandem walking in balance bars 2x20 ft, Tandem balance in balance bars 2x30 sec each             PT Education - 05/12/19 1351    Education Details  HEP    Person(s) Educated  Patient    Methods  Explanation;Demonstration;Verbal cues    Comprehension  Verbalized understanding;Need further instruction       PT Short Term Goals - 05/12/19 1504      PT SHORT TERM GOAL #1   Title  Pt will be I and compliant with HEP. 4 weeks 04/14/19    Status  On-going      PT SHORT TERM GOAL #2   Title  Pt will report no more falls. 4 weeks    Status  Achieved        PT Long Term Goals - 04/14/19 1640      PT LONG TERM GOAL #1   Title  Pt will improve leg strength to 4+/5 MMT overall to improve function (Target for all goals 8 weeks 05/26/19)    Status  New      PT LONG TERM GOAL #2   Title  Pt will improve BERG to at least 46 to show improved balance.    Baseline  38      PT LONG TERM GOAL #3   Title  Pt will perform 6MWT and will write appropriate goal for this once tested to show improved endurance    Status  New      PT LONG TERM GOAL #4   Title   Pt will report overall improved activity tolerance and decreased pain with prolonged sitting, standing, walking.    Status  New            Plan - 05/12/19 1352    Clinical Impression Statement  Patient tolerated therapy well and he continues to progress well with his strengthening and balance exercises. He continues to require frequent rest/water breaks during therapy and exhibits LE weakness (R>L) and balance deficits which require his use of a SPC with ambulation. He is tolerating more difficult execises and greater resistance with no report of increased pain. He would benefit from continued skilled PT to further progress strength and balance to maximize his functional ability.    Personal Factors and Comorbidities  Age;Comorbidity 3+    Comorbidities  PMH: CAD, HTN, myasthenia gravis, Pacemaker    Examination-Activity Limitations  Locomotion Level;Transfers;Bend;Sit;Carry;Squat;Stairs;Lift;Stand    Examination-Participation Restrictions  Driving;Community Activity;Yard Work    Stability/Clinical Decision Making  Evolving/Moderate complexity    Clinical Decision Making  --    Rehab Potential  Good    PT Frequency  2x / week    PT Duration  8 weeks    PT Treatment/Interventions  ADLs/Self Care Home Management;Aquatic Therapy;Cryotherapy;Dentist;Therapeutic activities;Therapeutic exercise;Balance training;Neuromuscular re-education;Manual techniques;Orthotic Fit/Training;Passive range of motion;Energy conservation;Joint Manipulations;Taping    PT Next Visit Plan  needs balance, general leg strength and conditioning, consider 6MWT and FGA    PT Home Exercise Plan  Access Code: [place]    Consulted and Agree with Plan of Care  Patient       Patient will benefit from skilled therapeutic intervention in order to improve the following deficits and impairments:  Abnormal gait, Cardiopulmonary status limiting activity, Decreased activity  tolerance, Decreased balance, Decreased endurance, Decreased range of motion, Decreased strength, Difficulty walking, Increased muscle spasms, Pain, Improper body mechanics, Postural dysfunction  Visit Diagnosis: Other abnormalities of gait and mobility  Difficulty in walking, not elsewhere classified  Muscle weakness (generalized)     Problem List Patient Active Problem List   Diagnosis Date Noted  . Dermatochalasis of both upper eyelids 06/15/2018  . Peroneal neuropathy, left 05/12/2018  . Left foot drop 05/12/2018  . Dysphagia 03/26/2018  . Lumbosacral radiculopathy 02/23/2018  . Facial pain  11/06/2017  . Degenerative lumbar spinal stenosis 09/03/2017  . Vallecular mass 12/31/2016  . Myasthenia gravis ([place]) 05/01/2016  . Ptosis 05/01/2016  . CAD in native artery 11/03/2015  . OSA (obstructive sleep apnea) 11/03/2015  . S/P cardiac pacemaker procedure, placement 10/24/14 Medtronic Adapta L model ADDRL 1  10/25/2014  . Complete heart block ([place]) 10/24/2014  . CHB (complete heart block), symptomatic 10/24/2014  . LAD stent 2003 10/26/2013  . HTN (hypertension) 10/26/2013  . Hyperlipidemia with target LDL less than 70 10/26/2013  . Mild obesity 10/26/2013  . Spermatic cord mass-left 03/25/2012    Hilda Blades, PT, DPT, LAT, ATC 05/12/19  3:52 PM Phone: [phone] Fax: [facility] [address] [place], [place], [address] Phone: [phone]   Fax:  [phone]  Name: David [name] MRN: [id number] Date of Birth: 10-16-1935

## 2019-05-17 ENCOUNTER — Ambulatory Visit: Payer: Medicare Other | Admitting: Physical Therapy

## 2019-05-17 ENCOUNTER — Encounter: Payer: Self-pay | Admitting: Physical Therapy

## 2019-05-17 ENCOUNTER — Other Ambulatory Visit: Payer: Self-pay

## 2019-05-17 DIAGNOSIS — M6281 Muscle weakness (generalized): Secondary | ICD-10-CM

## 2019-05-17 DIAGNOSIS — M545 Low back pain: Secondary | ICD-10-CM | POA: Diagnosis not present

## 2019-05-17 DIAGNOSIS — R262 Difficulty in walking, not elsewhere classified: Secondary | ICD-10-CM

## 2019-05-17 DIAGNOSIS — G8929 Other chronic pain: Secondary | ICD-10-CM | POA: Diagnosis not present

## 2019-05-17 DIAGNOSIS — R2689 Other abnormalities of gait and mobility: Secondary | ICD-10-CM | POA: Diagnosis not present

## 2019-05-17 NOTE — Therapy (Signed)
[place], [place], [address] Phone: [phone]   Fax:  [phone]  Physical Therapy Treatment  Patient Details  Name: David [name] MRN: [id number] Date of Birth: [birth year] Referring Provider (PT): Britt Bottom, MD   Encounter Date: 05/17/2019  PT End of Session - 05/17/19 1409    Visit Number  9    Number of Visits  12    Date for PT Re-Evaluation  05/26/19    Authorization Type  BCBS MCR    PT Start Time  1400    PT Stop Time  1445    PT Time Calculation (min)  45 min    Activity Tolerance  Patient tolerated treatment well    Behavior During Therapy  [facility] for tasks assessed/performed       Past Medical History:  Diagnosis Date  . CAD (coronary artery disease)    previous stent to LAD 2003  . Cancer ([place])    skin  . Hyperlipidemia   . Hypertension   . LBBB (left bundle branch block)   . Myasthenia gravis ([place])   . S/P cardiac pacemaker procedure, placement 10/24/14 Medtronic Adapta L model ADDRL 1  10/25/2014    Past Surgical History:  Procedure Laterality Date  . CORONARY ANGIOPLASTY WITH STENT PLACEMENT  03/01/2002   stenting to LAD - Dr. Glade Lloyd  . NM MYOCAR PERF WALL MOTION  11/27/2010   normal  . PERMANENT PACEMAKER INSERTION N/A 10/24/2014   Procedure: PERMANENT PACEMAKER INSERTION;  Surgeon: [name], MD;  Location: [facility] CATH LAB;  Service: Cardiovascular;  Laterality: N/A;  . TONSILLECTOMY    . US ECHOCARDIOGRAPHY  12/05/2010   proximal septal thickening,mild concentric LVH,mild deptal hypokinesis,RV mildly dilated,LA mildly dilated,mild AI,moderate aortic root dilatation,septal motion c/w conduction abnormality    There were no vitals filed for this visit.  Subjective Assessment - 05/17/19 1400    Subjective  Patient reports his right hip and knee were sore from the previous appointment. He thinks adding weight to the leg press exercise caused him to be sore. Otherwise he  is doing well. He notes his feet are still numb and cold at night.    Currently in Pain?  Yes    Pain Score  3     Pain Location  Hip    Pain Orientation  Right    Pain Descriptors / Indicators  Sore    Pain Type  Chronic pain    Pain Onset  More than a month ago    Pain Frequency  Intermittent                       [facility] Adult PT Treatment/Exercise - 05/17/19 0001      Neuro Re-ed    Neuro Re-ed Details   Tandem balance 3x30 sec each   in balance bars     Exercises   Exercises  Lumbar;Knee/Hip      Lumbar Exercises: Aerobic   Nustep  NuStep x10 min at Level 5      Lumbar Exercises: Machines for Strengthening   Cybex Knee Extension  3x15 25#    Cybex Knee Flexion  3x15 45#      Lumbar Exercises: Standing   Other Standing Lumbar Exercises  Lateral band walk with yellow band around knees 3x10   in balance bars   Other Standing Lumbar Exercises  Forward step-up using 8" step 2x10 each   use of contralateral UE for balance  and support     Knee/Hip Exercises: Standing   Hip Flexion  10 reps;2 sets    Hip Flexion Limitations  Marching, use of UE for balance    Hip Abduction  10 reps;2 sets    Abduction Limitations  Use of UE for balance    Hip Extension  10 reps;2 sets    Extension Limitations  Use of UE for balance             PT Education - 05/17/19 1408    Education Details  HEP    Person(s) Educated  Patient    Methods  Explanation;Demonstration;Verbal cues    Comprehension  Verbalized understanding;Verbal cues required       PT Short Term Goals - 05/12/19 1504      PT SHORT TERM GOAL #1   Title  Pt will be I and compliant with HEP. 4 weeks 04/14/19    Status  On-going      PT SHORT TERM GOAL #2   Title  Pt will report no more falls. 4 weeks    Status  Achieved        PT Long Term Goals - 04/14/19 1640      PT LONG TERM GOAL #1   Title  Pt will improve leg strength to 4+/5 MMT overall to improve function (Target for all goals 8  weeks 05/26/19)    Status  New      PT LONG TERM GOAL #2   Title  Pt will improve BERG to at least 46 to show improved balance.    Baseline  38      PT LONG TERM GOAL #3   Title  Pt will perform 6MWT and will write appropriate goal for this once tested to show improved endurance    Status  New      PT LONG TERM GOAL #4   Title  Pt will report overall improved activity tolerance and decreased pain with prolonged sitting, standing, walking.    Status  New            Plan - 05/17/19 1410    Clinical Impression Statement  Patient tolerated therapy well with an adverse effects. The strengthening resistance was reduced this visit as patient reported increased right hip and knee pain following last visit. He did well with exercises this visit but continues to exhibit impaired balance and strength and requires frequent rest breaks. He reports consistency with exercise at home and he feels he would be ready to transition to independent HEP after his last scheduled visit on 05/26/2019. He would benefit from continued skilled PT to improve his strength and balance to maximize his functional level.    PT Treatment/Interventions  ADLs/Self Care Home Management;Aquatic Therapy;Cryotherapy;Dentist;Therapeutic activities;Therapeutic exercise;Balance training;Neuromuscular re-education;Manual techniques;Orthotic Fit/Training;Passive range of motion;Energy conservation;Joint Manipulations;Taping    PT Next Visit Plan  progress balance and general LE strength    PT Home Exercise Plan  No change to current HEP    Consulted and Agree with Plan of Care  Patient       Patient will benefit from skilled therapeutic intervention in order to improve the following deficits and impairments:  Abnormal gait, Cardiopulmonary status limiting activity, Decreased activity tolerance, Decreased balance, Decreased endurance, Decreased range of motion, Decreased  strength, Difficulty walking, Increased muscle spasms, Pain, Improper body mechanics, Postural dysfunction  Visit Diagnosis: Muscle weakness (generalized)  Other abnormalities of gait and mobility  Difficulty in walking, not elsewhere classified  Problem List Patient Active Problem List   Diagnosis Date Noted  . Dermatochalasis of both upper eyelids 06/15/2018  . Peroneal neuropathy, left 05/12/2018  . Left foot drop 05/12/2018  . Dysphagia 03/26/2018  . Lumbosacral radiculopathy 02/23/2018  . Facial pain 11/06/2017  . Degenerative lumbar spinal stenosis 09/03/2017  . Vallecular mass 12/31/2016  . Myasthenia gravis ([place]) 05/01/2016  . Ptosis 05/01/2016  . CAD in native artery 11/03/2015  . OSA (obstructive sleep apnea) 11/03/2015  . S/P cardiac pacemaker procedure, placement 10/24/14 Medtronic Adapta L model ADDRL 1  10/25/2014  . Complete heart block ([place]) 10/24/2014  . CHB (complete heart block), symptomatic 10/24/2014  . LAD stent 2003 10/26/2013  . HTN (hypertension) 10/26/2013  . Hyperlipidemia with target LDL less than 70 10/26/2013  . Mild obesity 10/26/2013  . Spermatic cord mass-left 03/25/2012   Hilda Blades, PT, DPT, LAT, ATC 05/17/19  3:51 PM Phone: [phone] Fax: [facility] [address] [place], [place], [address] Phone: [phone]   Fax:  [phone]  Name: David [name] MRN: [id number] Date of Birth: 1936-07-09

## 2019-05-19 ENCOUNTER — Other Ambulatory Visit: Payer: Self-pay

## 2019-05-19 ENCOUNTER — Ambulatory Visit: Payer: Medicare Other | Admitting: Physical Therapy

## 2019-05-19 ENCOUNTER — Encounter: Payer: Self-pay | Admitting: Physical Therapy

## 2019-05-19 DIAGNOSIS — G8929 Other chronic pain: Secondary | ICD-10-CM | POA: Diagnosis not present

## 2019-05-19 DIAGNOSIS — M6281 Muscle weakness (generalized): Secondary | ICD-10-CM | POA: Diagnosis not present

## 2019-05-19 DIAGNOSIS — R2689 Other abnormalities of gait and mobility: Secondary | ICD-10-CM | POA: Diagnosis not present

## 2019-05-19 DIAGNOSIS — R262 Difficulty in walking, not elsewhere classified: Secondary | ICD-10-CM

## 2019-05-19 DIAGNOSIS — M545 Low back pain: Secondary | ICD-10-CM | POA: Diagnosis not present

## 2019-05-19 NOTE — Therapy (Signed)
[place], [place], [address] Phone: [phone]   Fax:  [phone]  Physical Therapy Treatment  Patient Details  Name: David [name] MRN: [id number] Date of Birth: Sep 02, 1935 Referring Provider (PT): Felecia Shelling, Nanine Means, MD   Encounter Date: 05/19/2019  PT End of Session - 05/19/19 1355    Visit Number  10    Number of Visits  12    Date for PT Re-Evaluation  05/26/19    Authorization Type  BCBS MCR    PT Start Time  7408    PT Stop Time  1440    PT Time Calculation (min)  45 min    Activity Tolerance  Patient tolerated treatment well    Behavior During Therapy  [facility] for tasks assessed/performed       Past Medical History:  Diagnosis Date  . CAD (coronary artery disease)    previous stent to LAD 2003  . Cancer ([place])    skin  . Hyperlipidemia   . Hypertension   . LBBB (left bundle branch block)   . Myasthenia gravis ([place])   . S/P cardiac pacemaker procedure, placement 10/24/14 Medtronic Adapta L model ADDRL 1  10/25/2014    Past Surgical History:  Procedure Laterality Date  . CORONARY ANGIOPLASTY WITH STENT PLACEMENT  03/01/2002   stenting to LAD - Dr. Glade Lloyd  . NM MYOCAR PERF WALL MOTION  11/27/2010   normal  . PERMANENT PACEMAKER INSERTION N/A 10/24/2014   Procedure: PERMANENT PACEMAKER INSERTION;  Surgeon: [name], MD;  Location: [facility] CATH LAB;  Service: Cardiovascular;  Laterality: N/A;  . TONSILLECTOMY    . US ECHOCARDIOGRAPHY  12/05/2010   proximal septal thickening,mild concentric LVH,mild deptal hypokinesis,RV mildly dilated,LA mildly dilated,mild AI,moderate aortic root dilatation,septal motion c/w conduction abnormality    There were no vitals filed for this visit.  Subjective Assessment - 05/19/19 1520    Subjective  Patient reports he is improved and is feeling better than last visit.    Currently in Pain?  No/denies         [facility] PT Assessment - 05/19/19 0001      Balance Screen   Has the patient fallen in the past 6 months  --   Patient reports he has not fallen since starting therapy     Ambulation/Gait   Ambulation Distance (Feet)  768 Feet      6 Minute Walk- Baseline   6 Minute Walk- Baseline  --      6 Minute walk- Post Test   6 Minute Walk Post Test  --      6 minute walk test results    Aerobic Endurance Distance Walked  768    Endurance additional comments  Patient used SPC on right                   [facility] Adult PT Treatment/Exercise - 05/19/19 0001      Ambulation/Gait   Ambulation/Gait  Yes    Ambulation/Gait Assistance  6: Modified independent (Device/Increase time)    Assistive device  Straight cane   on right   Gait Pattern  Step-through pattern;Decreased hip/knee flexion - right;Decreased hip/knee flexion - left;Lateral trunk lean to right;Trendelenburg    Ambulation Surface  Level      Neuro Re-ed    Neuro Re-ed Details   Rhomberg with eyes closed 3x20 sec, Tandem balance 3x20 sec each, tandem walking 2x20 ft      Exercises   Exercises  Lumbar;Knee/Hip      Lumbar Exercises: Aerobic   Nustep  NuStep x8 min at Level 6, UE and LE      Lumbar Exercises: Machines for Strengthening   Cybex Knee Extension  3x15 25#    Cybex Knee Flexion  3x15 45#    Leg Press  3x12 45#      Lumbar Exercises: Standing   Heel Raises  10 reps   2 sets   Other Standing Lumbar Exercises  Lateral band walk with yellow band around knees 3x10    Other Standing Lumbar Exercises  --      Knee/Hip Exercises: Standing   Hip Flexion  10 reps;2 sets    Hip Flexion Limitations  Marching, use of UE for balance    Hip Extension  10 reps;2 sets    Extension Limitations  Use of UE for balance             PT Education - 05/19/19 1355    Education Details  HEP, working on balance at home    Person(s) Educated  Patient    Methods  Explanation;Demonstration;Verbal cues    Comprehension  Verbalized understanding       PT  Short Term Goals - 05/19/19 1357      PT SHORT TERM GOAL #1   Title  Pt will be I and compliant with HEP. 4 weeks 04/14/19    Status  Achieved      PT SHORT TERM GOAL #2   Title  Pt will report no more falls. 4 weeks    Status  Achieved        PT Long Term Goals - 05/19/19 1357      PT LONG TERM GOAL #1   Title  Pt will improve leg strength to 4+/5 MMT overall to improve function (Target for all goals 8 weeks 05/26/19)    Status  On-going      PT LONG TERM GOAL #2   Title  Pt will improve BERG to at least 46 to show improved balance.    Baseline  38    Status  On-going      PT LONG TERM GOAL #3   Title  Pt will perform 6MWT to 800 ft with use of SPC on right.    Status  Partially Met      PT LONG TERM GOAL #4   Title  Pt will report overall improved activity tolerance and decreased pain with prolonged sitting, standing, walking.    Status  On-going            Plan - 05/19/19 1356    Clinical Impression Statement  Patient tolerated therapy well with no adverse effects. He was able to complete a 6 minute walk test this visit which indicated improved endurance compared to previous assessment. He is progressing well with his strength and balance and will be reassessed at a future visit. He continues to require rest breaks due to fatigue. He would benefit from continued skilled PT to improve his strength and balance to maximize his functional level, and anticipate next week being his last week in therapy.    PT Treatment/Interventions  ADLs/Self Care Home Management;Aquatic Therapy;Cryotherapy;Dentist;Therapeutic activities;Therapeutic exercise;Balance training;Neuromuscular re-education;Manual techniques;Orthotic Fit/Training;Passive range of motion;Energy conservation;Joint Manipulations;Taping    PT Next Visit Plan  progress balance and general LE strength, BERG assessment    PT Home Exercise Plan  No change to current  HEP, encouraged to work on balance at counter  at home    Consulted and Agree with Plan of Care  Patient       Patient will benefit from skilled therapeutic intervention in order to improve the following deficits and impairments:  Abnormal gait, Cardiopulmonary status limiting activity, Decreased activity tolerance, Decreased balance, Decreased endurance, Decreased range of motion, Decreased strength, Difficulty walking, Increased muscle spasms, Pain, Improper body mechanics, Postural dysfunction  Visit Diagnosis: Other abnormalities of gait and mobility  Difficulty in walking, not elsewhere classified  Muscle weakness (generalized)     Problem List Patient Active Problem List   Diagnosis Date Noted  . Dermatochalasis of both upper eyelids 06/15/2018  . Peroneal neuropathy, left 05/12/2018  . Left foot drop 05/12/2018  . Dysphagia 03/26/2018  . Lumbosacral radiculopathy 02/23/2018  . Facial pain 11/06/2017  . Degenerative lumbar spinal stenosis 09/03/2017  . Vallecular mass 12/31/2016  . Myasthenia gravis ([place]) 05/01/2016  . Ptosis 05/01/2016  . CAD in native artery 11/03/2015  . OSA (obstructive sleep apnea) 11/03/2015  . S/P cardiac pacemaker procedure, placement 10/24/14 Medtronic Adapta L model ADDRL 1  10/25/2014  . Complete heart block ([place]) 10/24/2014  . CHB (complete heart block), symptomatic 10/24/2014  . LAD stent 2003 10/26/2013  . HTN (hypertension) 10/26/2013  . Hyperlipidemia with target LDL less than 70 10/26/2013  . Mild obesity 10/26/2013  . Spermatic cord mass-left 03/25/2012    Hilda Blades, PT, DPT, LAT, ATC 05/19/19  3:28 PM Phone: [phone] Fax: [place] [facility] [address] [place], [place], [address] Phone: [phone]   Fax:  [phone]  Name: David [name] MRN: [id number] Date of Birth: 01/08/1936

## 2019-05-20 ENCOUNTER — Encounter (INDEPENDENT_AMBULATORY_CARE_PROVIDER_SITE_OTHER): Payer: Self-pay

## 2019-05-24 ENCOUNTER — Other Ambulatory Visit: Payer: Self-pay

## 2019-05-24 ENCOUNTER — Encounter: Payer: Self-pay | Admitting: Physical Therapy

## 2019-05-24 ENCOUNTER — Ambulatory Visit: Payer: Medicare Other | Admitting: Physical Therapy

## 2019-05-24 DIAGNOSIS — M6281 Muscle weakness (generalized): Secondary | ICD-10-CM

## 2019-05-24 DIAGNOSIS — R2689 Other abnormalities of gait and mobility: Secondary | ICD-10-CM

## 2019-05-24 DIAGNOSIS — R262 Difficulty in walking, not elsewhere classified: Secondary | ICD-10-CM

## 2019-05-24 DIAGNOSIS — M545 Low back pain: Secondary | ICD-10-CM | POA: Diagnosis not present

## 2019-05-24 DIAGNOSIS — G8929 Other chronic pain: Secondary | ICD-10-CM | POA: Diagnosis not present

## 2019-05-24 NOTE — Therapy (Addendum)
[place], [place], [address] Phone: [phone]   Fax:  [phone]  Physical Therapy Treatment / Discharge  Patient Details  Name: David [name] MRN: [id number] Date of Birth: 06/22/1936 Referring Provider (PT): Britt Bottom, MD   Encounter Date: 05/24/2019  PT End of Session - 05/24/19 1403    Visit Number  11    Number of Visits  12    Date for PT Re-Evaluation  05/26/19    Authorization Type  BCBS MCR    PT Start Time  1856    PT Stop Time  1445    PT Time Calculation (min)  43 min    Activity Tolerance  Patient tolerated treatment well    Behavior During Therapy  [facility] for tasks assessed/performed       Past Medical History:  Diagnosis Date  . CAD (coronary artery disease)    previous stent to LAD 2003  . Cancer ([place])    skin  . Hyperlipidemia   . Hypertension   . LBBB (left bundle branch block)   . Myasthenia gravis ([place])   . S/P cardiac pacemaker procedure, placement 10/24/14 Medtronic Adapta L model ADDRL 1  10/25/2014    Past Surgical History:  Procedure Laterality Date  . CORONARY ANGIOPLASTY WITH STENT PLACEMENT  03/01/2002   stenting to LAD - Dr. Glade Lloyd  . NM MYOCAR PERF WALL MOTION  11/27/2010   normal  . PERMANENT PACEMAKER INSERTION N/A 10/24/2014   Procedure: PERMANENT PACEMAKER INSERTION;  Surgeon: [name], MD;  Location: [facility] CATH LAB;  Service: Cardiovascular;  Laterality: N/A;  . TONSILLECTOMY    . US ECHOCARDIOGRAPHY  12/05/2010   proximal septal thickening,mild concentric LVH,mild deptal hypokinesis,RV mildly dilated,LA mildly dilated,mild AI,moderate aortic root dilatation,septal motion c/w conduction abnormality    There were no vitals filed for this visit.  Subjective Assessment - 05/24/19 1402    Subjective  Patient reports he is doing well and is not having any pain. He feels he has improved overall.    Currently in Pain?  No/denies                        [facility] Adult PT Treatment/Exercise - 05/24/19 0001      Neuro Re-ed    Neuro Re-ed Details   Rhomberg with eyes closed 3x20 sec, Tandem balance 3x20 sec each, Tandem balance with eyes closed 2x10 sec      Exercises   Exercises  Lumbar;Knee/Hip      Lumbar Exercises: Aerobic   Nustep  NuStep x10 min at Level 6, UE and LE      Lumbar Exercises: Machines for Strengthening   Cybex Knee Extension  3x15 25#    Cybex Knee Flexion  3x15 45#    Leg Press  3x10 45#      Lumbar Exercises: Standing   Heel Raises  10 reps   2 sets   Other Standing Lumbar Exercises  --      Knee/Hip Exercises: Standing   Hip Flexion  10 reps;2 sets    Hip Flexion Limitations  Marching, use of UE for balance    Hip Extension  10 reps;2 sets    Extension Limitations  Use of UE for balance    Forward Step Up  2 sets;10 reps    Forward Step Up Limitations  8" step    Other Standing Knee Exercises  Lateral band walk with yellow band around  knees 3x10 - use of parallel bar for balance             PT Education - 05/24/19 1402    Education Details  HEP    Person(s) Educated  Patient    Methods  Explanation;Demonstration;Verbal cues    Comprehension  Verbalized understanding;Verbal cues required       PT Short Term Goals - 05/19/19 1357      PT SHORT TERM GOAL #1   Title  Pt will be I and compliant with HEP. 4 weeks 04/14/19    Status  Achieved      PT SHORT TERM GOAL #2   Title  Pt will report no more falls. 4 weeks    Status  Achieved        PT Long Term Goals - 05/19/19 1357      PT LONG TERM GOAL #1   Title  Pt will improve leg strength to 4+/5 MMT overall to improve function (Target for all goals 8 weeks 05/26/19)    Status  On-going      PT LONG TERM GOAL #2   Title  Pt will improve BERG to at least 46 to show improved balance.    Baseline  38    Status  On-going      PT LONG TERM GOAL #3   Title  Pt will perform 6MWT to 800 ft with use of SPC  on right.    Status  Partially Met      PT LONG TERM GOAL #4   Title  Pt will report overall improved activity tolerance and decreased pain with prolonged sitting, standing, walking.    Status  On-going            Plan - 05/24/19 1407    Clinical Impression Statement  Patient tolerated therapy well with no adverse effects. He is progressing well with his strengthening and balance exercises. He reports improvement in his symptoms and feels much stronger. He continues to exhibit an endurance deficit and requires rest breaks, but this has improved. Plan to discharge next visit.    PT Treatment/Interventions  ADLs/Self Care Home Management;Aquatic Therapy;Cryotherapy;Dentist;Therapeutic activities;Therapeutic exercise;Balance training;Neuromuscular re-education;Manual techniques;Orthotic Fit/Training;Passive range of motion;Energy conservation;Joint Manipulations;Taping    PT Next Visit Plan  progress balance and general LE strength, BERG and 6MWT assessment, plan discharge next visit    PT Home Exercise Plan  No change to current HEP, encouraged to work on balance at counter at home    Consulted and Agree with Plan of Care  Patient       Patient will benefit from skilled therapeutic intervention in order to improve the following deficits and impairments:  Abnormal gait, Cardiopulmonary status limiting activity, Decreased activity tolerance, Decreased balance, Decreased endurance, Decreased range of motion, Decreased strength, Difficulty walking, Increased muscle spasms, Pain, Improper body mechanics, Postural dysfunction  Visit Diagnosis: Other abnormalities of gait and mobility  Difficulty in walking, not elsewhere classified  Muscle weakness (generalized)     Problem List Patient Active Problem List   Diagnosis Date Noted  . Dermatochalasis of both upper eyelids 06/15/2018  . Peroneal neuropathy, left 05/12/2018  . Left  foot drop 05/12/2018  . Dysphagia 03/26/2018  . Lumbosacral radiculopathy 02/23/2018  . Facial pain 11/06/2017  . Degenerative lumbar spinal stenosis 09/03/2017  . Vallecular mass 12/31/2016  . Myasthenia gravis ([place]) 05/01/2016  . Ptosis 05/01/2016  . CAD in native artery 11/03/2015  . OSA (obstructive sleep apnea) 11/03/2015  .  S/P cardiac pacemaker procedure, placement 10/24/14 Medtronic Adapta L model ADDRL 1  10/25/2014  . Complete heart block ([place]) 10/24/2014  . CHB (complete heart block), symptomatic 10/24/2014  . LAD stent 2003 10/26/2013  . HTN (hypertension) 10/26/2013  . Hyperlipidemia with target LDL less than 70 10/26/2013  . Mild obesity 10/26/2013  . Spermatic cord mass-left 03/25/2012    Hilda Blades, PT, DPT, LAT, ATC 05/24/19  3:04 PM Phone: [phone] Fax: [facility] [address] [place], [place], [address] Phone: [phone]   Fax:  [phone]  Name: David [name] MRN: [id number] Date of Birth: Feb 19, 1936  PHYSICAL THERAPY DISCHARGE SUMMARY  Visits from Start of Care: 11  Current functional level related to goals / functional outcomes: Patient has improved and reports no pain   Remaining deficits: Patient continues to exhibit strength and gait deficit  Education / Equipment: Anatomy of condition, POC, HEP Plan: Patient agrees to discharge.  Patient goals were partially met. Patient is being discharged due to being pleased with the current functional level.  ?????

## 2019-05-26 ENCOUNTER — Ambulatory Visit: Payer: Medicare Other | Admitting: Physical Therapy

## 2019-06-14 DIAGNOSIS — R3912 Poor urinary stream: Secondary | ICD-10-CM | POA: Diagnosis not present

## 2019-06-14 DIAGNOSIS — N401 Enlarged prostate with lower urinary tract symptoms: Secondary | ICD-10-CM | POA: Diagnosis not present

## 2019-06-16 DIAGNOSIS — Z8639 Personal history of other endocrine, nutritional and metabolic disease: Secondary | ICD-10-CM | POA: Diagnosis not present

## 2019-06-16 DIAGNOSIS — R7303 Prediabetes: Secondary | ICD-10-CM | POA: Diagnosis not present

## 2019-06-21 DIAGNOSIS — G7000 Myasthenia gravis without (acute) exacerbation: Secondary | ICD-10-CM | POA: Diagnosis not present

## 2019-06-21 DIAGNOSIS — I2510 Atherosclerotic heart disease of native coronary artery without angina pectoris: Secondary | ICD-10-CM | POA: Diagnosis not present

## 2019-06-21 DIAGNOSIS — R7309 Other abnormal glucose: Secondary | ICD-10-CM | POA: Diagnosis not present

## 2019-06-21 DIAGNOSIS — I10 Essential (primary) hypertension: Secondary | ICD-10-CM | POA: Diagnosis not present

## 2019-06-30 ENCOUNTER — Encounter (HOSPITAL_BASED_OUTPATIENT_CLINIC_OR_DEPARTMENT_OTHER): Payer: Self-pay | Admitting: *Deleted

## 2019-06-30 ENCOUNTER — Other Ambulatory Visit: Payer: Self-pay

## 2019-06-30 NOTE — Progress Notes (Signed)
Spoke with patient via telephone for pre op interview. NPO after MN. Patient to take Claritin, Flomax, Mestinon and Cellcept AM of surgery with a sip of water. Patient is OWER. RCC guidelines discussed with patient and wife, as well as discharge by 9am the next morning, both verbalized understanding of instructions. Patient will need EKG and Istat 8 AM of surgery. Arrival time 0630.

## 2019-07-02 ENCOUNTER — Other Ambulatory Visit (HOSPITAL_COMMUNITY)
Admission: RE | Admit: 2019-07-02 | Discharge: 2019-07-02 | Disposition: A | Discharge status: Home / Self Care | Payer: Medicare Other | Source: Ambulatory Visit | Attending: Urology | Admitting: Urology

## 2019-07-02 DIAGNOSIS — Z20828 Contact with and (suspected) exposure to other viral communicable diseases: Secondary | ICD-10-CM | POA: Insufficient documentation

## 2019-07-02 DIAGNOSIS — Z01812 Encounter for preprocedural laboratory examination: Secondary | ICD-10-CM | POA: Insufficient documentation

## 2019-07-04 LAB — NOVEL CORONAVIRUS, NAA (HOSP ORDER, SEND-OUT TO REF LAB; TAT 18-24 HRS): SARS-CoV-2, NAA: NOT DETECTED

## 2019-07-06 ENCOUNTER — Other Ambulatory Visit: Payer: Self-pay

## 2019-07-06 ENCOUNTER — Observation Stay (HOSPITAL_BASED_OUTPATIENT_CLINIC_OR_DEPARTMENT_OTHER)
Admission: RE | Admit: 2019-07-06 | Discharge: 2019-07-07 | Disposition: A | Discharge status: Home / Self Care | Payer: Medicare Other | Attending: Urology | Admitting: Urology

## 2019-07-06 ENCOUNTER — Encounter (HOSPITAL_BASED_OUTPATIENT_CLINIC_OR_DEPARTMENT_OTHER): Payer: Self-pay

## 2019-07-06 ENCOUNTER — Ambulatory Visit (HOSPITAL_BASED_OUTPATIENT_CLINIC_OR_DEPARTMENT_OTHER): Payer: Medicare Other | Admitting: Certified Registered"

## 2019-07-06 ENCOUNTER — Encounter (HOSPITAL_BASED_OUTPATIENT_CLINIC_OR_DEPARTMENT_OTHER)
Admission: RE | Disposition: A | Discharge status: Home / Self Care | Payer: Self-pay | Source: Home / Self Care | Attending: Urology

## 2019-07-06 DIAGNOSIS — I10 Essential (primary) hypertension: Secondary | ICD-10-CM | POA: Diagnosis not present

## 2019-07-06 DIAGNOSIS — N138 Other obstructive and reflux uropathy: Secondary | ICD-10-CM | POA: Diagnosis present

## 2019-07-06 DIAGNOSIS — I252 Old myocardial infarction: Secondary | ICD-10-CM | POA: Diagnosis not present

## 2019-07-06 DIAGNOSIS — N400 Enlarged prostate without lower urinary tract symptoms: Secondary | ICD-10-CM | POA: Diagnosis not present

## 2019-07-06 DIAGNOSIS — I2510 Atherosclerotic heart disease of native coronary artery without angina pectoris: Secondary | ICD-10-CM | POA: Diagnosis not present

## 2019-07-06 DIAGNOSIS — Z7951 Long term (current) use of inhaled steroids: Secondary | ICD-10-CM | POA: Diagnosis not present

## 2019-07-06 DIAGNOSIS — Z955 Presence of coronary angioplasty implant and graft: Secondary | ICD-10-CM | POA: Insufficient documentation

## 2019-07-06 DIAGNOSIS — R9431 Abnormal electrocardiogram [ECG] [EKG]: Secondary | ICD-10-CM | POA: Insufficient documentation

## 2019-07-06 DIAGNOSIS — Z87891 Personal history of nicotine dependence: Secondary | ICD-10-CM | POA: Diagnosis not present

## 2019-07-06 DIAGNOSIS — N402 Nodular prostate without lower urinary tract symptoms: Secondary | ICD-10-CM | POA: Diagnosis not present

## 2019-07-06 DIAGNOSIS — N401 Enlarged prostate with lower urinary tract symptoms: Secondary | ICD-10-CM | POA: Diagnosis not present

## 2019-07-06 DIAGNOSIS — Z950 Presence of cardiac pacemaker: Secondary | ICD-10-CM | POA: Diagnosis not present

## 2019-07-06 DIAGNOSIS — G7000 Myasthenia gravis without (acute) exacerbation: Secondary | ICD-10-CM | POA: Insufficient documentation

## 2019-07-06 DIAGNOSIS — N529 Male erectile dysfunction, unspecified: Secondary | ICD-10-CM | POA: Diagnosis not present

## 2019-07-06 DIAGNOSIS — G4733 Obstructive sleep apnea (adult) (pediatric): Secondary | ICD-10-CM | POA: Diagnosis not present

## 2019-07-06 DIAGNOSIS — Z7982 Long term (current) use of aspirin: Secondary | ICD-10-CM | POA: Diagnosis not present

## 2019-07-06 DIAGNOSIS — N320 Bladder-neck obstruction: Secondary | ICD-10-CM | POA: Diagnosis not present

## 2019-07-06 HISTORY — PX: TRANSURETHRAL RESECTION OF PROSTATE: SHX73

## 2019-07-06 LAB — HEMOGLOBIN AND HEMATOCRIT, BLOOD
HCT: 38.3 % — ABNORMAL LOW (ref 39.0–52.0)
Hemoglobin: 11.3 g/dL — ABNORMAL LOW (ref 13.0–17.0)

## 2019-07-06 LAB — POCT I-STAT, CHEM 8
BUN: 15 mg/dL (ref 8–23)
Calcium, Ion: 1.26 mmol/L (ref 1.15–1.40)
Chloride: 103 mmol/L (ref 98–111)
Creatinine, Ser: 0.6 mg/dL — ABNORMAL LOW (ref 0.61–1.24)
Glucose, Bld: 125 mg/dL — ABNORMAL HIGH (ref 70–99)
HCT: 40 % (ref 39.0–52.0)
Hemoglobin: 13.6 g/dL (ref 13.0–17.0)
Potassium: 3.7 mmol/L (ref 3.5–5.1)
Sodium: 139 mmol/L (ref 135–145)
TCO2: 29 mmol/L (ref 22–32)

## 2019-07-06 SURGERY — TURP (TRANSURETHRAL RESECTION OF PROSTATE)
Anesthesia: General

## 2019-07-06 MED ORDER — BELLADONNA ALKALOIDS-OPIUM 16.2-60 MG RE SUPP
RECTAL | Status: AC
  Filled 2019-07-06: qty 1

## 2019-07-06 MED ORDER — ONDANSETRON HCL 4 MG/2ML IJ SOLN
INTRAMUSCULAR | Status: AC
  Filled 2019-07-06: qty 2

## 2019-07-06 MED ORDER — ONDANSETRON HCL 4 MG/2ML IJ SOLN
INTRAMUSCULAR | Status: DC | PRN
  Administered 2019-07-06: 4 mg via INTRAVENOUS

## 2019-07-06 MED ORDER — ACETAMINOPHEN 325 MG PO TABS
650.0000 mg | ORAL_TABLET | ORAL | Status: DC | PRN
  Administered 2019-07-06 (×2): 650 mg via ORAL
  Filled 2019-07-06: qty 2

## 2019-07-06 MED ORDER — DIPHENHYDRAMINE HCL 50 MG/ML IJ SOLN
12.5000 mg | Freq: Four times a day (QID) | INTRAMUSCULAR | Status: DC | PRN
  Filled 2019-07-06: qty 0.25

## 2019-07-06 MED ORDER — ACETAMINOPHEN 325 MG PO TABS
ORAL_TABLET | ORAL | Status: AC
  Filled 2019-07-06: qty 2

## 2019-07-06 MED ORDER — CEPHALEXIN 500 MG PO CAPS
500.0000 mg | ORAL_CAPSULE | Freq: Three times a day (TID) | ORAL | Status: DC
  Administered 2019-07-06 – 2019-07-07 (×3): 500 mg via ORAL
  Filled 2019-07-06 (×4): qty 1

## 2019-07-06 MED ORDER — ACETAMINOPHEN 500 MG PO TABS
ORAL_TABLET | ORAL | Status: AC
  Filled 2019-07-06: qty 2

## 2019-07-06 MED ORDER — PYRIDOSTIGMINE BROMIDE 60 MG PO TABS
60.0000 mg | ORAL_TABLET | Freq: Once | ORAL | Status: AC
  Administered 2019-07-06: 60 mg via ORAL

## 2019-07-06 MED ORDER — PROPOFOL 10 MG/ML IV BOLUS
INTRAVENOUS | Status: AC
  Filled 2019-07-06: qty 20

## 2019-07-06 MED ORDER — CEFAZOLIN SODIUM-DEXTROSE 2-4 GM/100ML-% IV SOLN
INTRAVENOUS | Status: AC
  Filled 2019-07-06: qty 100

## 2019-07-06 MED ORDER — DIPHENHYDRAMINE HCL 12.5 MG/5ML PO ELIX
12.5000 mg | ORAL_SOLUTION | Freq: Four times a day (QID) | ORAL | Status: DC | PRN
  Filled 2019-07-06: qty 5

## 2019-07-06 MED ORDER — CEFAZOLIN SODIUM-DEXTROSE 2-4 GM/100ML-% IV SOLN
2.0000 g | Freq: Once | INTRAVENOUS | Status: AC
  Administered 2019-07-06: 2 g via INTRAVENOUS
  Filled 2019-07-06: qty 100

## 2019-07-06 MED ORDER — BELLADONNA ALKALOIDS-OPIUM 16.2-60 MG RE SUPP
1.0000 | Freq: Four times a day (QID) | RECTAL | Status: DC | PRN
  Filled 2019-07-06: qty 1

## 2019-07-06 MED ORDER — ONDANSETRON HCL 4 MG/2ML IJ SOLN
4.0000 mg | INTRAMUSCULAR | Status: DC | PRN
  Filled 2019-07-06: qty 2

## 2019-07-06 MED ORDER — LACTATED RINGERS IV SOLN
INTRAVENOUS | Status: DC
  Administered 2019-07-06: 08:00:00 via INTRAVENOUS
  Filled 2019-07-06: qty 1000

## 2019-07-06 MED ORDER — FENTANYL CITRATE (PF) 100 MCG/2ML IJ SOLN
INTRAMUSCULAR | Status: DC | PRN
  Administered 2019-07-06: 25 ug via INTRAVENOUS

## 2019-07-06 MED ORDER — BELLADONNA ALKALOIDS-OPIUM 16.2-60 MG RE SUPP
RECTAL | Status: DC | PRN
  Administered 2019-07-06: 1 via RECTAL

## 2019-07-06 MED ORDER — SODIUM CHLORIDE 0.9 % IV SOLN
INTRAVENOUS | Status: DC
  Administered 2019-07-07: 01:00:00 via INTRAVENOUS
  Filled 2019-07-06: qty 1000

## 2019-07-06 MED ORDER — HYDROCODONE-ACETAMINOPHEN 5-325 MG PO TABS
1.0000 | ORAL_TABLET | ORAL | Status: DC | PRN
  Filled 2019-07-06: qty 2

## 2019-07-06 MED ORDER — MORPHINE SULFATE (PF) 2 MG/ML IV SOLN
2.0000 mg | INTRAVENOUS | Status: DC | PRN
  Filled 2019-07-06: qty 2

## 2019-07-06 MED ORDER — FENTANYL CITRATE (PF) 100 MCG/2ML IJ SOLN
INTRAMUSCULAR | Status: AC
  Filled 2019-07-06: qty 2

## 2019-07-06 MED ORDER — LIDOCAINE 2% (20 MG/ML) 5 ML SYRINGE
INTRAMUSCULAR | Status: AC
  Filled 2019-07-06: qty 5

## 2019-07-06 MED ORDER — PHENYLEPHRINE 40 MCG/ML (10ML) SYRINGE FOR IV PUSH (FOR BLOOD PRESSURE SUPPORT)
PREFILLED_SYRINGE | INTRAVENOUS | Status: DC | PRN
  Administered 2019-07-06: 120 ug via INTRAVENOUS
  Administered 2019-07-06: 80 ug via INTRAVENOUS

## 2019-07-06 MED ORDER — ACETAMINOPHEN 500 MG PO TABS
1000.0000 mg | ORAL_TABLET | Freq: Once | ORAL | Status: AC
  Administered 2019-07-06: 1000 mg via ORAL
  Filled 2019-07-06: qty 2

## 2019-07-06 MED ORDER — FENTANYL CITRATE (PF) 100 MCG/2ML IJ SOLN
25.0000 ug | INTRAMUSCULAR | Status: DC | PRN
  Filled 2019-07-06: qty 1

## 2019-07-06 MED ORDER — SODIUM CHLORIDE 0.9 % IR SOLN
3000.0000 mL | Status: DC
  Administered 2019-07-06 – 2019-07-07 (×2): 3000 mL
  Filled 2019-07-06: qty 3000

## 2019-07-06 MED ORDER — OXYBUTYNIN CHLORIDE ER 10 MG PO TB24
10.0000 mg | ORAL_TABLET | Freq: Every day | ORAL | Status: DC
  Administered 2019-07-06: 10 mg via ORAL
  Filled 2019-07-06 (×2): qty 1

## 2019-07-06 MED ORDER — LIDOCAINE 2% (20 MG/ML) 5 ML SYRINGE
INTRAMUSCULAR | Status: DC | PRN
  Administered 2019-07-06: 40 mg via INTRAVENOUS

## 2019-07-06 MED ORDER — PROPOFOL 10 MG/ML IV BOLUS
INTRAVENOUS | Status: DC | PRN
  Administered 2019-07-06: 80 mg via INTRAVENOUS
  Administered 2019-07-06: 30 mg via INTRAVENOUS

## 2019-07-06 SURGICAL SUPPLY — 21 items
BAG DRAIN URO-CYSTO SKYTR STRL (DRAIN) ×2 IMPLANT
BAG DRN RND TRDRP ANRFLXCHMBR (UROLOGICAL SUPPLIES) ×1
BAG DRN UROCATH (DRAIN) ×1
BAG URINE DRAIN 2000ML AR STRL (UROLOGICAL SUPPLIES) ×2 IMPLANT
CATH FOLEY 3WAY 30CC 22F (CATHETERS) IMPLANT
CATH FOLEY 3WAY 30CC 24FR (CATHETERS) ×2
CATH URTH STD 24FR FL 3W 2 (CATHETERS) IMPLANT
EVACUATOR MICROVAS BLADDER (UROLOGICAL SUPPLIES) ×1 IMPLANT
GLOVE BIO SURGEON STRL SZ7.5 (GLOVE) ×2 IMPLANT
GOWN STRL REUS W/TWL XL LVL3 (GOWN DISPOSABLE) ×2 IMPLANT
HOLDER FOLEY CATH W/STRAP (MISCELLANEOUS) ×1 IMPLANT
IV NS IRRIG 3000ML ARTHROMATIC (IV SOLUTION) ×14 IMPLANT
LOOP CUT BIPOLAR 24F LRG (ELECTROSURGICAL) ×1 IMPLANT
MANIFOLD NEPTUNE II (INSTRUMENTS) ×2 IMPLANT
PACK CYSTO (CUSTOM PROCEDURE TRAY) ×2 IMPLANT
PIN SAFETY STERILE (MISCELLANEOUS) ×2 IMPLANT
RUBBERBAND STERILE (MISCELLANEOUS) ×1 IMPLANT
SYRINGE IRR TOOMEY STRL 70CC (SYRINGE) ×2 IMPLANT
TRAY IRRIG W/60CC SYR STRL (SET/KITS/TRAYS/PACK) ×1 IMPLANT
TUBE CONNECTING 12X1/4 (SUCTIONS) ×2 IMPLANT
TUBING UROLOGY SET (TUBING) ×2 IMPLANT

## 2019-07-06 NOTE — H&P (Signed)
PRE-OP H&P  Office Visit Report     06/14/2019   --------------------------------------------------------------------------------   David [name]  MRN: [id number]  DOB: 1935-09-22, 83 year old Male   PRIMARY CARE:  David Pretty, MD  REFERRING:  David Pretty, MD  PROVIDER:  Ellison [name], M.D.  TREATING:  David [name] [place], [place]  LOCATION:  [facility] [phone]     --------------------------------------------------------------------------------   CC/HPI: Pt presents today for pre-operative history and physical exam in anticipation of TURP by Dr. Lovena Neighbours on 07/06/19. He is doing well and is without complaint. Pt denies F/C, HA, CP, SOB, N/V, diarrhea/constipation, back pain, flank pain, hematuria, and dysuria.    HX:   Enlarged prostate   The patient is an 83 year old male with a history of BPH with lower urinary tract symptoms and erectile dysfunction.   [name] returns today for a routine follow-up after starting tamsulosin 0.4 mg BID. He reports that he continues to have weak FOS, hesitancy, urgency, pvd and nocturia x3 despite BID tamsulosin. Denies interval UTIs, dysuria or hematuria. UA clear today.   PVR- 40 mL     ALLERGIES: Gris-PEG TABS - Headache    MEDICATIONS: Sildenafil Citrate 20 mg tablet 2-5 tabs PO as directed  Tamsulosin Hcl 0.4 mg capsule 1 capsule PO BID  Uroxatral 10 mg tablet, extended release 24 hr  Aspirin 81 MG Oral Tablet Chewable Oral  Fish Oil  Flonase Allergy Relief  Glucosamine HCl TABS Oral  Loratadine  Multi-Day TABS Oral  Mycophenolate Mofetil 250 mg capsule  Nitroglycerin 0.4 mg tablet, sublingual  Pyridostigmine Bromide 60 mg tablet  Tamsulosin HCl - 0.4 MG Oral Capsule 0 Oral  Triamterene-HCTZ 37.5-25 MG Oral Tablet Oral  Uribel  Vytorin 10-40 MG Oral Tablet Oral     GU PSH: Vasectomy - 2010/11/08       [place] Notes: Surgery Of Male Genitalia Vasectomy, Cath Stent Placement, Tonsillectomy   NON-GU  PSH: Remove Tonsils - 11-08-2010     GU PMH: Urinary Hesitancy - 04/05/2019 Weak Urinary Stream (Stable) - 04/05/2019, Weak urinary stream, - 2015/11/08 BPH w/LUTS - 01/03/2019, Benign prostatic hyperplasia with urinary obstruction, - 08-Nov-2015 Nocturia, Nocturia - 11-08-2015 ED due to arterial insufficiency, Erectile dysfunction due to arterial insufficiency - 11-07-13 Dysuria, Dysuria - 11-07-2012 Varicocele, Varicocele - 2012/11/07      PMH Notes:  2011-02-10 15:07:26 - Note: Acute Myocardial Infarction  2011-02-10 15:07:26 - Note: Skin Cancer   NON-GU PMH: Encounter for general adult medical examination without abnormal findings, Encounter for preventive health examination - 2015-11-08 Personal history of other diseases of the circulatory system, History of cardiac disorder - 11/07/12 Personal history of other diseases of the digestive system, History of esophageal reflux - 2012-11-07 Personal history of other diseases of the nervous system and sense organs, History of sleep apnea - 11/07/2012    FAMILY HISTORY: Death In The Family Father - [place] Family Death In The Family Mother - [place] is 1 daughter and 1 - [place] _4__ Living Daughter - Runs In Family Lung Cancer - Runs In Family Prostate Cancer - Runs In Family   SOCIAL HISTORY: Marital Status: Married Current Smoking Status: Patient does not smoke anymore. Has not smoked since 05/29/1979.   Tobacco Use Assessment Completed: Used Tobacco in last 30 days? Does not use smokeless tobacco. Drinks 1 drink per month. Types of alcohol consumed: Beer.  Does not use drugs. Drinks 1 caffeinated  drink per day. Has not had a blood transfusion.     Notes: Former smoker, Caffeine Use, Marital History - Currently Married, Alcohol Use   REVIEW OF SYSTEMS:    GU Review Male:   Patient reports frequent urination, hard to postpone urination, get up at night to urinate, and leakage of urine. Patient denies burning/ pain with  urination, stream starts and stops, trouble starting your stream, have to strain to urinate , erection problems, and penile pain.  Gastrointestinal (Upper):   Patient denies nausea, vomiting, and indigestion/ heartburn.  Gastrointestinal (Lower):   Patient denies diarrhea and constipation.  Constitutional:   Patient denies fatigue, night sweats, fever, and weight loss.  Skin:   Patient denies skin rash/ lesion and itching.  Eyes:   Patient denies blurred vision and double vision.  Ears/ Nose/ Throat:   Patient denies sore throat and sinus problems.  Hematologic/Lymphatic:   Patient denies swollen glands and easy bruising.  Cardiovascular:   Patient reports leg swelling. Patient denies chest pains.  Respiratory:   Patient denies cough and shortness of breath.  Endocrine:   Patient denies excessive thirst.  Musculoskeletal:   Patient reports joint pain. Patient denies back pain.  Neurological:   Patient denies headaches and dizziness.  Psychologic:   Patient denies depression and anxiety.   VITAL SIGNS:      06/14/2019 02:51 PM  Weight 225 lb / 102.06 kg  Height 69 in / 175.26 cm  BP 135/76 mmHg  Pulse 76 /min  Temperature 98.0 F / 36.6 C  BMI 33.2 kg/m   MULTI-SYSTEM PHYSICAL EXAMINATION:    Constitutional: Well-nourished. No physical deformities. Normally developed. Good grooming.  Neck: Neck symmetrical, not swollen. Normal tracheal position.  Respiratory: Normal breath sounds. No labored breathing, no use of accessory muscles.   Cardiovascular: Regular rate and rhythm. No murmur, no gallop.   Lymphatic: No enlargement of neck, axillae, groin.  Skin: No paleness, no jaundice, no cyanosis. No lesion, no ulcer, no rash.  Neurologic / Psychiatric: Oriented to time, oriented to place, oriented to person. No depression, no anxiety, no agitation.  Gastrointestinal: No mass, no tenderness, no rigidity, non obese abdomen.  Eyes: Normal conjunctivae. Normal eyelids.  Ears, Nose, Mouth, and  Throat: Left ear no scars, no lesions, no masses. Right ear no scars, no lesions, no masses. Nose no scars, no lesions, no masses. Normal hearing. Normal lips.  Musculoskeletal: Normal gait and station of head and neck.     PAST DATA REVIEWED:  Source Of History:  Patient  Records Review:   Previous Patient Records  Urine Test Review:   Urinalysis   06/14/19  Urinalysis  Urine Appearance Clear   Urine Color Yellow   Urine Glucose Neg mg/dL  Urine Bilirubin Neg mg/dL  Urine Ketones Neg mg/dL  Urine Specific Gravity 1.015   Urine Blood Neg ery/uL  Urine pH 7.5   Urine Protein Trace mg/dL  Urine Urobilinogen 0.2 mg/dL  Urine Nitrites Neg   Urine Leukocyte Esterase Neg leu/uL   PROCEDURES:          Urinalysis - 81003 Dipstick Dipstick Cont'd  Color: Yellow Bilirubin: Neg mg/dL  Appearance: Clear Ketones: Neg mg/dL  Specific Gravity: 1.015 Blood: Neg ery/uL  pH: 7.5 Protein: Trace mg/dL  Glucose: Neg mg/dL Urobilinogen: 0.2 mg/dL    Nitrites: Neg    Leukocyte Esterase: Neg leu/uL    ASSESSMENT:      ICD-10 Details  1 GU:   BPH w/LUTS - N40.1  PLAN:   -The risks, benefits and alternatives of cystoscopy with TURP was discussed with the patient. The risks included, but are not limited to, bleeding, urinary tract infection, bladder perforation requiring prolonged catheterization and/or open bladder repair, ureteral injury, ureteral obstruction, urethral stricture disease, new or worsening voiding dysfunction, retrograde ejaculation, MI, CVA, PE, DVT and the inherent risks of general anesthesia. We also discussed the need for Foley catheterization for at least 3 days post-op and the likely need for post-op observation in the hospital following the procedure.

## 2019-07-06 NOTE — Op Note (Addendum)
Operative Note  Preoperative diagnosis:  1.  BPH with bladder outlet obstruction  Postoperative diagnosis: 1.  BPH with bladder outlet obstruction  Procedure(s): 1.  Bipolar TURP  Surgeon: Ellison Hughs, MD  Assistants:  Sharlot Gowda, MD PGY-4  Anesthesia:  General  Complications:  None  EBL: 100 mL  Specimens: 1. Prostate chips  Drains/Catheters: 1.  24 French three-way Foley catheter with 30 mm balloon  Intraoperative findings:   1. Trilobar prostatic urethral obstruction  Indication:  David [name] is a 83 y.o. male with BPH with worsening LUTS despite trialing multiple alpha blockers.  He has been consented for the above procedures, voices understanding and wishes to proceed.  Description of procedure:  After informed consent was obtained, the patient was brought to the operating room and general anesthesia was administered. The patient was then placed in the dorsolithotomy position and prepped and draped in usual sterile fashion. A timeout was performed. A 23 French rigid cystoscope was then inserted into the urethral meatus and advanced into the bladder under direct vision. A complete bladder survey revealed no intravesical pathology.  Both ureteral orifices were identified and well away from the bladder neck.  The rigid cystoscope was then exchanged for a 26 French resectoscope with a bipolar loop working element.  Starting at the bladder neck and progressing distally to the verumontanum, the prostatic adenoma was systematically resected until a widely patent prostatic urethral channel was created.  All prostate chips were then hand irrigated out of the bladder and sent to pathology for permanent section.  The resectoscope was then removed and exchanged for a 24 [place] three-way Foley catheter.  The three-way Foley catheter was then extensively hand irrigated until the irrigant returned clear to light pink.  The catheter was then placed to continuous bladder  irrigation and placed on rubber band traction.  He tolerated the procedure well and was transferred to the postanesthesia unit in stable condition.  Plan:  CBI and traction overnight

## 2019-07-06 NOTE — Anesthesia Procedure Notes (Signed)
Procedure Name: LMA Insertion Date/Time: 07/06/2019 8:35 AM Performed by: Niel Hummer, CRNA Pre-anesthesia Checklist: Patient identified, Emergency Drugs available, Suction available and Patient being monitored Patient Re-evaluated:Patient Re-evaluated prior to induction Oxygen Delivery Method: Circle system utilized Preoxygenation: Pre-oxygenation with 100% oxygen Induction Type: IV induction LMA: LMA inserted LMA Size: 4.0 Dental Injury: Teeth and Oropharynx as per pre-operative assessment

## 2019-07-06 NOTE — Anesthesia Preprocedure Evaluation (Addendum)
Anesthesia Evaluation  Patient identified by MRN, date of birth, ID band Patient awake    Reviewed: Allergy & Precautions, NPO status , Patient's Chart, lab work & pertinent test results  Airway Mallampati: III  TM Distance: >3 FB Neck ROM: Full  Mouth opening: Limited Mouth Opening  Dental no notable dental hx. (+) Teeth Intact, Dental Advisory Given   Pulmonary sleep apnea (does not wear CPAP) , former smoker,    Pulmonary exam normal breath sounds clear to auscultation       Cardiovascular hypertension, + CAD and + Cardiac Stents (LAD 2003)  Normal cardiovascular exam+ pacemaker (PPM for CHB)  Rhythm:Regular Rate:Normal  Device Check 02/2019 Scheduled remote reviewed.  Normal device function.  5 VHR, most AT, 2 appear NST VT longest 9 beats, 190's  TTE 2016 - LVEF 55-60%, moderate LVH, diastolic dysfunction, mild AI, dilated aortic root up to 4.3 cm, moderate TR, RVSP 43 mmHg.  Stress Test 2016 Low risk stress nuclear study with a small, mild intensity, fixed inferior defect consistent with inferior thinning; no ischemia; EF 43 with apical hypokinesis and mild LVE.   Neuro/Psych Myasthenia gravis, stable, no missed doses of mestinon negative neurological ROS  negative psych ROS   GI/Hepatic negative GI ROS, Neg liver ROS,   Endo/Other  negative endocrine ROS  Renal/GU negative Renal ROS  negative genitourinary   Musculoskeletal negative musculoskeletal ROS (+)   Abdominal   Peds  Hematology negative hematology ROS (+)   Anesthesia Other Findings   Reproductive/Obstetrics                            Anesthesia Physical Anesthesia Plan  ASA: III  Anesthesia Plan: General   Post-op Pain Management:    Induction: Intravenous  PONV Risk Score and Plan: 2 and Ondansetron, Dexamethasone and Treatment may vary due to age or medical condition  Airway Management Planned: LMA  Additional  Equipment:   Intra-op Plan:   Post-operative Plan: Extubation in OR  Informed Consent: I have reviewed the patients History and Physical, chart, labs and discussed the procedure including the risks, benefits and alternatives for the proposed anesthesia with the patient or authorized representative who has indicated his/her understanding and acceptance.     Dental advisory given  Plan Discussed with: CRNA  Anesthesia Plan Comments:         Anesthesia Quick Evaluation

## 2019-07-06 NOTE — Anesthesia Postprocedure Evaluation (Signed)
Anesthesia Post Note  Patient: David [name]  Procedure(s) Performed: TRANSURETHRAL RESECTION OF THE PROSTATE (TURP), BIPOLAR (N/A )     Patient location during evaluation: PACU Anesthesia Type: General Level of consciousness: awake and alert Pain management: pain level controlled Vital Signs Assessment: post-procedure vital signs reviewed and stable Respiratory status: spontaneous breathing, nonlabored ventilation, respiratory function stable and patient connected to nasal cannula oxygen Cardiovascular status: blood pressure returned to baseline and stable Postop Assessment: no apparent nausea or vomiting Anesthetic complications: no    Last Vitals:  Vitals:   07/06/19 1045 07/06/19 1100  BP: 94/77   Pulse: (!) 59 61  Resp: 18 18  Temp:    SpO2: 97% 97%    Last Pain:  Vitals:   07/06/19 1045  TempSrc:   PainSc: 0-No pain                 Akshita Italiano L Jusiah Aguayo

## 2019-07-06 NOTE — Transfer of Care (Signed)
Immediate Anesthesia Transfer of Care Note  Patient: David [name]  Procedure(s) Performed: TRANSURETHRAL RESECTION OF THE PROSTATE (TURP), BIPOLAR (N/A )  Patient Location: PACU  Anesthesia Type:General  Level of Consciousness: awake, alert  and oriented  Airway & Oxygen Therapy: Patient Spontanous Breathing and Patient connected to face mask oxygen  Post-op Assessment: Report given to RN, Post -op Vital signs reviewed and stable and Patient moving all extremities X 4  Post vital signs: Reviewed and stable  Last Vitals:  Vitals Value Taken Time  BP 148/72 07/06/19 1017  Temp    Pulse 64 07/06/19 1019  Resp 25 07/06/19 1019  SpO2 98 % 07/06/19 1019  Vitals shown include unvalidated device data.  Last Pain:  Vitals:   07/06/19 0733  TempSrc: Oral  PainSc: 0-No pain      Patients Stated Pain Goal: 5 [phone]  Complications: No apparent anesthesia complications

## 2019-07-07 DIAGNOSIS — Z7951 Long term (current) use of inhaled steroids: Secondary | ICD-10-CM | POA: Diagnosis not present

## 2019-07-07 DIAGNOSIS — I10 Essential (primary) hypertension: Secondary | ICD-10-CM | POA: Diagnosis not present

## 2019-07-07 DIAGNOSIS — I252 Old myocardial infarction: Secondary | ICD-10-CM | POA: Diagnosis not present

## 2019-07-07 DIAGNOSIS — N401 Enlarged prostate with lower urinary tract symptoms: Secondary | ICD-10-CM | POA: Diagnosis not present

## 2019-07-07 DIAGNOSIS — I2510 Atherosclerotic heart disease of native coronary artery without angina pectoris: Secondary | ICD-10-CM | POA: Diagnosis not present

## 2019-07-07 DIAGNOSIS — R9431 Abnormal electrocardiogram [ECG] [EKG]: Secondary | ICD-10-CM | POA: Diagnosis not present

## 2019-07-07 DIAGNOSIS — G7000 Myasthenia gravis without (acute) exacerbation: Secondary | ICD-10-CM | POA: Diagnosis not present

## 2019-07-07 DIAGNOSIS — Z7982 Long term (current) use of aspirin: Secondary | ICD-10-CM | POA: Diagnosis not present

## 2019-07-07 DIAGNOSIS — Z87891 Personal history of nicotine dependence: Secondary | ICD-10-CM | POA: Diagnosis not present

## 2019-07-07 DIAGNOSIS — G4733 Obstructive sleep apnea (adult) (pediatric): Secondary | ICD-10-CM | POA: Diagnosis not present

## 2019-07-07 DIAGNOSIS — Z955 Presence of coronary angioplasty implant and graft: Secondary | ICD-10-CM | POA: Diagnosis not present

## 2019-07-07 DIAGNOSIS — Z950 Presence of cardiac pacemaker: Secondary | ICD-10-CM | POA: Diagnosis not present

## 2019-07-07 DIAGNOSIS — N529 Male erectile dysfunction, unspecified: Secondary | ICD-10-CM | POA: Diagnosis not present

## 2019-07-07 DIAGNOSIS — N138 Other obstructive and reflux uropathy: Secondary | ICD-10-CM | POA: Diagnosis not present

## 2019-07-07 LAB — BASIC METABOLIC PANEL
Anion gap: 8 (ref 5–15)
BUN: 11 mg/dL (ref 8–23)
CO2: 26 mmol/L (ref 22–32)
Calcium: 8.3 mg/dL — ABNORMAL LOW (ref 8.9–10.3)
Chloride: 103 mmol/L (ref 98–111)
Creatinine, Ser: 0.47 mg/dL — ABNORMAL LOW (ref 0.61–1.24)
GFR calc Af Amer: >60 mL/min (ref >60)
GFR calc non Af Amer: >60 mL/min (ref >60)
Glucose, Bld: 118 mg/dL — ABNORMAL HIGH (ref 70–99)
Potassium: 3.6 mmol/L (ref 3.5–5.1)
Sodium: 137 mmol/L (ref 135–145)

## 2019-07-07 LAB — HEMOGLOBIN AND HEMATOCRIT, BLOOD
HCT: 33.8 % — ABNORMAL LOW (ref 39.0–52.0)
Hemoglobin: 9.9 g/dL — ABNORMAL LOW (ref 13.0–17.0)

## 2019-07-07 LAB — SURGICAL PATHOLOGY

## 2019-07-07 MED ORDER — ACETAMINOPHEN 500 MG PO TABS: 500.0000 mg | ORAL_TABLET | ORAL | 0 refills | Status: AC | PRN

## 2019-07-07 MED ORDER — CEFDINIR 300 MG PO CAPS: 300.0000 mg | ORAL_CAPSULE | Freq: Two times a day (BID) | ORAL | 0 refills | Status: AC

## 2019-07-07 NOTE — Discharge Instructions (Signed)
**Note David-Identified via Obfuscation** Indwelling Urinary Catheter Care, Adult An indwelling urinary catheter is a thin tube that is put into your bladder. The tube helps to drain pee (urine) out of your body. The tube goes in through your urethra. Your urethra is where pee comes out of your body. Your pee will come out through the catheter, then it will go into a bag (drainage bag). Take good care of your catheter so it will work well. How to wear your catheter and bag Supplies needed Sticky tape (adhesive tape) or a leg strap. Alcohol wipe or soap and water (if you use tape). A clean towel (if you use tape). Large overnight bag. Smaller bag (leg bag). Wearing your catheter Attach your catheter to your leg with tape or a leg strap. Make sure the catheter is not pulled tight. If a leg strap gets wet, take it off and put on a dry strap. If you use tape to hold the bag on your leg: Use an alcohol wipe or soap and water to wash your skin where the tape made it sticky before. Use a clean towel to pat-dry that skin. Use new tape to make the bag stay on your leg. Wearing your bags You should have been given a large overnight bag. You may wear the overnight bag in the day or night. Always have the overnight bag lower than your bladder.  Do not let the bag touch the floor. Before you go to sleep, put a clean plastic bag in a wastebasket. Then hang the overnight bag inside the wastebasket. You should also have a smaller leg bag that fits under your clothes. Always wear the leg bag below your knee. Do not wear your leg bag at night. How to care for your skin and catheter Supplies needed A clean washcloth. Water and mild soap. A clean towel. Caring for your skin and catheter     Clean the skin around your catheter every day: Wash your hands with soap and water. Wet a clean washcloth in warm water and mild soap. Clean the skin around your urethra. If you are male: Gently spread the folds of skin around your vagina  (labia). With the washcloth in your other hand, wipe the inner side of your labia on each side. Wipe from front to back. If you are male: Pull back any skin that covers the end of your penis (foreskin). With the washcloth in your other hand, wipe your penis in small circles. Start wiping at the tip of your penis, then move away from the catheter. Move the foreskin back in place, if needed. With your free hand, hold the catheter close to where it goes into your body. Keep holding the catheter during cleaning so it does not get pulled out. With the washcloth in your other hand, clean the catheter. Only wipe downward on the catheter. Do not wipe upward toward your body. Doing this may push germs into your urethra and cause infection. Use a clean towel to pat-dry the catheter and the skin around it. Make sure to wipe off all soap. Wash your hands with soap and water. Shower every day. Do not take baths. Do not use cream, ointment, or lotion on the area where the catheter goes into your body, unless your doctor tells you to. Do not use powders, sprays, or lotions on your genital area. Check your skin around the catheter every day for signs of infection. Check for: Redness, swelling, or pain. Fluid or blood. Warmth. Pus or a bad smell.  How to empty the bag Supplies needed Rubbing alcohol. Gauze pad or cotton ball. Tape or a leg strap. Emptying the bag Pour the pee out of your bag when it is ?- full, or at least 2-3 times a day. Do this for your overnight bag and your leg bag. Wash your hands with soap and water. Separate (detach) the bag from your leg. Hold the bag over the toilet or a clean pail. Keep the bag lower than your hips and bladder. This is so the pee (urine) does not go back into the tube. Open the pour spout. It is at the bottom of the bag. Empty the pee into the toilet or pail. Do not let the pour spout touch any surface. Put rubbing alcohol on a gauze pad or cotton  ball. Use the gauze pad or cotton ball to clean the pour spout. Close the pour spout. Attach the bag to your leg with tape or a leg strap. Wash your hands with soap and water. Follow instructions for cleaning the drainage bag: From the product maker. As told by your doctor. How to change the bag Supplies needed Alcohol wipes. A clean bag. Tape or a leg strap. Changing the bag Replace your bag when it starts to leak, smell bad, or look dirty. Wash your hands with soap and water. Separate the dirty bag from your leg. Pinch the catheter with your fingers so that pee does not spill out. Separate the catheter tube from the bag tube where these tubes connect (at the connection valve). Do not let the tubes touch any surface. Clean the end of the catheter tube with an alcohol wipe. Use a different alcohol wipe to clean the end of the bag tube. Connect the catheter tube to the tube of the clean bag. Attach the clean bag to your leg with tape or a leg strap. Do not make the bag tight on your leg. Wash your hands with soap and water. General rules  Never pull on your catheter. Never try to take it out. Doing that can hurt you. Always wash your hands before and after you touch your catheter or bag. Use a mild, fragrance-free soap. If you do not have soap and water, use hand sanitizer. Always make sure there are no twists or bends (kinks) in the catheter tube. Always make sure there are no leaks in the catheter or bag. Drink enough fluid to keep your pee pale yellow. Do not take baths, swim, or use a hot tub. If you are male, wipe from front to back after you poop (have a bowel movement). Contact a doctor if: Your pee is cloudy. Your pee smells worse than usual. Your catheter gets clogged. Your catheter leaks. Your bladder feels full. Get help right away if: You have redness, swelling, or pain where the catheter goes into your body. You have fluid, blood, pus, or a bad smell coming from  the area where the catheter goes into your body. Your skin feels warm where the catheter goes into your body. You have a fever. You have pain in your: Belly (abdomen). Legs. Lower back. Bladder. You see blood in the catheter. Your pee is pink or red. You feel sick to your stomach (nauseous). You throw up (vomit). You have chills. Your pee is not draining into the bag. Your catheter gets pulled out. Summary An indwelling urinary catheter is a thin tube that is placed into the bladder to help drain pee (urine) out of the body. The catheter  is placed into the part of the body that drains pee from the bladder (urethra). Taking good care of your catheter will keep it working properly and help prevent problems. Always wash your hands before and after touching your catheter or bag. Never pull on your catheter or try to take it out. This information is not intended to replace advice given to you by your health care provider. Make sure you discuss any questions you have with your health care provider. Document Released: 11/08/2012 Document Revised: 11/05/2018 Document Reviewed: 02/27/2017 Elsevier Patient Education  David [name]. Transurethral Resection of the Prostate, Care After This sheet gives you information about how to care for yourself after your procedure. Your health care provider may also give you more specific instructions. If you have problems or questions, contact your health care provider. What can I expect after the procedure? After the procedure, it is common to have: Mild pain in your lower abdomen. Soreness or mild discomfort in your penis from having the catheter inserted during the procedure. A feeling of urgency when you need to urinate. A small amount of blood in your urine. You may notice some small blood clots in your urine. These are normal. Follow these instructions at home: Medicines Take over-the-counter and prescription medicines only as told by your  health care provider. If you were prescribed an antibiotic medicine, take it as told by your health care provider. Do not stop taking the antibiotic even if you start to feel better. Ask your health care provider if the medicine prescribed to you: Requires you to avoid driving or using heavy machinery. Can cause constipation. You may need to take actions to prevent or treat constipation, such as: Take over-the-counter or prescription medicines. Eat foods that are high in fiber, such as fresh fruits and vegetables, whole grains, and beans. Limit foods that are high in fat and processed sugars, such as fried or sweet foods. Do not drive for 24 hours if you were given a sedative during your procedure. Activity  Return to your normal activities as told by your health care provider. Ask your health care provider what activities are safe for you. Do not lift anything that is heavier than 10 lb (4.5 kg), or the limit that you are told, for 3 weeks after the procedure or until your health care provider says that it is safe. Avoid intense physical activity for as long as told by your health care provider. Avoid sitting for a long time without moving. Get up and move around one or more times every few hours. This helps to prevent blood clots. You may increase your physical activity gradually as you start to feel better. Lifestyle Do not drink alcohol for as long as told by your health care provider. This is especially important if you are taking prescription pain medicines. Do not engage in sexual activity until your health care provider says that you can do this. General instructions  Do not take baths, swim, or use a hot tub until your health care provider approves. Drink enough fluid to keep your urine pale yellow. Urinate as soon as you feel the need to. Do not try to hold your urine for long periods of time. If your health care provider approves, you may take a stool softener for 2-3 weeks to  prevent you from straining to have a bowel movement. Wear compression stockings as told by your health care provider. These stockings help to prevent blood clots and reduce swelling in your legs.  Keep all follow-up visits as told by your health care provider. This is important. Contact a health care provider if you have: Difficulty urinating. A fever. Pain that gets worse or does not improve with medicine. Blood in your urine that does not go away after 1 week of resting and drinking more fluids. Swelling in your penis or testicles. Get help right away if: You are unable to urinate. You are having more blood clots in your urine instead of fewer. You have: Large blood clots. A lot of blood in your urine. Pain in your back or lower abdomen. Pain or swelling in your legs. Chills and you are shaking. Difficulty breathing or shortness of breath. Summary After the procedure, it is common to have a small amount of blood in your urine. Avoid heavy lifting and intense physical activity for as long as told by your health care provider. Urinate as soon as you feel the need to. Do not try to hold your urine for long periods of time. Keep all follow-up visits as told by your health care provider. This is important. This information is not intended to replace advice given to you by your health care provider. Make sure you discuss any questions you have with your health care provider. Document Released: 07/14/2005 Document Revised: 11/03/2018 Document Reviewed: 04/14/2018 Elsevier Patient Education  2020 David [name] may see some blood in the urine and may have some burning with the catheter in place for 48-72 hours. You also may notice that you have the sensation to urinate more frequently or urgently after your procedure which is normal.  2. You should call should you develop catheter is not draining, fever > 101, persistent nausea and vomiting that prevents you from eating or drinking to  stay hydrated.  3. If you have a catheter, you will be taught how to take care of the catheter by the nursing staff prior to discharge from the hospital.  You may periodically feel a strong urge to void with the catheter in place.  This is a bladder spasm and most often can occur when having a bowel movement or moving around. It is typically self-limited and usually will stop after a few minutes.  You may use some Vaseline or Neosporin around the tip of the catheter to reduce friction at the tip of the penis. You may also see some blood in the urine.  A very small amount of blood can make the urine look quite red.  As long as the catheter is draining well, there usually is not a problem.  However, if the catheter is not draining well and is bloody, you should call the office [phone]) to notify us.  Start the antibiotic 1 day prior to your clinic appointment.

## 2019-07-07 NOTE — Discharge Summary (Signed)
[facility] Discharge Summary  Admit date: 07/06/2019  Discharge date and time: 07/07/19   Discharge to: Home  Discharge Service: Urology  Discharge Attending Physician:  Dr. Ellison Hughs  Discharge  Diagnoses: BPH  Secondary Diagnosis: Active Problems:   BPH with obstruction/lower urinary tract symptoms   OR Procedures: Procedure(s): TRANSURETHRAL RESECTION OF THE PROSTATE (TURP), BIPOLAR 07/06/2019   Ancillary Procedures: None   Discharge Day Services: The patient was seen and examined by the Urology team both in the morning and immediately prior to discharge. Foley catheter was taken off traction, UOP was clear pale pink off CBI. Vital signs and laboratory values were stable and within normal limits.  The physical exam was benign and unchanged and all surgical wounds were examined.  Discharge instructions were explained and all questions answered.  Subjective  No acute events overnight. Pain Controlled. No fever or chills.  Objective Patient Vitals for the past 8 hrs:  BP Temp Pulse Resp SpO2  07/07/19 0608 133/65 98.7 F (37.1 C) 60 14 92 %  07/07/19 0220 128/64 98.7 F (37.1 C) 64 18 94 %   No intake/output data recorded.  General Appearance:        No acute distress Lungs:                       Normal work of breathing on room air Heart:                                Regular rate and rhythm Abdomen:                         Soft, non-tender, non-distended GU:         Hematuria catheter in place draining pale pink clear urine. Off traction. Extremities:                      Warm and well perfused   Hospital Course:  The patient underwent TURP on 07/06/2019.  The patient tolerated the procedure well, was extubated in the OR, and afterwards was taken to the PACU for routine post-surgical care. When stable the patient was transferred to the floor.   The patient did well postoperatively.  The patients diet was slowly advanced and at the time of discharge was  tolerating a regular diet.  The patient was discharged home 1 Day Post-Op, at which point was tolerating a regular solid diet, was able to care for his catheter, have adequate pain control with P.O. pain medication, and could ambulate without difficulty. The patient will follow up with [place] for post op check on 07/11/19.  Condition at Discharge: Improved  Discharge Medications:  Allergies as of 07/07/2019      Reactions   Gris-peg [griseofulvin] Other (See Comments)   headaches   Terbinafine Other (See Comments)   headache      Medication List    TAKE these medications   acetaminophen 500 MG tablet Commonly known as: TYLENOL Take 1 tablet (500 mg total) by mouth every 4 (four) hours as needed for up to 7 days.   alfuzosin 10 MG 24 hr tablet Commonly known as: UROXATRAL   aspirin EC 81 MG tablet Take 81 mg by mouth daily.   cefdinir 300 MG capsule Commonly known as: OMNICEF Take 1 capsule (300 mg total) by mouth 2 (two) times daily for 3 days. Start  the day before your catheter removal appointment.   ezetimibe-simvastatin 10-40 MG tablet Commonly known as: VYTORIN Take 1 tablet by mouth at bedtime.   fish oil-omega-3 fatty acids 1000 MG capsule Take 1 g by mouth daily.   glimepiride 4 MG tablet Commonly known as: AMARYL Take 1 tablet by mouth daily.   glucosamine-chondroitin 500-400 MG tablet Take 1 tablet by mouth daily.   loratadine 10 MG tablet Commonly known as: CLARITIN Take 10 mg by mouth daily.   multivitamin with minerals tablet Take 1 tablet by mouth daily.   mycophenolate 250 MG capsule Commonly known as: CELLCEPT TAKE 2 CAPSULES BY MOUTH TWICE DAILY   nitroGLYCERIN 0.4 MG SL tablet Commonly known as: NITROSTAT Place 1 tablet (0.4 mg total) under the tongue every 5 (five) minutes as needed for chest pain. Reported on 11/01/2015   predniSONE 1 MG tablet Commonly known as: DELTASONE Take 4 po daily x 10 days, then 3 po daily x 10 days, then 2 po daily x  10 d then 1 po daily x 10 d then stop   pyridostigmine 60 MG tablet Commonly known as: Mestinon Take up to 5 pills   tamsulosin 0.4 MG Caps capsule Commonly known as: FLOMAX Take 0.4 mg by mouth 2 (two) times a day. Reported on 11/01/2015   traZODone 50 MG tablet Commonly known as: DESYREL TAKE 1/2 TO 1 TABLET BY MOUTH EVERY NIGHT AT BEDTIME   triamterene-hydrochlorothiazide 37.5-25 MG tablet Commonly known as: MAXZIDE-25 Take 1 tablet by mouth daily.   VITAMIN D3 PO Take 1 tablet by mouth daily.

## 2019-07-11 DIAGNOSIS — L821 Other seborrheic keratosis: Secondary | ICD-10-CM | POA: Diagnosis not present

## 2019-07-11 DIAGNOSIS — N401 Enlarged prostate with lower urinary tract symptoms: Secondary | ICD-10-CM | POA: Diagnosis not present

## 2019-07-11 DIAGNOSIS — R3912 Poor urinary stream: Secondary | ICD-10-CM | POA: Diagnosis not present

## 2019-07-11 DIAGNOSIS — L570 Actinic keratosis: Secondary | ICD-10-CM | POA: Diagnosis not present

## 2019-07-11 DIAGNOSIS — D1801 Hemangioma of skin and subcutaneous tissue: Secondary | ICD-10-CM | POA: Diagnosis not present

## 2019-07-11 DIAGNOSIS — Z85828 Personal history of other malignant neoplasm of skin: Secondary | ICD-10-CM | POA: Diagnosis not present

## 2019-07-12 DIAGNOSIS — R339 Retention of urine, unspecified: Secondary | ICD-10-CM | POA: Diagnosis not present

## 2019-07-19 ENCOUNTER — Ambulatory Visit: Payer: Medicare Other | Admitting: Plastic Surgery

## 2019-07-28 DIAGNOSIS — R300 Dysuria: Secondary | ICD-10-CM | POA: Diagnosis not present

## 2019-07-28 DIAGNOSIS — I10 Essential (primary) hypertension: Secondary | ICD-10-CM | POA: Diagnosis not present

## 2019-07-28 DIAGNOSIS — R8279 Other abnormal findings on microbiological examination of urine: Secondary | ICD-10-CM | POA: Diagnosis not present

## 2019-07-28 DIAGNOSIS — N3000 Acute cystitis without hematuria: Secondary | ICD-10-CM | POA: Diagnosis not present

## 2019-07-28 DIAGNOSIS — N390 Urinary tract infection, site not specified: Secondary | ICD-10-CM | POA: Diagnosis not present

## 2019-07-28 DIAGNOSIS — R3911 Hesitancy of micturition: Secondary | ICD-10-CM | POA: Diagnosis not present

## 2019-07-28 DIAGNOSIS — Z9079 Acquired absence of other genital organ(s): Secondary | ICD-10-CM | POA: Diagnosis not present

## 2019-07-28 DIAGNOSIS — R197 Diarrhea, unspecified: Secondary | ICD-10-CM | POA: Diagnosis not present

## 2019-08-04 ENCOUNTER — Telehealth: Payer: Self-pay

## 2019-08-04 NOTE — Telephone Encounter (Signed)
Patient called wanting to know if with the medications and health issues he has if there would be any reason he should not take the covid vaccine   Please follow up

## 2019-08-04 NOTE — Telephone Encounter (Signed)
He is ok to get the vaccine

## 2019-08-04 NOTE — Telephone Encounter (Signed)
Dr. Felecia Shelling- is he ok with get the covid-19 vaccine?

## 2019-08-04 NOTE — Telephone Encounter (Signed)
Called pt. Went to VM but call got disconnected and could not LVM. I called right back and went to VM again and call disconnected again.  If pt calls back, please let him know Dr. Felecia Shelling is ok with him getting the vaccine (either Pfzier or Moderna)

## 2019-08-05 ENCOUNTER — Ambulatory Visit (INDEPENDENT_AMBULATORY_CARE_PROVIDER_SITE_OTHER): Payer: Medicare Other | Admitting: *Deleted

## 2019-08-05 ENCOUNTER — Telehealth: Payer: Self-pay | Admitting: Cardiovascular Disease

## 2019-08-05 DIAGNOSIS — I442 Atrioventricular block, complete: Secondary | ICD-10-CM | POA: Diagnosis not present

## 2019-08-05 LAB — CUP PACEART REMOTE DEVICE CHECK
Battery Impedance: 334 Ohm
Battery Remaining Longevity: 98 mo
Battery Voltage: 2.78 V
Brady Statistic AP VP Percent: 3 %
Brady Statistic AP VS Percent: 0 %
Brady Statistic AS VP Percent: 96 %
Brady Statistic AS VS Percent: 1 %
Date Time Interrogation Session: 20210108152043
Implantable Lead Implant Date: 20160329
Implantable Lead Implant Date: 20160329
Implantable Lead Location: 753859
Implantable Lead Location: 753860
Implantable Lead Model: 5076
Implantable Lead Model: 5092
Implantable Pulse Generator Implant Date: 20160329
Lead Channel Impedance Value: 478 Ohm
Lead Channel Impedance Value: 604 Ohm
Lead Channel Pacing Threshold Amplitude: 0.625 V
Lead Channel Pacing Threshold Amplitude: 0.75 V
Lead Channel Pacing Threshold Pulse Width: 0.4 ms
Lead Channel Pacing Threshold Pulse Width: 0.4 ms
Lead Channel Setting Pacing Amplitude: 2 V
Lead Channel Setting Pacing Amplitude: 2.5 V
Lead Channel Setting Pacing Pulse Width: 0.4 ms
Lead Channel Setting Sensing Sensitivity: 2.8 mV

## 2019-08-05 NOTE — Telephone Encounter (Signed)
New message:     Patient calling to help trouble shoot his device. Please call patient.

## 2019-08-05 NOTE — Telephone Encounter (Signed)
I spoke with the pt to let him know we did receive his transmission he sent today.

## 2019-08-08 ENCOUNTER — Telehealth: Payer: Self-pay

## 2019-08-08 NOTE — Telephone Encounter (Signed)

## 2019-08-08 NOTE — Telephone Encounter (Signed)
Called, LVM for pt relaying below info. Provided office number if he has further questions.

## 2019-08-09 ENCOUNTER — Other Ambulatory Visit: Payer: Self-pay

## 2019-08-09 ENCOUNTER — Ambulatory Visit: Payer: Medicare Other | Admitting: Plastic Surgery

## 2019-08-09 ENCOUNTER — Encounter: Payer: Self-pay | Admitting: Plastic Surgery

## 2019-08-09 VITALS — BP 130/78 | HR 77 | Temp 97.5°F | Ht 69.0 in | Wt 198.4 lb

## 2019-08-09 DIAGNOSIS — G7000 Myasthenia gravis without (acute) exacerbation: Secondary | ICD-10-CM

## 2019-08-09 DIAGNOSIS — H02834 Dermatochalasis of left upper eyelid: Secondary | ICD-10-CM

## 2019-08-09 DIAGNOSIS — H02831 Dermatochalasis of right upper eyelid: Secondary | ICD-10-CM | POA: Diagnosis not present

## 2019-08-09 NOTE — Progress Notes (Signed)
   Subjective:    Patient ID: David [name], male    DOB: 1936/03/24, 84 y.o.   MRN: [id number]  Patient is an 84 year old male here for evaluation of his eyelids.  He was seen about a year ago and we had planned on doing upper lid blepharoplasty.  I reviewed the pictures today and with him.  He has a significant change in his upper lids since his last exam.  He has severe ptosis of the upper lids on both sides.  At this point I think he is going to need some muscle work done.  He is 5 feet 9 inches tall and weighs 198 pounds.  He has myasthenia gravis and recently recovered from prostate cancer treatment.  He states that it is now to the point where he is finding it very difficult to see because of the ptosis.  He is no longer able to drive.  He is very active and wants to stay active.   Review of Systems  Constitutional: Positive for activity change. Negative for appetite change.  Eyes: Positive for visual disturbance. Negative for pain and discharge.  Respiratory: Negative.   Cardiovascular: Negative.   Gastrointestinal: Negative for abdominal distention.  Genitourinary: Negative.   Musculoskeletal: Negative.        Objective:   Physical Exam Vitals and nursing note reviewed.  HENT:     Mouth/Throat:     Mouth: Mucous membranes are moist.  Cardiovascular:     Rate and Rhythm: Normal rate.     Pulses: Normal pulses.  Pulmonary:     Effort: Pulmonary effort is normal.  Skin:    Capillary Refill: Capillary refill takes less than 2 seconds.  Neurological:     General: No focal deficit present.     Mental Status: He is alert and oriented to person, place, and time.  Psychiatric:        Mood and Affect: Mood normal.        Behavior: Behavior normal.        Thought Content: Thought content normal.        Judgment: Judgment normal.        Assessment & Plan:     ICD-10-CM   1. Dermatochalasis of both upper eyelids  H02.831    H02.834   2. Myasthenia gravis ([place])  G70.00       I will make a referral to Dr. Isidoro Donning for further evaluation and treatment.  The patient is in agreement.  We discussed that it is more than skin involved.  He has muscle involved and due to his myasthenia he will need a subspecialist.  I have sent a note to Dr. Lorina Rabon as well.  Pictures were obtained of the patient and placed in the chart with the patient's or guardian's permission.  The [facility] [facility] was signed into law in 2016 which includes the topic of electronic health records.  This provides immediate access to information in MyChart.  This includes consultation notes, operative notes, office notes, lab results and pathology reports.  If you have any questions about what you read please let us know at your next visit or call us at the office.  We are right here with you.

## 2019-08-10 DIAGNOSIS — R3912 Poor urinary stream: Secondary | ICD-10-CM | POA: Diagnosis not present

## 2019-08-10 DIAGNOSIS — R300 Dysuria: Secondary | ICD-10-CM | POA: Diagnosis not present

## 2019-08-23 ENCOUNTER — Ambulatory Visit (INDEPENDENT_AMBULATORY_CARE_PROVIDER_SITE_OTHER): Payer: Medicare Other | Admitting: Neurology

## 2019-08-23 ENCOUNTER — Encounter: Payer: Self-pay | Admitting: Neurology

## 2019-08-23 ENCOUNTER — Other Ambulatory Visit: Payer: Self-pay

## 2019-08-23 ENCOUNTER — Other Ambulatory Visit: Payer: Self-pay | Admitting: Urology

## 2019-08-23 VITALS — BP 171/85 | HR 68 | Temp 97.7°F | Ht 69.0 in

## 2019-08-23 DIAGNOSIS — G7000 Myasthenia gravis without (acute) exacerbation: Secondary | ICD-10-CM

## 2019-08-23 DIAGNOSIS — N3000 Acute cystitis without hematuria: Secondary | ICD-10-CM | POA: Diagnosis not present

## 2019-08-23 DIAGNOSIS — Z79899 Other long term (current) drug therapy: Secondary | ICD-10-CM | POA: Diagnosis not present

## 2019-08-23 DIAGNOSIS — R1084 Generalized abdominal pain: Secondary | ICD-10-CM | POA: Diagnosis not present

## 2019-08-23 DIAGNOSIS — N132 Hydronephrosis with renal and ureteral calculous obstruction: Secondary | ICD-10-CM | POA: Diagnosis not present

## 2019-08-23 DIAGNOSIS — M5417 Radiculopathy, lumbosacral region: Secondary | ICD-10-CM

## 2019-08-23 DIAGNOSIS — R269 Unspecified abnormalities of gait and mobility: Secondary | ICD-10-CM

## 2019-08-23 DIAGNOSIS — H02403 Unspecified ptosis of bilateral eyelids: Secondary | ICD-10-CM | POA: Diagnosis not present

## 2019-08-23 DIAGNOSIS — N201 Calculus of ureter: Secondary | ICD-10-CM | POA: Diagnosis not present

## 2019-08-23 DIAGNOSIS — R8279 Other abnormal findings on microbiological examination of urine: Secondary | ICD-10-CM | POA: Diagnosis not present

## 2019-08-23 NOTE — Progress Notes (Signed)
[facility]  PATIENT: David [name] DOB: 17-Feb-1936  REFERRING DOCTOR OR PCP:  [name] (PCP) and Rutherford Guys (Ophtho) SOURCE: Patient, records from Dr. Shelia Media, Lab results, CPAP download  _________________________________   HISTORICAL  CHIEF COMPLAINT:  Chief Complaint  Patient presents with  . Follow-up    RM 13. Last seen 02/16/2019. Dx today with kidney stone today. Being treated for this.     HISTORY OF PRESENT ILLNESS:  David [name]  is a 84 y.o. man with myasthenia gravis, and OSA.     Update 08/23/2019: He feels his myasthenia gravis is a little worse and he is experiencing more ptosis and gait is mildly worse.  He is being referred to Dr. Luretha Murphy (Plastic surgery) for reconstruction to help with the ptosis.  He is taking mestinon 60 mg five to 8 pills a day.    He has a lot of fluctuations day to day and based on the timing of his medication.   He is on Cellcept 500 mg po bid.   He no longer is on prednisone.  Had a conversation about IVIG as another treatment option that sometimes can help myasthenia gravis.  At this time, he would prefer to hold off.  He was just diagnosed with a kidney stone and will be having lithotripsy next Wednesday.  He feels he has done worse this week due to the pain.  He has OSA but prefers not to use CPAP due to comfort.  He feels the back pain and leg weakness are the same or slightly better compared to the last visit.  He has had more pain due to the kidney stone.  Update 02/16/2019 He feels his myasthenia gravis is doing reasonably well.  He does not note any double vision.  He continues to have mild ptosis but this is much improved from where he was before treatment.  He is able to get out of the chair without using his arms.  He is on CellCept and low-dose prednisone.  He asks about stopping the prednisone if possible.  He has experienced significant pain in the back and legs.  He has severe spinal  stenosis at L3-L4 and also has a pars defect with anterolisthesis at L5-S1.  I reviewed the CT myelogram in his presence and showed the pertinent findings to him and his wife.  He has been doing epidural steroid injections every 3 to 4 months with benefit.  We did discuss that surgery could be considered for his symptoms that are likely coming from the spinal stenosis at L3-L4.  We will especially consider this if he gets less and less benefit from the epidurals over time.  CT myelogram 09/22/2017 IMPRESSION: 1. Diffuse lumbar disc and facet degeneration. 2. Multifactorial spinal stenosis at L3-4, severe with standing. Moderate bilateral neural foraminal stenosis. 3. L5 pars defects with grade 1/2 anterolisthesis which slightly increases with standing. Severe bilateral neural foraminal stenosis. 4. Mild-to-moderate left lateral recess stenosis at L2-3. 5.  Aortic Atherosclerosis (ICD10-I70.0).   He has OSA but has trouble using it due to sleeping on his side.   He falls asleep easily.   He often wakes up 2 hours later and then is up/down the rest of the night.   He feels better nights he does not use it.    Update 10/13/2018: He feels his myasthenia gravis.   He is on pyridostigmine 60 mg 5 times a day but usually just takes 4 a day.    He  is tolerating Cellcept 500 mg po bid and is on prednisone 10 mg daily.   He is putting off blepharoplasty for the time being.   No diplopia.   His legs feel a weak at times.   The left foot drop is better.    He has DJD and back pain and has had 2 ESIs that help the pain and slightly helps the walking.   He has spinal stenosis at L3L4 and possible left L3 nerve root compression and L5 pars defect with significant anterolisthesis and bilateral foraminal stenosis.   He now just takes one Aleve daily.  He has had nocturia (with some incontinence).  Alfuzosin has helped    He stopped using CPAP and we discussed it might help the nocturia.    He notes the cold air is  bothersome and the droplets in his tube bothered him with the humidifier.     Update 05/12/2018: His myasthenia gravis is doing better.   He notes his MG improved on combination of higher mestinon and prednisone 40 mg and Cellcept 500 mg bid.  He tolerates the medications well.  The eyelid drooping is much better though it still bothers him and he feels his visual fields are worsened.  He has foot pain bilaterally and note weakness on the left.  This started shortly after his epidural.  However, the more severe back and radiating leg pain has improved.  The right was also weak but improved.   He notes a foot drop with his walking.  He has OSA but has trouble using it due to sleeping on his side.   He falls asleep easily.   He often wakes up 2 hours later and then is up/down the rest of the night.   He feels better nights he does not use it.   His wife notes when he wears it there is no snoring but he snores very loud when he uses it.   I checked a download.  He use the CPAP for 12 the last 13 nights between 30 minutes and 2 hours.  Update 03/26/2018: He is having worsening muscle weakness despite prednisone and mestinon.   He notes his strength is better early in the morning but by mid morning he has more ptosis and his neck becomes weaker.  He takes mestinon 60 mg po qid.   He is taking prednisone 40 mg po qd as well.     He notes neck weakness is still bad after mid morning.  His daughter is a Astronomer and notes that he has trouble eating a meal unless soft.   He notes he tires out easily with meals.    He does best with apple sauce consistency.   With soft food, he does not appear to be aspirating.  He has trouble falling asleep and staying asleep.    He has OSA but has trouble using CPAP.   He had a Tax adviser in [place].   He will bring by the reports as I could not find anything in the EMR.    He has left-sided lumbar radiculopathy (he has L3-L4 spinal stenosis and severe bilateral  foraminal narrowing at L5-S1 with pars defects and anterolisthesis).   He had some benefit from an epidural steroid injection.    Update 02/23/2018: He reports having issues with redness in his eyes, R > L, and was prescribed eye drops.  While this was happening, he had more eyelid drooping.   He had more drooping after the  drops than before the drops.   He saw his ophthalmologist (Dr. Gershon Crane) and then was placed on Tobramycin drops.   The infection has seemd cleared up the last 24 hours.    He is on pyridostigmine 60 mg po tid.   He toleratre  He reports pian in the left > right hamstrings and into the calf.   Sometimes there is just calf pain.   I took another look at his lumbar spine CT myelogram from earlier this year.  It does show multilevel degenerative changes with spinal stenosis at L3-L4 and significant anterolisthesis associated with pars defects at L5-S1 causing severe bilateral neuroforaminal stenosis at that level.  Either of these findings could be related to his symptoms.  He is not interested in surgery at this time and we discussed some other options.  IMPRESSION: 1. Diffuse lumbar disc and facet degeneration. 2. Multifactorial spinal stenosis at L3-4, severe with standing. Moderate bilateral neural foraminal stenosis. 3. L5 pars defects with grade 1/2 anterolisthesis which slightly increases with standing. Severe bilateral neural foraminal stenosis. 4. Mild-to-moderate left lateral recess stenosis at L2-3.   Update 11/06/2017: He is on Mestinon for his MG and he tolerates it well.   He is on only 30 mg 2-3 and symptoms improved.     He had a possible abscess on the left near his left ear and was prescribed clindamycin with benefit.   He then had a similar issue on the right with swelling so was placed on it again, with less benefit.  Due to continued pain, he was prescribed cyclobenzaprine 10/28/17 and felt weaker in his legs.   He only took one pill a day x 5 days      He has  OSA but has not been using his CPAP.  We discussed trying to use it nightly.     Update 05/12/2017:     He feels the MG is doing well.   He takes mestinon 1/2 pill bid and tolerates it well.   He denies diplopia. No proximal weakness.  He has OSA and a new CPAP machine was ordered with a different mask.    He hasn't set it up as the bedroom was moved around (wife had knee surgery).  He snores but does not wake up.   He sleeps 5-6 hours sometimes wakingup once to urinate.   He rarely dozes off unintentionally, only if tired.      _________________________________________ From 12/31/2016  MG:    His ptosis improved with Mestinon. He had trouble tolerating higher dosages but notes continued clinical benefit benefit at a relatively low dose of 30 mg 2-3 times a day (usually takes twice a day but will take a third pill if he could be out of the house).    Higher dose caused diarrhea.    He denies any weakness in the shoulders or hips. He is able to stand up from a chair multiple times without using his arms he can stand up from a squatting position.    Vallecular mass?:   We had checked CT scan of the chest and neck to rule out thymoma.   None was found.   However, there was asymmetry with the possible soft tissue mass involving the right vallecular.      He is not a smoker or drinker or heavy drinker.    He saw Dr. Benjamine Mola of ENT and had a scope and nothing   OSA;  He has OSA ans is on CPAP.  PSG 01/07/99 showed an RDI = 99 and PSG 2004 showed RDI = 78.   He has a new machine since 2013 (Resmed).    He stopped CPAP 2 years ago because of comfort issues.     He occasionally nods off due to EDS but usually does not.      MG History:   He had the onset of ptosis, while traveling in [place] and [place], about 3 weeks ago. He first noted the ptosis while trying to watch a movie in the bus (screen was up high) and noting that the eyelids were covering his vision.  He did note the trip was stressful and he and his  wife both had injuries (he sprained ankle and wife broke shoulder).   Lab work was performed and the acetylcholine binding antibodies were elevated at 1.85 (less than 0.24 normal). 57% were blocking antibodies and 25% or modulating antibodies.   Other lab work was noncontributory.     CT scan of the head showed age-related mild chronic microvascular ischemic change.      REVIEW OF SYSTEMS: Constitutional: No fevers, chills, sweats, or change in appetite Eyes: No visual changes, double vision, eye pain.   He notes some ptosis which reduces his visual fields superiorly Ear, nose and throat: No hearing loss, ear pain, nasal congestion, sore throat Cardiovascular: No chest pain, palpitations Respiratory: No shortness of breath at rest or with exertion.   No wheezes.  He has OSA and is having trouble using CPAP. GastrointestinaI: No nausea, vomiting, diarrhea, abdominal pain, fecal incontinence Genitourinary: No dysuria, urinary retention or frequency.  No nocturia. Musculoskeletal: No neck pain, back pain Integumentary: No rashes. Neurological: as above Psychiatric: No depression at this time.  No anxiety Endocrine: No palpitations, diaphoresis, change in appetite, change in weigh or increased thirst Hematologic/Lymphatic: No anemia, purpura, petechiae. Allergic/Immunologic: No itchy/runny eyes, nasal congestion, recent allergic reactions, rashes  ALLERGIES: Allergies  Allergen Reactions  . Gris-Peg [Griseofulvin] Other (See Comments)    headaches  . Terbinafine Other (See Comments)    headache    HOME MEDICATIONS:  Current Outpatient Medications:  .  alfuzosin (UROXATRAL) 10 MG 24 hr tablet, , Disp: , Rfl:  .  aspirin EC 81 MG tablet, Take 81 mg by mouth daily. , Disp: , Rfl:  .  Cholecalciferol (VITAMIN D3 PO), Take 1 tablet by mouth daily., Disp: , Rfl:  .  ezetimibe-simvastatin (VYTORIN) 10-40 MG per tablet, Take 1 tablet by mouth at bedtime., Disp: , Rfl:  .  fish oil-omega-3  fatty acids 1000 MG capsule, Take 1 g by mouth daily. , Disp: , Rfl:  .  glimepiride (AMARYL) 4 MG tablet, Take 1 tablet by mouth daily., Disp: , Rfl:  .  glucosamine-chondroitin 500-400 MG tablet, Take 1 tablet by mouth daily. , Disp: , Rfl:  .  loratadine (CLARITIN) 10 MG tablet, Take 10 mg by mouth daily., Disp: , Rfl:  .  Multiple Vitamins-Minerals (MULTIVITAMIN WITH MINERALS) tablet, Take 1 tablet by mouth daily., Disp: , Rfl:  .  mycophenolate (CELLCEPT) 250 MG capsule, TAKE 2 CAPSULES BY MOUTH TWICE DAILY, Disp: 120 capsule, Rfl: 5 .  nitroGLYCERIN (NITROSTAT) 0.4 MG SL tablet, Place 1 tablet (0.4 mg total) under the tongue every 5 (five) minutes as needed for chest pain. Reported on 11/01/2015, Disp: 25 tablet, Rfl: 1 .  pyridostigmine (MESTINON) 60 MG tablet, Take up to 5 pills, Disp: 150 tablet, Rfl: 11 .  traZODone (DESYREL) 50 MG tablet, TAKE 1/2 TO  1 TABLET BY MOUTH EVERY NIGHT AT BEDTIME, Disp: 30 tablet, Rfl: 5 .  triamterene-hydrochlorothiazide (MAXZIDE-25) 37.5-25 MG per tablet, Take 1 tablet by mouth daily., Disp: , Rfl:   PAST MEDICAL HISTORY: Past Medical History:  Diagnosis Date  . CAD (coronary artery disease)    previous stent to LAD 2003  . Cancer ([place])    skin  . Hyperlipidemia   . Hypertension   . LBBB (left bundle branch block)   . Myasthenia gravis ([place])   . S/P cardiac pacemaker procedure, placement 10/24/14 Medtronic Adapta L model ADDRL 1  10/25/2014  . Sleep apnea    Does not wear CPAP.    PAST SURGICAL HISTORY: Past Surgical History:  Procedure Laterality Date  . CORONARY ANGIOPLASTY WITH STENT PLACEMENT  03/01/2002   stenting to LAD - Dr. Glade Lloyd  . NM MYOCAR PERF WALL MOTION  11/27/2010   normal  . PERMANENT PACEMAKER INSERTION N/A 10/24/2014   Procedure: PERMANENT PACEMAKER INSERTION;  Surgeon: [name], MD;  Location: [facility] CATH LAB;  Service: Cardiovascular;  Laterality: N/A;  . TONSILLECTOMY    . TRANSURETHRAL RESECTION OF PROSTATE N/A 07/06/2019    Procedure: TRANSURETHRAL RESECTION OF THE PROSTATE (TURP), BIPOLAR;  Surgeon: Ceasar Mons, MD;  Location: [facility];  Service: Urology;  Laterality: N/A;  . US ECHOCARDIOGRAPHY  12/05/2010   proximal septal thickening,mild concentric LVH,mild deptal hypokinesis,RV mildly dilated,LA mildly dilated,mild AI,moderate aortic root dilatation,septal motion c/w conduction abnormality    FAMILY HISTORY: Family History  Problem Relation Age of Onset  . Breast cancer Mother   . Colon cancer Father   . Lung cancer Father     SOCIAL HISTORY:  Social History   Socioeconomic History  . Marital status: Married    Spouse name: Not on file  . Number of children: Not on file  . Years of education: Not on file  . Highest education level: Not on file  Occupational History  . Not on file  Tobacco Use  . Smoking status: Former Smoker    Types: Cigarettes, Cigars    Start date: 04/28/1949    Quit date: 10/27/1982    Years since quitting: 36.8  . Smokeless tobacco: Never Used  Substance and Sexual Activity  . Alcohol use: Yes    Comment: 1-2 drink per week  . Drug use: No  . Sexual activity: Not on file  Other Topics Concern  . Not on file  Social History Narrative  . Not on file   Social Determinants of Health   Financial Resource Strain:   . Difficulty of Paying Living Expenses: Not on file  Food Insecurity:   . Worried About Charity fundraiser in the Last Year: Not on file  . Ran Out of Food in the Last Year: Not on file  Transportation Needs:   . Lack of Transportation (Medical): Not on file  . Lack of Transportation (Non-Medical): Not on file  Physical Activity:   . Days of Exercise per Week: Not on file  . Minutes of Exercise per Session: Not on file  Stress:   . Feeling of Stress : Not on file  Social Connections:   . Frequency of Communication with Friends and Family: Not on file  . Frequency of Social Gatherings with Friends and Family: Not on  file  . Attends Religious Services: Not on file  . Active Member of Clubs or Organizations: Not on file  . Attends Archivist Meetings: Not on file  .  Marital Status: Not on file  Intimate Partner Violence:   . Fear of Current or Ex-Partner: Not on file  . Emotionally Abused: Not on file  . Physically Abused: Not on file  . Sexually Abused: Not on file     PHYSICAL EXAM  Vitals:   08/23/19 1124  BP: (!) 171/85  Pulse: 68  Temp: 97.7 F (36.5 C)  SpO2: 95%  Height: 5\' 9"  (1.753 m)    Body mass index is 29.3 kg/m.     General: The patient is well-developed and well-nourished and in no acute distress   Neurologic Exam  Mental status: The patient is alert and oriented x 3 at the time of the examination. The patient has apparent normal recent and remote memory, with an apparently normal attention span and concentration ability.   Speech is normal.  Cranial nerves: Extraocular muscles were intact even with prolonged upgaze.  He has mild ptosis that worsened with prolonged upgaze.Marland Kitchen   He has slightly reduced neck extension strength.  The voice is strong.. Trapezius is strong.  The tongue is midline, and the patient has symmetric elevation of the soft palate. No obvious hearing deficits are noted.  Motor:  Muscle bulk is normal.   Tone is normal. Strength is  5 / 5 in the arms shoulders and neck.  Strength is 4-/5 in the L5 innervated foot and ankle muscles on the left and 5/5 elsewhere in the left leg.  EHL is 4+/5 on the right and he is 5/5 elsewhere on the right.  He can stand from a seated position 2 times without using his arms.  Sensory: He has reduced sensation in the left foot over the L5 dermatome.  Coordination: Cerebellar testing reveals good finger-nose-finger and mildly reduced left heel-to-shin .  Gait and station: Station is normal.  Stride is reduced and gait is arthritic.  He cannot do a tandem walk. Romberg is negative.   Reflexes: Deep tendon reflexes  are symmetric and normal in the arms and knees but absent at the ankles    DIAGNOSTIC DATA (LABS, IMAGING, TESTING) - I reviewed patient records, labs, notes, testing and imaging myself where available.  Lab Results  Component Value Date   WBC 10.8 02/16/2019   HGB 9.9 (L) 07/07/2019   HCT 33.8 (L) 07/07/2019   MCV 82 02/16/2019   PLT 295 02/16/2019      Component Value Date/Time   NA 137 07/07/2019 0506   NA 141 02/16/2019 1312   K 3.6 07/07/2019 0506   CL 103 07/07/2019 0506   CO2 26 07/07/2019 0506   GLUCOSE 118 (H) 07/07/2019 0506   BUN 11 07/07/2019 0506   BUN 19 02/16/2019 1312   CREATININE 0.47 (L) 07/07/2019 0506   CALCIUM 8.3 (L) 07/07/2019 0506   PROT 6.6 02/16/2019 1312   ALBUMIN 4.5 02/16/2019 1312   AST 26 02/16/2019 1312   ALT 27 02/16/2019 1312   ALKPHOS 51 02/16/2019 1312   BILITOT 0.8 02/16/2019 1312   GFRNONAA >60 07/07/2019 0506   GFRAA >60 07/07/2019 0506   No results found for: CHOL, HDL, LDLCALC, LDLDIRECT, TRIG, CHOLHDL Lab Results  Component Value Date   HGBA1C 6.6 (H) 10/24/2014   No results found for: [id number] Lab Results  Component Value Date   TSH 1.402 10/24/2014       ASSESSMENT AND PLAN    1. Myasthenia gravis ([place])   2. High risk medication use     1.. For myasthenia gravis, he will  continue CellCept 500 mg twice a day and remain off of the prednisone.  Continue pyridostigmine 60 mg 5 to 8 pills a day.  We will check the CBC and LFT.  If symptoms worsen consider IVIG. 2.   Stay active and exercise as tolerated.   3.   He is advised to try to use CPAP nightly.   4.    He will return to see me in 5-6 months but call sooner if he has new or worsening neurologic symptoms.  40-minute face-to-face evaluation with greater than half the time counseling and  coordinating care about his myasthenia gravis and worsening ptosis.  We also discussed some additional treatment options to consider if he has any worsening    Tenia Goh A.  Felecia Shelling, MD, PhD [id number], [id number] PM Certified in Neurology, Clinical Neurophysiology, Sleep Medicine, Pain Medicine and Neuroimaging  [facility] [address] [place], [place] [address] [phone]

## 2019-08-24 ENCOUNTER — Telehealth: Payer: Self-pay | Admitting: *Deleted

## 2019-08-24 ENCOUNTER — Other Ambulatory Visit: Payer: Self-pay

## 2019-08-24 ENCOUNTER — Encounter (HOSPITAL_BASED_OUTPATIENT_CLINIC_OR_DEPARTMENT_OTHER): Payer: Self-pay | Admitting: Urology

## 2019-08-24 LAB — HEPATIC FUNCTION PANEL
ALT: 10 IU/L (ref 0–44)
AST: 18 IU/L (ref 0–40)
Albumin: 4.4 g/dL (ref 3.6–4.6)
Alkaline Phosphatase: 80 IU/L (ref 39–117)
Bilirubin Total: 0.8 mg/dL (ref 0.0–1.2)
Bilirubin, Direct: 0.24 mg/dL (ref 0.00–0.40)
Total Protein: 6.5 g/dL (ref 6.0–8.5)

## 2019-08-24 LAB — CBC WITH DIFFERENTIAL/PLATELET
Basophils Absolute: 0 10*3/uL (ref 0.0–0.2)
Basos: 0 %
EOS (ABSOLUTE): 0 10*3/uL (ref 0.0–0.4)
Eos: 0 %
Hematocrit: 38.8 % (ref 37.5–51.0)
Hemoglobin: 12.1 g/dL — ABNORMAL LOW (ref 13.0–17.7)
Immature Grans (Abs): 0 10*3/uL (ref 0.0–0.1)
Immature Granulocytes: 0 %
Lymphocytes Absolute: 1.1 10*3/uL (ref 0.7–3.1)
Lymphs: 16 %
MCH: 24.5 pg — ABNORMAL LOW (ref 26.6–33.0)
MCHC: 31.2 g/dL — ABNORMAL LOW (ref 31.5–35.7)
MCV: 79 fL (ref 79–97)
Monocytes Absolute: 0.5 10*3/uL (ref 0.1–0.9)
Monocytes: 7 %
Neutrophils Absolute: 5.3 10*3/uL (ref 1.4–7.0)
Neutrophils: 77 %
Platelets: 250 10*3/uL (ref 150–450)
RBC: 4.94 x10E6/uL (ref 4.14–5.80)
RDW: 14.7 % (ref 11.6–15.4)
WBC: 7 10*3/uL (ref 3.4–10.8)

## 2019-08-24 NOTE — Telephone Encounter (Signed)
Called and spoke with pt. Advised labs fine per Dr. Felecia Shelling. He verbalized understanding.

## 2019-08-24 NOTE — Telephone Encounter (Signed)
-----   Message from Britt Bottom, MD sent at 08/24/2019 12:20 PM EST ----- Please let the patient know that the lab work is fine.

## 2019-08-24 NOTE — Progress Notes (Signed)
Spoke with: Barbara and Caz NPO:  After Midnight, no gum, candy, or mints   Arrival time: 0745 AM Labs: Istat 8 AM medications: Loratadine, Cellcept, Pyridostigmine, Oxybutynin Pre op orders: Yes Ride home: Pamala Hurry (wife) [phone]

## 2019-08-26 DIAGNOSIS — R1900 Intra-abdominal and pelvic swelling, mass and lump, unspecified site: Secondary | ICD-10-CM | POA: Diagnosis not present

## 2019-08-27 ENCOUNTER — Other Ambulatory Visit (HOSPITAL_COMMUNITY)
Admission: RE | Admit: 2019-08-27 | Discharge: 2019-08-27 | Disposition: A | Discharge status: Home / Self Care | Payer: Medicare Other | Source: Ambulatory Visit | Attending: Urology | Admitting: Urology

## 2019-08-27 DIAGNOSIS — Z20822 Contact with and (suspected) exposure to covid-19: Secondary | ICD-10-CM | POA: Insufficient documentation

## 2019-08-27 LAB — SARS CORONAVIRUS 2 (TAT 6-24 HRS): SARS Coronavirus 2: NEGATIVE

## 2019-08-30 NOTE — Anesthesia Preprocedure Evaluation (Addendum)
Anesthesia Evaluation  Patient identified by MRN, date of birth, ID band Patient awake    Reviewed: Allergy & Precautions, NPO status , Patient's Chart, lab work & pertinent test results  Airway Mallampati: II  TM Distance: >3 FB Neck ROM: Full    Dental  (+) Teeth Intact, Dental Advisory Given   Pulmonary sleep apnea , former smoker,    Pulmonary exam normal breath sounds clear to auscultation       Cardiovascular hypertension, Pt. on medications + CAD and + Cardiac Stents (LAD 2003)  Normal cardiovascular exam+ dysrhythmias (LBBB) + pacemaker  Rhythm:Regular Rate:Normal     Neuro/Psych Myasthenia gravis  Neuromuscular disease    GI/Hepatic negative GI ROS, Neg liver ROS,   Endo/Other  diabetes, Well Controlled, Type 2  Renal/GU LEFT URETERAL STONE     Musculoskeletal negative musculoskeletal ROS (+)   Abdominal   Peds  Hematology  (+) Blood dyscrasia, anemia ,   Anesthesia Other Findings Day of surgery medications reviewed with the patient.  Reproductive/Obstetrics                            Anesthesia Physical Anesthesia Plan  ASA: III  Anesthesia Plan: General   Post-op Pain Management:    Induction: Intravenous  PONV Risk Score and Plan: 3 and Dexamethasone, Ondansetron and Treatment may vary due to age or medical condition  Airway Management Planned: LMA  Additional Equipment:   Intra-op Plan:   Post-operative Plan: Extubation in OR  Informed Consent: I have reviewed the patients History and Physical, chart, labs and discussed the procedure including the risks, benefits and alternatives for the proposed anesthesia with the patient or authorized representative who has indicated his/her understanding and acceptance.     Dental advisory given  Plan Discussed with: CRNA  Anesthesia Plan Comments:        Anesthesia Quick Evaluation

## 2019-08-31 ENCOUNTER — Encounter (HOSPITAL_BASED_OUTPATIENT_CLINIC_OR_DEPARTMENT_OTHER)
Admission: RE | Disposition: A | Discharge status: Home / Self Care | Payer: Self-pay | Source: Home / Self Care | Attending: Urology

## 2019-08-31 ENCOUNTER — Encounter (HOSPITAL_BASED_OUTPATIENT_CLINIC_OR_DEPARTMENT_OTHER): Payer: Self-pay | Admitting: Urology

## 2019-08-31 ENCOUNTER — Ambulatory Visit (HOSPITAL_BASED_OUTPATIENT_CLINIC_OR_DEPARTMENT_OTHER)
Admission: RE | Admit: 2019-08-31 | Discharge: 2019-08-31 | Disposition: A | Discharge status: Home / Self Care | Payer: Medicare Other | Attending: Urology | Admitting: Urology

## 2019-08-31 ENCOUNTER — Ambulatory Visit (HOSPITAL_BASED_OUTPATIENT_CLINIC_OR_DEPARTMENT_OTHER): Payer: Medicare Other | Admitting: Anesthesiology

## 2019-08-31 ENCOUNTER — Other Ambulatory Visit: Payer: Self-pay

## 2019-08-31 DIAGNOSIS — Z803 Family history of malignant neoplasm of breast: Secondary | ICD-10-CM | POA: Diagnosis not present

## 2019-08-31 DIAGNOSIS — Z955 Presence of coronary angioplasty implant and graft: Secondary | ICD-10-CM | POA: Diagnosis not present

## 2019-08-31 DIAGNOSIS — I700 Atherosclerosis of aorta: Secondary | ICD-10-CM | POA: Diagnosis not present

## 2019-08-31 DIAGNOSIS — I2510 Atherosclerotic heart disease of native coronary artery without angina pectoris: Secondary | ICD-10-CM | POA: Insufficient documentation

## 2019-08-31 DIAGNOSIS — Z85820 Personal history of malignant melanoma of skin: Secondary | ICD-10-CM | POA: Insufficient documentation

## 2019-08-31 DIAGNOSIS — Z888 Allergy status to other drugs, medicaments and biological substances status: Secondary | ICD-10-CM | POA: Diagnosis not present

## 2019-08-31 DIAGNOSIS — I10 Essential (primary) hypertension: Secondary | ICD-10-CM | POA: Insufficient documentation

## 2019-08-31 DIAGNOSIS — Z87442 Personal history of urinary calculi: Secondary | ICD-10-CM | POA: Insufficient documentation

## 2019-08-31 DIAGNOSIS — Z800 Family history of malignant neoplasm of digestive organs: Secondary | ICD-10-CM | POA: Diagnosis not present

## 2019-08-31 DIAGNOSIS — I442 Atrioventricular block, complete: Secondary | ICD-10-CM | POA: Insufficient documentation

## 2019-08-31 DIAGNOSIS — D649 Anemia, unspecified: Secondary | ICD-10-CM | POA: Insufficient documentation

## 2019-08-31 DIAGNOSIS — K5730 Diverticulosis of large intestine without perforation or abscess without bleeding: Secondary | ICD-10-CM | POA: Diagnosis not present

## 2019-08-31 DIAGNOSIS — Z87891 Personal history of nicotine dependence: Secondary | ICD-10-CM | POA: Diagnosis not present

## 2019-08-31 DIAGNOSIS — N210 Calculus in bladder: Secondary | ICD-10-CM | POA: Diagnosis not present

## 2019-08-31 DIAGNOSIS — Z883 Allergy status to other anti-infective agents status: Secondary | ICD-10-CM | POA: Diagnosis not present

## 2019-08-31 DIAGNOSIS — M4800 Spinal stenosis, site unspecified: Secondary | ICD-10-CM | POA: Insufficient documentation

## 2019-08-31 DIAGNOSIS — E785 Hyperlipidemia, unspecified: Secondary | ICD-10-CM | POA: Diagnosis not present

## 2019-08-31 DIAGNOSIS — G7000 Myasthenia gravis without (acute) exacerbation: Secondary | ICD-10-CM | POA: Insufficient documentation

## 2019-08-31 DIAGNOSIS — Z801 Family history of malignant neoplasm of trachea, bronchus and lung: Secondary | ICD-10-CM | POA: Diagnosis not present

## 2019-08-31 DIAGNOSIS — R918 Other nonspecific abnormal finding of lung field: Secondary | ICD-10-CM | POA: Insufficient documentation

## 2019-08-31 DIAGNOSIS — E119 Type 2 diabetes mellitus without complications: Secondary | ICD-10-CM | POA: Insufficient documentation

## 2019-08-31 DIAGNOSIS — Z950 Presence of cardiac pacemaker: Secondary | ICD-10-CM | POA: Diagnosis not present

## 2019-08-31 DIAGNOSIS — I447 Left bundle-branch block, unspecified: Secondary | ICD-10-CM | POA: Insufficient documentation

## 2019-08-31 DIAGNOSIS — G4733 Obstructive sleep apnea (adult) (pediatric): Secondary | ICD-10-CM | POA: Insufficient documentation

## 2019-08-31 DIAGNOSIS — N201 Calculus of ureter: Secondary | ICD-10-CM | POA: Diagnosis not present

## 2019-08-31 HISTORY — DX: Ventricular tachycardia: I47.2

## 2019-08-31 HISTORY — DX: Obstructive sleep apnea (adult) (pediatric): G47.33

## 2019-08-31 HISTORY — DX: Other ventricular tachycardia: I47.29

## 2019-08-31 HISTORY — DX: Spinal stenosis, site unspecified: M48.00

## 2019-08-31 HISTORY — DX: Type 2 diabetes mellitus without complications: E11.9

## 2019-08-31 HISTORY — DX: Personal history of other diseases of the circulatory system: Z86.79

## 2019-08-31 HISTORY — DX: Malignant melanoma of skin, unspecified: C43.9

## 2019-08-31 HISTORY — DX: Personal history of urinary calculi: Z87.442

## 2019-08-31 HISTORY — DX: Presence of cardiac pacemaker: Z95.0

## 2019-08-31 HISTORY — PX: CYSTOSCOPY/URETEROSCOPY/HOLMIUM LASER/STENT PLACEMENT: SHX6546

## 2019-08-31 LAB — POCT I-STAT, CHEM 8
BUN: 12 mg/dL (ref 8–23)
Calcium, Ion: 1.25 mmol/L (ref 1.15–1.40)
Chloride: 101 mmol/L (ref 98–111)
Creatinine, Ser: 0.7 mg/dL (ref 0.61–1.24)
Glucose, Bld: 114 mg/dL — ABNORMAL HIGH (ref 70–99)
HCT: 41 % (ref 39.0–52.0)
Hemoglobin: 13.9 g/dL (ref 13.0–17.0)
Potassium: 3.5 mmol/L (ref 3.5–5.1)
Sodium: 139 mmol/L (ref 135–145)
TCO2: 31 mmol/L (ref 22–32)

## 2019-08-31 SURGERY — CYSTOSCOPY/URETEROSCOPY/HOLMIUM LASER/STENT PLACEMENT
Anesthesia: General | Laterality: Left

## 2019-08-31 MED ORDER — FENTANYL CITRATE (PF) 100 MCG/2ML IJ SOLN
INTRAMUSCULAR | Status: DC | PRN
  Administered 2019-08-31: 25 ug via INTRAVENOUS

## 2019-08-31 MED ORDER — LACTATED RINGERS IV SOLN
INTRAVENOUS | Status: DC
  Administered 2019-08-31: 1000 mL via INTRAVENOUS
  Filled 2019-08-31: qty 1000

## 2019-08-31 MED ORDER — ACETAMINOPHEN 500 MG PO TABS
ORAL_TABLET | ORAL | Status: AC
  Filled 2019-08-31: qty 2

## 2019-08-31 MED ORDER — ACETAMINOPHEN 500 MG PO TABS
1000.0000 mg | ORAL_TABLET | Freq: Once | ORAL | Status: AC
  Administered 2019-08-31: 09:00:00 1000 mg via ORAL
  Filled 2019-08-31: qty 2

## 2019-08-31 MED ORDER — ONDANSETRON HCL 4 MG/2ML IJ SOLN
4.0000 mg | Freq: Once | INTRAMUSCULAR | Status: DC | PRN
  Filled 2019-08-31: qty 2

## 2019-08-31 MED ORDER — BELLADONNA ALKALOIDS-OPIUM 16.2-60 MG RE SUPP
RECTAL | Status: DC | PRN
  Administered 2019-08-31: 1 via RECTAL

## 2019-08-31 MED ORDER — CEFAZOLIN SODIUM-DEXTROSE 2-4 GM/100ML-% IV SOLN
2.0000 g | Freq: Once | INTRAVENOUS | Status: AC
  Administered 2019-08-31: 2 g via INTRAVENOUS
  Filled 2019-08-31: qty 100

## 2019-08-31 MED ORDER — ONDANSETRON HCL 4 MG/2ML IJ SOLN
INTRAMUSCULAR | Status: DC | PRN
  Administered 2019-08-31: 4 mg via INTRAVENOUS

## 2019-08-31 MED ORDER — TRAMADOL HCL 50 MG PO TABS: 50.0000 mg | ORAL_TABLET | Freq: Four times a day (QID) | ORAL | 0 refills | Status: AC | PRN

## 2019-08-31 MED ORDER — CEFAZOLIN SODIUM-DEXTROSE 2-4 GM/100ML-% IV SOLN
INTRAVENOUS | Status: AC
  Filled 2019-08-31: qty 100

## 2019-08-31 MED ORDER — DEXAMETHASONE SODIUM PHOSPHATE 10 MG/ML IJ SOLN
INTRAMUSCULAR | Status: DC | PRN
  Administered 2019-08-31: 10 mg via INTRAVENOUS

## 2019-08-31 MED ORDER — FENTANYL CITRATE (PF) 100 MCG/2ML IJ SOLN
25.0000 ug | INTRAMUSCULAR | Status: DC | PRN
  Filled 2019-08-31: qty 1

## 2019-08-31 MED ORDER — PROPOFOL 10 MG/ML IV BOLUS
INTRAVENOUS | Status: DC | PRN
  Administered 2019-08-31: 120 mg via INTRAVENOUS

## 2019-08-31 MED ORDER — SULFAMETHOXAZOLE-TRIMETHOPRIM 800-160 MG PO TABS: 1.0000 | ORAL_TABLET | Freq: Two times a day (BID) | ORAL | 0 refills | Status: AC

## 2019-08-31 MED ORDER — LIDOCAINE 2% (20 MG/ML) 5 ML SYRINGE
INTRAMUSCULAR | Status: DC | PRN
  Administered 2019-08-31: 80 mg via INTRAVENOUS

## 2019-08-31 SURGICAL SUPPLY — 30 items
APL SKNCLS STERI-STRIP NONHPOA (GAUZE/BANDAGES/DRESSINGS) ×1
BAG DRAIN URO-CYSTO SKYTR STRL (DRAIN) ×3 IMPLANT
BAG DRN UROCATH (DRAIN) ×1
BASKET STONE 1.7 NGAGE (UROLOGICAL SUPPLIES) IMPLANT
BASKET ZERO TIP NITINOL 2.4FR (BASKET) ×3 IMPLANT
BENZOIN TINCTURE PRP APPL 2/3 (GAUZE/BANDAGES/DRESSINGS) ×3 IMPLANT
BSKT STON RTRVL ZERO TP 2.4FR (BASKET) ×1
CATH URET 5FR 28IN OPEN ENDED (CATHETERS) IMPLANT
CLOSURE WOUND 1/2 X4 (GAUZE/BANDAGES/DRESSINGS) ×1
CLOTH BEACON ORANGE TIMEOUT ST (SAFETY) ×3 IMPLANT
DRSG TEGADERM 2-3/8X2-3/4 SM (GAUZE/BANDAGES/DRESSINGS) ×2 IMPLANT
FIBER LASER FLEXIVA 365 (UROLOGICAL SUPPLIES) IMPLANT
FIBER LASER TRAC TIP (UROLOGICAL SUPPLIES) IMPLANT
GLOVE BIO SURGEON STRL SZ7.5 (GLOVE) ×3 IMPLANT
GOWN STRL REUS W/TWL XL LVL3 (GOWN DISPOSABLE) ×3 IMPLANT
GUIDEWIRE STR DUAL SENSOR (WIRE) IMPLANT
GUIDEWIRE ZIPWRE .038 STRAIGHT (WIRE) ×3 IMPLANT
IV NS 1000ML (IV SOLUTION)
IV NS 1000ML BAXH (IV SOLUTION) IMPLANT
IV NS IRRIG 3000ML ARTHROMATIC (IV SOLUTION) ×3 IMPLANT
KIT TURNOVER CYSTO (KITS) ×3 IMPLANT
MANIFOLD NEPTUNE II (INSTRUMENTS) ×3 IMPLANT
NS IRRIG 500ML POUR BTL (IV SOLUTION) ×6 IMPLANT
PACK CYSTO (CUSTOM PROCEDURE TRAY) ×3 IMPLANT
STENT URET 6FRX24 CONTOUR (STENTS) ×2 IMPLANT
STRIP CLOSURE SKIN 1/2X4 (GAUZE/BANDAGES/DRESSINGS) ×1 IMPLANT
SYR 10ML LL (SYRINGE) ×3 IMPLANT
TUBE CONNECTING 12'X1/4 (SUCTIONS)
TUBE CONNECTING 12X1/4 (SUCTIONS) IMPLANT
TUBING UROLOGY SET (TUBING) ×3 IMPLANT

## 2019-08-31 NOTE — Discharge Instructions (Signed)

## 2019-08-31 NOTE — Anesthesia Procedure Notes (Signed)
Procedure Name: LMA Insertion Date/Time: 08/31/2019 9:56 AM Performed by: Sharlette Dense, CRNA Patient Re-evaluated:Patient Re-evaluated prior to induction Preoxygenation: Pre-oxygenation with 100% oxygen Induction Type: IV induction Ventilation: Mask ventilation without difficulty LMA: LMA inserted LMA Size: 4.0 Number of attempts: 1 Tube secured with: Tape Dental Injury: Teeth and Oropharynx as per pre-operative assessment

## 2019-08-31 NOTE — Transfer of Care (Signed)
Immediate Anesthesia Transfer of Care Note  Patient: David [name].  Procedure(s) Performed: CYSTOSCOPY/RETROGRADE/STENT PLACEMENT (Left )  Patient Location: PACU  Anesthesia Type:General  Level of Consciousness: awake, alert  and oriented  Airway & Oxygen Therapy: Patient Spontanous Breathing and Patient connected to face mask oxygen  Post-op Assessment: Report given to RN and Post -op Vital signs reviewed and stable  Post vital signs: Reviewed and stable  Last Vitals:  Vitals Value Taken Time  BP 136/73 08/31/19 1100  Temp 36.5 C 08/31/19 1030  Pulse 59 08/31/19 1106  Resp 18 08/31/19 1106  SpO2 94 % 08/31/19 1106  Vitals shown include unvalidated device data.  Last Pain:  Vitals:   08/31/19 1111  TempSrc:   PainSc: 0-No pain      Patients Stated Pain Goal: 5 ([id number])  Complications: No apparent anesthesia complications

## 2019-08-31 NOTE — Op Note (Signed)
Operative Note  Preoperative diagnosis:  1.  5 mm left UVJ stone  Postoperative diagnosis: 1.  5 mm bladder stone  Procedure(s): 1.  Cystoscopy with diagnostic left ureteroscopy and left JJ stent placement  Surgeon: Ellison Hughs, MD  Assistants:  None  Anesthesia:  General  Complications:  None  EBL: Less than 5 mL  Specimens: 1.  Bladder stone  Drains/Catheters: 1.  Left 6 French, 24 cm JJ stent with tether  Intraoperative findings:   1. Passed left distal ureteral calculus 2. Solitary left collecting system with no filling defects or dilation involving the left ureter or left renal pelvis seen on retrograde pyelogram  Indication:  David [name]. is a 84 y.o. male with a 5 mm left distal ureteral calculus seen on CT from 08/23/2019 and is associated with intermittent episodes of left-sided flank pain.  He has been consented for the above procedures, voices understanding and wishes to proceed.  Description of procedure:  After informed consent was obtained, the patient was brought to the operating room and general LMA anesthesia was administered. The patient was then placed in the dorsolithotomy position and prepped and draped in the usual sterile fashion. A timeout was performed. A 23 French rigid cystoscope was then inserted into the urethral meatus and advanced into the bladder under direct vision. A complete bladder survey revealed no intravesical pathology.  There was a small calculus in the bladder that was evacuated through the cystoscope sheath.  A 5 French ureteral catheter was then inserted into the left ureteral orifice and a retrograde pyelogram was obtained, with the findings listed above.  A Glidewire was then used to intubate the lumen of the ureteral catheter and was advanced up to the left renal pelvis, under fluoroscopic guidance.  The catheter was then removed, leaving the wire in place.  A semirigid ureteroscope was then advanced into the distal  aspects of the left ureter where no obstructing stone could be identified.  The ureteroscope was advanced all the way up to the left renal pelvis and no obstructing calculus was identified.  The semirigid ureteroscope was then removed, leaving the wire in place.  A 6 French, 24 cm JJ stent with a tether was then advanced over the wire and into good position within the left collecting system, confirming placement via fluoroscopy.  The patient's bladder was drained.  The tether of his stent was secured to the dorsal aspect of the penis with benzoin and Steri-Strips.  He tolerated the procedure well and was transferred to the postanesthesia in stable condition.  Plan: The patient has been given instructions to remove his stent at 8 AM on 09/02/2019.

## 2019-08-31 NOTE — H&P (Signed)
Urology Preoperative H&P   Chief Complaint: Left flank pain   History of Present Illness: David [name]. is a 84 y.o. male with with a one week history of intermittent episodes of left sided flank pain.  CTSS from 08/23/19 confirmed the presence of a left UVJ stone associated with hydronephrosis.  He continues to struggle with bladder spasms from his TURP and his kidney stone, but denies dysuria or gross hematuria.  He is here today for definitive stone treatment.    Past Medical History:  Diagnosis Date  . CAD (coronary artery disease)    previous stent to LAD 2003  . Diabetes mellitus without complication ([place])    No longer taking diabetic medications at this time  . History of complete heart block   . History of kidney stones   . Hyperlipidemia   . Hypertension   . LBBB (left bundle branch block)   . Melanoma ([place])   . Myasthenia gravis ([facility])    eyelids, finger, and toes  . NSVT (nonsustained ventricular tachycardia) ([place])   . OSA (obstructive sleep apnea)    Does not wear CPAP.  Marland Kitchen Presence of permanent cardiac pacemaker   . S/P cardiac pacemaker procedure, placement 10/24/14 Medtronic Adapta L model ADDRL 1  10/25/2014  . Spinal stenosis    Severe    Past Surgical History:  Procedure Laterality Date  . COLONOSCOPY    . CORONARY ANGIOPLASTY WITH STENT PLACEMENT  03/01/2002   stenting to LAD - Dr. Glade Lloyd  . NM MYOCAR PERF WALL MOTION  11/27/2010   normal  . PERMANENT PACEMAKER INSERTION N/A 10/24/2014   Procedure: PERMANENT PACEMAKER INSERTION;  Surgeon: [name], MD;  Location: [facility] CATH LAB;  Service: Cardiovascular;  Laterality: N/A;  . TONSILLECTOMY    . TRANSURETHRAL RESECTION OF PROSTATE N/A 07/06/2019   Procedure: TRANSURETHRAL RESECTION OF THE PROSTATE (TURP), BIPOLAR;  Surgeon: Ceasar Mons, MD;  Location: [facility];  Service: Urology;  Laterality: N/A;  . US ECHOCARDIOGRAPHY  12/05/2010   proximal septal thickening,mild concentric  LVH,mild deptal hypokinesis,RV mildly dilated,LA mildly dilated,mild AI,moderate aortic root dilatation,septal motion c/w conduction abnormality    Allergies:  Allergies  Allergen Reactions  . Gris-Peg [Griseofulvin] Other (See Comments)    headaches  . Terbinafine Other (See Comments)    headache    Family History  Problem Relation Age of Onset  . Breast cancer Mother   . Colon cancer Father   . Lung cancer Father     Social History:  reports that he quit smoking about 36 years ago. His smoking use included cigarettes and cigars. He started smoking about 70 years ago. He has never used smokeless tobacco. He reports previous alcohol use. He reports that he does not use drugs.  ROS: A complete review of systems was performed.  All systems are negative except for pertinent findings as noted.  Physical Exam:  Vital signs in last 24 hours: Temp:  [97.3 F (36.3 C)] 97.3 F (36.3 C) (02/03 0841) Pulse Rate:  [82] 82 (02/03 0841) Resp:  [14] 14 (02/03 0841) BP: (134)/(83) 134/83 (02/03 0841) SpO2:  [95 %] 95 % (02/03 0841) Weight:  [85.9 kg] 85.9 kg (02/03 0841) Constitutional:  Alert and oriented, No acute distress Cardiovascular: Regular rate and rhythm, No JVD Respiratory: Normal respiratory effort, Lungs clear bilaterally GI: Abdomen is soft, nontender, nondistended, no abdominal masses GU: No CVA tenderness Lymphatic: No lymphadenopathy Neurologic: Grossly intact, no focal deficits Psychiatric: Normal mood and affect  Laboratory Data:  Recent Labs    08/31/19 0828  HGB 13.9  HCT 41.0    Recent Labs    08/31/19 0828  NA 139  K 3.5  CL 101  GLUCOSE 114*  BUN 12  CREATININE 0.70     Results for orders placed or performed during the hospital encounter of 08/31/19 (from the past 24 hour(s))  I-STAT, chem 8     Status: Abnormal   Collection Time: 08/31/19  8:28 AM  Result Value Ref Range   Sodium 139 135 - 145 mmol/L   Potassium 3.5 3.5 - 5.1 mmol/L    Chloride 101 98 - 111 mmol/L   BUN 12 8 - 23 mg/dL   Creatinine, Ser 0.70 0.61 - 1.24 mg/dL   Glucose, Bld 114 (H) 70 - 99 mg/dL   Calcium, Ion 1.25 1.15 - 1.40 mmol/L   TCO2 31 22 - 32 mmol/L   Hemoglobin 13.9 13.0 - 17.0 g/dL   HCT 41.0 39.0 - 52.0 %   Recent Results (from the past 240 hour(s))  SARS CORONAVIRUS 2 (TAT 6-24 HRS) Nasopharyngeal Nasopharyngeal Swab     Status: None   Collection Time: 08/27/19 11:19 AM   Specimen: Nasopharyngeal Swab  Result Value Ref Range Status   SARS Coronavirus 2 NEGATIVE NEGATIVE Final    Comment: (NOTE) SARS-CoV-2 target nucleic acids are NOT DETECTED. The SARS-CoV-2 RNA is generally detectable in upper and lower respiratory specimens during the acute phase of infection. Negative results do not preclude SARS-CoV-2 infection, do not rule out co-infections with other pathogens, and should not be used as the sole basis for treatment or other patient management decisions. Negative results must be combined with clinical observations, patient history, and epidemiological information. The expected result is Negative. Fact Sheet for Patients: SugarRoll.be Fact Sheet for Healthcare Providers: https://www.woods-mathews.com/ This test is not yet approved or cleared by the [place] FDA and  has been authorized for detection and/or diagnosis of SARS-CoV-2 by FDA under an Emergency Use Authorization (EUA). This EUA will remain  in effect (meaning this test can be used) for the duration of the COVID-19 declaration under Section 56 4(b)(1) of the Act, 21 U.S.C. section 360bbb-3(b)(1), unless the authorization is terminated or revoked sooner. Performed at [facility], [place] [address] [place], [place] [address]     Renal Function: Recent Labs    08/31/19 [id number]  CREATININE 0.70   Estimated Creatinine Clearance: 77.3 mL/min (by C-G formula based on SCr of 0.7 mg/dL).  Radiologic Imaging: CLINICAL  DATA: 84 year old male with history of left-sided flank  pain starting yesterday evening. Nausea. No fever. No history of  urinary tract calculi.   EXAM:  CT ABDOMEN AND PELVIS WITHOUT CONTRAST   TECHNIQUE:  Multidetector CT imaging of the abdomen and pelvis was performed  following the standard protocol without IV contrast.   COMPARISON: No priors.   FINDINGS:  Lower chest: Tiny pulmonary nodules are noted in the right lung base  measuring 3 mm or less in size, nonspecific. Areas of septal  thickening throughout the periphery of the lung bases. Aortic  atherosclerosis. Pacemaker leads in the right atrium and right  ventricle.   Hepatobiliary: 2 small low-attenuation lesions are noted in the  liver, incompletely characterized on today's non-contrast CT  examination, but statistically likely to represent small cysts. No  definite suspicious hepatic lesions are otherwise noted on today's  noncontrast CT examination. Unenhanced appearance of the gallbladder  is normal.   Pancreas: No  definite pancreatic mass or peripancreatic fluid  collections or inflammatory changes are noted on today's noncontrast  CT examination.   Spleen: Unremarkable.   Adrenals/Urinary Tract: At the left ureterovesicular junction (axial  image 68 of series 2) there is a 5 mm calculus which is associated  with mild proximal left hydroureteronephrosis and left perinephric  stranding indicating obstruction. No additional calculi are noted  within the collecting system of either kidney, along the course of  the right ureter or within the lumen of the urinary bladder. No  right hydroureteronephrosis. Low-attenuation lesions in both  kidneys, incompletely characterized on today's non-contrast CT  examination, but statistically likely to represent cysts, largest of  which measures up to 4 cm in the interpolar region of the left  kidney. Unenhanced appearance of the kidneys is otherwise  unremarkable. Urinary  bladder is otherwise normal in appearance.  Bilateral adrenal glands are normal in appearance.   Stomach/Bowel: Unenhanced appearance of the stomach is normal. No  pathologic dilatation of small bowel or colon. Numerous colonic  diverticulae are noted, without surrounding inflammatory changes to  suggest an acute diverticulitis at this time. The appendix is not  confidently identified and may be surgically absent. Regardless,  there are no inflammatory changes noted adjacent to the cecum to  suggest the presence of an acute appendicitis at this time.   Vascular/Lymphatic: Aortic atherosclerosis. No lymphadenopathy noted  in the abdomen or pelvis.   Reproductive: Prostate gland and seminal vesicles are unremarkable  in appearance.   Other: No significant volume of ascites. No pneumoperitoneum.   Musculoskeletal: Bilateral pars defects at L5 with 7 mm of  anterolisthesis of L5 upon S1. There are no aggressive appearing  lytic or blastic lesions noted in the visualized portions of the  skeleton.   IMPRESSION:  1. 5 mm calculus at the left ureterovesicular junction with mild  proximal left hydroureteronephrosis and perinephric stranding  indicating mild obstruction.  2. Extensive colonic diverticulosis without evidence of acute  diverticulitis at this time.  3. Septal thickening in the visualized lung bases suggesting  underlying interstitial lung disease. Outpatient referral to  Pulmonology for further evaluation should be considered for further  clinical evaluation.  4. Small pulmonary nodules measuring 3 mm or less in the right lower  lobe, nonspecific but statistically likely benign. No follow-up  needed if patient is low-risk (and has no known or suspected primary  neoplasm). Non-contrast chest CT can be considered in 12 months if  patient is high-risk. This recommendation follows the consensus  statement: Guidelines for Management of Incidental Pulmonary Nodules  Detected on  CT Images: From the Fleischner Society 2017; Radiology  2017; 284:228-243.  5. Aortic atherosclerosis.  6. Additional incidental findings, as above.    Electronically Signed  By: Vinnie Langton M.D.  On: 08/23/2019 13:27   I independently reviewed the above imaging studies.  Assessment and Plan Mcdaniel L Miron Letts. is a 84 y.o. male with a 5 mm left UVJ stone with hydronephrosis  The risks, benefits and alternatives of cystoscopy with LEFT ureteroscopy, laser lithotripsy and ureteral stent placement was discussed the patient.  Risks included, but are not limited to: bleeding, urinary tract infection, ureteral injury/avulsion, ureteral stricture formation, retained stone fragments, the possibility that multiple surgeries may be required to treat the stone(s), MI, stroke, PE and the inherent risks of general anesthesia.  The patient voices understanding and wishes to proceed.      Ellison Hughs, MD 08/31/2019, 9:36 AM  [facility]  Pager: [phone]

## 2019-08-31 NOTE — Anesthesia Postprocedure Evaluation (Signed)
Anesthesia Post Note  Patient: Aquiles Abresch.  Procedure(s) Performed: CYSTOSCOPY/RETROGRADE/STENT PLACEMENT (Left )     Patient location during evaluation: PACU Anesthesia Type: General Level of consciousness: awake and alert Pain management: pain level controlled Vital Signs Assessment: post-procedure vital signs reviewed and stable Respiratory status: spontaneous breathing, nonlabored ventilation and respiratory function stable Cardiovascular status: blood pressure returned to baseline and stable Postop Assessment: no apparent nausea or vomiting Anesthetic complications: no    Last Vitals:  Vitals:   08/31/19 1045 08/31/19 1100  BP: (!) 142/74 136/73  Pulse: (!) 58 (!) 59  Resp: 14 18  Temp:    SpO2: 93% 98%    Last Pain:  Vitals:   08/31/19 1215  TempSrc:   PainSc: 0-No pain                 Catalina Gravel

## 2019-09-02 DIAGNOSIS — H02132 Senile ectropion of right lower eyelid: Secondary | ICD-10-CM | POA: Diagnosis not present

## 2019-09-02 DIAGNOSIS — G7000 Myasthenia gravis without (acute) exacerbation: Secondary | ICD-10-CM | POA: Diagnosis not present

## 2019-09-02 DIAGNOSIS — H02135 Senile ectropion of left lower eyelid: Secondary | ICD-10-CM | POA: Diagnosis not present

## 2019-09-02 DIAGNOSIS — H02433 Paralytic ptosis of bilateral eyelids: Secondary | ICD-10-CM | POA: Diagnosis not present

## 2019-09-05 DIAGNOSIS — G7000 Myasthenia gravis without (acute) exacerbation: Secondary | ICD-10-CM | POA: Diagnosis not present

## 2019-09-05 DIAGNOSIS — H02433 Paralytic ptosis of bilateral eyelids: Secondary | ICD-10-CM | POA: Diagnosis not present

## 2019-09-05 DIAGNOSIS — H02132 Senile ectropion of right lower eyelid: Secondary | ICD-10-CM | POA: Diagnosis not present

## 2019-09-05 DIAGNOSIS — H02135 Senile ectropion of left lower eyelid: Secondary | ICD-10-CM | POA: Diagnosis not present

## 2019-09-07 DIAGNOSIS — H53481 Generalized contraction of visual field, right eye: Secondary | ICD-10-CM | POA: Diagnosis not present

## 2019-09-07 DIAGNOSIS — H53483 Generalized contraction of visual field, bilateral: Secondary | ICD-10-CM | POA: Diagnosis not present

## 2019-09-07 DIAGNOSIS — H53482 Generalized contraction of visual field, left eye: Secondary | ICD-10-CM | POA: Diagnosis not present

## 2019-09-16 ENCOUNTER — Ambulatory Visit: Payer: Self-pay

## 2019-09-16 ENCOUNTER — Telehealth: Payer: Self-pay | Admitting: *Deleted

## 2019-09-16 NOTE — Telephone Encounter (Signed)
   Primary Cardiologist: [name], MD  Chart reviewed as part of pre-operative protocol coverage. Patient was contacted 09/16/2019 in reference to pre-operative risk assessment for pending surgery as outlined below.  David [name]. was last seen on 01/03/2019 by Dr. Rayann Heman via a telemedicine visit.  Since that day, David [name]. has done fine from a cardiac standpoint. He has chronic DOE which is unchanged since his last visit. He is still working. He can complete 4 MET's without anginal complaints.  Therefore, based on ACC/AHA guidelines, the patient would be at acceptable risk for the planned procedure without further cardiovascular testing.   He reports he has been off his cardiac medications since 06/2019, including aspirin, vytorin, and maxzide. He does have occasional orthostatic hypotension so agree with holding maxzide, however he was encouraged to restart his aspirin and vytorin. He agree's to restart these medications following surgery. He was encouraged to scheduled a visit with Dr. Rayann Heman given recent medication changes.  I will route this recommendation to the requesting party via Epic fax function and remove from pre-op pool.  Please call with questions.  Abigail Butts, PA-C 09/16/2019, 4:25 PM

## 2019-09-16 NOTE — Telephone Encounter (Signed)
Patient called stating that he has been fully vaccinated against COVID-19. His 2nd dose was 1 week and 3 days ago. He was exposed to a co-worker 2 days ago who tested positive.  He was advised that he was not fully protected until 14day after his 2nd vaccine. He should self quarantine for 14 days and monitor for symptoms. He was reminded that he can still potentially be a carrier. He should practice the 3 w's.  He states that he would like to test. He was informed that if he wishes to test to wait at least 5 day after his exposure and monitor for symptoms. Symptoms can start 2-14 days post exposure  He verbalized understanding of all information.  Reason for Disposition . General information question, no triage required and triager able to answer question  Answer Assessment - Initial Assessment Questions 1. REASON FOR CALL or QUESTION: "What is your reason for calling today?" or "How can I best help you?" or "What question do you have that I can help answer?"     I have had my second COVID-19 vaccine and have been exposed to COVID-19. My 2nd vaccine was 1 week and 3 days ago.  And I was exposed 2 days ago. Should I be tested. Am I safe?  Protocols used: INFORMATION ONLY CALL - NO TRIAGE-A-AH

## 2019-09-16 NOTE — Telephone Encounter (Signed)
   [facility] Pre-operative Risk Assessment    Request for surgical clearance:  1. What type of surgery is being performed?  BILATERAL UPPER EYELID MYASTHENIA BLEPHAROPTOSIS REPAIR WITH MECHANICAL ENTROPION REPAIR AND BILATERAL UPPER EYELID BLEPHAROPLASTY   2. When is this surgery scheduled?  10/03/2019   3. What type of clearance is required (medical clearance vs. Pharmacy clearance to hold med vs. Both)?  BOTH  4. Are there any medications that need to be held prior to surgery and how long? ASPIRIN   5. Practice name and name of physician performing surgery?  [facility]   6. What is your office phone number [phone]    7.   What is your office fax number [phone]  8.   Anesthesia type (None, local, MAC, general) ?  HAVE LEFT A MESSAGE FOR SURGEON'S OFFICE TO CALL WITH INFORMATION OF ANESTHESIA   Jeanann Lewandowsky 09/16/2019, 11:47 AM  _________________________________________________________________   (provider comments below)

## 2019-09-19 DIAGNOSIS — Z20828 Contact with and (suspected) exposure to other viral communicable diseases: Secondary | ICD-10-CM | POA: Diagnosis not present

## 2019-09-20 NOTE — Telephone Encounter (Signed)
New message   Patient will be under MAC anesthesia. Please call if have any other questions.

## 2019-09-21 DIAGNOSIS — R197 Diarrhea, unspecified: Secondary | ICD-10-CM | POA: Diagnosis not present

## 2019-09-24 DIAGNOSIS — Z20828 Contact with and (suspected) exposure to other viral communicable diseases: Secondary | ICD-10-CM | POA: Diagnosis not present

## 2019-10-03 DIAGNOSIS — H02413 Mechanical ptosis of bilateral eyelids: Secondary | ICD-10-CM | POA: Diagnosis not present

## 2019-10-03 DIAGNOSIS — H02423 Myogenic ptosis of bilateral eyelids: Secondary | ICD-10-CM | POA: Diagnosis not present

## 2019-10-03 DIAGNOSIS — H02832 Dermatochalasis of right lower eyelid: Secondary | ICD-10-CM | POA: Diagnosis not present

## 2019-10-03 DIAGNOSIS — Q100 Congenital ptosis: Secondary | ICD-10-CM | POA: Diagnosis not present

## 2019-10-03 DIAGNOSIS — H02112 Cicatricial ectropion of right lower eyelid: Secondary | ICD-10-CM | POA: Diagnosis not present

## 2019-10-03 DIAGNOSIS — H02834 Dermatochalasis of left upper eyelid: Secondary | ICD-10-CM | POA: Diagnosis not present

## 2019-10-03 DIAGNOSIS — H02433 Paralytic ptosis of bilateral eyelids: Secondary | ICD-10-CM | POA: Diagnosis not present

## 2019-10-05 ENCOUNTER — Other Ambulatory Visit: Payer: Self-pay | Admitting: Neurology

## 2019-10-14 ENCOUNTER — Telehealth: Payer: Self-pay | Admitting: Neurology

## 2019-10-14 NOTE — Telephone Encounter (Signed)
Pt has called to report that he has just finished with his eyelid surgery.  Pt wants to know if Dr Felecia Shelling is wanting to see him earlier.  Please call

## 2019-10-17 NOTE — Telephone Encounter (Signed)
Dr. Felecia Shelling- please advise. He has a follow up in July

## 2019-10-17 NOTE — Telephone Encounter (Signed)
Called, LVM relaying Dr. Garth Bigness message. Advised him to keep f/u in July. Provided office number if he has further questions.

## 2019-10-17 NOTE — Telephone Encounter (Signed)
It would be okay for him to drive.  He should make sure to only drive in good weather, preferably in the daylight.

## 2019-10-17 NOTE — Telephone Encounter (Addendum)
Called pt back. He wants to start driving again. No one advised him to stop driving he just stopped to be safe.   States eyelid surgery went well. Has f/u in 2 weeks post op with surgeon. Wondering if Dr. Felecia Shelling feels it is ok for him to start driving or if he needs to see him first.

## 2019-10-17 NOTE — Telephone Encounter (Signed)
If new issues, we can try to move up to next couple weeks.   If stable, July is fine

## 2019-10-20 DIAGNOSIS — Z0120 Encounter for dental examination and cleaning without abnormal findings: Secondary | ICD-10-CM | POA: Diagnosis not present

## 2019-11-01 DIAGNOSIS — R300 Dysuria: Secondary | ICD-10-CM | POA: Diagnosis not present

## 2019-11-01 DIAGNOSIS — Z87442 Personal history of urinary calculi: Secondary | ICD-10-CM | POA: Diagnosis not present

## 2019-11-01 DIAGNOSIS — N3941 Urge incontinence: Secondary | ICD-10-CM | POA: Diagnosis not present

## 2019-11-01 DIAGNOSIS — R8279 Other abnormal findings on microbiological examination of urine: Secondary | ICD-10-CM | POA: Diagnosis not present

## 2019-11-01 DIAGNOSIS — N401 Enlarged prostate with lower urinary tract symptoms: Secondary | ICD-10-CM | POA: Diagnosis not present

## 2019-11-04 ENCOUNTER — Ambulatory Visit (INDEPENDENT_AMBULATORY_CARE_PROVIDER_SITE_OTHER): Payer: Medicare Other | Admitting: *Deleted

## 2019-11-04 DIAGNOSIS — I442 Atrioventricular block, complete: Secondary | ICD-10-CM

## 2019-11-08 ENCOUNTER — Other Ambulatory Visit: Payer: Self-pay | Admitting: Neurology

## 2019-11-08 LAB — CUP PACEART REMOTE DEVICE CHECK
Battery Impedance: 384 Ohm
Battery Remaining Longevity: 93 mo
Battery Voltage: 2.78 V
Brady Statistic AP VP Percent: 3 %
Brady Statistic AP VS Percent: 0 %
Brady Statistic AS VP Percent: 96 %
Brady Statistic AS VS Percent: 1 %
Date Time Interrogation Session: 20210413080908
Implantable Lead Implant Date: 20160329
Implantable Lead Implant Date: 20160329
Implantable Lead Location: 753859
Implantable Lead Location: 753860
Implantable Lead Model: 5076
Implantable Lead Model: 5092
Implantable Pulse Generator Implant Date: 20160329
Lead Channel Impedance Value: 431 Ohm
Lead Channel Impedance Value: 575 Ohm
Lead Channel Pacing Threshold Amplitude: 0.625 V
Lead Channel Pacing Threshold Amplitude: 0.625 V
Lead Channel Pacing Threshold Pulse Width: 0.4 ms
Lead Channel Pacing Threshold Pulse Width: 0.4 ms
Lead Channel Setting Pacing Amplitude: 2 V
Lead Channel Setting Pacing Amplitude: 2.5 V
Lead Channel Setting Pacing Pulse Width: 0.4 ms
Lead Channel Setting Sensing Sensitivity: 2.8 mV

## 2019-11-09 ENCOUNTER — Telehealth: Payer: Self-pay | Admitting: Neurology

## 2019-11-09 MED ORDER — TRAZODONE HCL 50 MG PO TABS: ORAL_TABLET | ORAL | 5 refills | Status: DC

## 2019-11-09 NOTE — Telephone Encounter (Signed)
1) Medication(s) Requested (by name):  traZODone HCl 50 MG TAKE 1/2 TO 1 TABLET BY MOUTH EVERY NIGHT AT BEDTIME  2) Pharmacy of Choice: [facility], [place] - [address] AT [place]  3) Special Requests:  Pt also requested a CB to discuss some knee buckling he's been experiencing states he had a fall last night

## 2019-11-09 NOTE — Telephone Encounter (Signed)
Lets see if we can get him in sometime in next week or two

## 2019-11-09 NOTE — Progress Notes (Signed)
PPM Remote  

## 2019-11-09 NOTE — Telephone Encounter (Signed)
Called pt back. He stopped trazodone when he had surgery around 06/2019. He has restarted med. He is requesting a refill on this medication. I e-scribed.  Also reported that his knees are buckling.  Started about a few weeks ago. Denies hitting head or severe injuries from fall yesterday. Verified he is taking mycophenolate 250mg  (2 caps po BID) and pyridostigmine 60mg  (takes 5-8 per day). Wanting to know if he needs to come in to see Dr. Felecia Shelling or what he would recommend. Advised I will send message to MD and call back with recommendation.

## 2019-11-09 NOTE — Telephone Encounter (Signed)
Called pt and scheduled f/u for 11/17/19 at 1:30pm with Dr. Felecia Shelling. Asked that he check in by 1:00pm, make sure to wear a mask. He verbalized understanding.

## 2019-11-14 DIAGNOSIS — M25561 Pain in right knee: Secondary | ICD-10-CM | POA: Diagnosis not present

## 2019-11-17 ENCOUNTER — Ambulatory Visit: Payer: Medicare Other | Admitting: Neurology

## 2019-11-17 ENCOUNTER — Encounter: Payer: Self-pay | Admitting: Neurology

## 2019-11-17 ENCOUNTER — Other Ambulatory Visit: Payer: Self-pay

## 2019-11-17 VITALS — BP 156/81 | HR 75 | Temp 96.9°F | Ht 69.0 in | Wt 176.5 lb

## 2019-11-17 DIAGNOSIS — Z79899 Other long term (current) drug therapy: Secondary | ICD-10-CM | POA: Diagnosis not present

## 2019-11-17 DIAGNOSIS — G7000 Myasthenia gravis without (acute) exacerbation: Secondary | ICD-10-CM | POA: Diagnosis not present

## 2019-11-17 DIAGNOSIS — H02403 Unspecified ptosis of bilateral eyelids: Secondary | ICD-10-CM

## 2019-11-17 DIAGNOSIS — R200 Anesthesia of skin: Secondary | ICD-10-CM

## 2019-11-17 DIAGNOSIS — R269 Unspecified abnormalities of gait and mobility: Secondary | ICD-10-CM | POA: Diagnosis not present

## 2019-11-17 DIAGNOSIS — R350 Frequency of micturition: Secondary | ICD-10-CM | POA: Insufficient documentation

## 2019-11-17 DIAGNOSIS — G4733 Obstructive sleep apnea (adult) (pediatric): Secondary | ICD-10-CM

## 2019-11-17 MED ORDER — TRAZODONE HCL 50 MG PO TABS: ORAL_TABLET | ORAL | 5 refills | Status: DC

## 2019-11-17 MED ORDER — DESMOPRESSIN ACETATE 0.1 MG PO TABS: 0.1000 mg | ORAL_TABLET | Freq: Every day | ORAL | 5 refills | Status: DC

## 2019-11-17 NOTE — Progress Notes (Signed)
[facility]  PATIENT: David [name]. DOB: 09/27/1935  REFERRING DOCTOR OR PCP:  [name] (PCP) and Rutherford Guys (Ophtho) SOURCE: Patient, records from Dr. Shelia Media, Lab results, CPAP download  _________________________________   HISTORICAL  CHIEF COMPLAINT:  Chief Complaint  Patient presents with  . Follow-up    RM 12. Last seen 08/23/2019. He is here to be evaluated. Knees have started to buckle within the last 3-4 weeks   . Myasthenia Gravis    Takes cellcept 500mg  po BID, mestinon 60mg  (5-8 per day)  . Gait Problem    Ambulates with cane  . Insomnia    Takes trazodone    HISTORY OF PRESENT ILLNESS:  David [name]  is a 84 y.o. man with myasthenia gravis, and OSA.     Update 11/17/2019: He has ptosis and some weakness but has generally been stable on Mestinon 60 mg 5 to 8 tablets a day in split dose.  Additionally he is on CellCept 500 mg twice daily.  He has bladder urgency helped a little bit with oxybutynin.  However, with the oxybutynin he felt his legs would buckle more due to weakness.  Often he can't get to the bathroom in time.  He has 3-6 x nocturia a night and occasional incontinence  He has OSA but is not able to tolerate CPAP .  We discussed it may help nocturia.   Update 08/23/2019: He feels his myasthenia gravis is a little worse and he is experiencing more ptosis and gait is mildly worse.  He is being referred to Dr. Luretha Murphy (Plastic surgery) for reconstruction to help with the ptosis.  He is taking mestinon 60 mg five to 8 pills a day.    He has a lot of fluctuations day to day and based on the timing of his medication.   He is on Cellcept 500 mg po bid.   He no longer is on prednisone.  Had a conversation about IVIG as another treatment option that sometimes can help myasthenia gravis.  At this time, he would prefer to hold off.  He was just diagnosed with a kidney stone and will be having lithotripsy next Wednesday.  He  feels he has done worse this week due to the pain.  He has OSA but prefers not to use CPAP due to comfort.  He feels the back pain and leg weakness are the same or slightly better compared to the last visit.  He has had more pain due to the kidney stone.  Update 02/16/2019 He feels his myasthenia gravis is doing reasonably well.  He does not note any double vision.  He continues to have mild ptosis but this is much improved from where he was before treatment.  He is able to get out of the chair without using his arms.  He is on CellCept and low-dose prednisone.  He asks about stopping the prednisone if possible.  He has experienced significant pain in the back and legs.  He has severe spinal stenosis at L3-L4 and also has a pars defect with anterolisthesis at L5-S1.  I reviewed the CT myelogram in his presence and showed the pertinent findings to him and his wife.  He has been doing epidural steroid injections every 3 to 4 months with benefit.  We did discuss that surgery could be considered for his symptoms that are likely coming from the spinal stenosis at L3-L4.  We will especially consider this if he gets less and less benefit  from the epidurals over time.  CT myelogram 09/22/2017 IMPRESSION: 1. Diffuse lumbar disc and facet degeneration. 2. Multifactorial spinal stenosis at L3-4, severe with standing. Moderate bilateral neural foraminal stenosis. 3. L5 pars defects with grade 1/2 anterolisthesis which slightly increases with standing. Severe bilateral neural foraminal stenosis. 4. Mild-to-moderate left lateral recess stenosis at L2-3. 5.  Aortic Atherosclerosis (ICD10-I70.0).   He has OSA but has trouble using it due to sleeping on his side.   He falls asleep easily.   He often wakes up 2 hours later and then is up/down the rest of the night.   He feels better nights he does not use it.    Update 10/13/2018: He feels his myasthenia gravis.   He is on pyridostigmine 60 mg 5 times a day but  usually just takes 4 a day.    He is tolerating Cellcept 500 mg po bid and is on prednisone 10 mg daily.   He is putting off blepharoplasty for the time being.   No diplopia.   His legs feel a weak at times.   The left foot drop is better.    He has DJD and back pain and has had 2 ESIs that help the pain and slightly helps the walking.   He has spinal stenosis at L3L4 and possible left L3 nerve root compression and L5 pars defect with significant anterolisthesis and bilateral foraminal stenosis.   He now just takes one Aleve daily.  He has had nocturia (with some incontinence).  Alfuzosin has helped    He stopped using CPAP and we discussed it might help the nocturia.    He notes the cold air is bothersome and the droplets in his tube bothered him with the humidifier.     Update 05/12/2018: His myasthenia gravis is doing better.   He notes his MG improved on combination of higher mestinon and prednisone 40 mg and Cellcept 500 mg bid.  He tolerates the medications well.  The eyelid drooping is much better though it still bothers him and he feels his visual fields are worsened.  He has foot pain bilaterally and note weakness on the left.  This started shortly after his epidural.  However, the more severe back and radiating leg pain has improved.  The right was also weak but improved.   He notes a foot drop with his walking.  He has OSA but has trouble using it due to sleeping on his side.   He falls asleep easily.   He often wakes up 2 hours later and then is up/down the rest of the night.   He feels better nights he does not use it.   His wife notes when he wears it there is no snoring but he snores very loud when he uses it.   I checked a download.  He use the CPAP for 12 the last 13 nights between 30 minutes and 2 hours.  Update 03/26/2018: He is having worsening muscle weakness despite prednisone and mestinon.   He notes his strength is better early in the morning but by mid morning he has more ptosis  and his neck becomes weaker.  He takes mestinon 60 mg po qid.   He is taking prednisone 40 mg po qd as well.     He notes neck weakness is still bad after mid morning.  His daughter is a Astronomer and notes that he has trouble eating a meal unless soft.   He notes he tires out  easily with meals.    He does best with apple sauce consistency.   With soft food, he does not appear to be aspirating.  He has trouble falling asleep and staying asleep.    He has OSA but has trouble using CPAP.   He had a Tax adviser in [place].   He will bring by the reports as I could not find anything in the EMR.    He has left-sided lumbar radiculopathy (he has L3-L4 spinal stenosis and severe bilateral foraminal narrowing at L5-S1 with pars defects and anterolisthesis).   He had some benefit from an epidural steroid injection.    Update 02/23/2018: He reports having issues with redness in his eyes, R > L, and was prescribed eye drops.  While this was happening, he had more eyelid drooping.   He had more drooping after the drops than before the drops.   He saw his ophthalmologist (Dr. Gershon Crane) and then was placed on Tobramycin drops.   The infection has seemd cleared up the last 24 hours.    He is on pyridostigmine 60 mg po tid.   He toleratre  He reports pian in the left > right hamstrings and into the calf.   Sometimes there is just calf pain.   I took another look at his lumbar spine CT myelogram from earlier this year.  It does show multilevel degenerative changes with spinal stenosis at L3-L4 and significant anterolisthesis associated with pars defects at L5-S1 causing severe bilateral neuroforaminal stenosis at that level.  Either of these findings could be related to his symptoms.  He is not interested in surgery at this time and we discussed some other options.  IMPRESSION: 1. Diffuse lumbar disc and facet degeneration. 2. Multifactorial spinal stenosis at L3-4, severe with standing. Moderate bilateral  neural foraminal stenosis. 3. L5 pars defects with grade 1/2 anterolisthesis which slightly increases with standing. Severe bilateral neural foraminal stenosis. 4. Mild-to-moderate left lateral recess stenosis at L2-3.   Update 11/06/2017: He is on Mestinon for his MG and he tolerates it well.   He is on only 30 mg 2-3 and symptoms improved.     He had a possible abscess on the left near his left ear and was prescribed clindamycin with benefit.   He then had a similar issue on the right with swelling so was placed on it again, with less benefit.  Due to continued pain, he was prescribed cyclobenzaprine 10/28/17 and felt weaker in his legs.   He only took one pill a day x 5 days      He has OSA but has not been using his CPAP.  We discussed trying to use it nightly.     Update 05/12/2017:     He feels the MG is doing well.   He takes mestinon 1/2 pill bid and tolerates it well.   He denies diplopia. No proximal weakness.  He has OSA and a new CPAP machine was ordered with a different mask.    He hasn't set it up as the bedroom was moved around (wife had knee surgery).  He snores but does not wake up.   He sleeps 5-6 hours sometimes wakingup once to urinate.   He rarely dozes off unintentionally, only if tired.      _________________________________________ From 12/31/2016  MG:    His ptosis improved with Mestinon. He had trouble tolerating higher dosages but notes continued clinical benefit benefit at a relatively low dose of 30 mg 2-3 times a  day (usually takes twice a day but will take a third pill if he could be out of the house).    Higher dose caused diarrhea.    He denies any weakness in the shoulders or hips. He is able to stand up from a chair multiple times without using his arms he can stand up from a squatting position.    Vallecular mass?:   We had checked CT scan of the chest and neck to rule out thymoma.   None was found.   However, there was asymmetry with the possible soft tissue  mass involving the right vallecular.      He is not a smoker or drinker or heavy drinker.    He saw Dr. Benjamine Mola of ENT and had a scope and nothing   OSA;  He has OSA ans is on CPAP.    PSG 01/07/99 showed an RDI = 99 and PSG 2004 showed RDI = 78.   He has a new machine since 2013 ([employer]).    He stopped CPAP 2 years ago because of comfort issues.     He occasionally nods off due to EDS but usually does not.      MG History:   He had the onset of ptosis, while traveling in [place] and [place], about 3 weeks ago. He first noted the ptosis while trying to watch a movie in the bus (screen was up high) and noting that the eyelids were covering his vision.  He did note the trip was stressful and he and his wife both had injuries (he sprained ankle and wife broke shoulder).   Lab work was performed and the acetylcholine binding antibodies were elevated at 1.85 (less than 0.24 normal). 57% were blocking antibodies and 25% or modulating antibodies.   Other lab work was noncontributory.     CT scan of the head showed age-related mild chronic microvascular ischemic change.      REVIEW OF SYSTEMS: Constitutional: No fevers, chills, sweats, or change in appetite Eyes: No visual changes, double vision, eye pain.   He notes some ptosis which reduces his visual fields superiorly Ear, nose and throat: No hearing loss, ear pain, nasal congestion, sore throat Cardiovascular: No chest pain, palpitations Respiratory: No shortness of breath at rest or with exertion.   No wheezes.  He has OSA and is having trouble using CPAP. GastrointestinaI: No nausea, vomiting, diarrhea, abdominal pain, fecal incontinence Genitourinary: No dysuria, urinary retention or frequency.  No nocturia. Musculoskeletal: No neck pain, back pain Integumentary: No rashes. Neurological: as above Psychiatric: No depression at this time.  No anxiety Endocrine: No palpitations, diaphoresis, change in appetite, change in weigh or increased  thirst Hematologic/Lymphatic: No anemia, purpura, petechiae. Allergic/Immunologic: No itchy/runny eyes, nasal congestion, recent allergic reactions, rashes  ALLERGIES: Allergies  Allergen Reactions  . Gris-Peg [Griseofulvin] Other (See Comments)    headaches  . Terbinafine Other (See Comments)    headache    HOME MEDICATIONS:  Current Outpatient Medications:  .  aspirin EC 81 MG tablet, Take 81 mg by mouth daily. , Disp: , Rfl:  .  loratadine (CLARITIN) 10 MG tablet, Take 10 mg by mouth daily., Disp: , Rfl:  .  mycophenolate (CELLCEPT) 250 MG capsule, TAKE 2 CAPSULES BY MOUTH TWICE DAILY, Disp: 120 capsule, Rfl: 5 .  pyridostigmine (MESTINON) 60 MG tablet, Take up to 5 pills, Disp: 150 tablet, Rfl: 11 .  traZODone (DESYREL) 50 MG tablet, TAKE 1/2 TO 1 TABLET BY MOUTH EVERY NIGHT AT  BEDTIME, Disp: 30 tablet, Rfl: 5 .  nitroGLYCERIN (NITROSTAT) 0.4 MG SL tablet, Place 1 tablet (0.4 mg total) under the tongue every 5 (five) minutes as needed for chest pain. Reported on 11/01/2015 (Patient not taking: Reported on 11/17/2019), Disp: 25 tablet, Rfl: 1  PAST MEDICAL HISTORY: Past Medical History:  Diagnosis Date  . CAD (coronary artery disease)    previous stent to LAD 2003  . Diabetes mellitus without complication ([place])    No longer taking diabetic medications at this time  . History of complete heart block   . History of kidney stones   . Hyperlipidemia   . Hypertension   . LBBB (left bundle branch block)   . Melanoma ([place])   . Myasthenia gravis ([facility])    eyelids, finger, and toes  . NSVT (nonsustained ventricular tachycardia) ([place])   . OSA (obstructive sleep apnea)    Does not wear CPAP.  Marland Kitchen Presence of permanent cardiac pacemaker   . S/P cardiac pacemaker procedure, placement 10/24/14 Medtronic Adapta L model ADDRL 1  10/25/2014  . Spinal stenosis    Severe    PAST SURGICAL HISTORY: Past Surgical History:  Procedure Laterality Date  . COLONOSCOPY    . CORONARY ANGIOPLASTY  WITH STENT PLACEMENT  03/01/2002   stenting to LAD - Dr. Glade Lloyd  . CYSTOSCOPY/URETEROSCOPY/HOLMIUM LASER/STENT PLACEMENT Left 08/31/2019   Procedure: CYSTOSCOPY/RETROGRADE/STENT PLACEMENT;  Surgeon: Ceasar Mons, MD;  Location[facility];  Service: Urology;  Laterality: Left;  ONY NEEDS 45 MIN  . NM MYOCAR PERF WALL MOTION  11/27/2010   normal  . PERMANENT PACEMAKER INSERTION N/A 10/24/2014   Procedure: PERMANENT PACEMAKER INSERTION;  Surgeon: [name], MD;  Location: [facility] CATH LAB;  Service: Cardiovascular;  Laterality: N/A;  . TONSILLECTOMY    . TRANSURETHRAL RESECTION OF PROSTATE N/A 07/06/2019   Procedure: TRANSURETHRAL RESECTION OF THE PROSTATE (TURP), BIPOLAR;  Surgeon: Ceasar Mons, MD;  Location[facility];  Service: Urology;  Laterality: N/A;  . US ECHOCARDIOGRAPHY  12/05/2010   proximal septal thickening,mild concentric LVH,mild deptal hypokinesis,RV mildly dilated,LA mildly dilated,mild AI,moderate aortic root dilatation,septal motion c/w conduction abnormality    FAMILY HISTORY: Family History  Problem Relation Age of Onset  . Breast cancer Mother   . Colon cancer Father   . Lung cancer Father     SOCIAL HISTORY:  Social History   Socioeconomic History  . Marital status: Married    Spouse name: Not on file  . Number of children: Not on file  . Years of education: Not on file  . Highest education level: Not on file  Occupational History  . Not on file  Tobacco Use  . Smoking status: Former Smoker    Types: Cigarettes, Cigars    Start date: 04/28/1949    Quit date: 10/27/1982    Years since quitting: 37.0  . Smokeless tobacco: Never Used  Substance and Sexual Activity  . Alcohol use: Not Currently    Comment: 1-2 drink per week  . Drug use: No  . Sexual activity: Not on file  Other Topics Concern  . Not on file  Social History Narrative  . Not on file   Social Determinants of Health   Financial  Resource Strain:   . Difficulty of Paying Living Expenses:   Food Insecurity:   . Worried About Charity fundraiser in the Last Year:   . Arboriculturist in the Last Year:   Transportation Needs:   .  Lack of Transportation (Medical):   Marland Kitchen Lack of Transportation (Non-Medical):   Physical Activity:   . Days of Exercise per Week:   . Minutes of Exercise per Session:   Stress:   . Feeling of Stress :   Social Connections:   . Frequency of Communication with Friends and Family:   . Frequency of Social Gatherings with Friends and Family:   . Attends Religious Services:   . Active Member of Clubs or Organizations:   . Attends Archivist Meetings:   Marland Kitchen Marital Status:   Intimate Partner Violence:   . Fear of Current or Ex-Partner:   . Emotionally Abused:   Marland Kitchen Physically Abused:   . Sexually Abused:      PHYSICAL EXAM  Vitals:   11/17/19 1324  BP: (!) 156/81  Pulse: 75  Temp: (!) 96.9 F (36.1 C)  Weight: 176 lb 8 oz (80.1 kg)  Height: 5\' 9"  (1.753 m)    Body mass index is 26.06 kg/m.     General: The patient is well-developed and well-nourished and in no acute distress   Neurologic Exam  Mental status: The patient is alert and oriented x 3 at the time of the examination. The patient has apparent normal recent and remote memory, with an apparently normal attention span and concentration ability.   Speech is normal.  Cranial nerves: Extraocular muscles were intact even with prolonged upgaze.  He has mild ptosis that worsened with prolonged upgaze.Marland Kitchen   He has slightly reduced neck extension strength.  The voice is strong.. Trapezius is strong.  The tongue is midline, and the patient has symmetric elevation of the soft palate. No obvious hearing deficits are noted.  Motor:  Muscle bulk is normal.   Tone is normal. Strength is  5 / 5 in the arms shoulders and neck.  Strength is 4-/5 in the L5 innervated foot and ankle muscles on the left and 5/5 elsewhere in the left leg.   EHL is 45 right and 4+/5 left and he is 5/5 elsewhere on the right.  He can stand from a seated position 2 times without using his arms and easily using his hands.  Sensory: He has reduced sensation in the left foot over the L5 dermatome.  Coordination: Cerebellar testing reveals good finger-nose-finger and mildly reduced left heel-to-shin .  Gait and station: Station is normal.  Stride is reduced and gait is arthritic.  He tuens in 4 steps.  He cannot do a tandem walk. Romberg is negative.   Reflexes: Deep tendon reflexes are symmetric and normal in the arms and knees but absent at the ankles    DIAGNOSTIC DATA (LABS, IMAGING, TESTING) - I reviewed patient records, labs, notes, testing and imaging myself where available.  Lab Results  Component Value Date   WBC 7.0 08/23/2019   HGB 13.9 08/31/2019   HCT 41.0 08/31/2019   MCV 79 08/23/2019   PLT 250 08/23/2019      Component Value Date/Time   NA 139 08/31/2019 0828   NA 141 02/16/2019 1312   K 3.5 08/31/2019 0828   CL 101 08/31/2019 0828   CO2 26 07/07/2019 0506   GLUCOSE 114 (H) 08/31/2019 0828   BUN 12 08/31/2019 0828   BUN 19 02/16/2019 1312   CREATININE 0.70 08/31/2019 0828   CALCIUM 8.3 (L) 07/07/2019 0506   PROT 6.5 08/23/2019 1228   ALBUMIN 4.4 08/23/2019 1228   AST 18 08/23/2019 1228   ALT 10 08/23/2019 1228  ALKPHOS 80 08/23/2019 1228   BILITOT 0.8 08/23/2019 1228   GFRNONAA >60 07/07/2019 0506   GFRAA >60 07/07/2019 0506   No results found for: CHOL, HDL, LDLCALC, LDLDIRECT, TRIG, CHOLHDL Lab Results  Component Value Date   HGBA1C 6.6 (H) 10/24/2014   No results found for: [id number] Lab Results  Component Value Date   TSH 1.402 10/24/2014       ASSESSMENT AND PLAN    1. Myasthenia gravis ([place])   2. High risk medication use   3. Gait abnormality   4. Ptosis of both eyelids   5. OSA (obstructive sleep apnea)   6. Urinary frequency   7. Numbness     1.. Continue CellCept 500 mg twice a  day and pyridostigmine 60 mg 5 to 8 pills a day for the myasthenia gravis.  We will check the CBC and LFT.  If symptoms worsen consider IVIG. 2.   Avoid oxybutynin as may affect MG / strength.   Trial of desmopressin.   If not better try Myrbetriq 3.   He is advised to try to use CPAP nightly.   4.    He will return to see me in 5-6 months but call sooner if he has new or worsening neurologic symptoms.  40-minute office visit with the majority of the time spent face-to-face for history and physical, discussion/counseling and decision-making.  Additional time with record review and documentation.  Delmon Andrada A. Felecia Shelling, MD, PhD [id number], 0000000 PM Certified in Neurology, Clinical Neurophysiology, Sleep Medicine, Pain Medicine and Neuroimaging  [facility] [address] [place], [place] [address] [phone]

## 2019-11-18 DIAGNOSIS — I10 Essential (primary) hypertension: Secondary | ICD-10-CM | POA: Diagnosis not present

## 2019-11-18 DIAGNOSIS — E7800 Pure hypercholesterolemia, unspecified: Secondary | ICD-10-CM | POA: Diagnosis not present

## 2019-11-18 DIAGNOSIS — E119 Type 2 diabetes mellitus without complications: Secondary | ICD-10-CM | POA: Diagnosis not present

## 2019-11-18 DIAGNOSIS — R7309 Other abnormal glucose: Secondary | ICD-10-CM | POA: Diagnosis not present

## 2019-11-21 LAB — COMPREHENSIVE METABOLIC PANEL
ALT: 10 IU/L (ref 0–44)
AST: 17 IU/L (ref 0–40)
Albumin/Globulin Ratio: 1.8 (ref 1.2–2.2)
Albumin: 4.2 g/dL (ref 3.6–4.6)
Alkaline Phosphatase: 78 IU/L (ref 39–117)
BUN/Creatinine Ratio: 20 (ref 10–24)
BUN: 13 mg/dL (ref 8–27)
Bilirubin Total: 0.8 mg/dL (ref 0.0–1.2)
CO2: 27 mmol/L (ref 20–29)
Calcium: 9.1 mg/dL (ref 8.6–10.2)
Chloride: 100 mmol/L (ref 96–106)
Creatinine, Ser: 0.65 mg/dL — ABNORMAL LOW (ref 0.76–1.27)
GFR calc Af Amer: 105 mL/min/{1.73_m2} (ref >59)
GFR calc non Af Amer: 91 mL/min/{1.73_m2} (ref >59)
Globulin, Total: 2.3 g/dL (ref 1.5–4.5)
Glucose: 82 mg/dL (ref 65–99)
Potassium: 4.1 mmol/L (ref 3.5–5.2)
Sodium: 140 mmol/L (ref 134–144)
Total Protein: 6.5 g/dL (ref 6.0–8.5)

## 2019-11-21 LAB — MULTIPLE MYELOMA PANEL, SERUM
Albumin SerPl Elph-Mcnc: 3.6 g/dL (ref 2.9–4.4)
Albumin/Glob SerPl: 1.3 (ref 0.7–1.7)
Alpha 1: 0.2 g/dL (ref 0.0–0.4)
Alpha2 Glob SerPl Elph-Mcnc: 0.7 g/dL (ref 0.4–1.0)
B-Globulin SerPl Elph-Mcnc: 1.1 g/dL (ref 0.7–1.3)
Gamma Glob SerPl Elph-Mcnc: 0.8 g/dL (ref 0.4–1.8)
Globulin, Total: 2.9 g/dL (ref 2.2–3.9)
IgA/Immunoglobulin A, Serum: 260 mg/dL (ref 61–437)
IgG (Immunoglobin G), Serum: 785 mg/dL (ref 603–1613)
IgM (Immunoglobulin M), Srm: 55 mg/dL (ref 15–143)

## 2019-11-21 LAB — CBC WITH DIFFERENTIAL/PLATELET
Basophils Absolute: 0 10*3/uL (ref 0.0–0.2)
Basos: 0 %
EOS (ABSOLUTE): 0 10*3/uL (ref 0.0–0.4)
Eos: 1 %
Hematocrit: 41.6 % (ref 37.5–51.0)
Hemoglobin: 13.1 g/dL (ref 13.0–17.7)
Immature Grans (Abs): 0 10*3/uL (ref 0.0–0.1)
Immature Granulocytes: 0 %
Lymphocytes Absolute: 2 10*3/uL (ref 0.7–3.1)
Lymphs: 33 %
MCH: 25.5 pg — ABNORMAL LOW (ref 26.6–33.0)
MCHC: 31.5 g/dL (ref 31.5–35.7)
MCV: 81 fL (ref 79–97)
Monocytes Absolute: 0.7 10*3/uL (ref 0.1–0.9)
Monocytes: 12 %
Neutrophils Absolute: 3.3 10*3/uL (ref 1.4–7.0)
Neutrophils: 54 %
Platelets: 289 10*3/uL (ref 150–450)
RBC: 5.13 x10E6/uL (ref 4.14–5.80)
RDW: 15.1 % (ref 11.6–15.4)
WBC: 6.1 10*3/uL (ref 3.4–10.8)

## 2019-11-21 LAB — VITAMIN B12: Vitamin B-12: 324 pg/mL (ref 232–1245)

## 2019-11-23 DIAGNOSIS — Z0001 Encounter for general adult medical examination with abnormal findings: Secondary | ICD-10-CM | POA: Diagnosis not present

## 2019-11-23 DIAGNOSIS — G7000 Myasthenia gravis without (acute) exacerbation: Secondary | ICD-10-CM | POA: Diagnosis not present

## 2019-11-23 DIAGNOSIS — I2510 Atherosclerotic heart disease of native coronary artery without angina pectoris: Secondary | ICD-10-CM | POA: Diagnosis not present

## 2019-11-23 DIAGNOSIS — E118 Type 2 diabetes mellitus with unspecified complications: Secondary | ICD-10-CM | POA: Diagnosis not present

## 2019-11-24 DIAGNOSIS — H16213 Exposure keratoconjunctivitis, bilateral: Secondary | ICD-10-CM | POA: Diagnosis not present

## 2019-11-24 DIAGNOSIS — H02132 Senile ectropion of right lower eyelid: Secondary | ICD-10-CM | POA: Diagnosis not present

## 2019-11-24 DIAGNOSIS — H02135 Senile ectropion of left lower eyelid: Secondary | ICD-10-CM | POA: Diagnosis not present

## 2019-11-24 DIAGNOSIS — H04123 Dry eye syndrome of bilateral lacrimal glands: Secondary | ICD-10-CM | POA: Diagnosis not present

## 2019-11-29 ENCOUNTER — Telehealth: Payer: Self-pay | Admitting: Neurology

## 2019-11-29 NOTE — Telephone Encounter (Signed)
Called patient back. He wanted to clarify dose for mestinon. Advised per Dr. Garth Bigness last OV note: pyridostigmine 60 mg 5 to 8 pills a day for the myasthenia gravis. He verbalized understanding.

## 2019-11-29 NOTE — Telephone Encounter (Signed)
Pt is needing to speak to RN. Did not give any other information. After questioning pt finally stated that he is needing to discuss his medication's with RN. Please advise.

## 2019-12-07 DIAGNOSIS — R198 Other specified symptoms and signs involving the digestive system and abdomen: Secondary | ICD-10-CM | POA: Diagnosis not present

## 2019-12-07 DIAGNOSIS — D225 Melanocytic nevi of trunk: Secondary | ICD-10-CM | POA: Diagnosis not present

## 2019-12-07 DIAGNOSIS — L821 Other seborrheic keratosis: Secondary | ICD-10-CM | POA: Diagnosis not present

## 2019-12-07 DIAGNOSIS — Z85828 Personal history of other malignant neoplasm of skin: Secondary | ICD-10-CM | POA: Diagnosis not present

## 2019-12-07 DIAGNOSIS — L820 Inflamed seborrheic keratosis: Secondary | ICD-10-CM | POA: Diagnosis not present

## 2019-12-10 DIAGNOSIS — R198 Other specified symptoms and signs involving the digestive system and abdomen: Secondary | ICD-10-CM | POA: Diagnosis not present

## 2019-12-14 DIAGNOSIS — R198 Other specified symptoms and signs involving the digestive system and abdomen: Secondary | ICD-10-CM | POA: Diagnosis not present

## 2019-12-15 DIAGNOSIS — H5213 Myopia, bilateral: Secondary | ICD-10-CM | POA: Diagnosis not present

## 2019-12-16 DIAGNOSIS — R8279 Other abnormal findings on microbiological examination of urine: Secondary | ICD-10-CM | POA: Diagnosis not present

## 2019-12-16 DIAGNOSIS — N3941 Urge incontinence: Secondary | ICD-10-CM | POA: Diagnosis not present

## 2019-12-16 DIAGNOSIS — N401 Enlarged prostate with lower urinary tract symptoms: Secondary | ICD-10-CM | POA: Diagnosis not present

## 2019-12-28 ENCOUNTER — Telehealth: Payer: Self-pay | Admitting: Neurology

## 2019-12-28 ENCOUNTER — Other Ambulatory Visit: Payer: Self-pay | Admitting: *Deleted

## 2019-12-28 MED ORDER — PYRIDOSTIGMINE BROMIDE 60 MG PO TABS: ORAL_TABLET | ORAL | 3 refills | Status: DC

## 2019-12-28 NOTE — Telephone Encounter (Signed)
Called and LVM letting pt know I sent in updated rx to [facility] on file. Asked him to call back if he has any trouble picking it up from the pharmacy.

## 2019-12-28 NOTE — Telephone Encounter (Signed)
Pt is asking for a call from [place], [place].  Pt states he should have received 720 of pyridostigmine (MESTINON) 60 MG tablet for 3 months because he takes 8 pills a day.  Pt does have enough for the amount he takes daily.  Please call

## 2019-12-30 DIAGNOSIS — M6281 Muscle weakness (generalized): Secondary | ICD-10-CM | POA: Diagnosis not present

## 2019-12-30 DIAGNOSIS — M6289 Other specified disorders of muscle: Secondary | ICD-10-CM | POA: Diagnosis not present

## 2019-12-30 DIAGNOSIS — R3915 Urgency of urination: Secondary | ICD-10-CM | POA: Diagnosis not present

## 2019-12-30 DIAGNOSIS — N3941 Urge incontinence: Secondary | ICD-10-CM | POA: Diagnosis not present

## 2020-01-02 DIAGNOSIS — I2510 Atherosclerotic heart disease of native coronary artery without angina pectoris: Secondary | ICD-10-CM | POA: Diagnosis not present

## 2020-01-03 ENCOUNTER — Encounter: Payer: Medicare Other | Admitting: Physician Assistant

## 2020-01-03 NOTE — Progress Notes (Signed)
Cardiology Office Note Date:  01/03/2020  Patient ID:  David [name]., DOB 1935/10/19, MRN [id number] PCP:  [name], MD  Cardiologist:  Dr. Claiborne Billings Electrophysiologist: Dr. Rayann Heman   Chief Complaint:  annual device visit  History of Present Illness: David [name]. is a 84 y.o. male with history of CHB w/PPM, HTN, CAD (remote PCI 2003), LBBB, occular myasthenia gravis following with neurology, OSA does not use CPAP   He comes in today to be seen for Dr. Rayann Heman, last seen by him via tele health visit June 2020.  He had some SOB with stairs that was described as stable, pt had concerns of unsteady gait. No changes were made, planned for an annual APP visit.  All in all he thinks he is doing OK, his wife accompanies him today, his myasthenia continues to progress mentions has become more diffuse, has an unsteady gait and fatigues easily.  He continues to work in [employer] under[employer], but at his desk, quite sedentary. He feels like his heart is "in good shape". No CP or palpitations, no cardiac awareness.  NO near syncope or syncope. He will get a little winded with exertion, this seems to be his baseline.  He tells me last time he saw Dr. Claiborne Billings, he tyold him he was doing well to continue f/u with his PMD and only needed to see him PRN.  Device information: MDT dual chamber PPM, implanted 10/24/14, Dr. Rayann Heman  Past Medical History:  Diagnosis Date  . CAD (coronary artery disease)    previous stent to LAD 2003  . Diabetes mellitus without complication ([place])    No longer taking diabetic medications at this time  . History of complete heart block   . History of kidney stones   . Hyperlipidemia   . Hypertension   . LBBB (left bundle branch block)   . Melanoma ([place])   . Myasthenia gravis ([facility])    eyelids, finger, and toes  . NSVT (nonsustained ventricular tachycardia) ([place])   . OSA (obstructive sleep apnea)    Does not wear CPAP.  Marland Kitchen Presence of permanent cardiac pacemaker   .  S/P cardiac pacemaker procedure, placement 10/24/14 Medtronic Adapta L model ADDRL 1  10/25/2014  . Spinal stenosis    Severe    Past Surgical History:  Procedure Laterality Date  . COLONOSCOPY    . CORONARY ANGIOPLASTY WITH STENT PLACEMENT  03/01/2002   stenting to LAD - Dr. Glade Lloyd  . CYSTOSCOPY/URETEROSCOPY/HOLMIUM LASER/STENT PLACEMENT Left 08/31/2019   Procedure: CYSTOSCOPY/RETROGRADE/STENT PLACEMENT;  Surgeon: Ceasar Mons, MD;  Location: [facility];  Service: Urology;  Laterality: Left;  ONY NEEDS 45 MIN  . NM MYOCAR PERF WALL MOTION  11/27/2010   normal  . PERMANENT PACEMAKER INSERTION N/A 10/24/2014   Procedure: PERMANENT PACEMAKER INSERTION;  Surgeon: [name], MD;  Location: [facility] CATH LAB;  Service: Cardiovascular;  Laterality: N/A;  . TONSILLECTOMY    . TRANSURETHRAL RESECTION OF PROSTATE N/A 07/06/2019   Procedure: TRANSURETHRAL RESECTION OF THE PROSTATE (TURP), BIPOLAR;  Surgeon: Ceasar Mons, MD;  Location: [facility];  Service: Urology;  Laterality: N/A;  . US ECHOCARDIOGRAPHY  12/05/2010   proximal septal thickening,mild concentric LVH,mild deptal hypokinesis,RV mildly dilated,LA mildly dilated,mild AI,moderate aortic root dilatation,septal motion c/w conduction abnormality    Current Outpatient Medications  Medication Sig Dispense Refill  . aspirin EC 81 MG tablet Take 81 mg by mouth daily.     Marland Kitchen desmopressin (DDAVP) 0.1 MG tablet Take  1 tablet (0.1 mg total) by mouth at bedtime. 30 tablet 5  . loratadine (CLARITIN) 10 MG tablet Take 10 mg by mouth daily.    . mycophenolate (CELLCEPT) 250 MG capsule TAKE 2 CAPSULES BY MOUTH TWICE DAILY 120 capsule 5  . nitroGLYCERIN (NITROSTAT) 0.4 MG SL tablet Place 1 tablet (0.4 mg total) under the tongue every 5 (five) minutes as needed for chest pain. Reported on 11/01/2015 (Patient not taking: Reported on 11/17/2019) 25 tablet 1  . pyridostigmine (MESTINON) 60 MG tablet Take up to 5-8  pills per day 720 tablet 3  . traZODone (DESYREL) 50 MG tablet TAKE 1/2 TO 1 TABLET BY MOUTH EVERY NIGHT AT BEDTIME 30 tablet 5   No current facility-administered medications for this visit.    Allergies:   Gris-peg [griseofulvin] and Terbinafine   Social History:  The patient  reports that he quit smoking about 37 years ago. His smoking use included cigarettes and cigars. He started smoking about 70 years ago. He has never used smokeless tobacco. He reports previous alcohol use. He reports that he does not use drugs.   Family History:  The patient's family history includes Breast cancer in his mother; Colon cancer in his father; Lung cancer in his father.  ROS:  Please see the history of present illness.  All other systems are reviewed and otherwise negative.   PHYSICAL EXAM:  VS:  There were no vitals taken for this visit. BMI: There is no height or weight on file to calculate BMI. Well nourished, well developed, in no acute distress  HEENT: normocephalic, atraumatic  Neck: no JVD, carotid bruits or masses Cardiac:  RRR; no significant murmurs, no rubs, or gallops Lungs: CTA b/l, no wheezing, rhonchi or rales  Abd: soft, nontender, obese MS: no deformity, advanced atrophy Ext: no edema  Skin: warm and dry, no rash Neuro:  No gross deficits appreciated Psych: euthymic mood, full affect  PPM site is stable, no tethering or discomfort   EKG: not done today Personally reviewed 07/06/2019: SR/VP, 80bpm   PPM interrogation done today and reviewed by myself:  Battery and lead measurements are good VP 100% Has underlying conduction today AHR and VHR episodes noted Available EGM/travings reviewed  All are 1:1 except 2 Longest AT was 5 minutes occurred >1 year ago markers only, though look NSVT, 7 beats and 6 beats    10/24/14: TTE Study Conclusions - Left ventricle: The cavity size was normal. Wall thickness was increased in a pattern of moderate LVH. Systolic function  was normal. The estimated ejection fraction was in the range of 55% to 60%. Although no diagnostic regional wall motion abnormality was identified, this possibility cannot be completely excluded on the basis of this study. Doppler parameters are consistent with abnormal left ventricular relaxation (grade 1 diastolic dysfunction). The E/e&' ratio is between 8-15, suggesting indeterminate LV filling pressure. - Aortic valve: Trileaflet. Transvalvular velocity was within the normal range. There was no stenosis. There was mild regurgitation. - Aorta: Aortic root dimension: 43 mm (ED). - Ascending aorta: The ascending aorta was mildly dilated. - Mitral valve: There was trivial regurgitation. - Left atrium: The atrium was normal in size. - Right atrium: The atrium was at the upper limits of normal in size. - Atrial septum: No defect or patent foramen ovale was identified. - Tricuspid valve: There was moderate regurgitation. - Pulmonary arteries: PA peak pressure: 43 mm Hg (S). - Inferior vena cava: The vessel was normal in size. The respirophasic diameter  changes were in the normal range (>= 50%), consistent with normal central venous pressure. Impressions - LVEF 55-60%, moderate LVH, diastolic dysfunction, mild AI, dilated aortic root up to 4.3 cm, moderate TR, RVSP 43 mmHg.  04/06/15: lexiscan stress myoview Impression Myocardial perfusion is abnormal. This is a low risk study. Overall left ventricular systolic function was abnormal. LV cavity size is mildly enlarged. Nuclear stress EF: 43%. The left ventricular ejection fraction is moderately decreased (30-44%). A prior study was conducted on 11/27/2010.There are no significant changes in comparison to the prior study.       Recent Labs: 11/17/2019: ALT 10; BUN 13; Creatinine, Ser 0.65; Hemoglobin 13.1; Platelets 289; Potassium 4.1; Sodium 140  No results found for requested labs within last 8760 hours.   CrCl  cannot be calculated (Patient's most recent lab result is older than the maximum 21 days allowed.).   Wt Readings from Last 3 Encounters:  11/17/19 176 lb 8 oz (80.1 kg)  08/31/19 189 lb 6.4 oz (85.9 kg)  08/09/19 198 lb 6.4 oz (90 kg)     Other studies reviewed: Additional studies/records reviewed today include: summarized above  ASSESSMENT AND PLAN:  1. CHB w/PPM     Intact device function     No programming changes were made  2. CAD     No anginal complaints     Discussed getting established with Dr. Claiborne Billings, he does not think is necessary     PMD monitors and manages his lipids     On ASA, vytorin  3. HTN     Looks good, no changes     Disposition: continue remotes Q 75mo, and in clinic with EP in 1 year, sooner if needed   Current medicines are reviewed at length with the patient today.  The patient did not have any concerns regarding medicines.  Haywood Lasso, PA-C 01/03/2020 5:54 AM     [address] [place] [address] [phone] (office)  [phone] (fax)

## 2020-01-04 ENCOUNTER — Other Ambulatory Visit: Payer: Self-pay

## 2020-01-04 ENCOUNTER — Ambulatory Visit: Payer: Medicare Other | Admitting: Physician Assistant

## 2020-01-04 VITALS — BP 126/76 | HR 72 | Ht 69.0 in | Wt 170.0 lb

## 2020-01-04 DIAGNOSIS — I10 Essential (primary) hypertension: Secondary | ICD-10-CM | POA: Diagnosis not present

## 2020-01-04 DIAGNOSIS — I442 Atrioventricular block, complete: Secondary | ICD-10-CM

## 2020-01-04 DIAGNOSIS — Z950 Presence of cardiac pacemaker: Secondary | ICD-10-CM | POA: Diagnosis not present

## 2020-01-04 DIAGNOSIS — I2510 Atherosclerotic heart disease of native coronary artery without angina pectoris: Secondary | ICD-10-CM

## 2020-01-04 DIAGNOSIS — E118 Type 2 diabetes mellitus with unspecified complications: Secondary | ICD-10-CM | POA: Diagnosis not present

## 2020-01-04 DIAGNOSIS — E7800 Pure hypercholesterolemia, unspecified: Secondary | ICD-10-CM | POA: Diagnosis not present

## 2020-01-04 MED ORDER — TAMSULOSIN HCL 0.4 MG PO CAPS: 0.4000 mg | ORAL_CAPSULE | ORAL | 6 refills | Status: DC | PRN

## 2020-01-04 NOTE — Patient Instructions (Signed)
Medication Instructions:   Your physician recommends that you continue on your current medications as directed. Please refer to the Current Medication list given to you today.  *If you need a refill on your cardiac medications before your next appointment, please call your pharmacy*   Lab Work: NONE ORDERED  TODAY   If you have labs (blood work) drawn today and your tests are completely normal, you will receive your results only by: . MyChart Message (if you have MyChart) OR . A paper copy in the mail If you have any lab test that is abnormal or we need to change your treatment, we will call you to review the results.   Testing/Procedures: NONE ORDERED  TODAY   Follow-Up: At [facility], you and your health needs are our priority.  As part of our continuing mission to provide you with exceptional heart care, we have created designated Provider Care Teams.  These Care Teams include your primary Cardiologist (physician) and Advanced Practice Providers (APPs -  Physician Assistants and Nurse Practitioners) who all work together to provide you with the care you need, when you need it.  We recommend signing up for the patient portal called "MyChart".  Sign up information is provided on this After Visit Summary.  MyChart is used to connect with patients for Virtual Visits (Telemedicine).  Patients are able to view lab/test results, encounter notes, upcoming appointments, etc.  Non-urgent messages can be sent to your provider as well.   To learn more about what you can do with MyChart, go to [email or web address].    Your next appointment:   1 year(s)  The format for your next appointment:   In Person  Provider:   You may see James Allred, MD or one of the following Advanced Practice Providers on your designated Care Team:    Amber Seiler, NP  Renee Ursuy, PA-C  Michael "Andy" Tillery, PA-C    Other Instructions   

## 2020-01-10 DIAGNOSIS — H02132 Senile ectropion of right lower eyelid: Secondary | ICD-10-CM | POA: Diagnosis not present

## 2020-01-10 DIAGNOSIS — H02135 Senile ectropion of left lower eyelid: Secondary | ICD-10-CM | POA: Diagnosis not present

## 2020-01-10 DIAGNOSIS — N3946 Mixed incontinence: Secondary | ICD-10-CM | POA: Diagnosis not present

## 2020-01-10 DIAGNOSIS — M6289 Other specified disorders of muscle: Secondary | ICD-10-CM | POA: Diagnosis not present

## 2020-01-10 DIAGNOSIS — M6281 Muscle weakness (generalized): Secondary | ICD-10-CM | POA: Diagnosis not present

## 2020-01-10 DIAGNOSIS — H02115 Cicatricial ectropion of left lower eyelid: Secondary | ICD-10-CM | POA: Diagnosis not present

## 2020-01-10 DIAGNOSIS — H02112 Cicatricial ectropion of right lower eyelid: Secondary | ICD-10-CM | POA: Diagnosis not present

## 2020-01-11 DIAGNOSIS — L309 Dermatitis, unspecified: Secondary | ICD-10-CM | POA: Diagnosis not present

## 2020-01-11 DIAGNOSIS — L814 Other melanin hyperpigmentation: Secondary | ICD-10-CM | POA: Diagnosis not present

## 2020-01-11 DIAGNOSIS — Z85828 Personal history of other malignant neoplasm of skin: Secondary | ICD-10-CM | POA: Diagnosis not present

## 2020-01-11 DIAGNOSIS — L304 Erythema intertrigo: Secondary | ICD-10-CM | POA: Diagnosis not present

## 2020-01-17 ENCOUNTER — Telehealth: Payer: Self-pay | Admitting: *Deleted

## 2020-01-17 NOTE — Telephone Encounter (Signed)
   [facility] Pre-operative Risk Assessment    HEARTCARE STAFF: - Please ensure there is not already an duplicate clearance open for this procedure. - Under Visit Info/Reason for Call, type in Other and utilize the format Clearance MM/DD/YY or Clearance TBD. Do not use dashes or single digits. - If request is for dental extraction, please clarify the # of teeth to be extracted.  Request for surgical clearance:  1. What type of surgery is being performed? STARTING WITH THE RIGHT SIDE FIRST:RIGHT LOWER EYELID ECTROPION REPAIR FOLLOWED BY THE LEFT LOWER EYELID ECTROPION REPAIR  2. When is this surgery scheduled? TBD   3. What type of clearance is required (medical clearance vs. Pharmacy clearance to hold med vs. Both)? MEDICAL  4. Are there any medications that need to be held prior to surgery and how long? ASA    5. Practice name and name of physician performing surgery? [facility]; [place], [place]   6. What is the office phone number? [phone]   7.   What is the office fax number? [phone]  8.   Anesthesia type (None, local, MAC, general) ? MAC   Julaine Hua 01/17/2020, 4:36 PM  _________________________________________________________________   (provider comments below)

## 2020-01-18 NOTE — Telephone Encounter (Signed)
   Primary Cardiologist: Dr. Kelly/Dr. Rayann Heman Chart reviewed as part of pre-operative protocol coverage. The patient was doing well on cardiac stand point when seen by Kandis Fantasia 01/04/20. Given past medical history and time since last visit, based on ACC/AHA guidelines, Lisandro L Zelma Mazariego. would be at acceptable risk for the planned procedure without further cardiovascular testing. He can hold ASA 7 days prior if needed.   I will route this recommendation to the requesting party via Epic fax function and remove from pre-op pool.  Please call with questions.  [place], [place] 01/18/2020, 8:41 AM

## 2020-01-25 DIAGNOSIS — R143 Flatulence: Secondary | ICD-10-CM | POA: Diagnosis not present

## 2020-01-25 DIAGNOSIS — R198 Other specified symptoms and signs involving the digestive system and abdomen: Secondary | ICD-10-CM | POA: Diagnosis not present

## 2020-01-25 DIAGNOSIS — K580 Irritable bowel syndrome with diarrhea: Secondary | ICD-10-CM | POA: Diagnosis not present

## 2020-02-01 DIAGNOSIS — M6281 Muscle weakness (generalized): Secondary | ICD-10-CM | POA: Diagnosis not present

## 2020-02-01 DIAGNOSIS — R3915 Urgency of urination: Secondary | ICD-10-CM | POA: Diagnosis not present

## 2020-02-01 DIAGNOSIS — M6289 Other specified disorders of muscle: Secondary | ICD-10-CM | POA: Diagnosis not present

## 2020-02-01 DIAGNOSIS — N3941 Urge incontinence: Secondary | ICD-10-CM | POA: Diagnosis not present

## 2020-02-02 DIAGNOSIS — J309 Allergic rhinitis, unspecified: Secondary | ICD-10-CM | POA: Diagnosis not present

## 2020-02-03 DIAGNOSIS — R198 Other specified symptoms and signs involving the digestive system and abdomen: Secondary | ICD-10-CM | POA: Diagnosis not present

## 2020-02-07 DIAGNOSIS — R143 Flatulence: Secondary | ICD-10-CM | POA: Diagnosis not present

## 2020-02-07 DIAGNOSIS — R198 Other specified symptoms and signs involving the digestive system and abdomen: Secondary | ICD-10-CM | POA: Diagnosis not present

## 2020-02-22 ENCOUNTER — Ambulatory Visit: Payer: Medicare Other | Admitting: Neurology

## 2020-02-27 ENCOUNTER — Telehealth: Payer: Self-pay | Admitting: Neurology

## 2020-02-27 DIAGNOSIS — H00024 Hordeolum internum left upper eyelid: Secondary | ICD-10-CM | POA: Diagnosis not present

## 2020-02-27 DIAGNOSIS — H2513 Age-related nuclear cataract, bilateral: Secondary | ICD-10-CM | POA: Diagnosis not present

## 2020-02-27 DIAGNOSIS — Z9889 Other specified postprocedural states: Secondary | ICD-10-CM | POA: Diagnosis not present

## 2020-02-27 NOTE — Telephone Encounter (Signed)
Called, LVM relaying Dr. Garth Bigness message. Advised him to call back if he has any more questions/concerns.

## 2020-02-27 NOTE — Telephone Encounter (Signed)
That's fine

## 2020-02-27 NOTE — Telephone Encounter (Signed)
Pt states Dr Katy Fitch prescribed him Doxycycline HYC 100mg  today.  Pt would like to know what Dr Felecia Shelling thinks about that before he starts, please call

## 2020-02-27 NOTE — Telephone Encounter (Signed)
Dr. Felecia Shelling- are you ok with him starting this?

## 2020-02-28 DIAGNOSIS — G7000 Myasthenia gravis without (acute) exacerbation: Secondary | ICD-10-CM | POA: Diagnosis not present

## 2020-02-28 DIAGNOSIS — D210 Benign neoplasm of connective and other soft tissue of head, face and neck: Secondary | ICD-10-CM | POA: Diagnosis not present

## 2020-02-28 DIAGNOSIS — H0014 Chalazion left upper eyelid: Secondary | ICD-10-CM | POA: Diagnosis not present

## 2020-02-28 DIAGNOSIS — H0011 Chalazion right upper eyelid: Secondary | ICD-10-CM | POA: Diagnosis not present

## 2020-03-05 DIAGNOSIS — H0014 Chalazion left upper eyelid: Secondary | ICD-10-CM | POA: Diagnosis not present

## 2020-03-05 DIAGNOSIS — H0011 Chalazion right upper eyelid: Secondary | ICD-10-CM | POA: Diagnosis not present

## 2020-03-05 DIAGNOSIS — Z09 Encounter for follow-up examination after completed treatment for conditions other than malignant neoplasm: Secondary | ICD-10-CM | POA: Diagnosis not present

## 2020-03-05 DIAGNOSIS — H02132 Senile ectropion of right lower eyelid: Secondary | ICD-10-CM | POA: Diagnosis not present

## 2020-03-06 DIAGNOSIS — H02132 Senile ectropion of right lower eyelid: Secondary | ICD-10-CM | POA: Diagnosis not present

## 2020-03-06 DIAGNOSIS — H02135 Senile ectropion of left lower eyelid: Secondary | ICD-10-CM | POA: Diagnosis not present

## 2020-03-06 DIAGNOSIS — G7000 Myasthenia gravis without (acute) exacerbation: Secondary | ICD-10-CM | POA: Diagnosis not present

## 2020-03-06 DIAGNOSIS — D210 Benign neoplasm of connective and other soft tissue of head, face and neck: Secondary | ICD-10-CM | POA: Diagnosis not present

## 2020-03-06 DIAGNOSIS — H02532 Eyelid retraction right lower eyelid: Secondary | ICD-10-CM | POA: Diagnosis not present

## 2020-03-20 ENCOUNTER — Telehealth: Payer: Self-pay | Admitting: Neurology

## 2020-03-20 NOTE — Telephone Encounter (Signed)
Pt is asking for a call from [place] re: his pyridostigmine (MESTINON) 60 MG tablet  The number of pills and when the medication can be renewed, please call

## 2020-03-20 NOTE — Telephone Encounter (Signed)
Called pt back again. Relayed below info about new rx being sent in June for 90 days supply with 3 refills. Pt states he runs out of medication d/t more days in some months. He is wanting qty 750 or 780 to have extra on hand if need be and in case he has to go on trips. Advised I will have to send to MD for approval and will call him back this afternoon.

## 2020-03-20 NOTE — Telephone Encounter (Signed)
Tried calling pt back, went to VM but unable to leave message. Call ended.   We had sent a new prescription to [facility], [place] on 12/28/19 #720 (3 months supply) with 3 refills. Refills should be on file at the pharmacy for him.

## 2020-03-21 MED ORDER — PYRIDOSTIGMINE BROMIDE 60 MG PO TABS: ORAL_TABLET | ORAL | 3 refills | Status: DC

## 2020-03-21 NOTE — Telephone Encounter (Signed)
We can go ahead and make a prescription for 780 tablets every 90 days (take 1 pill up to 9 times a day) with refills.  9 pills a day is the maximum dose so I do not want him to take more than that dose

## 2020-03-21 NOTE — Addendum Note (Signed)
Addended by: Wyvonnia Lora on: 03/21/2020 02:55 PM   Modules accepted: Orders

## 2020-03-21 NOTE — Telephone Encounter (Addendum)
Called pt back. Relayed Dr. Garth Bigness message. He verbalized understanding and appreciation. He is aware not to take more than 9 pills per day, that this is the max dose. Advised him to call back if he runs into any issues picking up prescription.  I e-scribed new prescription.

## 2020-03-22 DIAGNOSIS — G7000 Myasthenia gravis without (acute) exacerbation: Secondary | ICD-10-CM | POA: Diagnosis not present

## 2020-03-22 DIAGNOSIS — I2510 Atherosclerotic heart disease of native coronary artery without angina pectoris: Secondary | ICD-10-CM | POA: Diagnosis not present

## 2020-03-22 DIAGNOSIS — Z8719 Personal history of other diseases of the digestive system: Secondary | ICD-10-CM | POA: Diagnosis not present

## 2020-03-22 DIAGNOSIS — Z9989 Dependence on other enabling machines and devices: Secondary | ICD-10-CM | POA: Diagnosis not present

## 2020-03-23 DIAGNOSIS — N3941 Urge incontinence: Secondary | ICD-10-CM | POA: Diagnosis not present

## 2020-03-23 DIAGNOSIS — N401 Enlarged prostate with lower urinary tract symptoms: Secondary | ICD-10-CM | POA: Diagnosis not present

## 2020-03-27 ENCOUNTER — Telehealth: Payer: Self-pay | Admitting: Neurology

## 2020-03-27 NOTE — Telephone Encounter (Signed)
Pt ask if  a prescription from another physician for  Myrbetriq 50 mg have reaction to current medication prescribe by Dr. Felecia Shelling.    pyridostigmine (MESTINON) 60 MG tablet came in a different container when I got the prescription. Concerned about the container. Pt would like a call from the nurse.

## 2020-03-27 NOTE — Telephone Encounter (Signed)
Called pt back. He was concerned about the way his pyridostigmine was packaged from the pharmacy. He received two bottles for qty 390 to equal total of #780 prescribed. I recommended he contact pharmacy about packaging concerns. He will do this. He also wants 30 days supply moving forward. I advised he can request this from the pharmacy as well. He verbalized understandings.  I checked and there are no drug interactions between myrbetriq and mestinon.

## 2020-04-09 ENCOUNTER — Other Ambulatory Visit: Payer: Self-pay | Admitting: Neurology

## 2020-04-20 ENCOUNTER — Telehealth: Payer: Self-pay

## 2020-04-20 NOTE — Telephone Encounter (Signed)
NOTES ON FILE FROM [facility] [phone], SENT REFERRAL TO SCHEDULING

## 2020-05-09 DIAGNOSIS — R634 Abnormal weight loss: Secondary | ICD-10-CM | POA: Diagnosis not present

## 2020-05-09 DIAGNOSIS — R0600 Dyspnea, unspecified: Secondary | ICD-10-CM | POA: Diagnosis not present

## 2020-05-14 ENCOUNTER — Other Ambulatory Visit: Payer: Self-pay | Admitting: Ophthalmology

## 2020-05-14 DIAGNOSIS — H02145 Spastic ectropion of left lower eyelid: Secondary | ICD-10-CM | POA: Diagnosis not present

## 2020-05-14 DIAGNOSIS — L570 Actinic keratosis: Secondary | ICD-10-CM | POA: Diagnosis not present

## 2020-05-14 DIAGNOSIS — G7000 Myasthenia gravis without (acute) exacerbation: Secondary | ICD-10-CM | POA: Diagnosis not present

## 2020-05-14 DIAGNOSIS — H02115 Cicatricial ectropion of left lower eyelid: Secondary | ICD-10-CM | POA: Diagnosis not present

## 2020-05-14 DIAGNOSIS — L821 Other seborrheic keratosis: Secondary | ICD-10-CM | POA: Diagnosis not present

## 2020-05-14 DIAGNOSIS — H02535 Eyelid retraction left lower eyelid: Secondary | ICD-10-CM | POA: Diagnosis not present

## 2020-05-14 DIAGNOSIS — H11822 Conjunctivochalasis, left eye: Secondary | ICD-10-CM | POA: Diagnosis not present

## 2020-05-14 DIAGNOSIS — H02135 Senile ectropion of left lower eyelid: Secondary | ICD-10-CM | POA: Diagnosis not present

## 2020-05-14 DIAGNOSIS — H018 Other specified inflammations of eyelid: Secondary | ICD-10-CM | POA: Diagnosis not present

## 2020-05-14 DIAGNOSIS — H0012 Chalazion right lower eyelid: Secondary | ICD-10-CM | POA: Diagnosis not present

## 2020-05-14 DIAGNOSIS — H0289 Other specified disorders of eyelid: Secondary | ICD-10-CM | POA: Diagnosis not present

## 2020-05-14 DIAGNOSIS — H04562 Stenosis of left lacrimal punctum: Secondary | ICD-10-CM | POA: Diagnosis not present

## 2020-05-14 DIAGNOSIS — L820 Inflamed seborrheic keratosis: Secondary | ICD-10-CM | POA: Diagnosis not present

## 2020-05-18 ENCOUNTER — Telehealth: Payer: Self-pay | Admitting: Neurology

## 2020-05-18 ENCOUNTER — Other Ambulatory Visit: Payer: Self-pay | Admitting: Neurology

## 2020-05-18 NOTE — Telephone Encounter (Signed)
David [name] from [facility] called stating that themycophenolate (CELLCEPT) 250 MG capsule has been approved from 05/17/20 and good till 05/17/21

## 2020-05-21 NOTE — Telephone Encounter (Signed)
Received PA request from [facility] for this medication. Faxed notice of approval to them at [phone]. Received fax confirmation.

## 2020-05-22 ENCOUNTER — Other Ambulatory Visit: Payer: Self-pay

## 2020-05-22 ENCOUNTER — Ambulatory Visit: Payer: Medicare Other | Admitting: Neurology

## 2020-05-22 ENCOUNTER — Encounter: Payer: Self-pay | Admitting: Neurology

## 2020-05-22 VITALS — BP 149/77 | HR 76 | Ht 69.0 in | Wt 146.5 lb

## 2020-05-22 DIAGNOSIS — R269 Unspecified abnormalities of gait and mobility: Secondary | ICD-10-CM

## 2020-05-22 DIAGNOSIS — G4733 Obstructive sleep apnea (adult) (pediatric): Secondary | ICD-10-CM | POA: Diagnosis not present

## 2020-05-22 DIAGNOSIS — Z79899 Other long term (current) drug therapy: Secondary | ICD-10-CM | POA: Diagnosis not present

## 2020-05-22 DIAGNOSIS — M48061 Spinal stenosis, lumbar region without neurogenic claudication: Secondary | ICD-10-CM

## 2020-05-22 DIAGNOSIS — R350 Frequency of micturition: Secondary | ICD-10-CM

## 2020-05-22 DIAGNOSIS — G7000 Myasthenia gravis without (acute) exacerbation: Secondary | ICD-10-CM

## 2020-05-22 MED ORDER — PYRIDOSTIGMINE BROMIDE 60 MG PO TABS: ORAL_TABLET | ORAL | 3 refills | Status: DC

## 2020-05-22 NOTE — Progress Notes (Signed)
[facility]  PATIENT: David [name]. DOB: August 26, 1935  REFERRING DOCTOR OR PCP:  [name] (PCP) and Rutherford Guys (Ophtho) SOURCE: Patient, records from Dr. Shelia Media, Lab results, CPAP download  _________________________________   HISTORICAL  CHIEF COMPLAINT:  Chief Complaint  Patient presents with  . Follow-up    RM 13. Last seen 11/17/2019. About 3-4 falls since last seen, no significant injuries. Chokes more on food, started 06/2019.   . Myasthenia Gravis    On cellcept 500mg  BID, mestinon 60mg  (up to 9x/day).   . Gait Problem    Ambulates with cane  . Insomnia    Takes trazodone  . Sleep Apnea    Download shows pt still not using cpap    HISTORY OF PRESENT ILLNESS:  David [name]  is a 84 y.o. man with myasthenia gravis, and OSA.     Update 1026/2021: He just had another blepharoplasty operation for ptosis.   He has mild proximal weakness (was able to stand up x 3 without arms).     He is on Cellcept 500 mg po bid and Mestinon up to 8 pills (60 mg each) daily.     He has nocturia  4-6 times nightly  Desmopressin has helped.   Myrbetriq was just started.   He is also on tamsulosin.     He takes trazodone for insomnia and tolerates it well.  He has nocturia but falls asleep easily after using the toilet.   He has lost 75 pounds in last 1-2 years.  He no longer snores.  He is not using CPAP.     Compliance = 0% for 4 hrs and 40% for any use.  AHI = 3.7   His back and leg pain improved with his weight loss.  .  He has severe spinal stenosis at L3-L4 and also has a pars defect with anterolisthesis at L5-S1.  He has not needed ESIs recently.     MG History:   He had the onset of ptosis, while traveling in [place] and [place], about 3 weeks ago. He first noted the ptosis while trying to watch a movie in the bus (screen was up high) and noting that the eyelids were covering his vision.  He did note the trip was stressful and he and his wife both had  injuries (he sprained ankle and wife broke shoulder).   Lab work was performed and the acetylcholine binding antibodies were elevated at 1.85 (less than 0.24 normal). 57% were blocking antibodies and 25% or modulating antibodies.   Other lab work was noncontributory.     CT scan of the head showed age-related mild chronic microvascular ischemic change.      REVIEW OF SYSTEMS: Constitutional: No fevers, chills, sweats, or change in appetite Eyes: No visual changes, double vision, eye pain.   He notes some ptosis which reduces his visual fields superiorly Ear, nose and throat: No hearing loss, ear pain, nasal congestion, sore throat Cardiovascular: No chest pain, palpitations Respiratory: No shortness of breath at rest or with exertion.   No wheezes.  He has OSA and is having trouble using CPAP. GastrointestinaI: No nausea, vomiting, diarrhea, abdominal pain, fecal incontinence Genitourinary: No dysuria, urinary retention or frequency.  No nocturia. Musculoskeletal: No neck pain, back pain Integumentary: No rashes. Neurological: as above Psychiatric: No depression at this time.  No anxiety Endocrine: No palpitations, diaphoresis, change in appetite, change in weigh or increased thirst Hematologic/Lymphatic: No anemia, purpura, petechiae. Allergic/Immunologic: No itchy/runny eyes, nasal  congestion, recent allergic reactions, rashes  ALLERGIES: Allergies  Allergen Reactions  . Gris-Peg [Griseofulvin] Other (See Comments)    headaches  . Terbinafine Other (See Comments)    headache    HOME MEDICATIONS:  Current Outpatient Medications:  .  aspirin EC 81 MG tablet, Take 81 mg by mouth daily. , Disp: , Rfl:  .  desmopressin (DDAVP) 0.1 MG tablet, TAKE 1 TABLET(0.1 MG) BY MOUTH AT BEDTIME, Disp: 30 tablet, Rfl: 0 .  ezetimibe-simvastatin (VYTORIN) 10-40 MG tablet, Take 1 tablet by mouth daily., Disp: , Rfl:  .  loratadine (CLARITIN) 10 MG tablet, Take 10 mg by mouth daily., Disp: , Rfl:   .  mycophenolate (CELLCEPT) 250 MG capsule, TAKE 2 CAPSULES BY MOUTH TWICE DAILY, Disp: 120 capsule, Rfl: 5 .  nitroGLYCERIN (NITROSTAT) 0.4 MG SL tablet, Place 1 tablet (0.4 mg total) under the tongue every 5 (five) minutes as needed for chest pain. Reported on 11/01/2015, Disp: 25 tablet, Rfl: 1 .  pyridostigmine (MESTINON) 60 MG tablet, Take up to 5-9 pills per day, Disp: 780 tablet, Rfl: 3 .  tamsulosin (FLOMAX) 0.4 MG CAPS capsule, Take 1 capsule (0.4 mg total) by mouth as needed., Disp: 30 capsule, Rfl: 6 .  traZODone (DESYREL) 50 MG tablet, TAKE 1/2 TO 1 TABLET BY MOUTH EVERY NIGHT AT BEDTIME, Disp: 30 tablet, Rfl: 5  PAST MEDICAL HISTORY: Past Medical History:  Diagnosis Date  . CAD (coronary artery disease)    previous stent to LAD 2003  . Diabetes mellitus without complication ([place])    No longer taking diabetic medications at this time  . History of complete heart block   . History of kidney stones   . Hyperlipidemia   . Hypertension   . LBBB (left bundle branch block)   . Melanoma ([place])   . Myasthenia gravis ([facility])    eyelids, finger, and toes  . NSVT (nonsustained ventricular tachycardia) ([place])   . OSA (obstructive sleep apnea)    Does not wear CPAP.  Marland Kitchen Presence of permanent cardiac pacemaker   . S/P cardiac pacemaker procedure, placement 10/24/14 Medtronic Adapta L model ADDRL 1  10/25/2014  . Spinal stenosis    Severe    PAST SURGICAL HISTORY: Past Surgical History:  Procedure Laterality Date  . COLONOSCOPY    . CORONARY ANGIOPLASTY WITH STENT PLACEMENT  03/01/2002   stenting to LAD - Dr. Glade Lloyd  . CYSTOSCOPY/URETEROSCOPY/HOLMIUM LASER/STENT PLACEMENT Left 08/31/2019   Procedure: CYSTOSCOPY/RETROGRADE/STENT PLACEMENT;  Surgeon: Ceasar Mons, MD;  Location: [facility];  Service: Urology;  Laterality: Left;  ONY NEEDS 45 MIN  . NM MYOCAR PERF WALL MOTION  11/27/2010   normal  . PERMANENT PACEMAKER INSERTION N/A 10/24/2014   Procedure:  PERMANENT PACEMAKER INSERTION;  Surgeon: [name], MD;  Location: [facility] CATH LAB;  Service: Cardiovascular;  Laterality: N/A;  . TONSILLECTOMY    . TRANSURETHRAL RESECTION OF PROSTATE N/A 07/06/2019   Procedure: TRANSURETHRAL RESECTION OF THE PROSTATE (TURP), BIPOLAR;  Surgeon: Ceasar Mons, MD;  Location: [facility];  Service: Urology;  Laterality: N/A;  . US ECHOCARDIOGRAPHY  12/05/2010   proximal septal thickening,mild concentric LVH,mild deptal hypokinesis,RV mildly dilated,LA mildly dilated,mild AI,moderate aortic root dilatation,septal motion c/w conduction abnormality    FAMILY HISTORY: Family History  Problem Relation Age of Onset  . Breast cancer Mother   . Colon cancer Father   . Lung cancer Father     SOCIAL HISTORY:  Social History   Socioeconomic History  .  Marital status: Married    Spouse name: Not on file  . Number of children: Not on file  . Years of education: Not on file  . Highest education level: Not on file  Occupational History  . Not on file  Tobacco Use  . Smoking status: Former Smoker    Types: Cigarettes, Cigars    Start date: 04/28/1949    Quit date: 10/27/1982    Years since quitting: 37.5  . Smokeless tobacco: Never Used  Vaping Use  . Vaping Use: Never used  Substance and Sexual Activity  . Alcohol use: Not Currently    Comment: 1-2 drink per week  . Drug use: No  . Sexual activity: Not on file  Other Topics Concern  . Not on file  Social History Narrative  . Not on file   Social Determinants of Health   Financial Resource Strain:   . Difficulty of Paying Living Expenses: Not on file  Food Insecurity:   . Worried About Charity fundraiser in the Last Year: Not on file  . Ran Out of Food in the Last Year: Not on file  Transportation Needs:   . Lack of Transportation (Medical): Not on file  . Lack of Transportation (Non-Medical): Not on file  Physical Activity:   . Days of Exercise per Week: Not on file   . Minutes of Exercise per Session: Not on file  Stress:   . Feeling of Stress : Not on file  Social Connections:   . Frequency of Communication with Friends and Family: Not on file  . Frequency of Social Gatherings with Friends and Family: Not on file  . Attends Religious Services: Not on file  . Active Member of Clubs or Organizations: Not on file  . Attends Archivist Meetings: Not on file  . Marital Status: Not on file  Intimate Partner Violence:   . Fear of Current or Ex-Partner: Not on file  . Emotionally Abused: Not on file  . Physically Abused: Not on file  . Sexually Abused: Not on file     PHYSICAL EXAM  Vitals:   05/22/20 1453  BP: (!) 149/77  Pulse: 76  Weight: 146 lb 8 oz (66.5 kg)  Height: 5\' 9"  (1.753 m)    Body mass index is 21.63 kg/m.     General: The patient is well-developed and well-nourished and in no acute distress   Neurologic Exam  Mental status: The patient is alert and oriented x 3 at the time of the examination. The patient has apparent normal recent and remote memory, with an apparently normal attention span and concentration ability.   Speech is normal.  Cranial nerves: Extraocular muscles were intact even with prolonged upgaze. Ptosis is better after surgery   He has slightly reduced neck extension strength.  The voice is strong.. Trapezius is strong.    No obvious hearing deficits are noted.  Motor:  Muscle bulk is normal.   Tone is normal. Strength is  5 / 5 in the arms shoulders and neck.  Strength is 4-/5 in the L5 innervated foot and ankle muscles on the left and 5/5 elsewhere in the left leg.  EHL is 4/5 right and 4+/5 left and he is 5/5 elsewhere on the right.  He can stand from a seated position 3 times (better) without using his arms.   Sensory: He has reduced sensation in the left foot over the L5 dermatome.  Coordination: Cerebellar testing reveals good finger-nose-finger and mildly  reduced left heel-to-shin .  Gait  and station: Station is normal.  Stride is reduced and gait is arthritic.  He turns 180 degrees in 4 steps.  He cannot do a tandem walk. Romberg is negative.   Reflexes: Deep tendon reflexes are symmetric and normal in the arms and knees but absent at the ankles    DIAGNOSTIC DATA (LABS, IMAGING, TESTING) - I reviewed patient records, labs, notes, testing and imaging myself where available.  Lab Results  Component Value Date   WBC 6.1 11/17/2019   HGB 13.1 11/17/2019   HCT 41.6 11/17/2019   MCV 81 11/17/2019   PLT 289 11/17/2019      Component Value Date/Time   NA 140 11/17/2019 1419   K 4.1 11/17/2019 1419   CL 100 11/17/2019 1419   CO2 27 11/17/2019 1419   GLUCOSE 82 11/17/2019 1419   GLUCOSE 114 (H) 08/31/2019 0828   BUN 13 11/17/2019 1419   CREATININE 0.65 (L) 11/17/2019 1419   CALCIUM 9.1 11/17/2019 1419   PROT 6.5 11/17/2019 1419   ALBUMIN 4.2 11/17/2019 1419   AST 17 11/17/2019 1419   ALT 10 11/17/2019 1419   ALKPHOS 78 11/17/2019 1419   BILITOT 0.8 11/17/2019 1419   GFRNONAA 91 11/17/2019 1419   GFRAA 105 11/17/2019 1419   No results found for: CHOL, HDL, LDLCALC, LDLDIRECT, TRIG, CHOLHDL Lab Results  Component Value Date   HGBA1C 6.6 (H) 10/24/2014   Lab Results  Component Value Date   TKZSWFUX32 355 11/17/2019   Lab Results  Component Value Date   TSH 1.402 10/24/2014       ASSESSMENT AND PLAN    1. Myasthenia gravis ([place])   2. High risk medication use   3. Gait abnormality   4. OSA (obstructive sleep apnea)   5. Urinary frequency   6. Degenerative lumbar spinal stenosis     1.. Continue CellCept 500 mg twice a day and pyridostigmine 60 mg 8 pills a day for the myasthenia gravis.  He has labwork scheduled on Friday with PCP  If symptoms worsen consider IVIG. 2.  Continue desmopressin and try Myrbetriq.   Avoid oxybutynin due to age and it may worsen MG.   3.   With weight loss ok to stop CPAP.Marland Kitchen   4.    He will return to see me in 5-6  months but call sooner if he has new or worsening neurologic symptoms.      Aser Nylund A. Felecia Shelling, MD, PhD 73/22/0254, 2:70 PM Certified in Neurology, Clinical Neurophysiology, Sleep Medicine, Pain Medicine and Neuroimaging  [facility] [address], [place], [place] [address] [phone]

## 2020-05-23 DIAGNOSIS — Z1212 Encounter for screening for malignant neoplasm of rectum: Secondary | ICD-10-CM | POA: Diagnosis not present

## 2020-05-25 ENCOUNTER — Ambulatory Visit (INDEPENDENT_AMBULATORY_CARE_PROVIDER_SITE_OTHER): Payer: Medicare Other

## 2020-05-25 DIAGNOSIS — I442 Atrioventricular block, complete: Secondary | ICD-10-CM | POA: Diagnosis not present

## 2020-05-26 LAB — CUP PACEART REMOTE DEVICE CHECK
Battery Impedance: 509 Ohm
Battery Remaining Longevity: 86 mo
Battery Voltage: 2.78 V
Brady Statistic AP VP Percent: 9 %
Brady Statistic AP VS Percent: 0 %
Brady Statistic AS VP Percent: 91 %
Brady Statistic AS VS Percent: 1 %
Date Time Interrogation Session: 20211029152510
Implantable Lead Implant Date: 20160329
Implantable Lead Implant Date: 20160329
Implantable Lead Location: 753859
Implantable Lead Location: 753860
Implantable Lead Model: 5076
Implantable Lead Model: 5092
Implantable Pulse Generator Implant Date: 20160329
Lead Channel Impedance Value: 456 Ohm
Lead Channel Impedance Value: 654 Ohm
Lead Channel Pacing Threshold Amplitude: 0.625 V
Lead Channel Pacing Threshold Amplitude: 0.625 V
Lead Channel Pacing Threshold Pulse Width: 0.4 ms
Lead Channel Pacing Threshold Pulse Width: 0.4 ms
Lead Channel Setting Pacing Amplitude: 2 V
Lead Channel Setting Pacing Amplitude: 2.5 V
Lead Channel Setting Pacing Pulse Width: 0.4 ms
Lead Channel Setting Sensing Sensitivity: 2.8 mV

## 2020-05-29 NOTE — Progress Notes (Signed)
Remote pacemaker transmission.   

## 2020-06-11 ENCOUNTER — Telehealth: Payer: Self-pay | Admitting: Neurology

## 2020-06-11 NOTE — Telephone Encounter (Signed)
Tried calling pt, got busy signal. Please let pt know Dr. Felecia Shelling ok with him getting covid-19 vaccine and flu vaccine.

## 2020-06-11 NOTE — Telephone Encounter (Signed)
Fine to proceed with both of these

## 2020-06-11 NOTE — Telephone Encounter (Signed)
Pt  Is asking for a call to discuss if there are any concerns with him  preparing for Covid-19 vaccine and flu shot and if this will impact any of his medications.  Please call

## 2020-06-12 NOTE — Telephone Encounter (Signed)
FYI relayed message to pt.

## 2020-06-12 NOTE — Telephone Encounter (Signed)
Tried calling pt again, got busy signal. Please relay below info if he calls back.

## 2020-06-12 NOTE — Telephone Encounter (Signed)
Noted, thank you

## 2020-06-14 DIAGNOSIS — R634 Abnormal weight loss: Secondary | ICD-10-CM | POA: Diagnosis not present

## 2020-06-14 DIAGNOSIS — K6389 Other specified diseases of intestine: Secondary | ICD-10-CM | POA: Diagnosis not present

## 2020-06-14 DIAGNOSIS — R198 Other specified symptoms and signs involving the digestive system and abdomen: Secondary | ICD-10-CM | POA: Diagnosis not present

## 2020-06-18 ENCOUNTER — Other Ambulatory Visit: Payer: Self-pay | Admitting: Neurology

## 2020-06-25 DIAGNOSIS — R634 Abnormal weight loss: Secondary | ICD-10-CM | POA: Diagnosis not present

## 2020-07-31 DIAGNOSIS — L570 Actinic keratosis: Secondary | ICD-10-CM | POA: Diagnosis not present

## 2020-07-31 DIAGNOSIS — D225 Melanocytic nevi of trunk: Secondary | ICD-10-CM | POA: Diagnosis not present

## 2020-07-31 DIAGNOSIS — L821 Other seborrheic keratosis: Secondary | ICD-10-CM | POA: Diagnosis not present

## 2020-07-31 DIAGNOSIS — Z85828 Personal history of other malignant neoplasm of skin: Secondary | ICD-10-CM | POA: Diagnosis not present

## 2020-08-02 ENCOUNTER — Telehealth: Payer: Self-pay | Admitting: Podiatry

## 2020-08-02 DIAGNOSIS — N3946 Mixed incontinence: Secondary | ICD-10-CM | POA: Diagnosis not present

## 2020-08-02 DIAGNOSIS — R350 Frequency of micturition: Secondary | ICD-10-CM | POA: Diagnosis not present

## 2020-08-02 NOTE — Telephone Encounter (Signed)
The patient left a voicemail stating that he wants to get his right toenail removed but he has some questions before scheduling about the procedure. He last saw you in 2019 and wants to come back in, but doesn't want to schedule until some questions have been answered.

## 2020-08-17 NOTE — Telephone Encounter (Signed)
He will need an appointment to discuss this. I can do a phone visit or a virtual visit but since it's been so long it will have to be a formal visit

## 2020-08-24 ENCOUNTER — Other Ambulatory Visit: Payer: Self-pay

## 2020-08-24 ENCOUNTER — Ambulatory Visit: Payer: Medicare Other | Admitting: Podiatry

## 2020-08-24 DIAGNOSIS — Z7982 Long term (current) use of aspirin: Secondary | ICD-10-CM | POA: Insufficient documentation

## 2020-08-24 DIAGNOSIS — K649 Unspecified hemorrhoids: Secondary | ICD-10-CM | POA: Insufficient documentation

## 2020-08-24 DIAGNOSIS — M79676 Pain in unspecified toe(s): Secondary | ICD-10-CM

## 2020-08-24 DIAGNOSIS — L600 Ingrowing nail: Secondary | ICD-10-CM

## 2020-08-24 DIAGNOSIS — E559 Vitamin D deficiency, unspecified: Secondary | ICD-10-CM | POA: Insufficient documentation

## 2020-08-24 DIAGNOSIS — E118 Type 2 diabetes mellitus with unspecified complications: Secondary | ICD-10-CM | POA: Insufficient documentation

## 2020-08-24 DIAGNOSIS — R251 Tremor, unspecified: Secondary | ICD-10-CM | POA: Insufficient documentation

## 2020-08-24 DIAGNOSIS — L603 Nail dystrophy: Secondary | ICD-10-CM | POA: Diagnosis not present

## 2020-08-24 DIAGNOSIS — R197 Diarrhea, unspecified: Secondary | ICD-10-CM | POA: Insufficient documentation

## 2020-08-24 DIAGNOSIS — L570 Actinic keratosis: Secondary | ICD-10-CM | POA: Insufficient documentation

## 2020-08-24 DIAGNOSIS — Z8781 Personal history of (healed) traumatic fracture: Secondary | ICD-10-CM | POA: Insufficient documentation

## 2020-08-24 DIAGNOSIS — D849 Immunodeficiency, unspecified: Secondary | ICD-10-CM | POA: Insufficient documentation

## 2020-08-24 DIAGNOSIS — R238 Other skin changes: Secondary | ICD-10-CM | POA: Insufficient documentation

## 2020-08-24 DIAGNOSIS — Z950 Presence of cardiac pacemaker: Secondary | ICD-10-CM | POA: Insufficient documentation

## 2020-08-24 DIAGNOSIS — Z9989 Dependence on other enabling machines and devices: Secondary | ICD-10-CM | POA: Insufficient documentation

## 2020-08-24 DIAGNOSIS — N3944 Nocturnal enuresis: Secondary | ICD-10-CM | POA: Insufficient documentation

## 2020-08-24 DIAGNOSIS — Z87448 Personal history of other diseases of urinary system: Secondary | ICD-10-CM | POA: Insufficient documentation

## 2020-08-24 DIAGNOSIS — H9190 Unspecified hearing loss, unspecified ear: Secondary | ICD-10-CM | POA: Insufficient documentation

## 2020-08-24 DIAGNOSIS — Z8679 Personal history of other diseases of the circulatory system: Secondary | ICD-10-CM | POA: Insufficient documentation

## 2020-08-24 DIAGNOSIS — R233 Spontaneous ecchymoses: Secondary | ICD-10-CM | POA: Insufficient documentation

## 2020-08-24 DIAGNOSIS — I459 Conduction disorder, unspecified: Secondary | ICD-10-CM | POA: Insufficient documentation

## 2020-08-24 DIAGNOSIS — Z8719 Personal history of other diseases of the digestive system: Secondary | ICD-10-CM | POA: Insufficient documentation

## 2020-08-24 DIAGNOSIS — Z800 Family history of malignant neoplasm of digestive organs: Secondary | ICD-10-CM | POA: Insufficient documentation

## 2020-08-25 DIAGNOSIS — I2510 Atherosclerotic heart disease of native coronary artery without angina pectoris: Secondary | ICD-10-CM | POA: Diagnosis not present

## 2020-08-25 DIAGNOSIS — E118 Type 2 diabetes mellitus with unspecified complications: Secondary | ICD-10-CM | POA: Diagnosis not present

## 2020-08-25 DIAGNOSIS — E7800 Pure hypercholesterolemia, unspecified: Secondary | ICD-10-CM | POA: Diagnosis not present

## 2020-08-25 DIAGNOSIS — I10 Essential (primary) hypertension: Secondary | ICD-10-CM | POA: Diagnosis not present

## 2020-08-27 NOTE — Progress Notes (Signed)
  Subjective:  Patient ID: David [name]., male    DOB: 21-Aug-1935,  MRN: [id number]  Chief Complaint  Patient presents with  . Nail Problem    Pt requests total nail avulsion right 1st toenail.    85 y.o. male presents with the above complaint. History confirmed with patient.   Objective:  Physical Exam: warm, good capillary refill, no trophic changes or ulcerative lesions, normal sensory exam and PT reduced right. Left Foot: Left hallux nail avulsion site well-healed Right Foot: Right hallux nail thickened dystrophic with pain to palpation  Assessment:   1. Ingrown nail   2. Pain around toenail   3. Nail dystrophy    Plan:  Patient was evaluated and treated and all questions answered.  Ingrown Nail Right, nail dystrophy -Patient would like to go ahead and have the right great toe permanently and totally removed.  We did discuss that he is at higher risk of this however I do think the risks are acceptable for such a minor procedure.  Possible risks include delayed wound healing and increased risk of infection.  Patient elects to proceed we will plan for the procedure in the coming weeks  No follow-ups on file.

## 2020-08-28 ENCOUNTER — Telehealth: Payer: Self-pay | Admitting: Internal Medicine

## 2020-08-28 NOTE — Telephone Encounter (Addendum)
Contacted patient by phone about not receiving his transmission scheduled for January. Patient states that he is getting an invalid e-mail and password error on the app. Patient states that he tried to contact [employer] support but could not get through. He also states that he has tried to get his transmission to transmit for an hour with no results. Patient also requesting a pacemaker ID card. Patient transferred to [facility] Specialist who has called [employer] with patient on the phone.

## 2020-08-28 NOTE — Telephone Encounter (Signed)
The patient was not with his monitor. The app is on his wife phone and he was at his office. He hung up the phone.  I called his wife and explained to her that they need to call  [employer] later today to get additional help. I let her know I did order the pacemaker ID card and they should receive it in 7-10 business days.

## 2020-08-28 NOTE — Telephone Encounter (Signed)
New Message:    Pt said he received a call that you did not receive his transmission.

## 2020-09-06 DIAGNOSIS — G7000 Myasthenia gravis without (acute) exacerbation: Secondary | ICD-10-CM | POA: Diagnosis not present

## 2020-09-06 DIAGNOSIS — Z09 Encounter for follow-up examination after completed treatment for conditions other than malignant neoplasm: Secondary | ICD-10-CM | POA: Diagnosis not present

## 2020-09-11 ENCOUNTER — Other Ambulatory Visit: Payer: Self-pay

## 2020-09-11 ENCOUNTER — Ambulatory Visit: Payer: Medicare Other | Admitting: Podiatry

## 2020-09-11 DIAGNOSIS — L600 Ingrowing nail: Secondary | ICD-10-CM

## 2020-09-11 NOTE — Patient Instructions (Signed)

## 2020-09-20 DIAGNOSIS — H02433 Paralytic ptosis of bilateral eyelids: Secondary | ICD-10-CM | POA: Diagnosis not present

## 2020-09-20 DIAGNOSIS — H019 Unspecified inflammation of eyelid: Secondary | ICD-10-CM | POA: Diagnosis not present

## 2020-09-20 DIAGNOSIS — G7000 Myasthenia gravis without (acute) exacerbation: Secondary | ICD-10-CM | POA: Diagnosis not present

## 2020-09-20 DIAGNOSIS — H02831 Dermatochalasis of right upper eyelid: Secondary | ICD-10-CM | POA: Diagnosis not present

## 2020-09-24 DIAGNOSIS — I2510 Atherosclerotic heart disease of native coronary artery without angina pectoris: Secondary | ICD-10-CM | POA: Diagnosis not present

## 2020-09-24 DIAGNOSIS — I10 Essential (primary) hypertension: Secondary | ICD-10-CM | POA: Diagnosis not present

## 2020-09-24 DIAGNOSIS — E118 Type 2 diabetes mellitus with unspecified complications: Secondary | ICD-10-CM | POA: Diagnosis not present

## 2020-09-24 DIAGNOSIS — E7800 Pure hypercholesterolemia, unspecified: Secondary | ICD-10-CM | POA: Diagnosis not present

## 2020-09-28 ENCOUNTER — Other Ambulatory Visit: Payer: Self-pay

## 2020-09-28 ENCOUNTER — Ambulatory Visit: Payer: Medicare Other | Admitting: Podiatry

## 2020-09-28 DIAGNOSIS — M79676 Pain in unspecified toe(s): Secondary | ICD-10-CM

## 2020-09-28 DIAGNOSIS — L600 Ingrowing nail: Secondary | ICD-10-CM

## 2020-09-28 NOTE — Progress Notes (Signed)
  Subjective:  Patient ID: David [name]., male    DOB: [birth year],  MRN: [id number]  Chief Complaint  Patient presents with  . Follow-up    Nail check- doing much better- no more drainage on bandage- further evalaution     85 y.o. male presents for follow up of nail procedure. History confirmed with patient.   Objective:  Physical Exam: Ingrown nail avulsion site: overlying soft crust, no warmth, no drainage and no erythema Assessment:   1. Ingrown nail   2. Pain around toenail      Plan:  Patient was evaluated and treated and all questions answered.  S/p Ingrown Toenail Excision, right -Healing well without issue. -Discussed return precautions. -F/u PRN

## 2020-10-01 NOTE — Progress Notes (Signed)
°  Subjective:  Patient ID: [name]., male    DOB: [birth year],  MRN: [id number]  No chief complaint on file.  85 y.o. male presents for planned right great toenail removal.  Objective:  Physical Exam: warm, good capillary refill, no trophic changes or ulcerative lesions, normal sensory exam and PT reduced right. Left Foot: Left hallux nail avulsion site well-healed Right Foot: Right hallux nail thickened dystrophic with pain to palpation  Assessment:   1. Ingrown nail    Plan:  Patient was evaluated and treated and all questions answered.  Ingrown Nail Right, nail dystrophy -Proceed with ingrown nail as planned. Given concern of circulation no tourniquet or exsanguination used.  Procedure: Excision of Toenail Location: Right 1st Anesthesia: Lidocaine 1% plain; 29mL, digital block. Skin Prep: Betadine. Dressing: Silvadene; telfa; dry, sterile, compression dressing. Technique: Following skin prep, the nail was freed and excised. The area was cleansed. Sterile dressing applied. Disposition: Patient tolerated procedure well. Patient to return in 2 weeks for follow-up.   No follow-ups on file.

## 2020-10-19 ENCOUNTER — Ambulatory Visit: Payer: Medicare Other | Admitting: Podiatry

## 2020-10-19 ENCOUNTER — Other Ambulatory Visit: Payer: Self-pay

## 2020-10-19 DIAGNOSIS — L600 Ingrowing nail: Secondary | ICD-10-CM

## 2020-10-19 DIAGNOSIS — M79676 Pain in unspecified toe(s): Secondary | ICD-10-CM | POA: Diagnosis not present

## 2020-10-19 NOTE — Progress Notes (Signed)
  Subjective:  Patient ID: David [name]., male    DOB: 1935/12/26,  MRN: [id number]  Chief Complaint  Patient presents with  . Nail Problem    Nail check   85 y.o. male presents for follow up of nail procedure. History confirmed with patient. States the nail is healing well.   Objective:  Physical Exam: Ingrown nail avulsion site: overlying soft crust, no warmth, no drainage and no erythema Assessment:   1. Ingrown nail   2. Pain around toenail    Plan:  Patient was evaluated and treated and all questions answered.  S/p Ingrown Toenail Excision, right -Healing well without issue. -Discussed return precautions. -F/u PRN

## 2020-11-20 ENCOUNTER — Encounter: Payer: Self-pay | Admitting: Neurology

## 2020-11-20 ENCOUNTER — Other Ambulatory Visit: Payer: Self-pay

## 2020-11-20 ENCOUNTER — Ambulatory Visit: Payer: Medicare Other | Admitting: Neurology

## 2020-11-20 VITALS — BP 125/75 | HR 70 | Ht 69.0 in | Wt 149.5 lb

## 2020-11-20 DIAGNOSIS — M47817 Spondylosis without myelopathy or radiculopathy, lumbosacral region: Secondary | ICD-10-CM

## 2020-11-20 DIAGNOSIS — R350 Frequency of micturition: Secondary | ICD-10-CM

## 2020-11-20 DIAGNOSIS — G4733 Obstructive sleep apnea (adult) (pediatric): Secondary | ICD-10-CM

## 2020-11-20 DIAGNOSIS — R269 Unspecified abnormalities of gait and mobility: Secondary | ICD-10-CM

## 2020-11-20 DIAGNOSIS — Z79899 Other long term (current) drug therapy: Secondary | ICD-10-CM

## 2020-11-20 DIAGNOSIS — G7000 Myasthenia gravis without (acute) exacerbation: Secondary | ICD-10-CM

## 2020-11-20 DIAGNOSIS — N529 Male erectile dysfunction, unspecified: Secondary | ICD-10-CM

## 2020-11-20 MED ORDER — SILDENAFIL CITRATE 20 MG PO TABS: ORAL_TABLET | ORAL | 3 refills | Status: DC

## 2020-11-20 NOTE — Progress Notes (Signed)
[facility]  PATIENT: David [name]. DOB: 1935/10/31  REFERRING DOCTOR OR PCP:  [name] (PCP) and Rutherford Guys (Ophtho) SOURCE: Patient, records from Dr. Shelia Media, Lab results, CPAP download  _________________________________   HISTORICAL  CHIEF COMPLAINT:  Chief Complaint  Patient presents with  . Follow-up    RM 12. Last seen 05/22/2020. Takes cellcept/mestinon for MG. Ambulates w/ cane.    HISTORY OF PRESENT ILLNESS:  David [name]  is a 85 y.o. man with myasthenia gravis, and OSA.     Update 11/20/2020: Myasthenia is doing better.   Due to continued ptosis he had blepharoplasty. .   He has mild proximal weakness but can stand up without using his arms. He is on Cellcept 500 mg po bid and Mestinon up to 8 pills (60 mg each) daily.     He does have some diarrhea and takes imodium.   He has nocturia  4-6 times nightly  Desmopressin has helped have less nocturia at night he is on Myrbetriq and tamsulosin.  Due to cognitive changes, prefer to stay away from the anticholinergic agents.  He reports erectile dysfunction.  He has a pacemaker.  Although he has nitroglycerin tablets, he denies any angina and has never taken any of those tablets..     He takes trazodone for insomnia and tolerates it well.   It has helped.  He has nocturia but falls asleep easily after using the toilet.   He lost 75 pounds in last 2 years but has regained 10 pounds this year.  He no longer snores and stopped CPAP.      His back and leg pain improved with his weight loss.  .  He has severe spinal stenosis at L3-L4 and also has a pars defect with anterolisthesis at L5-S1.  He has not needed ESIs recently.  MG History:   He had the onset of ptosis, while traveling in [place] and [place], about 3 weeks ago. He first noted the ptosis while trying to watch a movie in the bus (screen was up high) and noting that the eyelids were covering his vision.  He did note the trip was stressful  and he and his wife both had injuries (he sprained ankle and wife broke shoulder).   Lab work was performed and the acetylcholine binding antibodies were elevated at 1.85 (less than 0.24 normal). 57% were blocking antibodies and 25% or modulating antibodies.   Other lab work was noncontributory.     CT scan of the head showed age-related mild chronic microvascular ischemic change.      REVIEW OF SYSTEMS: Constitutional: No fevers, chills, sweats, or change in appetite Eyes: No visual changes, double vision, eye pain.   He notes some ptosis which reduces his visual fields superiorly Ear, nose and throat: No hearing loss, ear pain, nasal congestion, sore throat Cardiovascular: No chest pain, palpitations Respiratory: No shortness of breath at rest or with exertion.   No wheezes.  He has OSA and is having trouble using CPAP. GastrointestinaI: No nausea, vomiting, diarrhea, abdominal pain, fecal incontinence Genitourinary: No dysuria, urinary retention or frequency.  No nocturia. Musculoskeletal: No neck pain, back pain Integumentary: No rashes. Neurological: as above Psychiatric: No depression at this time.  No anxiety Endocrine: No palpitations, diaphoresis, change in appetite, change in weigh or increased thirst Hematologic/Lymphatic: No anemia, purpura, petechiae. Allergic/Immunologic: No itchy/runny eyes, nasal congestion, recent allergic reactions, rashes  ALLERGIES: Allergies  Allergen Reactions  . Azithromycin Other (See Comments)  .  Clindamycin Hcl Other (See Comments)  . Doxycycline Diarrhea  . Doxycycline Hyclate Other (See Comments)  . Levofloxacin Other (See Comments)  . Niacin Other (See Comments)  . Gris-Peg [Griseofulvin] Other (See Comments)    headaches  . Terbinafine Other (See Comments)    headache    HOME MEDICATIONS:  Current Outpatient Medications:  .  aspirin EC 81 MG tablet, Take 81 mg by mouth daily. , Disp: , Rfl:  .  desmopressin (DDAVP) 0.1 MG  tablet, TAKE 1 TABLET(0.1 MG) BY MOUTH AT BEDTIME, Disp: 30 tablet, Rfl: 5 .  doxycycline (VIBRA-TABS) 100 MG tablet, Take 100 mg by mouth 2 (two) times daily., Disp: , Rfl:  .  Ensure (ENSURE), See admin instructions., Disp: , Rfl:  .  ezetimibe-simvastatin (VYTORIN) 10-40 MG tablet, Take 1 tablet by mouth daily., Disp: , Rfl:  .  loratadine (CLARITIN) 10 MG tablet, Take 10 mg by mouth daily., Disp: , Rfl:  .  mycophenolate (CELLCEPT) 250 MG capsule, TAKE 2 CAPSULES BY MOUTH TWICE DAILY, Disp: 120 capsule, Rfl: 5 .  MYRBETRIQ 50 MG TB24 tablet, Take 50 mg by mouth daily., Disp: , Rfl:  .  neomycin-polymyxin-dexameth (MAXITROL) 0.1 % OINT, neomycin 3.5 mg/g-polymyxin B 10,000 unit/g-dexameth 0.1 % eye oint  APPLY SMALL AMOUNT ONTO SUTURES OR OPERATIVE SITE TWICE DAILY FOR 3 DAYS THEN STOP, Disp: , Rfl:  .  nitroGLYCERIN (NITROSTAT) 0.4 MG SL tablet, Place 1 tablet (0.4 mg total) under the tongue every 5 (five) minutes as needed for chest pain. Reported on 11/01/2015, Disp: 25 tablet, Rfl: 1 .  phenazopyridine (PYRIDIUM) 100 MG tablet, 2 tablets after meals, Disp: , Rfl:  .  Polyethyl Glycol-Propyl Glycol (SYSTANE) 0.4-0.3 % SOLN, See admin instructions., Disp: , Rfl:  .  pyridostigmine (MESTINON) 60 MG tablet, Take up to 5-9 pills per day, Disp: 780 tablet, Rfl: 3 .  rifaximin (XIFAXAN) 550 MG TABS tablet, Xifaxan 550 mg tablet, Disp: , Rfl:  .  saccharomyces boulardii (FLORASTOR) 250 MG capsule, 1 capsule, Disp: , Rfl:  .  tamsulosin (FLOMAX) 0.4 MG CAPS capsule, Take 1 capsule (0.4 mg total) by mouth as needed., Disp: 30 capsule, Rfl: 6 .  traZODone (DESYREL) 50 MG tablet, TAKE 1/2 TO 1 TABLET BY MOUTH EVERY NIGHT AT BEDTIME, Disp: 30 tablet, Rfl: 5 .  triamcinolone (KENALOG) 0.1 %, 1 application, Disp: , Rfl:  .  triamcinolone ointment (KENALOG) 0.1 %, Apply topically., Disp: , Rfl:  .  Wheat Dextrin (BENEFIBER) POWD, See admin instructions., Disp: , Rfl:   PAST MEDICAL HISTORY: Past Medical  History:  Diagnosis Date  . CAD (coronary artery disease)    previous stent to LAD 2003  . Diabetes mellitus without complication ([place])    No longer taking diabetic medications at this time  . History of complete heart block   . History of kidney stones   . Hyperlipidemia   . Hypertension   . LBBB (left bundle branch block)   . Melanoma ([place])   . Myasthenia gravis (H[facility])    eyelids, finger, and toes  . NSVT (nonsustained ventricular tachycardia) ([place])   . OSA (obstructive sleep apnea)    Does not wear CPAP.  Marland Kitchen Presence of permanent cardiac pacemaker   . S/P cardiac pacemaker procedure, placement 10/24/14 Medtronic Adapta L model ADDRL 1  10/25/2014  . Spinal stenosis    Severe    PAST SURGICAL HISTORY: Past Surgical History:  Procedure Laterality Date  . COLONOSCOPY    . CORONARY ANGIOPLASTY  WITH STENT PLACEMENT  03/01/2002   stenting to LAD - Dr. Glade Lloyd  . CYSTOSCOPY/URETEROSCOPY/HOLMIUM LASER/STENT PLACEMENT Left 08/31/2019   Procedure: CYSTOSCOPY/RETROGRADE/STENT PLACEMENT;  Surgeon: Ceasar Mons, MD;  Location: [facility];  Service: Urology;  Laterality: Left;  ONY NEEDS 45 MIN  . NM MYOCAR PERF WALL MOTION  11/27/2010   normal  . PERMANENT PACEMAKER INSERTION N/A 10/24/2014   Procedure: PERMANENT PACEMAKER INSERTION;  Surgeon: [name], MD;  Location[facility] LAB;  Service: Cardiovascular;  Laterality: N/A;  . TONSILLECTOMY    . TRANSURETHRAL RESECTION OF PROSTATE N/A 07/06/2019   Procedure: TRANSURETHRAL RESECTION OF THE PROSTATE (TURP), BIPOLAR;  Surgeon: Ceasar Mons, MD;  Location: [facility];  Service: Urology;  Laterality: N/A;  . US ECHOCARDIOGRAPHY  12/05/2010   proximal septal thickening,mild concentric LVH,mild deptal hypokinesis,RV mildly dilated,LA mildly dilated,mild AI,moderate aortic root dilatation,septal motion c/w conduction abnormality    FAMILY HISTORY: Family History  Problem Relation Age of  Onset  . Breast cancer Mother   . Colon cancer Father   . Lung cancer Father     SOCIAL HISTORY:  Social History   Socioeconomic History  . Marital status: Married    Spouse name: Not on file  . Number of children: Not on file  . Years of education: Not on file  . Highest education level: Not on file  Occupational History  . Not on file  Tobacco Use  . Smoking status: Former Smoker    Types: Cigarettes, Cigars    Start date: 04/28/1949    Quit date: 10/27/1982    Years since quitting: 38.0  . Smokeless tobacco: Never Used  Vaping Use  . Vaping Use: Never used  Substance and Sexual Activity  . Alcohol use: Not Currently    Comment: 1-2 drink per week  . Drug use: No  . Sexual activity: Not on file  Other Topics Concern  . Not on file  Social History Narrative  . Not on file   Social Determinants of Health   Financial Resource Strain: Not on file  Food Insecurity: Not on file  Transportation Needs: Not on file  Physical Activity: Not on file  Stress: Not on file  Social Connections: Not on file  Intimate Partner Violence: Not on file     PHYSICAL EXAM  Vitals:   11/20/20 1439  BP: 125/75  Pulse: 70  SpO2: 94%  Weight: 149 lb 8 oz (67.8 kg)  Height: 5\' 9"  (1.753 m)    Body mass index is 22.08 kg/m.     General: The patient is well-developed and well-nourished and in no acute distress   Neurologic Exam  Mental status: The patient is alert and oriented x 3 at the time of the examination. The patient has apparent normal recent and remote memory, with an apparently normal attention span and concentration ability.   Speech is normal.  Cranial nerves: Extraocular muscles were intact even with prolonged upgaze. Ptosis is better after surgery   He has slightly reduced neck extension strength.  The voice is strong.. Trapezius is strong.    No obvious hearing deficits are noted.  Motor:  Muscle bulk is normal.   Tone is normal. Strength is  5 / 5 in the arms  shoulders and neck.  Strength is 4-/5 in the L5 innervated foot and ankle muscles on the left and 5/5 elsewhere in the left leg.  EHL is 4/5 right and 4+/5 left and he is 5/5 elsewhere  on the right.  He can only stand up once from a seated position without using his arms today (was 3 times last time).   Sensory: He has reduced sensation in the left foot over the L5 dermatome.  Coordination: Cerebellar testing reveals good finger-nose-finger and mildly reduced left heel-to-shin .  Gait and station: Station is normal.  Stride is reduced wide and gait is arthritic.  He turns 180 degrees in 4-5 steps.  He cannot do a tandem walk. Romberg is negative.   Reflexes: Deep tendon reflexes are symmetric and normal in the arms and knees but absent at the ankles    DIAGNOSTIC DATA (LABS, IMAGING, TESTING) - I reviewed patient records, labs, notes, testing and imaging myself where available.  Lab Results  Component Value Date   WBC 6.1 11/17/2019   HGB 13.1 11/17/2019   HCT 41.6 11/17/2019   MCV 81 11/17/2019   PLT 289 11/17/2019      Component Value Date/Time   NA 140 11/17/2019 1419   K 4.1 11/17/2019 1419   CL 100 11/17/2019 1419   CO2 27 11/17/2019 1419   GLUCOSE 82 11/17/2019 1419   GLUCOSE 114 (H) 08/31/2019 0828   BUN 13 11/17/2019 1419   CREATININE 0.65 (L) 11/17/2019 1419   CALCIUM 9.1 11/17/2019 1419   PROT 6.5 11/17/2019 1419   ALBUMIN 4.2 11/17/2019 1419   AST 17 11/17/2019 1419   ALT 10 11/17/2019 1419   ALKPHOS 78 11/17/2019 1419   BILITOT 0.8 11/17/2019 1419   GFRNONAA 91 11/17/2019 1419   GFRAA 105 11/17/2019 1419   No results found for: CHOL, HDL, LDLCALC, LDLDIRECT, TRIG, CHOLHDL Lab Results  Component Value Date   HGBA1C 6.6 (H) 10/24/2014   Lab Results  Component Value Date   [id number] 371 11/17/2019   Lab Results  Component Value Date   TSH 1.402 10/24/2014       ASSESSMENT AND PLAN    1. Myasthenia gravis ([place])   2. High risk medication use    3. Gait abnormality   4. Urinary frequency   5. OSA (obstructive sleep apnea)   6. Lumbosacral spondylosis without myelopathy   7. Erectile dysfunction, unspecified erectile dysfunction type     1.. Continue CellCept 500 mg twice a day and pyridostigmine 60 mg 8 pills a day for the myasthenia gravis.  We will check labs.   If symptoms worsen consider IVIG. 2.  Continue desmopressin.    3.   With weight will stay off CPAP..   [place] to try sildenafil for ED.  However, can not take NTG the same day (he has NTG tablets but has never used them) 5.    He will return to see me in 5-6 months but call sooner if he has new or worsening neurologic symptoms.      Cristal Howatt A. Felecia Shelling, MD, PhD 6/96/7893, 8:10 PM Certified in Neurology, Clinical Neurophysiology, Sleep Medicine, Pain Medicine and Neuroimaging  [facility] [address] [place], [place] [address] [phone]

## 2020-11-21 DIAGNOSIS — E119 Type 2 diabetes mellitus without complications: Secondary | ICD-10-CM | POA: Diagnosis not present

## 2020-11-21 DIAGNOSIS — E7800 Pure hypercholesterolemia, unspecified: Secondary | ICD-10-CM | POA: Diagnosis not present

## 2020-11-21 DIAGNOSIS — R197 Diarrhea, unspecified: Secondary | ICD-10-CM | POA: Diagnosis not present

## 2020-11-21 DIAGNOSIS — I10 Essential (primary) hypertension: Secondary | ICD-10-CM | POA: Diagnosis not present

## 2020-11-21 LAB — CBC WITH DIFFERENTIAL/PLATELET
Basophils Absolute: 0 10*3/uL (ref 0.0–0.2)
Basos: 0 %
EOS (ABSOLUTE): 0.1 10*3/uL (ref 0.0–0.4)
Eos: 1 %
Hematocrit: 40.9 % (ref 37.5–51.0)
Hemoglobin: 13.2 g/dL (ref 13.0–17.7)
Immature Grans (Abs): 0 10*3/uL (ref 0.0–0.1)
Immature Granulocytes: 0 %
Lymphocytes Absolute: 1.5 10*3/uL (ref 0.7–3.1)
Lymphs: 29 %
MCH: 29.3 pg (ref 26.6–33.0)
MCHC: 32.3 g/dL (ref 31.5–35.7)
MCV: 91 fL (ref 79–97)
Monocytes Absolute: 0.6 10*3/uL (ref 0.1–0.9)
Monocytes: 12 %
Neutrophils Absolute: 3 10*3/uL (ref 1.4–7.0)
Neutrophils: 58 %
Platelets: 190 10*3/uL (ref 150–450)
RBC: 4.5 x10E6/uL (ref 4.14–5.80)
RDW: 12.1 % (ref 11.6–15.4)
WBC: 5.2 10*3/uL (ref 3.4–10.8)

## 2020-11-21 LAB — COMPREHENSIVE METABOLIC PANEL
ALT: 17 IU/L (ref 0–44)
AST: 27 IU/L (ref 0–40)
Albumin/Globulin Ratio: 2.4 — ABNORMAL HIGH (ref 1.2–2.2)
Albumin: 4.1 g/dL (ref 3.6–4.6)
Alkaline Phosphatase: 71 IU/L (ref 44–121)
BUN/Creatinine Ratio: 23 (ref 10–24)
BUN: 11 mg/dL (ref 8–27)
Bilirubin Total: 0.9 mg/dL (ref 0.0–1.2)
CO2: 30 mmol/L — ABNORMAL HIGH (ref 20–29)
Calcium: 8.7 mg/dL (ref 8.6–10.2)
Chloride: 94 mmol/L — ABNORMAL LOW (ref 96–106)
Creatinine, Ser: 0.48 mg/dL — ABNORMAL LOW (ref 0.76–1.27)
Globulin, Total: 1.7 g/dL (ref 1.5–4.5)
Glucose: 81 mg/dL (ref 65–99)
Potassium: 4.1 mmol/L (ref 3.5–5.2)
Sodium: 135 mmol/L (ref 134–144)
Total Protein: 5.8 g/dL — ABNORMAL LOW (ref 6.0–8.5)
eGFR: 102 mL/min/{1.73_m2} (ref >59)

## 2020-11-21 LAB — TSH: TSH: 1.78 u[IU]/mL (ref 0.450–4.500)

## 2020-11-25 ENCOUNTER — Other Ambulatory Visit: Payer: Self-pay | Admitting: Neurology

## 2020-11-26 DIAGNOSIS — Z0000 Encounter for general adult medical examination without abnormal findings: Secondary | ICD-10-CM | POA: Diagnosis not present

## 2020-11-26 DIAGNOSIS — G7000 Myasthenia gravis without (acute) exacerbation: Secondary | ICD-10-CM | POA: Diagnosis not present

## 2020-11-26 DIAGNOSIS — G4733 Obstructive sleep apnea (adult) (pediatric): Secondary | ICD-10-CM | POA: Diagnosis not present

## 2020-11-26 DIAGNOSIS — I2510 Atherosclerotic heart disease of native coronary artery without angina pectoris: Secondary | ICD-10-CM | POA: Diagnosis not present

## 2020-11-26 DIAGNOSIS — I517 Cardiomegaly: Secondary | ICD-10-CM | POA: Diagnosis not present

## 2020-11-26 DIAGNOSIS — R0989 Other specified symptoms and signs involving the circulatory and respiratory systems: Secondary | ICD-10-CM | POA: Diagnosis not present

## 2020-11-27 DIAGNOSIS — N401 Enlarged prostate with lower urinary tract symptoms: Secondary | ICD-10-CM | POA: Diagnosis not present

## 2020-11-27 DIAGNOSIS — R350 Frequency of micturition: Secondary | ICD-10-CM | POA: Diagnosis not present

## 2020-11-28 DIAGNOSIS — R197 Diarrhea, unspecified: Secondary | ICD-10-CM | POA: Diagnosis not present

## 2020-12-17 ENCOUNTER — Other Ambulatory Visit: Payer: Self-pay | Admitting: Neurology

## 2020-12-17 ENCOUNTER — Ambulatory Visit (INDEPENDENT_AMBULATORY_CARE_PROVIDER_SITE_OTHER): Payer: Medicare Other

## 2020-12-17 DIAGNOSIS — I442 Atrioventricular block, complete: Secondary | ICD-10-CM

## 2020-12-17 DIAGNOSIS — H524 Presbyopia: Secondary | ICD-10-CM | POA: Diagnosis not present

## 2020-12-17 DIAGNOSIS — H5213 Myopia, bilateral: Secondary | ICD-10-CM | POA: Diagnosis not present

## 2020-12-17 DIAGNOSIS — H25813 Combined forms of age-related cataract, bilateral: Secondary | ICD-10-CM | POA: Diagnosis not present

## 2020-12-17 DIAGNOSIS — H52203 Unspecified astigmatism, bilateral: Secondary | ICD-10-CM | POA: Diagnosis not present

## 2020-12-19 LAB — CUP PACEART REMOTE DEVICE CHECK
Battery Impedance: 637 Ohm
Battery Remaining Longevity: 76 mo
Battery Voltage: 2.78 V
Brady Statistic AP VP Percent: 10 %
Brady Statistic AP VS Percent: 0 %
Brady Statistic AS VP Percent: 83 %
Brady Statistic AS VS Percent: 8 %
Date Time Interrogation Session: 20220520145810
Implantable Lead Implant Date: 20160329
Implantable Lead Implant Date: 20160329
Implantable Lead Location: 753859
Implantable Lead Location: 753860
Implantable Lead Model: 5076
Implantable Lead Model: 5092
Implantable Pulse Generator Implant Date: 20160329
Lead Channel Impedance Value: 388 Ohm
Lead Channel Impedance Value: 565 Ohm
Lead Channel Pacing Threshold Amplitude: 0.5 V
Lead Channel Pacing Threshold Amplitude: 0.625 V
Lead Channel Pacing Threshold Pulse Width: 0.4 ms
Lead Channel Pacing Threshold Pulse Width: 0.4 ms
Lead Channel Setting Pacing Amplitude: 2 V
Lead Channel Setting Pacing Amplitude: 2.5 V
Lead Channel Setting Pacing Pulse Width: 0.4 ms
Lead Channel Setting Sensing Sensitivity: 2.8 mV

## 2021-01-04 NOTE — Progress Notes (Signed)
Remote pacemaker transmission.   

## 2021-02-04 NOTE — Progress Notes (Signed)
HPI: Follow-up coronary artery disease, hypertension and history of complete heart block status post pacemaker.  Patient is followed by Dr. Rayann Heman.  Patient had PCI of his LAD in 2003.  He had a pacemaker placed in 2016.  Echocardiogram March 2016 showed normal LV function, moderate left ventricular hypertrophy, grade 1 diastolic dysfunction, mild aortic insufficiency, mildly dilated aortic root, moderate tricuspid regurgitation.  Nuclear study September 2016 showed ejection fraction 43%, inferior thinning but no ischemia.  Cholesterol panel April 2022 showed total cholesterol 122, HDL 58 and LDL 52.  Liver functions normal.  Since last seen patient does have some dyspnea on exertion.  No orthopnea or PND.  He has noticed increased bilateral lower extremity edema.  No chest pain, palpitations or syncope.  Current Outpatient Medications  Medication Sig Dispense Refill   aspirin EC 81 MG tablet Take 81 mg by mouth daily.      desmopressin (DDAVP) 0.1 MG tablet TAKE 1 TABLET(0.1 MG) BY MOUTH AT BEDTIME 30 tablet 5   doxycycline (VIBRA-TABS) 100 MG tablet Take 100 mg by mouth 2 (two) times daily.     Ensure (ENSURE) See admin instructions.     ezetimibe-simvastatin (VYTORIN) 10-40 MG tablet Take 1 tablet by mouth daily.     loratadine (CLARITIN) 10 MG tablet Take 10 mg by mouth daily.     mycophenolate (CELLCEPT) 250 MG capsule TAKE 2 CAPSULES BY MOUTH TWICE DAILY 120 capsule 5   MYRBETRIQ 50 MG TB24 tablet Take 50 mg by mouth daily.     Polyethyl Glycol-Propyl Glycol (SYSTANE) 0.4-0.3 % SOLN See admin instructions.     pyridostigmine (MESTINON) 60 MG tablet Take up to 5-9 pills per day 780 tablet 3   sildenafil (REVATIO) 20 MG tablet One po prn for ED 1/2 hour before sexual relations.   Do not take nitroglycerin the same day 6 tablet 3   tamsulosin (FLOMAX) 0.4 MG CAPS capsule Take 1 capsule (0.4 mg total) by mouth as needed. 30 capsule 6   triamcinolone (KENALOG) 0.1 % 1 application      triamcinolone ointment (KENALOG) 0.1 % Apply topically.     Wheat Dextrin (BENEFIBER) POWD in the morning.     neomycin-polymyxin-dexameth (MAXITROL) 0.1 % OINT neomycin 3.5 mg/g-polymyxin B 10,000 unit/g-dexameth 0.1 % eye oint  APPLY SMALL AMOUNT ONTO SUTURES OR OPERATIVE SITE TWICE DAILY FOR 3 DAYS THEN STOP (Patient not taking: Reported on 02/08/2021)     nitroGLYCERIN (NITROSTAT) 0.4 MG SL tablet Place 1 tablet (0.4 mg total) under the tongue every 5 (five) minutes as needed for chest pain. Reported on 11/01/2015 (Patient not taking: Reported on 02/08/2021) 25 tablet 1   phenazopyridine (PYRIDIUM) 100 MG tablet 2 tablets after meals     rifaximin (XIFAXAN) 550 MG TABS tablet Xifaxan 550 mg tablet (Patient not taking: Reported on 02/08/2021)     saccharomyces boulardii (FLORASTOR) 250 MG capsule 1 capsule     traZODone (DESYREL) 50 MG tablet TAKE 1/2 TO 1 TABLET BY MOUTH EVERY NIGHT AT BEDTIME (Patient not taking: Reported on 02/08/2021) 30 tablet 5   No current facility-administered medications for this visit.     Past Medical History:  Diagnosis Date   CAD (coronary artery disease)    previous stent to LAD 2003   Diabetes mellitus without complication ([place])    No longer taking diabetic medications at this time   History of complete heart block    History of kidney stones    Hyperlipidemia  Hypertension    LBBB (left bundle branch block)    Melanoma ([facility])    Myasthenia gravis ([facility])    eyelids, finger, and toes   NSVT (nonsustained ventricular tachycardia) ([facility])    OSA (obstructive sleep apnea)    Does not wear CPAP.   Presence of permanent cardiac pacemaker    S/P cardiac pacemaker procedure, placement 10/24/14 Medtronic Adapta L model ADDRL 1  10/25/2014   Spinal stenosis    Severe    Past Surgical History:  Procedure Laterality Date   COLONOSCOPY     CORONARY ANGIOPLASTY WITH STENT PLACEMENT  03/01/2002   stenting to LAD - Dr. Glade Lloyd   CYSTOSCOPY/URETEROSCOPY/HOLMIUM  LASER/STENT PLACEMENT Left 08/31/2019   Procedure: CYSTOSCOPY/RETROGRADE/STENT PLACEMENT;  Surgeon: Ceasar Mons, MD;  Location: [facility];  Service: Urology;  Laterality: Left;  ONY NEEDS 45 MIN   NM MYOCAR PERF WALL MOTION  11/27/2010   normal   PERMANENT PACEMAKER INSERTION N/A 10/24/2014   Procedure: PERMANENT PACEMAKER INSERTION;  Surgeon: [name], MD;  Location: [facility] CATH LAB;  Service: Cardiovascular;  Laterality: N/A;   TONSILLECTOMY     TRANSURETHRAL RESECTION OF PROSTATE N/A 07/06/2019   Procedure: TRANSURETHRAL RESECTION OF THE PROSTATE (TURP), BIPOLAR;  Surgeon: Ceasar Mons, MD;  Location: [facility];  Service: Urology;  Laterality: N/A;   US ECHOCARDIOGRAPHY  12/05/2010   proximal septal thickening,mild concentric LVH,mild deptal hypokinesis,RV mildly dilated,LA mildly dilated,mild AI,moderate aortic root dilatation,septal motion c/w conduction abnormality    Social History   Socioeconomic History   Marital status: Married    Spouse name: Not on file   Number of children: Not on file   Years of education: Not on file   Highest education level: Not on file  Occupational History   Not on file  Tobacco Use   Smoking status: Former    Types: Cigarettes, Cigars    Start date: 04/28/1949    Quit date: 10/27/1982    Years since quitting: 38.3   Smokeless tobacco: Never  Vaping Use   Vaping Use: Never used  Substance and Sexual Activity   Alcohol use: Not Currently    Comment: 1-2 drink per week   Drug use: No   Sexual activity: Not on file  Other Topics Concern   Not on file  Social History Narrative   Not on file   Social Determinants of Health   Financial Resource Strain: Not on file  Food Insecurity: Not on file  Transportation Needs: Not on file  Physical Activity: Not on file  Stress: Not on file  Social Connections: Not on file  Intimate Partner Violence: Not on file    Family History  Problem  Relation Age of Onset   Breast cancer Mother    Colon cancer Father    Lung cancer Father     ROS: Urinary frequency and hesitancy but no fevers or chills, productive cough, hemoptysis, dysphasia, odynophagia, melena, hematochezia, dysuria, hematuria, rash, seizure activity, orthopnea, PND, claudication. Remaining systems are negative.  Physical Exam: Well-developed well-nourished in no acute distress.  Skin is warm and dry.  HEENT is normal.  Neck is supple.  Chest is clear to auscultation with normal expansion.  Cardiovascular exam is regular rate and rhythm.  Abdominal exam nontender or distended. No masses palpated. Extremities show 1-2+ ankle edema. neuro grossly intact  A/P  1 coronary artery disease-patient doing well with no chest pain.  Plan to continue medical therapy with aspirin and statin.  2 history of pacemaker-followed by Dr. Rayann Heman.  3 hyperlipidemia-continue statin.  4 hypertension-blood pressure mildly elevated but he follows this at home and it is typically controlled.  Continue to follow.  5 lower extremity edema-patient notes increased edema.  We will repeat echocardiogram to reassess LV function.  We discussed compression hose and keeping his feet elevated but he has difficulty with compression hose.  We also discussed low-dose diuretic but he has problems with urinary frequency and hesitancy which she feels would exacerbate.  We can consider in the future if needed.  Kirk Ruths, MD

## 2021-02-05 DIAGNOSIS — L570 Actinic keratosis: Secondary | ICD-10-CM | POA: Diagnosis not present

## 2021-02-05 DIAGNOSIS — Z85828 Personal history of other malignant neoplasm of skin: Secondary | ICD-10-CM | POA: Diagnosis not present

## 2021-02-05 DIAGNOSIS — D692 Other nonthrombocytopenic purpura: Secondary | ICD-10-CM | POA: Diagnosis not present

## 2021-02-05 DIAGNOSIS — L821 Other seborrheic keratosis: Secondary | ICD-10-CM | POA: Diagnosis not present

## 2021-02-08 ENCOUNTER — Ambulatory Visit: Payer: Medicare Other | Admitting: Cardiology

## 2021-02-08 ENCOUNTER — Encounter: Payer: Self-pay | Admitting: Cardiology

## 2021-02-08 ENCOUNTER — Other Ambulatory Visit: Payer: Self-pay

## 2021-02-08 VITALS — BP 150/84 | HR 76 | Ht 69.0 in | Wt 167.2 lb

## 2021-02-08 DIAGNOSIS — I2510 Atherosclerotic heart disease of native coronary artery without angina pectoris: Secondary | ICD-10-CM

## 2021-02-08 DIAGNOSIS — R0602 Shortness of breath: Secondary | ICD-10-CM | POA: Diagnosis not present

## 2021-02-08 DIAGNOSIS — I10 Essential (primary) hypertension: Secondary | ICD-10-CM | POA: Diagnosis not present

## 2021-02-08 DIAGNOSIS — Z950 Presence of cardiac pacemaker: Secondary | ICD-10-CM | POA: Diagnosis not present

## 2021-02-08 NOTE — Patient Instructions (Signed)

## 2021-02-11 ENCOUNTER — Telehealth: Payer: Self-pay | Admitting: Cardiology

## 2021-02-11 ENCOUNTER — Other Ambulatory Visit: Payer: Self-pay | Admitting: *Deleted

## 2021-02-11 ENCOUNTER — Other Ambulatory Visit: Payer: Self-pay

## 2021-02-11 DIAGNOSIS — R609 Edema, unspecified: Secondary | ICD-10-CM

## 2021-02-11 MED ORDER — FUROSEMIDE 20 MG PO TABS: 20.0000 mg | ORAL_TABLET | Freq: Every day | ORAL | 3 refills | Status: DC

## 2021-02-11 NOTE — Telephone Encounter (Signed)
Called patient back.  Advised of medication and need for blood work.  Med sent to pharmacy, BMET ordered.  Patient verbalized understanding.

## 2021-02-11 NOTE — Telephone Encounter (Signed)
Pt is calling to let the doctor know that he has changed his mind on the medication they discussed and would like to take it

## 2021-02-11 NOTE — Telephone Encounter (Signed)
Called patient, per last message- discussed starting low dose diuretic- just wanted to clarify which medication so I can get it sent to the pharmacy if MD approves.   Will route to MD.  Thanks!

## 2021-02-14 ENCOUNTER — Telehealth: Payer: Self-pay | Admitting: Neurology

## 2021-02-14 MED ORDER — TRAZODONE HCL 50 MG PO TABS: 25.0000 mg | ORAL_TABLET | Freq: Every evening | ORAL | 5 refills | Status: DC | PRN

## 2021-02-14 NOTE — Telephone Encounter (Signed)
Per Dr. Felecia Shelling- Ok to take lasix. He is ok to call in trazodone 50mg , directions: 1/2-1 tablet po qhs

## 2021-02-14 NOTE — Telephone Encounter (Signed)
Pt has been recently put on  furosemide (LASIX) 20 MG tablet he would like to know if Dr Felecia Shelling feels this will cause problems with his Myasthenia gravis.  Also pt is asking to be put back on his sleep medication, he does not recall the name of it.  Please call

## 2021-02-14 NOTE — Telephone Encounter (Signed)
Called the pt back and advised that Dr Felecia Shelling is ok with restarting trazodone and advised of the directions. Advised the patient lasix is ok to take.  Pt verbalized understanding and was appreciative for the call back. Pt had no questions at this time but was encouraged to call back if questions arise.

## 2021-02-21 DIAGNOSIS — Z950 Presence of cardiac pacemaker: Secondary | ICD-10-CM | POA: Diagnosis not present

## 2021-02-21 DIAGNOSIS — R531 Weakness: Secondary | ICD-10-CM | POA: Diagnosis not present

## 2021-02-21 DIAGNOSIS — I2510 Atherosclerotic heart disease of native coronary artery without angina pectoris: Secondary | ICD-10-CM | POA: Diagnosis not present

## 2021-02-21 DIAGNOSIS — G7000 Myasthenia gravis without (acute) exacerbation: Secondary | ICD-10-CM | POA: Diagnosis not present

## 2021-02-22 DIAGNOSIS — R609 Edema, unspecified: Secondary | ICD-10-CM | POA: Diagnosis not present

## 2021-02-23 LAB — BASIC METABOLIC PANEL
BUN/Creatinine Ratio: 24 (ref 10–24)
BUN: 13 mg/dL (ref 8–27)
CO2: 30 mmol/L — ABNORMAL HIGH (ref 20–29)
Calcium: 8.5 mg/dL — ABNORMAL LOW (ref 8.6–10.2)
Chloride: 84 mmol/L — ABNORMAL LOW (ref 96–106)
Creatinine, Ser: 0.55 mg/dL — ABNORMAL LOW (ref 0.76–1.27)
Glucose: 82 mg/dL (ref 65–99)
Potassium: 4.1 mmol/L (ref 3.5–5.2)
Sodium: 128 mmol/L — ABNORMAL LOW (ref 134–144)
eGFR: 98 mL/min/{1.73_m2} (ref >59)

## 2021-02-26 ENCOUNTER — Ambulatory Visit (HOSPITAL_COMMUNITY): Payer: Medicare Other | Attending: Cardiology

## 2021-02-26 ENCOUNTER — Other Ambulatory Visit: Payer: Self-pay

## 2021-02-26 DIAGNOSIS — R0602 Shortness of breath: Secondary | ICD-10-CM | POA: Insufficient documentation

## 2021-02-26 DIAGNOSIS — Z79899 Other long term (current) drug therapy: Secondary | ICD-10-CM

## 2021-02-26 LAB — ECHOCARDIOGRAM COMPLETE
Area-P 1/2: 2.91 cm2
P 1/2 time: 370 msec
S' Lateral: 4.9 cm

## 2021-02-26 MED ORDER — FUROSEMIDE 20 MG PO TABS: 20.0000 mg | ORAL_TABLET | ORAL | 1 refills | Status: DC

## 2021-02-28 ENCOUNTER — Encounter: Payer: Self-pay | Admitting: *Deleted

## 2021-02-28 NOTE — Telephone Encounter (Signed)
-----   Message from Lelon Perla, MD sent at 02/27/2021 11:11 AM EDT ----- Pt with new cardiomyopathy and moderate to severe AI; also dilated aortic root; begin entresto 24/26 BID; bmet one week; needs fu with me or APP soon for med management; will also likely need CTA of thoracic aorta and ischemia eval Kirk Ruths '

## 2021-02-28 NOTE — Telephone Encounter (Signed)
This encounter was created in error - please disregard.

## 2021-03-01 ENCOUNTER — Telehealth: Payer: Self-pay | Admitting: Cardiology

## 2021-03-01 DIAGNOSIS — I712 Thoracic aortic aneurysm, without rupture, unspecified: Secondary | ICD-10-CM

## 2021-03-01 MED ORDER — ENTRESTO 24-26 MG PO TABS: 1.0000 | ORAL_TABLET | Freq: Two times a day (BID) | ORAL | 3 refills | Status: DC

## 2021-03-01 NOTE — Telephone Encounter (Signed)
Pt is returning a call  

## 2021-03-01 NOTE — Telephone Encounter (Signed)
New message:    Patient wife calling and would like for a nurse to call her concering her husbands results. Patient did not understand instructions.

## 2021-03-01 NOTE — Telephone Encounter (Signed)
Pt is returning a call in regards to results

## 2021-03-01 NOTE — Telephone Encounter (Signed)
Left message for pt to call.

## 2021-03-01 NOTE — Telephone Encounter (Signed)
Lelon Perla, MD  02/23/2021  6:29 PM EDT      Change lasix to 20 mg qod; bmet one week David [name]   Lelon Perla, MD  02/27/2021 11:11 AM EDT      Pt with new cardiomyopathy and moderate to severe AI; also dilated aortic root; begin entresto 24/26 BID; bmet one week; needs fu with me or APP soon for medmanagement; will also likely need CTA of thoracic aorta and ischemia eval David [name]  The patient and his wife has been notified of the result and verbalized understanding.  All questions (if any) were answered. Antonieta Iba, RN 03/01/2021 9:21 AM   Prescription for entresto has been sent in. BMET and Lasix were already ordered. Patient is aware to come in one week for lab work. CTA has been ordered.  Patient prefers to see Dr. Stanford Breed rather than an APP.  Will route to Dr. Jacalyn Lefevre nurse to see if she can find patient an appointment.

## 2021-03-01 NOTE — Telephone Encounter (Signed)
Spoke with pt wife, Follow up scheduled  

## 2021-03-05 DIAGNOSIS — M6281 Muscle weakness (generalized): Secondary | ICD-10-CM | POA: Diagnosis not present

## 2021-03-05 DIAGNOSIS — R2681 Unsteadiness on feet: Secondary | ICD-10-CM | POA: Diagnosis not present

## 2021-03-05 DIAGNOSIS — R2689 Other abnormalities of gait and mobility: Secondary | ICD-10-CM | POA: Diagnosis not present

## 2021-03-06 ENCOUNTER — Inpatient Hospital Stay (HOSPITAL_COMMUNITY)
Admission: AD | Admit: 2021-03-06 | Discharge: 2021-03-11 | DRG: 291 | Disposition: A | Discharge status: Home / Self Care | Payer: Medicare Other | Source: Ambulatory Visit | Attending: Cardiology | Admitting: Cardiology

## 2021-03-06 ENCOUNTER — Other Ambulatory Visit: Payer: Self-pay

## 2021-03-06 ENCOUNTER — Encounter: Payer: Self-pay | Admitting: Cardiology

## 2021-03-06 ENCOUNTER — Ambulatory Visit: Payer: Medicare Other | Admitting: Cardiology

## 2021-03-06 VITALS — BP 130/62 | HR 73 | Ht 69.0 in | Wt 172.1 lb

## 2021-03-06 DIAGNOSIS — I082 Rheumatic disorders of both aortic and tricuspid valves: Secondary | ICD-10-CM | POA: Diagnosis present

## 2021-03-06 DIAGNOSIS — I5021 Acute systolic (congestive) heart failure: Secondary | ICD-10-CM

## 2021-03-06 DIAGNOSIS — I712 Thoracic aortic aneurysm, without rupture, unspecified: Secondary | ICD-10-CM

## 2021-03-06 DIAGNOSIS — N179 Acute kidney failure, unspecified: Secondary | ICD-10-CM | POA: Diagnosis not present

## 2021-03-06 DIAGNOSIS — G7000 Myasthenia gravis without (acute) exacerbation: Secondary | ICD-10-CM | POA: Diagnosis present

## 2021-03-06 DIAGNOSIS — I429 Cardiomyopathy, unspecified: Secondary | ICD-10-CM | POA: Diagnosis not present

## 2021-03-06 DIAGNOSIS — Z85820 Personal history of malignant melanoma of skin: Secondary | ICD-10-CM

## 2021-03-06 DIAGNOSIS — R195 Other fecal abnormalities: Secondary | ICD-10-CM | POA: Diagnosis present

## 2021-03-06 DIAGNOSIS — G4733 Obstructive sleep apnea (adult) (pediatric): Secondary | ICD-10-CM | POA: Diagnosis present

## 2021-03-06 DIAGNOSIS — I5023 Acute on chronic systolic (congestive) heart failure: Secondary | ICD-10-CM | POA: Diagnosis present

## 2021-03-06 DIAGNOSIS — Z7982 Long term (current) use of aspirin: Secondary | ICD-10-CM | POA: Diagnosis not present

## 2021-03-06 DIAGNOSIS — I77810 Thoracic aortic ectasia: Secondary | ICD-10-CM | POA: Diagnosis not present

## 2021-03-06 DIAGNOSIS — I351 Nonrheumatic aortic (valve) insufficiency: Secondary | ICD-10-CM | POA: Diagnosis not present

## 2021-03-06 DIAGNOSIS — K921 Melena: Secondary | ICD-10-CM | POA: Diagnosis not present

## 2021-03-06 DIAGNOSIS — Z801 Family history of malignant neoplasm of trachea, bronchus and lung: Secondary | ICD-10-CM

## 2021-03-06 DIAGNOSIS — Z79899 Other long term (current) drug therapy: Secondary | ICD-10-CM

## 2021-03-06 DIAGNOSIS — R338 Other retention of urine: Secondary | ICD-10-CM | POA: Diagnosis not present

## 2021-03-06 DIAGNOSIS — J9811 Atelectasis: Secondary | ICD-10-CM | POA: Diagnosis present

## 2021-03-06 DIAGNOSIS — R531 Weakness: Secondary | ICD-10-CM | POA: Diagnosis not present

## 2021-03-06 DIAGNOSIS — I5043 Acute on chronic combined systolic (congestive) and diastolic (congestive) heart failure: Secondary | ICD-10-CM | POA: Diagnosis not present

## 2021-03-06 DIAGNOSIS — Z888 Allergy status to other drugs, medicaments and biological substances status: Secondary | ICD-10-CM | POA: Diagnosis not present

## 2021-03-06 DIAGNOSIS — Z803 Family history of malignant neoplasm of breast: Secondary | ICD-10-CM

## 2021-03-06 DIAGNOSIS — E876 Hypokalemia: Secondary | ICD-10-CM | POA: Diagnosis not present

## 2021-03-06 DIAGNOSIS — E871 Hypo-osmolality and hyponatremia: Secondary | ICD-10-CM | POA: Diagnosis present

## 2021-03-06 DIAGNOSIS — Z87442 Personal history of urinary calculi: Secondary | ICD-10-CM

## 2021-03-06 DIAGNOSIS — Z950 Presence of cardiac pacemaker: Secondary | ICD-10-CM | POA: Diagnosis not present

## 2021-03-06 DIAGNOSIS — Z955 Presence of coronary angioplasty implant and graft: Secondary | ICD-10-CM | POA: Diagnosis not present

## 2021-03-06 DIAGNOSIS — N281 Cyst of kidney, acquired: Secondary | ICD-10-CM | POA: Diagnosis not present

## 2021-03-06 DIAGNOSIS — I10 Essential (primary) hypertension: Secondary | ICD-10-CM | POA: Diagnosis present

## 2021-03-06 DIAGNOSIS — D500 Iron deficiency anemia secondary to blood loss (chronic): Secondary | ICD-10-CM | POA: Diagnosis not present

## 2021-03-06 DIAGNOSIS — Z881 Allergy status to other antibiotic agents status: Secondary | ICD-10-CM

## 2021-03-06 DIAGNOSIS — E785 Hyperlipidemia, unspecified: Secondary | ICD-10-CM | POA: Diagnosis not present

## 2021-03-06 DIAGNOSIS — I509 Heart failure, unspecified: Secondary | ICD-10-CM | POA: Diagnosis not present

## 2021-03-06 DIAGNOSIS — I2510 Atherosclerotic heart disease of native coronary artery without angina pectoris: Secondary | ICD-10-CM | POA: Diagnosis not present

## 2021-03-06 DIAGNOSIS — D649 Anemia, unspecified: Secondary | ICD-10-CM | POA: Diagnosis not present

## 2021-03-06 DIAGNOSIS — I110 Hypertensive heart disease with heart failure: Principal | ICD-10-CM | POA: Diagnosis present

## 2021-03-06 DIAGNOSIS — Z20822 Contact with and (suspected) exposure to covid-19: Secondary | ICD-10-CM | POA: Diagnosis present

## 2021-03-06 DIAGNOSIS — D62 Acute posthemorrhagic anemia: Secondary | ICD-10-CM | POA: Diagnosis present

## 2021-03-06 DIAGNOSIS — Z800 Family history of malignant neoplasm of digestive organs: Secondary | ICD-10-CM

## 2021-03-06 DIAGNOSIS — Z87891 Personal history of nicotine dependence: Secondary | ICD-10-CM

## 2021-03-06 DIAGNOSIS — N401 Enlarged prostate with lower urinary tract symptoms: Secondary | ICD-10-CM | POA: Diagnosis not present

## 2021-03-06 DIAGNOSIS — I959 Hypotension, unspecified: Secondary | ICD-10-CM | POA: Diagnosis not present

## 2021-03-06 DIAGNOSIS — I442 Atrioventricular block, complete: Secondary | ICD-10-CM | POA: Diagnosis not present

## 2021-03-06 DIAGNOSIS — I071 Rheumatic tricuspid insufficiency: Secondary | ICD-10-CM

## 2021-03-06 LAB — COMPREHENSIVE METABOLIC PANEL
ALT: 25 U/L (ref 0–44)
AST: 33 U/L (ref 15–41)
Albumin: 3.3 g/dL — ABNORMAL LOW (ref 3.5–5.0)
Alkaline Phosphatase: 56 U/L (ref 38–126)
Anion gap: 4 — ABNORMAL LOW (ref 5–15)
BUN: 17 mg/dL (ref 8–23)
CO2: 32 mmol/L (ref 22–32)
Calcium: 8.5 mg/dL — ABNORMAL LOW (ref 8.9–10.3)
Chloride: 99 mmol/L (ref 98–111)
Creatinine, Ser: 0.52 mg/dL — ABNORMAL LOW (ref 0.61–1.24)
GFR, Estimated: >60 mL/min (ref >60)
Glucose, Bld: 95 mg/dL (ref 70–99)
Potassium: 4.2 mmol/L (ref 3.5–5.1)
Sodium: 135 mmol/L (ref 135–145)
Total Bilirubin: 1 mg/dL (ref 0.3–1.2)
Total Protein: 5.4 g/dL — ABNORMAL LOW (ref 6.5–8.1)

## 2021-03-06 LAB — CBC
HCT: 30.8 % — ABNORMAL LOW (ref 39.0–52.0)
Hemoglobin: 9.4 g/dL — ABNORMAL LOW (ref 13.0–17.0)
MCH: 27.3 pg (ref 26.0–34.0)
MCHC: 30.5 g/dL (ref 30.0–36.0)
MCV: 89.5 fL (ref 80.0–100.0)
Platelets: 185 10*3/uL (ref 150–400)
RBC: 3.44 MIL/uL — ABNORMAL LOW (ref 4.22–5.81)
RDW: 15.4 % (ref 11.5–15.5)
WBC: 4.8 10*3/uL (ref 4.0–10.5)
nRBC: 0 % (ref 0.0–0.2)

## 2021-03-06 LAB — IRON AND TIBC
Iron: 15 ug/dL — ABNORMAL LOW (ref 45–182)
Saturation Ratios: 3 % — ABNORMAL LOW (ref 17.9–39.5)
TIBC: 469 ug/dL — ABNORMAL HIGH (ref 250–450)
UIBC: 454 ug/dL

## 2021-03-06 LAB — BRAIN NATRIURETIC PEPTIDE: B Natriuretic Peptide: 1466.8 pg/mL — ABNORMAL HIGH (ref 0.0–100.0)

## 2021-03-06 MED ORDER — SPIRONOLACTONE 25 MG PO TABS
25.0000 mg | ORAL_TABLET | Freq: Every day | ORAL | Status: DC
  Administered 2021-03-06 – 2021-03-11 (×6): 25 mg via ORAL
  Filled 2021-03-06 (×6): qty 1

## 2021-03-06 MED ORDER — SACUBITRIL-VALSARTAN 24-26 MG PO TABS
1.0000 | ORAL_TABLET | Freq: Two times a day (BID) | ORAL | Status: DC
  Administered 2021-03-06 – 2021-03-11 (×10): 1 via ORAL
  Filled 2021-03-06 (×10): qty 1

## 2021-03-06 MED ORDER — ASPIRIN EC 81 MG PO TBEC
81.0000 mg | DELAYED_RELEASE_TABLET | Freq: Every day | ORAL | Status: DC
  Administered 2021-03-06 – 2021-03-07 (×2): 81 mg via ORAL
  Filled 2021-03-06 (×3): qty 1

## 2021-03-06 MED ORDER — LORATADINE 10 MG PO TABS
10.0000 mg | ORAL_TABLET | Freq: Every day | ORAL | Status: DC
  Administered 2021-03-07 – 2021-03-11 (×5): 10 mg via ORAL
  Filled 2021-03-06 (×5): qty 1

## 2021-03-06 MED ORDER — SODIUM CHLORIDE 0.9 % IV SOLN: 250.0000 mL | INTRAVENOUS | Status: DC | PRN

## 2021-03-06 MED ORDER — SODIUM CHLORIDE 0.9% FLUSH: 3.0000 mL | INTRAVENOUS | Status: DC | PRN

## 2021-03-06 MED ORDER — EZETIMIBE 10 MG PO TABS
10.0000 mg | ORAL_TABLET | Freq: Every day | ORAL | Status: DC
  Administered 2021-03-06 – 2021-03-11 (×6): 10 mg via ORAL
  Filled 2021-03-06 (×6): qty 1

## 2021-03-06 MED ORDER — SIMVASTATIN 20 MG PO TABS
40.0000 mg | ORAL_TABLET | Freq: Every day | ORAL | Status: DC
  Administered 2021-03-06 – 2021-03-11 (×6): 40 mg via ORAL
  Filled 2021-03-06 (×6): qty 2

## 2021-03-06 MED ORDER — DESMOPRESSIN ACETATE 0.1 MG PO TABS
0.1000 mg | ORAL_TABLET | Freq: Every day | ORAL | Status: DC
  Administered 2021-03-06 – 2021-03-10 (×5): 0.1 mg via ORAL
  Filled 2021-03-06 (×6): qty 1

## 2021-03-06 MED ORDER — FUROSEMIDE 10 MG/ML IJ SOLN
40.0000 mg | Freq: Two times a day (BID) | INTRAMUSCULAR | Status: DC
  Administered 2021-03-06 – 2021-03-07 (×2): 40 mg via INTRAVENOUS
  Filled 2021-03-06 (×2): qty 4

## 2021-03-06 MED ORDER — POLYVINYL ALCOHOL 1.4 % OP SOLN
1.0000 [drp] | Freq: Every day | OPHTHALMIC | Status: DC | PRN
  Filled 2021-03-06: qty 15

## 2021-03-06 MED ORDER — PYRIDOSTIGMINE BROMIDE 60 MG PO TABS
120.0000 mg | ORAL_TABLET | Freq: Four times a day (QID) | ORAL | Status: DC
  Administered 2021-03-06 – 2021-03-11 (×19): 120 mg via ORAL
  Filled 2021-03-06 (×21): qty 2

## 2021-03-06 MED ORDER — ONDANSETRON HCL 4 MG/2ML IJ SOLN: 4.0000 mg | Freq: Four times a day (QID) | INTRAMUSCULAR | Status: DC | PRN

## 2021-03-06 MED ORDER — TRAZODONE HCL 50 MG PO TABS
25.0000 mg | ORAL_TABLET | Freq: Every evening | ORAL | Status: DC | PRN
  Administered 2021-03-06 – 2021-03-09 (×3): 25 mg via ORAL
  Filled 2021-03-06 (×3): qty 1

## 2021-03-06 MED ORDER — EMPAGLIFLOZIN 10 MG PO TABS
10.0000 mg | ORAL_TABLET | Freq: Every day | ORAL | Status: DC
  Administered 2021-03-07 – 2021-03-11 (×5): 10 mg via ORAL
  Filled 2021-03-06 (×5): qty 1

## 2021-03-06 MED ORDER — POTASSIUM CHLORIDE CRYS ER 20 MEQ PO TBCR
20.0000 meq | EXTENDED_RELEASE_TABLET | Freq: Every day | ORAL | Status: DC
  Administered 2021-03-07: 20 meq via ORAL
  Filled 2021-03-06: qty 1

## 2021-03-06 MED ORDER — EZETIMIBE-SIMVASTATIN 10-40 MG PO TABS
1.0000 | ORAL_TABLET | Freq: Every day | ORAL | Status: DC
  Filled 2021-03-06: qty 1

## 2021-03-06 MED ORDER — MYCOPHENOLATE MOFETIL 250 MG PO CAPS
500.0000 mg | ORAL_CAPSULE | Freq: Two times a day (BID) | ORAL | Status: DC
  Administered 2021-03-06 – 2021-03-11 (×10): 500 mg via ORAL
  Filled 2021-03-06 (×11): qty 2

## 2021-03-06 MED ORDER — ENOXAPARIN SODIUM 40 MG/0.4ML IJ SOSY
40.0000 mg | PREFILLED_SYRINGE | INTRAMUSCULAR | Status: DC
  Administered 2021-03-06: 40 mg via SUBCUTANEOUS
  Filled 2021-03-06: qty 0.4

## 2021-03-06 MED ORDER — CALCIUM POLYCARBOPHIL 625 MG PO TABS
625.0000 mg | ORAL_TABLET | Freq: Every day | ORAL | Status: DC
  Administered 2021-03-06 – 2021-03-11 (×6): 625 mg via ORAL
  Filled 2021-03-06 (×6): qty 1

## 2021-03-06 MED ORDER — ACETAMINOPHEN 325 MG PO TABS: 650.0000 mg | ORAL_TABLET | ORAL | Status: DC | PRN

## 2021-03-06 MED ORDER — SODIUM CHLORIDE 0.9% FLUSH
3.0000 mL | Freq: Two times a day (BID) | INTRAVENOUS | Status: DC
  Administered 2021-03-06 – 2021-03-11 (×9): 3 mL via INTRAVENOUS

## 2021-03-06 MED ORDER — SACCHAROMYCES BOULARDII 250 MG PO CAPS
250.0000 mg | ORAL_CAPSULE | Freq: Two times a day (BID) | ORAL | Status: DC
  Administered 2021-03-06 – 2021-03-11 (×10): 250 mg via ORAL
  Filled 2021-03-06 (×10): qty 1

## 2021-03-06 MED ORDER — MIRABEGRON ER 50 MG PO TB24
50.0000 mg | ORAL_TABLET | Freq: Every day | ORAL | Status: DC
  Administered 2021-03-06 – 2021-03-11 (×6): 50 mg via ORAL
  Filled 2021-03-06 (×6): qty 1

## 2021-03-06 NOTE — Progress Notes (Signed)
HPI: Follow-up coronary artery disease, hypertension and history of complete heart block status post pacemaker.  Patient is followed by Dr. Rayann Heman.  Patient had PCI of his LAD in 2003.  He had a pacemaker placed in 2016.  Echocardiogram March 2016 showed normal LV function, moderate left ventricular hypertrophy, grade 1 diastolic dysfunction, mild aortic insufficiency, mildly dilated aortic root, moderate tricuspid regurgitation.  Nuclear study September 2016 showed ejection fraction 43%, inferior thinning but no ischemia.  At last office visit patient noted increased lower extremity edema and diuretic was added.  Echocardiogram repeated in August 2022 and showed ejection fraction 20 to 25%, mild left ventricular enlargement, mild left ventricular hypertrophy, grade 1 diastolic dysfunction, mild right ventricular enlargement, moderate left atrial enlargement, severe right atrial enlargement, mild mitral regurgitation, moderate tricuspid regurgitation, moderate to severe aortic insufficiency, dilated ascending aorta measuring 45 mm.  Since last seen patient describes dyspnea on exertion.  He had an episode of orthopnea as well.  His lower extremity edema is the same to worse.  He denies chest pain or syncope.  Current Outpatient Medications  Medication Sig Dispense Refill   aspirin EC 81 MG tablet Take 81 mg by mouth daily.      desmopressin (DDAVP) 0.1 MG tablet TAKE 1 TABLET(0.1 MG) BY MOUTH AT BEDTIME 30 tablet 5   doxycycline (VIBRA-TABS) 100 MG tablet Take 100 mg by mouth 2 (two) times daily.     Ensure (ENSURE) See admin instructions.     ezetimibe-simvastatin (VYTORIN) 10-40 MG tablet Take 1 tablet by mouth daily.     furosemide (LASIX) 20 MG tablet Take 1 tablet (20 mg total) by mouth every other day. 90 tablet 1   loratadine (CLARITIN) 10 MG tablet Take 10 mg by mouth daily.     mycophenolate (CELLCEPT) 250 MG capsule TAKE 2 CAPSULES BY MOUTH TWICE DAILY 120 capsule 5   MYRBETRIQ 50 MG  TB24 tablet Take 50 mg by mouth daily.     neomycin-polymyxin-dexameth (MAXITROL) 0.1 % OINT      nitroGLYCERIN (NITROSTAT) 0.4 MG SL tablet Place 1 tablet (0.4 mg total) under the tongue every 5 (five) minutes as needed for chest pain. Reported on 11/01/2015 25 tablet 1   phenazopyridine (PYRIDIUM) 100 MG tablet 2 tablets after meals     Polyethyl Glycol-Propyl Glycol (SYSTANE) 0.4-0.3 % SOLN See admin instructions.     pyridostigmine (MESTINON) 60 MG tablet Take up to 5-9 pills per day 780 tablet 3   rifaximin (XIFAXAN) 550 MG TABS tablet      saccharomyces boulardii (FLORASTOR) 250 MG capsule 1 capsule     sacubitril-valsartan (ENTRESTO) 24-26 MG Take 1 tablet by mouth 2 (two) times daily. 60 tablet 3   sildenafil (REVATIO) 20 MG tablet One po prn for ED 1/2 hour before sexual relations.   Do not take nitroglycerin the same day 6 tablet 3   tamsulosin (FLOMAX) 0.4 MG CAPS capsule Take 1 capsule (0.4 mg total) by mouth as needed. 30 capsule 6   traZODone (DESYREL) 50 MG tablet Take 0.5-1 tablets (25-50 mg total) by mouth at bedtime as needed for sleep. 30 tablet 5   triamcinolone (KENALOG) 0.1 % 1 application     triamcinolone ointment (KENALOG) 0.1 % Apply topically.     Wheat Dextrin (BENEFIBER) POWD in the morning.     No current facility-administered medications for this visit.     Past Medical History:  Diagnosis Date   CAD (coronary artery disease)  previous stent to LAD 2003   Diabetes mellitus without complication ([place])    No longer taking diabetic medications at this time   History of complete heart block    History of kidney stones    Hyperlipidemia    Hypertension    LBBB (left bundle branch block)    Melanoma (HCC)    Myasthenia gravis (HCC)    eyelids, finger, and toes   NSVT (nonsustained ventricular tachycardia) (HCC)    OSA (obstructive sleep apnea)    Does not wear CPAP.   Presence of permanent cardiac pacemaker    S/P cardiac pacemaker procedure, placement  10/24/14 Medtronic Adapta L model ADDRL 1  10/25/2014   Spinal stenosis    Severe    Past Surgical History:  Procedure Laterality Date   COLONOSCOPY     CORONARY ANGIOPLASTY WITH STENT PLACEMENT  03/01/2002   stenting to LAD - Dr. Glade Lloyd   CYSTOSCOPY/URETEROSCOPY/HOLMIUM LASER/STENT PLACEMENT Left 08/31/2019   Procedure: CYSTOSCOPY/RETROGRADE/STENT PLACEMENT;  Surgeon: Ceasar Mons, MD;  Location: [facility];  Service: Urology;  Laterality: Left;  ONY NEEDS 45 MIN   NM MYOCAR PERF WALL MOTION  11/27/2010   normal   PERMANENT PACEMAKER INSERTION N/A 10/24/2014   Procedure: PERMANENT PACEMAKER INSERTION;  Surgeon: [name], MD;  Location: [facility] CATH LAB;  Service: Cardiovascular;  Laterality: N/A;   TONSILLECTOMY     TRANSURETHRAL RESECTION OF PROSTATE N/A 07/06/2019   Procedure: TRANSURETHRAL RESECTION OF THE PROSTATE (TURP), BIPOLAR;  Surgeon: Ceasar Mons, MD;  Location: [facility];  Service: Urology;  Laterality: N/A;   US ECHOCARDIOGRAPHY  12/05/2010   proximal septal thickening,mild concentric LVH,mild deptal hypokinesis,RV mildly dilated,LA mildly dilated,mild AI,moderate aortic root dilatation,septal motion c/w conduction abnormality    Social History   Socioeconomic History   Marital status: Married    Spouse name: Not on file   Number of children: Not on file   Years of education: Not on file   Highest education level: Not on file  Occupational History   Not on file  Tobacco Use   Smoking status: Former    Types: Cigarettes, Cigars    Start date: 04/28/1949    Quit date: 10/27/1982    Years since quitting: 38.3   Smokeless tobacco: Never  Vaping Use   Vaping Use: Never used  Substance and Sexual Activity   Alcohol use: Not Currently    Comment: 1-2 drink per week   Drug use: No   Sexual activity: Not on file  Other Topics Concern   Not on file  Social History Narrative   Not on file   Social Determinants of  Health   Financial Resource Strain: Not on file  Food Insecurity: Not on file  Transportation Needs: Not on file  Physical Activity: Not on file  Stress: Not on file  Social Connections: Not on file  Intimate Partner Violence: Not on file    Family History  Problem Relation Age of Onset   Breast cancer Mother    Colon cancer Father    Lung cancer Father     ROS: no fevers or chills, productive cough, hemoptysis, dysphasia, odynophagia, melena, hematochezia, dysuria, hematuria, rash, seizure activity, claudication. Remaining systems are negative.  Physical Exam: Well-developed well-nourished in no acute distress.  Skin is warm and dry.  HEENT is normal.  Normal eyelids. Neck is supple.  No adenopathy.  Jugular venous pulsation to angle of jaw. Chest with mild basilar crackles.  No wheeze.  Cardiovascular exam is regular rate and rhythm.  1/6 systolic and diastolic murmur left sternal border.  No gallop noted. Abdominal exam nontender or distended. No masses palpated.  2+ femoral pulses bilaterally. Extremities show 2+ edema. neuro grossly intact  ECG-atrial since, ventricular paced with PACs.  Personally reviewed  A/P  1 new onset systolic congestive heart failure-patient is significantly volume overloaded.  He is having worsening dyspnea on exertion and also with an episode of orthopnea.  He was short of breath with any movement in the office today.  I will plan to admit for IV diuresis.  Begin Lasix 40 mg IV twice daily.  Add spironolactone 25 mg daily.  Follow renal function closely.  Needs fluid restriction and low-sodium diet.  2 cardiomyopathy-LV function is worse compared to previous.  Etiology unclear.  We will begin Entresto 24/26 twice daily.  Add Jardiance 10 mg daily.  Add digoxin 0.125 mg daily.  Will add carvedilol once CHF improves and as BP allows.  Will need right and left cardiac catheterization hopefully on Friday of this week to exclude coronary disease as a  cause of his worsening LV function.  Patient also with history of left bundle branch block and dyssynchrony may contribute as well.  3 valvular heart disease-echocardiogram shows moderate to severe aortic insufficiency and moderate tricuspid regurgitation.  Question if aortic insufficiency is contributing to his LV dysfunction.  Would plan medical therapy at this point.  Given age and overall medical condition not clear that he would be a candidate for aortic valve replacement in the future if needed.  4 dilated aortic root-can plan CTA in the future to better assess size of aortic root.  5 hypertension-we will continue present medications both for blood pressure and CHF.  Titrate as tolerated.  6 hyperlipidemia-continue statin.  7 status post pacemaker-followed by Dr. Rayann Heman.  8 question recent black stool-check hemoglobin and guaiac.  Kirk Ruths, MD

## 2021-03-06 NOTE — H&P (Signed)
Cardiology Admission History and Physical:   Patient ID: David [name]. MRN: [id number]; DOB: 26-Feb-1936   Admission date: 03/06/2021  PCP:  [name], MD   [place] Providers Cardiologist:  None  Electrophysiologist:  [name], MD       Chief Complaint:  dyspnea on exertion  Patient Profile:   David [name]. is a 85 y.o. male with CAD s/p PCI to LAD 2003, hypertension, hyperlipidemia, aortic insufficiency, tricuspid regurgitation, CHB s/p MDT PM 2016 , Myasthenia Gravis, severe spinal stenosis, hx of LBBB who is being seen 03/06/2021 for the evaluation of newly reduced LVEF. ,  History of Present Illness:   David [name] reports acute on chronic weakness and fatigue to two weeks associated with dyspnea/DOE, weight gain (10lbs in 1 month), worsening lower extremity edema, occasional PND. Patient also reports occasional black stool. Denies fever, chills, dizziness, syncope, lightheadedness, cough, chest discomfort, heart palpitations, nausea, vomiting, early satiety, abdominal fullness, dysuria, diarrhea.  Patient has chronic balance issues so he barely does not activity at home. He uses a cane to walk.   Echocardiogram March 2016 showed normal LV function, moderate left ventricular hypertrophy, grade 1 diastolic dysfunction, mild aortic insufficiency, mildly dilated aortic root, moderate tricuspid regurgitation.  Nuclear study September 2016 showed ejection fraction 43%, inferior thinning but no ischemia.  At last office visit patient noted increased lower extremity edema and diuretic was added.    Echocardiogram repeated in August 2022 and showed ejection fraction 20 to 25%, mild left ventricular enlargement, mild left ventricular hypertrophy, grade 1 diastolic dysfunction, mild right ventricular enlargement, moderate left atrial enlargement, severe right atrial enlargement, mild mitral regurgitation, moderate tricuspid regurgitation, moderate to severe aortic  insufficiency, dilated ascending aorta measuring 45 mm.    PM interrogation 12/14/20 AS-VP 82.7%, AP-VP 9.5%  Past Medical History:  Diagnosis Date   CAD (coronary artery disease)    previous stent to LAD 2003   Diabetes mellitus without complication (HCC)    No longer taking diabetic medications at this time   History of complete heart block    History of kidney stones    Hyperlipidemia    Hypertension    LBBB (left bundle branch block)    Melanoma (HCC)    Myasthenia gravis (HCC)    eyelids, finger, and toes   NSVT (nonsustained ventricular tachycardia) (HCC)    OSA (obstructive sleep apnea)    Does not wear CPAP.   Presence of permanent cardiac pacemaker    S/P cardiac pacemaker procedure, placement 10/24/14 Medtronic Adapta L model ADDRL 1  10/25/2014   Spinal stenosis    Severe    Past Surgical History:  Procedure Laterality Date   COLONOSCOPY     CORONARY ANGIOPLASTY WITH STENT PLACEMENT  03/01/2002   stenting to LAD - Dr. Glade Lloyd   CYSTOSCOPY/URETEROSCOPY/HOLMIUM LASER/STENT PLACEMENT Left 08/31/2019   Procedure: CYSTOSCOPY/RETROGRADE/STENT PLACEMENT;  Surgeon: Ceasar Mons, MD;  Location: [facility];  Service: Urology;  Laterality: Left;  ONY NEEDS 45 MIN   NM MYOCAR PERF WALL MOTION  11/27/2010   normal   PERMANENT PACEMAKER INSERTION N/A 10/24/2014   Procedure: PERMANENT PACEMAKER INSERTION;  Surgeon: [name], MD;  Location: [facility] CATH LAB;  Service: Cardiovascular;  Laterality: N/A;   TONSILLECTOMY     TRANSURETHRAL RESECTION OF PROSTATE N/A 07/06/2019   Procedure: TRANSURETHRAL RESECTION OF THE PROSTATE (TURP), BIPOLAR;  Surgeon: Ceasar Mons, MD;  Location[facility];  Service: Urology;  Laterality: N/A;  US ECHOCARDIOGRAPHY  12/05/2010   proximal septal thickening,mild concentric LVH,mild deptal hypokinesis,RV mildly dilated,LA mildly dilated,mild AI,moderate aortic root dilatation,septal motion c/w conduction  abnormality     Medications Prior to Admission: Prior to Admission medications   Medication Sig Start Date End Date Taking? Authorizing Provider  aspirin EC 81 MG tablet Take 81 mg by mouth daily.     [provider]  desmopressin (DDAVP) 0.1 MG tablet TAKE 1 TABLET(0.1 MG) BY MOUTH AT BEDTIME 12/17/20   Sater, Nanine Means, MD  doxycycline (VIBRA-TABS) 100 MG tablet Take 100 mg by mouth 2 (two) times daily. 02/27/20   [provider]  Ensure (ENSURE) See admin instructions.    [provider]  ezetimibe-simvastatin (VYTORIN) 10-40 MG tablet Take 1 tablet by mouth daily.    [provider]  furosemide (LASIX) 20 MG tablet Take 1 tablet (20 mg total) by mouth every other day. 02/26/21   Lelon Perla, MD  loratadine (CLARITIN) 10 MG tablet Take 10 mg by mouth daily.    [provider]  mycophenolate (CELLCEPT) 250 MG capsule TAKE 2 CAPSULES BY MOUTH TWICE DAILY 11/26/20   Sater, Nanine Means, MD  MYRBETRIQ 50 MG TB24 tablet Take 50 mg by mouth daily. 08/21/20   [provider]  neomycin-polymyxin-dexameth (MAXITROL) 0.1 % OINT     [provider]  nitroGLYCERIN (NITROSTAT) 0.4 MG SL tablet Place 1 tablet (0.4 mg total) under the tongue every 5 (five) minutes as needed for chest pain. Reported on 11/01/2015 11/07/15   [name], MD  phenazopyridine (PYRIDIUM) 100 MG tablet 2 tablets after meals    [provider]  Polyethyl Glycol-Propyl Glycol (SYSTANE) 0.4-0.3 % SOLN See admin instructions.    [provider]  pyridostigmine (MESTINON) 60 MG tablet Take up to 5-9 pills per day 05/22/20   Sater, Nanine Means, MD  rifaximin Doreene Nest) 550 MG TABS tablet     [provider]  saccharomyces boulardii (FLORASTOR) 250 MG capsule 1 capsule    [provider]  sacubitril-valsartan (ENTRESTO) 24-26 MG Take 1 tablet by mouth 2 (two) times daily. 03/01/21   Lelon Perla, MD  sildenafil (REVATIO) 20 MG tablet One po  prn for ED 1/2 hour before sexual relations.   Do not take nitroglycerin the same day 11/20/20   Sater, Nanine Means, MD  tamsulosin (FLOMAX) 0.4 MG CAPS capsule Take 1 capsule (0.4 mg total) by mouth as needed. 01/04/20   Baldwin Jamaica, PA-C  traZODone (DESYREL) 50 MG tablet Take 0.5-1 tablets (25-50 mg total) by mouth at bedtime as needed for sleep. 02/14/21   Sater, Nanine Means, MD  triamcinolone (KENALOG) 0.1 % 1 application    [provider]  triamcinolone ointment (KENALOG) 0.1 % Apply topically. 02/27/20   [provider]  Wheat Dextrin (BENEFIBER) POWD in the morning.    [provider]     Allergies:    Allergies  Allergen Reactions   Azithromycin Other (See Comments)   Clindamycin Hcl Other (See Comments)   Doxycycline Diarrhea   Doxycycline Hyclate Other (See Comments)   Levofloxacin Other (See Comments)   Niacin Other (See Comments)   Gris-Peg [Griseofulvin] Other (See Comments)    headaches   Terbinafine Other (See Comments)    headache    Social History:   Social History   Socioeconomic History   Marital status: Married    Spouse name: Not on file   Number of children: Not on file  Years of education: Not on file   Highest education level: Not on file  Occupational History   Not on file  Tobacco Use   Smoking status: Former    Types: Cigarettes, Cigars    Start date: 04/28/1949    Quit date: 10/27/1982    Years since quitting: 38.3   Smokeless tobacco: Never  Vaping Use   Vaping Use: Never used  Substance and Sexual Activity   Alcohol use: Not Currently    Comment: 1-2 drink per week   Drug use: No   Sexual activity: Not on file  Other Topics Concern   Not on file  Social History Narrative   Not on file   Social Determinants of Health   Financial Resource Strain: Not on file  Food Insecurity: Not on file  Transportation Needs: Not on file  Physical Activity: Not on file  Stress: Not on file  Social Connections: Not on file   Intimate Partner Violence: Not on file    Family History:  The patient's family history includes Breast cancer in his mother; Colon cancer in his father; Lung cancer in his father.    ROS:  Please see the history of present illness.  [name] other ROS reviewed and negative.     Physical Exam/Data:   Vitals:   03/06/21 2012  BP: 96/76  Pulse: 75  Resp: 18  Temp: 97.8 F (36.6 C)  TempSrc: Oral  SpO2: 96%   No intake or output data in the 24 hours ending 03/06/21 2013 Last 3 Weights 03/06/2021 02/08/2021 11/20/2020  Weight (lbs) 172 lb 1.9 oz 167 lb 3.2 oz 149 lb 8 oz  Weight (kg) 78.073 kg 75.841 kg 67.813 kg     There is no height or weight on file to calculate BMI.  General:  Well nourished, well developed, in no acute distress HEENT: normal Lymph: no adenopathy Neck: JVD up to earlobe at 10 degrees Endocrine:  No thryomegaly Vascular: No carotid bruits; FA pulses 2+ bilaterally without bruits  Cardiac:  normal S1, S2; RRR; no murmur  Lungs:  clear to auscultation bilaterally, no wheezing, rhonchi or rales  Abd: soft, nontender, no hepatomegaly  Ext: 3+ pitting edema up to shins bilaterally Musculoskeletal:  No deformities, BLE strength 4/5 and equal Skin: warm and dry  Neuro:  CNs 2-12 intact, no focal abnormalities noted Psych:  Normal affect    EKG:  pending  Relevant CV Studies: TTE 02/26/21  Laboratory Data:  High Sensitivity Troponin:  No results for input(s): TROPONINIHS in the last 720 hours.    ChemistryNo results for input(s): NA, K, CL, CO2, GLUCOSE, BUN, CREATININE, CALCIUM, GFRNONAA, GFRAA, ANIONGAP in the last 168 hours.  No results for input(s): PROT, ALBUMIN, AST, ALT, ALKPHOS, BILITOT in the last 168 hours. HematologyNo results for input(s): WBC, RBC, HGB, HCT, MCV, MCH, MCHC, RDW, PLT in the last 168 hours. BNPNo results for input(s): BNP, PROBNP in the last 168 hours.  DDimer No results for input(s): DDIMER in the last 168  hours.   Radiology/Studies:  No results found.   Assessment and Plan:   # New onset HFrEF -DOE, no angina, volume overload and warm on exam. Reports weight gain and PND -Per pt, dry weight is 150lb -s/p Lasix oral '20mg'$  every other day (home dose) -strict I/Os, Daily weights and UOP, replace electrolytes as needed -iron panel -start lasix '40mg'$  IV BID -GDMT   -continue entresto 24/26 twice daily   -add spironolactone '25mg'$  daily   -add  jardiance '10mg'$  dialy   -consider add carvedilol once CHF improves and as BP allows -optimize volume status, plan R/LHC this Friday  #Aortic insufficiency  #Tricuspid regurgitation -continue CHF treatment -continue afterload reduction with entresto -will need to re-evaluate when volume status improves  #Reports melena -continue follow up Hgb -FOBT   Risk Assessment/Risk Scores:   New York Heart Association (NYHA) Functional Class NYHA Class III   Severity of Illness: The appropriate patient status for this patient is INPATIENT. Inpatient status is judged to be reasonable and necessary in order to provide the required intensity of service to ensure the patient's safety. The patient's presenting symptoms, physical exam findings, and initial radiographic and laboratory data in the context of their chronic comorbidities is felt to place them at high risk for further clinical deterioration. Furthermore, it is not anticipated that the patient will be medically stable for discharge from the hospital within 2 midnights of admission. The following factors support the patient status of inpatient.   " The patient's presenting symptoms include DOE. " The worrisome physical exam findings include volume overload. " The initial radiographic and laboratory data are worrisome because of hyponatremia " The chronic co-morbidities include HTN, HLD, balance,.   * I certify that at the point of admission it is my clinical judgment that the patient will require  inpatient hospital care spanning beyond 2 midnights from the point of admission due to high intensity of service, high risk for further deterioration and high frequency of surveillance required.*   For questions or updates, please contact [place] Please consult [email or web address] for contact info under     Signed, Laurice Record, MD  03/06/2021 8:13 PM

## 2021-03-07 ENCOUNTER — Other Ambulatory Visit (HOSPITAL_COMMUNITY): Payer: Self-pay

## 2021-03-07 ENCOUNTER — Other Ambulatory Visit (HOSPITAL_COMMUNITY): Payer: Medicare Other

## 2021-03-07 ENCOUNTER — Inpatient Hospital Stay (HOSPITAL_COMMUNITY): Payer: Medicare Other

## 2021-03-07 DIAGNOSIS — I5043 Acute on chronic combined systolic (congestive) and diastolic (congestive) heart failure: Secondary | ICD-10-CM | POA: Diagnosis not present

## 2021-03-07 DIAGNOSIS — D500 Iron deficiency anemia secondary to blood loss (chronic): Secondary | ICD-10-CM

## 2021-03-07 DIAGNOSIS — R338 Other retention of urine: Secondary | ICD-10-CM

## 2021-03-07 DIAGNOSIS — D62 Acute posthemorrhagic anemia: Secondary | ICD-10-CM | POA: Diagnosis present

## 2021-03-07 DIAGNOSIS — N401 Enlarged prostate with lower urinary tract symptoms: Secondary | ICD-10-CM

## 2021-03-07 DIAGNOSIS — G7000 Myasthenia gravis without (acute) exacerbation: Secondary | ICD-10-CM

## 2021-03-07 DIAGNOSIS — I351 Nonrheumatic aortic (valve) insufficiency: Secondary | ICD-10-CM

## 2021-03-07 LAB — URINALYSIS, ROUTINE W REFLEX MICROSCOPIC
Bacteria, UA: NONE SEEN
Bilirubin Urine: NEGATIVE
Glucose, UA: 50 mg/dL — AB
Ketones, ur: NEGATIVE mg/dL
Leukocytes,Ua: NEGATIVE
Nitrite: NEGATIVE
Protein, ur: NEGATIVE mg/dL
RBC / HPF: >50 RBC/hpf — ABNORMAL HIGH (ref 0–5)
Specific Gravity, Urine: 1.005 (ref 1.005–1.030)
pH: 9 — ABNORMAL HIGH (ref 5.0–8.0)

## 2021-03-07 LAB — TYPE AND SCREEN
ABO/RH(D): O POS
Antibody Screen: NEGATIVE

## 2021-03-07 LAB — HEMOGLOBIN AND HEMATOCRIT, BLOOD
HCT: 34.5 % — ABNORMAL LOW (ref 39.0–52.0)
Hemoglobin: 10.4 g/dL — ABNORMAL LOW (ref 13.0–17.0)

## 2021-03-07 LAB — BASIC METABOLIC PANEL
Anion gap: 10 (ref 5–15)
BUN: 15 mg/dL (ref 8–23)
CO2: 32 mmol/L (ref 22–32)
Calcium: 8.7 mg/dL — ABNORMAL LOW (ref 8.9–10.3)
Chloride: 94 mmol/L — ABNORMAL LOW (ref 98–111)
Creatinine, Ser: 0.55 mg/dL — ABNORMAL LOW (ref 0.61–1.24)
GFR, Estimated: >60 mL/min (ref >60)
Glucose, Bld: 114 mg/dL — ABNORMAL HIGH (ref 70–99)
Potassium: 3.2 mmol/L — ABNORMAL LOW (ref 3.5–5.1)
Sodium: 136 mmol/L (ref 135–145)

## 2021-03-07 LAB — OCCULT BLOOD X 1 CARD TO LAB, STOOL: Fecal Occult Bld: POSITIVE — AB

## 2021-03-07 LAB — ABO/RH: ABO/RH(D): O POS

## 2021-03-07 LAB — SARS CORONAVIRUS 2 (TAT 6-24 HRS): SARS Coronavirus 2: NEGATIVE

## 2021-03-07 MED ORDER — POTASSIUM CHLORIDE CRYS ER 20 MEQ PO TBCR
40.0000 meq | EXTENDED_RELEASE_TABLET | ORAL | Status: AC
  Administered 2021-03-07: 40 meq via ORAL
  Filled 2021-03-07: qty 2

## 2021-03-07 MED ORDER — CHLORHEXIDINE GLUCONATE CLOTH 2 % EX PADS
6.0000 | MEDICATED_PAD | Freq: Every day | CUTANEOUS | Status: DC
  Administered 2021-03-07 – 2021-03-09 (×3): 6 via TOPICAL

## 2021-03-07 MED ORDER — PANTOPRAZOLE SODIUM 40 MG IV SOLR: 40.0000 mg | Freq: Once | INTRAVENOUS | Status: DC

## 2021-03-07 MED ORDER — TAMSULOSIN HCL 0.4 MG PO CAPS
0.4000 mg | ORAL_CAPSULE | Freq: Every day | ORAL | Status: DC
  Administered 2021-03-07 – 2021-03-11 (×5): 0.4 mg via ORAL
  Filled 2021-03-07 (×5): qty 1

## 2021-03-07 MED ORDER — PANTOPRAZOLE SODIUM 40 MG IV SOLR
40.0000 mg | Freq: Two times a day (BID) | INTRAVENOUS | Status: DC
  Administered 2021-03-07 – 2021-03-09 (×6): 40 mg via INTRAVENOUS
  Filled 2021-03-07 (×6): qty 40

## 2021-03-07 MED ORDER — SODIUM CHLORIDE 0.9 % IV SOLN
510.0000 mg | Freq: Once | INTRAVENOUS | Status: AC
  Administered 2021-03-07: 510 mg via INTRAVENOUS
  Filled 2021-03-07: qty 17

## 2021-03-07 NOTE — Progress Notes (Signed)
Multiple failed attempts at using collection devices for output. Patient has urinated on the floor several times attempting to use bedside commode and walking to bathroom. Patient was unable to tolerate condom catheter. Not a candidate for primofit.

## 2021-03-07 NOTE — Consult Note (Addendum)
[facility] Medical Consultation  David L Overton Mam. [id number] DOB: 02-May-1936 DOA: 03/06/2021 PCP: [name], MD   Requesting physician: Cleatrice Burke, MD Date of consultation: 03/07/2021 Reason for consultation: Medical management  Impression/Recommendations    Heart failure with reduced EF AI/TR: Patient presented with concern for possible heart failure exacerbation.  Noted to have a EF of around 20-25% with aortic insufficiency, but patient was noted to be a candidate for AVR/aortic root replacement at this time. -Per cardiology  Acute blood loss anemia iron deficiency : Patient reports that David [name] has been having intermittent dark stools.  Presented with hemoglobin of 9.4 g/dL with iron deficiency, but baseline previously had been 13 g/dL in April of this year.  Denies routine use of NSAIDs and last colonoscopy was approximately 8 years ago. -Type and screen for possible need of blood products -Serial monitoring of H&H -Discontinue Lovenox -Held aspirin -Protonix IV -Feraheme infusion x1 -Consulted [facility] for the possible need EGD/colonoscopy  Myasthenia gravis: Initially was concern of possible myasthenia gravis flare given reports of increased fatigue with shortness of breath.  Note NIF -25 and vital capacity 1.82.  -Check NIF and vital capacity every 12 hours.  Likely discontinue tomorrow if stable. -Continue home medication regimen  BPH with urinary obstruction hematuria history of nephrolithiasis: Patient complained of abdominal cramping and required in and out cath producing 900 mL of urine.  Patient with prior TURP in 2020 and need of lithotripsy with ureteral stent placement in 2021.  noted large hemoglobin -Place Foley catheter -Check renal ultrasound -Flomax daily  Complete heart block status post pacemaker  Hypokalemia: Acute.  Potassium noted to be 3.2.  Patient has been ordered 20 mEq of potassium chloride p.o. -Give additional 40 mEq of  potassium chloride x1 dose -Continue to monitor and replace as needed  Fatigue: Suspect secondary to anemia.    I will followup again tomorrow. Please contact me if I can be of assistance in the meanwhile. Thank you for this consultation.  Chief Complaint: Shortness of breath  HPI:  David L Landun [name]. is a 85 y.o. male with medical history significant of HTN, HLD, AI/TR, CAD s/p PCI, CHB s/p PM, myasthenia gravis, nephrolithiasis, and spinal stenosis who presented with complaints of leg swelling and shortness of breath.  David [name] had recently had a echocardiogram and was following up with Dr. Stanford Breed yesterday and was advised to come to the hospital admitted for presumed heart failure exacerbation.  David [name] did admit to having complaints of leg swelling, weight gain, and shortness of breath with exertion.  Patient notes that here lately David [name] has been having difficulty urinating lately and also reported black stools for several weeks to months.  David [name] complains of some lower abdominal pain his wife notes that David [name] has been having intermittent episodes of diarrhea and constipation.  David [name] had been followed by Dr. Gilford Rile of urology and had a TURP back in 06/2019, and underwent left ureteroscopy with laser with Trulicity and ureteral stent placement for a left UPJ stone with hydronephrosis in 08/2019.  Patient reports that David [name] does occasionally use ibuprofen, but reports that is very infrequently for headaches.  David [name] thinks that possibly Dr. Watt Climes did his last colonoscopy 8 years at which point David [name] was told David [name] did not need to do another one.  Patient's initial BNP was noted to be 1466.8.  Patient had been given Lasix overnight diuresis of 2.5 L   Review of Systems  Constitutional:  Positive for malaise/fatigue. Negative for fever.  Positive for weight gain  HENT:  Negative for congestion and nosebleeds.   Eyes:  Negative for photophobia and pain.  Respiratory:  Positive for shortness of breath. Negative for cough.    Cardiovascular:  Positive for leg swelling and PND. Negative for chest pain.  Gastrointestinal:  Positive for abdominal pain and melena. Negative for nausea and vomiting.  Genitourinary:  Positive for urgency. Negative for dysuria.  Musculoskeletal:  Negative for falls.  Neurological:  Positive for weakness.    Past Medical History:  Diagnosis Date   CAD (coronary artery disease)    previous stent to LAD 2003   Diabetes mellitus without complication ([place])    No longer taking diabetic medications at this time   History of complete heart block    History of kidney stones    Hyperlipidemia    Hypertension    LBBB (left bundle branch block)    Melanoma (HCC)    Myasthenia gravis (HCC)    eyelids, finger, and toes   NSVT (nonsustained ventricular tachycardia) (HCC)    OSA (obstructive sleep apnea)    Does not wear CPAP.   Presence of permanent cardiac pacemaker    S/P cardiac pacemaker procedure, placement 10/24/14 Medtronic Adapta L model ADDRL 1  10/25/2014   Spinal stenosis    Severe   Past Surgical History:  Procedure Laterality Date   COLONOSCOPY     CORONARY ANGIOPLASTY WITH STENT PLACEMENT  03/01/2002   stenting to LAD - Dr. Glade Lloyd   CYSTOSCOPY/URETEROSCOPY/HOLMIUM LASER/STENT PLACEMENT Left 08/31/2019   Procedure: CYSTOSCOPY/RETROGRADE/STENT PLACEMENT;  Surgeon: Ceasar Mons, MD;  Location: [facility];  Service: Urology;  Laterality: Left;  ONY NEEDS 45 MIN   NM MYOCAR PERF WALL MOTION  11/27/2010   normal   PERMANENT PACEMAKER INSERTION N/A 10/24/2014   Procedure: PERMANENT PACEMAKER INSERTION;  Surgeon: [name], MD;  Location: [facility] CATH LAB;  Service: Cardiovascular;  Laterality: N/A;   TONSILLECTOMY     TRANSURETHRAL RESECTION OF PROSTATE N/A 07/06/2019   Procedure: TRANSURETHRAL RESECTION OF THE PROSTATE (TURP), BIPOLAR;  Surgeon: Ceasar Mons, MD;  Location: [facility];  Service: Urology;  Laterality: N/A;   US  ECHOCARDIOGRAPHY  12/05/2010   proximal septal thickening,mild concentric LVH,mild deptal hypokinesis,RV mildly dilated,LA mildly dilated,mild AI,moderate aortic root dilatation,septal motion c/w conduction abnormality   Social History:  reports that David [name] quit smoking about 38 years ago. His smoking use included cigarettes and cigars. David [name] started smoking about 71 years ago. David [name] has never used smokeless tobacco. David [name] reports that David [name] does not currently use alcohol. David [name] reports that David [name] does not use drugs.  Allergies  Allergen Reactions   Azithromycin Other (See Comments)   Clindamycin Hcl Other (See Comments)   Doxycycline Diarrhea   Doxycycline Hyclate Other (See Comments)   Levofloxacin Other (See Comments)   Niacin Other (See Comments)   Gris-Peg [Griseofulvin] Other (See Comments)    headaches   Terbinafine Other (See Comments)    headache   Family History  Problem Relation Age of Onset   Breast cancer Mother    Colon cancer Father    Lung cancer Father     Prior to Admission medications   Medication Sig Start Date End Date Taking? Authorizing Provider  aspirin EC 81 MG tablet Take 81 mg by mouth daily.    Yes [provider]  desmopressin (DDAVP) 0.1 MG tablet TAKE 1 TABLET(0.1 MG) BY MOUTH AT BEDTIME Patient taking differently: Take 0.1 mg by mouth  at bedtime. 12/17/20  Yes Sater, Nanine Means, MD  Ensure (ENSURE) Take 237 mLs by mouth daily as needed (feeding suppliment).   Yes [provider]  ezetimibe-simvastatin (VYTORIN) 10-40 MG tablet Take 1 tablet by mouth daily.   Yes [provider]  loratadine (CLARITIN) 10 MG tablet Take 10 mg by mouth daily.   Yes [provider]  mycophenolate (CELLCEPT) 250 MG capsule TAKE 2 CAPSULES BY MOUTH TWICE DAILY Patient taking differently: Take 500 mg by mouth 2 (two) times daily. 11/26/20  Yes Sater, Nanine Means, MD  MYRBETRIQ 50 MG TB24 tablet Take 50 mg by mouth daily. 08/21/20  Yes [provider]   nitroGLYCERIN (NITROSTAT) 0.4 MG SL tablet Place 1 tablet (0.4 mg total) under the tongue every 5 (five) minutes as needed for chest pain. Reported on 11/01/2015 Patient taking differently: Place 0.4 mg under the tongue every 5 (five) minutes as needed for chest pain. 11/07/15  Yes [name], MD  Polyethyl Glycol-Propyl Glycol (SYSTANE) 0.4-0.3 % SOLN Place 1 drop into both eyes daily as needed (dry eyes).   Yes [provider]  pyridostigmine (MESTINON) 60 MG tablet Take up to 5-9 pills per day Patient taking differently: Take 120 mg by mouth every 2 (two) hours. 8 tablets total 05/22/20  Yes Sater, Nanine Means, MD  saccharomyces boulardii (FLORASTOR) 250 MG capsule Take 250 mg by mouth 2 (two) times daily.   Yes [provider]  traZODone (DESYREL) 50 MG tablet Take 0.5-1 tablets (25-50 mg total) by mouth at bedtime as needed for sleep. 02/14/21  Yes Sater, Nanine Means, MD  Wheat Dextrin (BENEFIBER) POWD Take 2 Scoops by mouth daily.   Yes [provider]  furosemide (LASIX) 20 MG tablet Take 1 tablet (20 mg total) by mouth every other day. 02/26/21   Lelon Perla, MD  sacubitril-valsartan (ENTRESTO) 24-26 MG Take 1 tablet by mouth 2 (two) times daily. 03/01/21   Lelon Perla, MD  sildenafil (REVATIO) 20 MG tablet One po prn for ED 1/2 hour before sexual relations.   Do not take nitroglycerin the same day Patient not taking: Reported on 03/06/2021 11/20/20   Britt Bottom, MD  tamsulosin (FLOMAX) 0.4 MG CAPS capsule Take 1 capsule (0.4 mg total) by mouth as needed. Patient not taking: No sig reported 01/04/20   Baldwin Jamaica, PA-C   Physical Exam:  Constitutional: Elderly male who appears to be in no acute distress but appeared fatigued Vitals:   03/07/21 0043 03/07/21 0352 03/07/21 0716 03/07/21 1123  BP:  (!) 148/70 123/71 (!) 130/114  Pulse:  72 82 67  Resp:  '20 17 16  '$ Temp:  97.8 F (36.6 C) 97.9 F (36.6 C) 97.8 F (36.6 C)  TempSrc:  Oral     SpO2:  93% (!) 89% 94%  Weight: 74.7 kg      Eyes: PERRL, lids and conjunctivae normal ENMT: Mucous membranes are moist. Posterior pharynx clear of any exudate or lesions.  Neck: normal, supple, no masses, no thyromegaly Respiratory: Respiratory effort with decreased aeration with some rales, but no significant wheezes appreciated.  Able to talk in complete sentences Cardiovascular: Regular rate and rhythm, no murmurs / rubs / gallops.  +1 pitting edema extremity edema. 2+ pedal pulses. No carotid bruits.  Abdomen: Suprapubic tenderness, no masses palpated. No hepatosplenomegaly. Bowel sounds positive.  Musculoskeletal: no clubbing / cyanosis. No joint deformity upper and lower extremities. Good ROM, no contractures. Normal muscle tone.  Skin:  no rashes, lesions, ulcers. No induration Neurologic: CN 2-12 grossly intact. Sensation intact, DTR normal.  Able to move all extremity Psychiatric: Normal judgment and insight. Alert and oriented x 3. Normal mood.   Labs on Admission:  Basic Metabolic Panel: Recent Labs  Lab 03/06/21 2209 03/07/21 0446  NA 135 136  K 4.2 3.2*  CL 99 94*  CO2 32 32  GLUCOSE 95 114*  BUN 17 15  CREATININE 0.52* 0.55*  CALCIUM 8.5* 8.7*   Liver Function Tests: Recent Labs  Lab 03/06/21 2209  AST 33  ALT 25  ALKPHOS 56  BILITOT 1.0  PROT 5.4*  ALBUMIN 3.3*   No results for input(s): LIPASE, AMYLASE in the last 168 hours. No results for input(s): AMMONIA in the last 168 hours. CBC: Recent Labs  Lab 03/06/21 2209  WBC 4.8  HGB 9.4*  HCT 30.8*  MCV 89.5  PLT 185   Cardiac Enzymes: No results for input(s): CKTOTAL, CKMB, CKMBINDEX, TROPONINI in the last 168 hours. BNP: Invalid input(s): POCBNP CBG: No results for input(s): GLUCAP in the last 168 hours.  Radiological Exams on Admission: DG Chest Port 1 View  Result Date: 03/07/2021 CLINICAL DATA:  Congestive heart failure EXAM: PORTABLE CHEST 1 VIEW COMPARISON:  11/26/2020 FINDINGS:  Cardiac enlargement. Dual lead pacemaker unchanged. Negative for heart failure or edema. Mild elevation right hemidiaphragm unchanged with mild right lower lobe atelectasis. No pleural effusion identified. IMPRESSION: Negative for heart failure. Mild right lower lobe atelectasis unchanged. Electronically Signed   By: Franchot Gallo M.D.   On: 03/07/2021 08:10    EKG: Independently reviewed.  Paced rhythm at 73 bpm  Time spent: >45 minutes  [facility]   If 7PM-7AM, please contact night-coverage

## 2021-03-07 NOTE — TOC Initial Note (Addendum)
Transition of Care (TOC) - Initial/Assessment Note    Patient Details  Name: David [name]. MRN: [id number] Date of Birth: [birth year]  Transition of Care [facility]) CM/SW Contact:    Verdell Carmine, RN Phone Number: 03/07/2021, 5:21 PM  Clinical Narrative:                  Patient with known heart failure, in with dark stools, HBG drop. . Patient lives with wife. Was found to have iron deficiency anemia, acute blood loss, History of Myasthenia gravis. Diuresed today 4L, patient becaume hypotensive this afternoon. With weakness may need some home therapy, will follow for needs PT consulted to see tomorrow for recommendations  Expected Discharge Plan: Home/Self Care     Patient Goals and CMS Choice        Expected Discharge Plan and Services Expected Discharge Plan: Home/Self Care       Living arrangements for the past 2 months: Single Family Home                                      Prior Living Arrangements/Services Living arrangements for the past 2 months: Single Family Home Lives with:: Spouse Patient language and need for interpreter reviewed:: Yes        Need for Family Participation in Patient Care: Yes (Comment) Care giver support system in place?: Yes (comment)   Criminal Activity/Legal Involvement Pertinent to Current Situation/Hospitalization: No - Comment as needed  Activities of Daily Living      Permission Sought/Granted                  Emotional Assessment       Orientation: : Oriented to Self, Oriented to Place, Oriented to  Time, Oriented to Situation Alcohol / Substance Use: Not Applicable Psych Involvement: No (comment)  Admission diagnosis:  Acute HFrEF (heart failure with reduced ejection fraction) ([place]) [I50.21] Patient Active Problem List   Diagnosis Date Noted   Acute blood loss anemia 03/07/2021   Acute HFrEF (heart failure with reduced ejection fraction) ([place]) 03/06/2021   Aortic insufficiency 03/06/2021    Tricuspid regurgitation 03/06/2021   Actinic keratosis 08/24/2020   Cardiac pacemaker in situ 08/24/2020   Conduction disorder of the heart 08/24/2020   Dependence on other enabling machines and devices 08/24/2020   Diarrhea 08/24/2020   Easy bruising 08/24/2020   Family history of malignant neoplasm of gastrointestinal tract 08/24/2020   H/O urethritis 08/24/2020   Has a tremor 08/24/2020   Hearing loss 08/24/2020   Hemorrhoids 08/24/2020   History of gastrointestinal disease 08/24/2020   History of subarachnoid hemorrhage 08/24/2020   Long term (current) use of aspirin 08/24/2020   Immunodeficiency disorder ([place]) 08/24/2020   Nocturnal enuresis 08/24/2020   Personal history of (healed) traumatic fracture 08/24/2020   Type 2 diabetes mellitus with unspecified complications ([place]) [id number]   Vitamin D deficiency 08/24/2020   High risk medication use 11/17/2019   Gait abnormality 11/17/2019   Urinary frequency 11/17/2019   BPH with obstruction/lower urinary tract symptoms 07/06/2019   Dermatochalasis of both upper eyelids 06/15/2018   Peroneal neuropathy, left 05/12/2018   Left foot drop 05/12/2018   Dysphagia 03/26/2018   Lumbosacral radiculopathy 02/23/2018   Facial pain 11/06/2017   Degenerative lumbar spinal stenosis 09/03/2017   Vallecular mass 12/31/2016   Myasthenia gravis ([place]) 05/01/2016   Ptosis of both eyelids 05/01/2016   CAD in  native artery 11/03/2015   OSA (obstructive sleep apnea) 11/03/2015   S/P cardiac pacemaker procedure, placement 10/24/14 Medtronic Adapta L model ADDRL 1  10/25/2014   Complete heart block (HCC) 10/24/2014   CHB (complete heart block), symptomatic 10/24/2014   LAD stent 2003 10/26/2013   HTN (hypertension) 10/26/2013   Hyperlipidemia with target LDL less than 70 10/26/2013   Mild obesity 10/26/2013   Spermatic cord mass-left 03/25/2012   PCP:  [name], MD Pharmacy:   [facility] [place] [address] [place] [address] Phone: [phone] Fax: [phone]  [facility] [address] [place] [address] Phone: [phone] Fax: [phone]     Social Determinants of Health (SDOH) Interventions    Readmission Risk Interventions No flowsheet data found.

## 2021-03-07 NOTE — Progress Notes (Signed)
Progress Note  Patient Name: David [name]. Date of Encounter: 03/07/2021  [facility] Cardiologist: Rayann Heman / [place]  Subjective   85 year old gentleman with a history of coronary artery disease, moderate to severe aortic insufficiency, dilated ascending aorta, hypertension.  Also has myasthemia gravis.   He was admitted from the [facility] office with acute congestive heart failure.  He has had progressive shortness of breath with exertion for the past several weeks.  His O2 sats are still low - 89% this am BP is stable  I/O are -2.5 liters overnight   Hb is 9.4.   this is down from 13.2 from April 2022. Has iron deficiency anemia ( iron is 15, )   He says his stools have been black and tarry for months   Inpatient Medications    Scheduled Meds:  aspirin EC  81 mg Oral Daily   desmopressin  0.1 mg Oral QHS   empagliflozin  10 mg Oral Daily   enoxaparin (LOVENOX) injection  40 mg Subcutaneous Q24H   ezetimibe  10 mg Oral Daily   And   simvastatin  40 mg Oral Daily   furosemide  40 mg Intravenous BID   loratadine  10 mg Oral Daily   mirabegron ER  50 mg Oral Daily   mycophenolate  500 mg Oral BID   polycarbophil  625 mg Oral Daily   potassium chloride  20 mEq Oral Daily   pyridostigmine  120 mg Oral QID   saccharomyces boulardii  250 mg Oral BID   sacubitril-valsartan  1 tablet Oral BID   sodium chloride flush  3 mL Intravenous Q12H   spironolactone  25 mg Oral Daily   Continuous Infusions:  sodium chloride     PRN Meds: sodium chloride, acetaminophen, ondansetron (ZOFRAN) IV, polyvinyl alcohol, sodium chloride flush, traZODone   Vital Signs    Vitals:   03/06/21 2252 03/07/21 0043 03/07/21 0352 03/07/21 0716  BP: 131/77  (!) 148/70 123/71  Pulse: 68  72 82  Resp:   20 17  Temp:   97.8 F (36.6 C) 97.9 F (36.6 C)  TempSrc:   Oral   SpO2:   93% (!) 89%  Weight:  74.7 kg      Intake/Output Summary (Last 24 hours) at 03/07/2021 0951 Last  data filed at 03/07/2021 0924 Gross per 24 hour  Intake 220 ml  Output 2800 ml  Net -2580 ml   Last 3 Weights 03/07/2021 03/06/2021 03/06/2021  Weight (lbs) 164 lb 11.2 oz 168 lb 9.6 oz 172 lb 1.9 oz  Weight (kg) 74.707 kg 76.476 kg 78.073 kg      Telemetry     - Personally Reviewed  ECG    Sinus rhy - Personally Reviewed  Physical Exam    GEN: elderly man,  appears generally weak.    Neck: No JVD Cardiac: RRR,  very soft diastolic murmur  Respiratory: rales bilaterally,  L > R  GI: Soft, nontender, non-distended  MS: trace - 1 + pitting edema  Neuro:  extremely weak  Psych: Normal affect   Labs    High Sensitivity Troponin:  No results for input(s): TROPONINIHS in the last 720 hours.    Chemistry Recent Labs  Lab 03/06/21 2209 03/07/21 0446  NA 135 136  K 4.2 3.2*  CL 99 94*  CO2 32 32  GLUCOSE 95 114*  BUN 17 15  CREATININE 0.52* 0.55*  CALCIUM 8.5* 8.7*  PROT 5.4*  --   ALBUMIN  3.3*  --   AST 33  --   ALT 25  --   ALKPHOS 56  --   BILITOT 1.0  --   GFRNONAA >60 >60  ANIONGAP 4* 10     Hematology Recent Labs  Lab 03/06/21 2209  WBC 4.8  RBC 3.44*  HGB 9.4*  HCT 30.8*  MCV 89.5  MCH 27.3  MCHC 30.5  RDW 15.4  PLT 185    BNP Recent Labs  Lab 03/06/21 2209  BNP 1,466.8*     DDimer No results for input(s): DDIMER in the last 168 hours.   Radiology    DG Chest Port 1 View  Result Date: 03/07/2021 CLINICAL DATA:  Congestive heart failure EXAM: PORTABLE CHEST 1 VIEW COMPARISON:  11/26/2020 FINDINGS: Cardiac enlargement. Dual lead pacemaker unchanged. Negative for heart failure or edema. Mild elevation right hemidiaphragm unchanged with mild right lower lobe atelectasis. No pleural effusion identified. IMPRESSION: Negative for heart failure. Mild right lower lobe atelectasis unchanged. Electronically Signed   By: Franchot Gallo M.D.   On: 03/07/2021 08:10    Cardiac Studies     Patient Profile     85 y.o. male  with generalized  weakness, CHF, AI, iron deficient anemia   Assessment & Plan     1.   CHF   :  has EF 20-25%.   No evidence of CHF on CXR.  Only trace edema   Has Left sided rales on exam - cxr shows atelectasis I think this is more of a generalized failure to thrive issue  He does have reduced EF associated with aortic insufficiency . Also has a dilated aortic root. At present, he is very weak and does not look like a good candidate for AVR / aortic root replacement  Continue medical therapy  He has diuresed 2.5 liters overnight .   2.  Aortic insufficiency  Echo from 2016 showed mild AI Current echo shows mod - severe AI.   Also has dilatation of his aortic root.  Cont medical therapy for this .  Its unclear if he is a good candidate for AVR - he is very weak   3.  Anemia:  iron deficiency anemia .  Has had dark tarry stools for months .   Iron level is low. This will need further evaluation   4.  Urinary retention :  will order I/O cath   5.  Myasthemia gravis:   hx    Given his numerous medical issues, I will consult the [facility] team for help in managing his medical issues.     For questions or updates, please contact [place] Please consult [email or web address] for contact info under        Signed, Mertie Moores, MD  03/07/2021, 9:51 AM

## 2021-03-07 NOTE — Consult Note (Addendum)
Referring Provider:  Cardiology  Primary Care Physician:  [name], MD Primary Gastroenterologist:  Dr. Watt Climes  Reason for Consultation: Anemia, dark stools  HPI: David [name]. is a 85 y.o. male with past medical history of myasthenia gravis, coronary artery disease s/p PCI,hypertension, hyperlipidemia and spinal stenosis presented to the hospital with leg swelling and shortness of breath.  Was initially diagnosed with heart failure.  EF of around 20 to 25% with aortic insufficiency.  He was noted to have some dark stools and drop in hemoglobin.  .  GI was consulted for further evaluation.  Patient's hemoglobin was around 13 in April 2022.  Hemoglobin on admission yesterday was 9.4.  Normal LFTs.  Normal BUN.  Study shows low iron saturation and elevated TIBC consistent with iron deficiency anemia.  Occult blood positive.  According to patient, he has been having intermittent black stool for many months now.  Complaining of diarrhea alternating with constipation.  Denies nausea or vomiting.  Denies fresh blood in the stool.  Denies reflux, trouble swallowing or pain while swallowing.   Last colonoscopy in August 2016 showed diverticulosis and hemorrhoids.  Past Medical History:  Diagnosis Date   CAD (coronary artery disease)    previous stent to LAD 2003   Diabetes mellitus without complication ([place])    No longer taking diabetic medications at this time   History of complete heart block    History of kidney stones    Hyperlipidemia    Hypertension    LBBB (left bundle branch block)    Melanoma (HCC)    Myasthenia gravis (HCC)    eyelids, finger, and toes   NSVT (nonsustained ventricular tachycardia) (HCC)    OSA (obstructive sleep apnea)    Does not wear CPAP.   Presence of permanent cardiac pacemaker    S/P cardiac pacemaker procedure, placement 10/24/14 Medtronic Adapta L model ADDRL 1  10/25/2014   Spinal stenosis    Severe    Past Surgical History:  Procedure  Laterality Date   COLONOSCOPY     CORONARY ANGIOPLASTY WITH STENT PLACEMENT  03/01/2002   stenting to LAD - Dr. Glade Lloyd   CYSTOSCOPY/URETEROSCOPY/HOLMIUM LASER/STENT PLACEMENT Left 08/31/2019   Procedure: CYSTOSCOPY/RETROGRADE/STENT PLACEMENT;  Surgeon: Ceasar Mons, MD;  Location: [facility];  Service: Urology;  Laterality: Left;  ONY NEEDS 45 MIN   NM MYOCAR PERF WALL MOTION  11/27/2010   normal   PERMANENT PACEMAKER INSERTION N/A 10/24/2014   Procedure: PERMANENT PACEMAKER INSERTION;  Surgeon: [name], MD;  Location: [facility] C[facility] LAB;  Service: Cardiovascular;  Laterality: N/A;   TONSILLECTOMY     TRANSURETHRAL RESECTION OF PROSTATE N/A 07/06/2019   Procedure: TRANSURETHRAL RESECTION OF THE PROSTATE (TURP), BIPOLAR;  Surgeon: Ceasar Mons, MD;  Location: [facility];  Service: Urology;  Laterality: N/A;   US ECHOCARDIOGRAPHY  12/05/2010   proximal septal thickening,mild concentric LVH,mild deptal hypokinesis,RV mildly dilated,LA mildly dilated,mild AI,moderate aortic root dilatation,septal motion c/w conduction abnormality    Prior to Admission medications   Medication Sig Start Date End Date Taking? Authorizing Provider  aspirin EC 81 MG tablet Take 81 mg by mouth daily.    Yes [provider]  desmopressin (DDAVP) 0.1 MG tablet TAKE 1 TABLET(0.1 MG) BY MOUTH AT BEDTIME Patient taking differently: Take 0.1 mg by mouth at bedtime. 12/17/20  Yes Sater, Nanine Means, MD  Ensure (ENSURE) Take 237 mLs by mouth daily as needed (feeding suppliment).   Yes [provider]  ezetimibe-simvastatin (VYTORIN) 10-40 MG tablet Take 1 tablet by mouth daily.   Yes [provider]  loratadine (CLARITIN) 10 MG tablet Take 10 mg by mouth daily.   Yes [provider]  mycophenolate (CELLCEPT) 250 MG capsule TAKE 2 CAPSULES BY MOUTH TWICE DAILY Patient taking differently: Take 500 mg by mouth 2 (two) times daily. 11/26/20  Yes  Sater, Nanine Means, MD  MYRBETRIQ 50 MG TB24 tablet Take 50 mg by mouth daily. 08/21/20  Yes [provider]  nitroGLYCERIN (NITROSTAT) 0.4 MG SL tablet Place 1 tablet (0.4 mg total) under the tongue every 5 (five) minutes as needed for chest pain. Reported on 11/01/2015 Patient taking differently: Place 0.4 mg under the tongue every 5 (five) minutes as needed for chest pain. 11/07/15  Yes [name], MD  Polyethyl Glycol-Propyl Glycol (SYSTANE) 0.4-0.3 % SOLN Place 1 drop into both eyes daily as needed (dry eyes).   Yes [provider]  pyridostigmine (MESTINON) 60 MG tablet Take up to 5-9 pills per day Patient taking differently: Take 120 mg by mouth every 2 (two) hours. 8 tablets total 05/22/20  Yes Sater, Nanine Means, MD  saccharomyces boulardii (FLORASTOR) 250 MG capsule Take 250 mg by mouth 2 (two) times daily.   Yes [provider]  traZODone (DESYREL) 50 MG tablet Take 0.5-1 tablets (25-50 mg total) by mouth at bedtime as needed for sleep. 02/14/21  Yes Sater, Nanine Means, MD  Wheat Dextrin (BENEFIBER) POWD Take 2 Scoops by mouth daily.   Yes [provider]  furosemide (LASIX) 20 MG tablet Take 1 tablet (20 mg total) by mouth every other day. 02/26/21   Lelon Perla, MD  sacubitril-valsartan (ENTRESTO) 24-26 MG Take 1 tablet by mouth 2 (two) times daily. 03/01/21   Lelon Perla, MD  tamsulosin (FLOMAX) 0.4 MG CAPS capsule Take 1 capsule (0.4 mg total) by mouth as needed. Patient not taking: No sig reported 01/04/20   Baldwin Jamaica, PA-C    Scheduled Meds:  desmopressin  0.1 mg Oral QHS   empagliflozin  10 mg Oral Daily   ezetimibe  10 mg Oral Daily   And   simvastatin  40 mg Oral Daily   furosemide  40 mg Intravenous BID   loratadine  10 mg Oral Daily   mirabegron ER  50 mg Oral Daily   mycophenolate  500 mg Oral BID   pantoprazole (PROTONIX) IV  40 mg Intravenous Once   polycarbophil  625 mg Oral Daily   potassium chloride  20 mEq Oral Daily    potassium chloride  40 mEq Oral STAT   pyridostigmine  120 mg Oral QID   saccharomyces boulardii  250 mg Oral BID   sacubitril-valsartan  1 tablet Oral BID   sodium chloride flush  3 mL Intravenous Q12H   spironolactone  25 mg Oral Daily   tamsulosin  0.4 mg Oral Daily   Continuous Infusions:  sodium chloride     PRN Meds:.sodium chloride, acetaminophen, ondansetron (ZOFRAN) IV, polyvinyl alcohol, sodium chloride flush, traZODone  Allergies as of 03/06/2021 - Review Complete 03/06/2021  Allergen Reaction Noted   Azithromycin Other (See Comments) 05/12/2020   Clindamycin hcl Other (See Comments) 05/12/2020   Doxycycline Diarrhea 08/24/2020   Doxycycline hyclate Other (See Comments) 05/12/2020   Levofloxacin Other (See Comments) 05/12/2020   Niacin Other (See Comments) 05/12/2020   Gris-peg [griseofulvin] Other (See Comments) 03/25/2012   Terbinafine Other (See Comments) 10/09/2013    Family History  Problem Relation Age of Onset   Breast cancer Mother    Colon cancer Father    Lung cancer Father     Social History   Socioeconomic History   Marital status: Married    Spouse name: Not on file   Number of children: Not on file   Years of education: Not on file   Highest education level: Not on file  Occupational History   Not on file  Tobacco Use   Smoking status: Former    Types: Cigarettes, Cigars    Start date: 04/28/1949    Quit date: 10/27/1982    Years since quitting: 38.3   Smokeless tobacco: Never  Vaping Use   Vaping Use: Never used  Substance and Sexual Activity   Alcohol use: Not Currently    Comment: 1-2 drink per week   Drug use: No   Sexual activity: Not on file  Other Topics Concern   Not on file  Social History Narrative   Not on file   Social Determinants of Health   Financial Resource Strain: Not on file  Food Insecurity: Not on file  Transportation Needs: Not on file  Physical Activity: Not on file  Stress: Not on file  Social  Connections: Not on file  Intimate Partner Violence: Not on file    Review of Systems: All negative except as stated above in HPI.  Physical Exam: Vital signs: Vitals:   03/07/21 0716 03/07/21 1123  BP: 123/71 (!) 130/114  Pulse: 82 67  Resp: 17 16  Temp: 97.9 F (36.6 C) 97.8 F (36.6 C)  SpO2: (!) 89% 94%   Last BM Date: 03/06/21 General:   Elderly patient, not in acute distress Lungs: No visible respiratory distress Heart:  Regular rate and rhythm; murmur noted  abdomen:, Nontender, nondistended, bowel sounds present.  No peritoneal sign Lower extremity edema Neuro : Alert and oriented x3 Psych : Mood and affect normal Rectal:  Deferred  GI:  Lab Results: Recent Labs    03/06/21 2209 03/07/21 1115  WBC 4.8  --   HGB 9.4* 10.4*  HCT 30.8* 34.5*  PLT 185  --    BMET Recent Labs    03/06/21 2209 03/07/21 0446  NA 135 136  K 4.2 3.2*  CL 99 94*  CO2 32 32  GLUCOSE 95 114*  BUN 17 15  CREATININE 0.52* 0.55*  CALCIUM 8.5* 8.7*   LFT Recent Labs    03/06/21 2209  PROT 5.4*  ALBUMIN 3.3*  AST 33  ALT 25  ALKPHOS 56  BILITOT 1.0   PT/INR No results for input(s): LABPROT, INR in the last 72 hours.   Studies/Results: DG Chest Port 1 View  Result Date: 03/07/2021 CLINICAL DATA:  Congestive heart failure EXAM: PORTABLE CHEST 1 VIEW COMPARISON:  11/26/2020 FINDINGS: Cardiac enlargement. Dual lead pacemaker unchanged. Negative for heart failure or edema. Mild elevation right hemidiaphragm unchanged with mild right lower lobe atelectasis. No pleural effusion identified. IMPRESSION: Negative for heart failure. Mild right lower lobe atelectasis unchanged. Electronically Signed   By: Franchot Gallo M.D.   On: 03/07/2021 08:10    Impression/Plan: -Iron deficiency anemia with intermittent melena for few months now. (More than 2 months according to patient)  occult blood positive. -Cardiomyopathy with EF of 25 to 30%.  Moderate to severe aortic  insufficiency.  Recommendations ------------------------- -Intermittent black stool for many months could be from gastritis or small peptic ulcer. -Recommend conservative management for now given recent cardiomyopathy, CHF with  EF of 25 to 30%.  Patient also prefers noninvasive management. -Increase Protonix to IV twice daily while in hospital -Avoid NSAIDs -Repeat CBC in the morning -Advance diet to soft -GI will follow    LOS: 1 day   Otis Brace  MD, FACP 03/07/2021, 12:25 PM  Contact #  [phone]

## 2021-03-07 NOTE — Progress Notes (Signed)
Heart Failure Stewardship Pharmacist Progress Note   PCP: [name], MD PCP-Cardiologist: None    HPI:  85 yo M with PMH of CAD, T2DM, HLD, HTN, myasthenia gravis, OSA, LBBB, and CHB s/p PPM in 2016. He presented to [facility] for outpatient follow up. He was complaining of DOE, persistent LE edema, and orthopnea. Admitted to [facility] for IV diuresis. CXR on admission negative for CHF. Last ECHO done on 02/26/21 with LVEF 20-25% and G1DD with severely reduced RV systolic function and moderate-severe AI and dilated aortic root.  Current HF Medications: Furosemide 40 mg IV BID Entresto 24/26 mg BID Spironolactone 25 mg daily Jardiance 10 mg daily  Prior to admission HF Medications: Furosemide 20 mg every other day  Pertinent Lab Values: Serum creatinine 0.55, BUN 15, Potassium 3.2, Sodium 136, BNP 1466.8  Vital Signs: Weight: 164 lbs (admission weight: 164 lbs) Blood pressure: 130/70s  Heart rate: 70s   Medication Assistance / Insurance Benefits Check: Does the patient have prescription insurance?  Yes Type of insurance plan: BCBS Medicare  Does the patient qualify for medication assistance through manufacturers or grants?   Pending household income info Eligible grants and/or patient assistance programs: pending Medication assistance applications in progress: none  Medication assistance applications approved: none Approved medication assistance renewals will be completed by: pending  Outpatient Pharmacy:  Prior to admission outpatient pharmacy: [facility] Is the patient willing to use [facility] pharmacy at discharge? Yes Is the patient willing to transition their outpatient pharmacy to utilize a [facility] outpatient pharmacy?   Pending    Assessment: 1. Acute on chronic systolic CHF (EF 0000000). NYHA class III symptoms. - Continue furosemide 40 mg IV BID - Caution addition of BB with myasthenia gravis - Continue Entresto 24/26 mg BID - Continue spironolactone 25 mg daily - Agree  with starting Jardiance 10 mg daily   Plan: 1) Medication changes recommended at this time: - Agree with changes as above  2) Patient assistance: - Has BCBS Medicare, in coverage gap (donut hole) - Initial copays for Entresto and Jardiance are >$100 per month until out of coverage gap - Can help enroll in PAN grant vs manufacturers patient assistance to lower copays to $0 per month - HF TOC appt scheduled for 8/22  3)  Education  - To be completed prior to discharge  Kerby Nora, PharmD, BCPS Heart Failure Cytogeneticist Phone [phone]

## 2021-03-07 NOTE — Progress Notes (Signed)
Pt preformed NIF of -25 and VC of 1.82L MD aware.

## 2021-03-07 NOTE — Progress Notes (Signed)
  Mobility Specialist Criteria Algorithm Info.  Mobility Team: HOB elevated: Activity: Ambulated in room; Transferred to/from [facility]; Transferred:  Chair to bed Range of motion: Active; All extremities Level of assistance: Contact guard assist, steadying assist Assistive device: Front wheel walker Minutes sitting in chair: No data recorded Minutes stood: 3 minutes Minutes ambulated: 1 minutes Distance ambulated (ft): 10 ft Mobility response: Tolerated well Bed Position: Semi-fowlers  Patient received sitting in recliner chair with RN and MD present. Agreed to participate in mobility with anticipation of increased odds for bowel urgency. Patient stood with supervision + cues for hand placement on RW, upon standing pt needed to use [facility] for bowel movement. Ambulated to commode with slow steady gait. Urine incontinence limited distance. Pt ambulated back to bed per RN's request for in-out foley catheter insertion. Tolerated short bouts of ambulation well without complaint or incident. Will attempt to see patient later today  03/07/2021 11:45 AM

## 2021-03-07 NOTE — Progress Notes (Signed)
Pt had urge to pee continuously this morning. Pt was complaining of bladder cramps even after urinating 900 ml.  Bladder scan shows 765 ml of urine. In and Out cath removed 900 ml urine output. In and out cath relieved the cramp and discomfort. Notified Cardiology.

## 2021-03-07 NOTE — Progress Notes (Signed)
NIF -35 VC: 1.76  with good pt effort

## 2021-03-07 NOTE — Progress Notes (Signed)
Called to bedside for hypotension. PT is asymptomatic. Likely related to FTT. I have D/C'ed IV lasix due later, chart reflects diuresis of 4L today.   Would have low threshold for albumin if he becomes symptomatic.   Dr. Acie Fredrickson at bedside.

## 2021-03-08 DIAGNOSIS — D62 Acute posthemorrhagic anemia: Secondary | ICD-10-CM | POA: Diagnosis not present

## 2021-03-08 DIAGNOSIS — I5021 Acute systolic (congestive) heart failure: Secondary | ICD-10-CM

## 2021-03-08 DIAGNOSIS — I351 Nonrheumatic aortic (valve) insufficiency: Secondary | ICD-10-CM | POA: Diagnosis not present

## 2021-03-08 LAB — BASIC METABOLIC PANEL
Anion gap: 4 — ABNORMAL LOW (ref 5–15)
BUN: 11 mg/dL (ref 8–23)
CO2: 33 mmol/L — ABNORMAL HIGH (ref 22–32)
Calcium: 8 mg/dL — ABNORMAL LOW (ref 8.9–10.3)
Chloride: 98 mmol/L (ref 98–111)
Creatinine, Ser: 0.57 mg/dL — ABNORMAL LOW (ref 0.61–1.24)
GFR, Estimated: >60 mL/min (ref >60)
Glucose, Bld: 101 mg/dL — ABNORMAL HIGH (ref 70–99)
Potassium: 4 mmol/L (ref 3.5–5.1)
Sodium: 135 mmol/L (ref 135–145)

## 2021-03-08 LAB — HEMOGLOBIN AND HEMATOCRIT, BLOOD
HCT: 29.7 % — ABNORMAL LOW (ref 39.0–52.0)
Hemoglobin: 9.2 g/dL — ABNORMAL LOW (ref 13.0–17.0)

## 2021-03-08 NOTE — Progress Notes (Addendum)
PROGRESS NOTE    David [name].  [id number] DOB: 02-Aug-1935 DOA: 03/06/2021 PCP: [name], MD    Brief Narrative:  Medical consultation for myasthenia gravis  85 year old male past medical history for hypertension, dyslipidemia, coronary artery disease, myasthenia gravis, nephrolithiasis and spinal stenosis who was admitted to the hospital with a working diagnosis of decompensated heart failure.  At home patient had lower extremity edema, weight gain and dyspnea.  At the time of consultation blood pressure 148/70, heart rate 72, respiratory 20, temperature 7.8, oxygen saturation 89%, lungs with decreased breath sounds bilaterally, positive rales, heart S1-S2, present, rhythmic, abdomen soft and nontender, positive lower extremity edema bilaterally.  Sodium 136, potassium 3.2, chloride 94, bicarb 32, glucose 114 BUN 15, creatinine 0.55, BNP 1466.  White cell count 4.8, hemoglobin 9.4, hematocrit 30.8, platelets 185. SARS COVID-19 negative.  Urinalysis specific gravity 1.005. >  50 red cells, 0-5 white cells.  Chest radiograph with cardiomegaly, increased hilar vascular congestion, no infiltrates.  EKG 73 bpm, a sensed V paced, no significant ST segment or T wave changes.  Patient was consulted by GI, recommended conservative medical therapy with intravenous proton pump inhibitors.  No significant drop in his hemoglobin.  Assessment & Plan:   Principal Problem:   Acute HFrEF (heart failure with reduced ejection fraction) (HCC) Active Problems:   Aortic insufficiency   Tricuspid regurgitation   Acute blood loss anemia   Myasthenia gravis. Patient with no visual or swallowing changes, NIF and vital capacity not reduced.  Patient has been compliant with his medical regimen at home.  No cranial nerve or objective extremity weakness on physical examination.   Plan to continue medical therapy with Pyridostigmine and cellcept On desmopressin. Old records from Neurology  Dr, Felecia Shelling personally reviewed.  Ok to discontinue NIF and VC monitoring. No clinical evidence of myasthenia decompensation.  Follow as outpatient with neurology   2. Symptomatic anemia, with iron deficiency. Hgb has been stable 9.2 and Hct at 29,7. No indication for PRBC transfusion. Continue medical therapy with pantoprazole Continue with IV iron infusion   3. Acute decompensated systolic heart failure/  aortic insufficiency. Improved volume status, urine output over last 24 hrs 5,175 ml.  EF LV 20 to 25%.  Continue medical therapy with empagliflozin, spironolactone and entresto.   4. Dyslipidemia. Continue ezetimibe and simvastatin with good toleration  5. Hypokalemia. Continue Kcl supplements.     Status is: Inpatient  Remains inpatient appropriate because:Inpatient level of care appropriate due to severity of illness  Dispo: The patient is from: Home              Anticipated d/c is to: Home              Patient currently is not medically stable to d/c.   Difficult to place patient No       DVT prophylaxis: Enoxaparin   Code Status:    full  Family Communication:  I spoke with patient's son at the bedside, we talked in detail about patient's condition, plan of care and prognosis and all questions were addressed.    Subjective: Patient is feeling better, no blurry vision or double vision, no dysphagia, no upper or lower extremity weakness, no nausea or vomiting, and improved dyspnea, no chest pain.,   Objective: Vitals:   03/07/21 2305 03/08/21 0343 03/08/21 0847 03/08/21 1252  BP: 116/63 106/70 95/61 (!) 102/58  Pulse: 77 68 85 85  Resp: '18 17 18 16  '$ Temp: 98.2 F (36.8 C) (!)  97.5 F (36.4 C) 98.3 F (36.8 C)   TempSrc: Oral Oral Oral Oral  SpO2: 94% (!) 84% 96% 90%  Weight:  68.8 kg      Intake/Output Summary (Last 24 hours) at 03/08/2021 1325 Last data filed at 03/08/2021 0342 Gross per 24 hour  Intake 100 ml  Output 2275 ml  Net -2175 ml   Filed  Weights   03/06/21 2119 03/07/21 0043 03/08/21 0343  Weight: 76.5 kg 74.7 kg 68.8 kg    Examination:   General: Not in pain or dyspnea Neurology: Awake and alert, non focal  E ENT: mild pallor, no icterus, oral mucosa moist Cardiovascular: No JVD. S1-S2 present, rhythmic, no gallops, rubs, or murmurs. No lower extremity edema. Pulmonary: positive breath sounds bilaterally, with no wheezing, rhonchi or rales. Gastrointestinal. Abdomen soft and non tender Skin. No rashes Musculoskeletal: no joint deformities     Data Reviewed: I have personally reviewed following labs and imaging studies  CBC: Recent Labs  Lab 03/06/21 2209 03/07/21 1115 03/08/21 0144  WBC 4.8  --   --   HGB 9.4* 10.4* 9.2*  HCT 30.8* 34.5* 29.7*  MCV 89.5  --   --   PLT 185  --   --    Basic Metabolic Panel: Recent Labs  Lab 03/06/21 2209 03/07/21 0446 03/08/21 0144  NA 135 136 135  K 4.2 3.2* 4.0  CL 99 94* 98  CO2 32 32 33*  GLUCOSE 95 114* 101*  BUN '17 15 11  '$ CREATININE 0.52* 0.55* 0.57*  CALCIUM 8.5* 8.7* 8.0*   GFR: Estimated Creatinine Clearance: 68.1 mL/min (A) (by C-G formula based on SCr of 0.57 mg/dL (L)). Liver Function Tests: Recent Labs  Lab 03/06/21 2209  AST 33  ALT 25  ALKPHOS 56  BILITOT 1.0  PROT 5.4*  ALBUMIN 3.3*   No results for input(s): LIPASE, AMYLASE in the last 168 hours. No results for input(s): AMMONIA in the last 168 hours. Coagulation Profile: No results for input(s): INR, PROTIME in the last 168 hours. Cardiac Enzymes: No results for input(s): CKTOTAL, CKMB, CKMBINDEX, TROPONINI in the last 168 hours. BNP (last 3 results) No results for input(s): PROBNP in the last 8760 hours. HbA1C: No results for input(s): HGBA1C in the last 72 hours. CBG: No results for input(s): GLUCAP in the last 168 hours. Lipid Profile: No results for input(s): CHOL, HDL, LDLCALC, TRIG, CHOLHDL, LDLDIRECT in the last 72 hours. Thyroid Function Tests: No results for  input(s): TSH, T4TOTAL, FREET4, T3FREE, THYROIDAB in the last 72 hours. Anemia Panel: Recent Labs    03/06/21 2209  TIBC 469*  IRON 15*      Radiology Studies: I have reviewed all of the imaging during this hospital visit personally     Scheduled Meds:  Chlorhexidine Gluconate Cloth  6 each Topical Daily   desmopressin  0.1 mg Oral QHS   empagliflozin  10 mg Oral Daily   ezetimibe  10 mg Oral Daily   And   simvastatin  40 mg Oral Daily   loratadine  10 mg Oral Daily   mirabegron ER  50 mg Oral Daily   mycophenolate  500 mg Oral BID   pantoprazole (PROTONIX) IV  40 mg Intravenous Q12H   polycarbophil  625 mg Oral Daily   pyridostigmine  120 mg Oral QID   saccharomyces boulardii  250 mg Oral BID   sacubitril-valsartan  1 tablet Oral BID   sodium chloride flush  3 mL Intravenous Q12H  spironolactone  25 mg Oral Daily   tamsulosin  0.4 mg Oral Daily   Continuous Infusions:  sodium chloride       LOS: 2 days        Carola Viramontes Gerome Apley, MD

## 2021-03-08 NOTE — Progress Notes (Signed)
NIF-40 VC-1.8L

## 2021-03-08 NOTE — Plan of Care (Signed)
  Problem: Safety: Goal: Ability to remain free from injury will improve Outcome: Progressing   

## 2021-03-08 NOTE — Plan of Care (Signed)
  Problem: Pain Managment: Goal: General experience of comfort will improve Outcome: Completed/Met

## 2021-03-08 NOTE — Progress Notes (Signed)
[facility] Gastroenterology Progress Note  David [name]. 85 y.o. [birth year]  CC: Anemia, melena   Subjective: Patient seen and examined at bedside.  Discussed with RN and family at bedside.  No further episodes of melena today .patient denies any GI symptoms.  ROS : Afebrile, negative for nausea and vomiting.   Objective: Vital signs in last 24 hours: Vitals:   03/08/21 0343 03/08/21 0847  BP: 106/70 95/61  Pulse: 68 85  Resp: 17 18  Temp: (!) 97.5 F (36.4 C) 98.3 F (36.8 C)  SpO2: (!) 84% 96%    Physical Exam:  General:   Elderly patient, not in acute distress Lungs: No visible respiratory distress Heart:  Regular rate and rhythm; murmur noted  abdomen:, Nontender, nondistended, bowel sounds present.  No peritoneal sign Lower extremity edema Neuro : Alert and oriented x3 Psych : Mood and affect normal   Lab Results: Recent Labs    03/07/21 0446 03/08/21 0144  NA 136 135  K 3.2* 4.0  CL 94* 98  CO2 32 33*  GLUCOSE 114* 101*  BUN 15 11  CREATININE 0.55* 0.57*  CALCIUM 8.7* 8.0*   Recent Labs    03/06/21 2209  AST 33  ALT 25  ALKPHOS 56  BILITOT 1.0  PROT 5.4*  ALBUMIN 3.3*   Recent Labs    03/06/21 2209 03/07/21 1115 03/08/21 0144  WBC 4.8  --   --   HGB 9.4* 10.4* 9.2*  HCT 30.8* 34.5* 29.7*  MCV 89.5  --   --   PLT 185  --   --    No results for input(s): LABPROT, INR in the last 72 hours.    Assessment/Plan: -Iron deficiency anemia with intermittent melena for few months now. (More than 2 months according to patient)  occult blood positive. -Cardiomyopathy with EF of 25 to 30%.  Moderate to severe aortic insufficiency.   Recommendations ------------------------- -No further melanotic stool.  Discussed with family over the phone -Continue conservative management with IV twice daily PPI while in the hospital, switch to oral twice daily PPI on discharge for 4 weeks followed by once a daily PPI for another 4 weeks. -Avoid  NSAIDs -Advance diet to heart healthy -Okay to discharge from GI standpoint tomorrow if hemoglobin stable. -Follow-up in GI clinic in 2 months -GI will sign off.  Call us back if needed   Otis Brace MD, [place] 03/08/2021, 9:42 AM  Contact #  [phone]

## 2021-03-08 NOTE — Care Management Important Message (Signed)
Important Message  Patient Details  Name: David [name]. MRN: [id number] Date of Birth: October 17, 1935   Medicare Important Message Given:  Yes     Carolan Avedisian 03/08/2021, 4:49 PM

## 2021-03-08 NOTE — Evaluation (Signed)
Physical Therapy Evaluation Patient Details Name: David [name]. MRN: [id number] DOB: Dec 18, 1935 Today's Date: 03/08/2021   History of Present Illness  Pt is an 85 y.o. male admitted 03/06/21 with weakness, fatigue, DOE, weight gain. Workup for HF exacerbation, anemia. PMH includes myasthenia gravis, CAD, HTN, HLD, spinal stenosis.   Clinical Impression  Pt presents with an overall decrease in functional mobility secondary to above. PTA, pt mod indep ambulating with SPC, lives with wife who assists with ADL/iADLs as needed. Today, pt tolerated transfer and gait training with SPC and intermittent min guard for balance. Pt would benefit from continued acute PT services to maximize functional mobility and independence prior to d/c home.   SpO2 88-96% on RA    Follow Up Recommendations No PT follow up;Supervision for mobility/OOB (declined HHPT)    Equipment Recommendations  None recommended by PT    Recommendations for Other Services       Precautions / Restrictions Precautions Precautions: Fall Restrictions Weight Bearing Restrictions: No      Mobility  Bed Mobility Overal bed mobility: Modified Independent             General bed mobility comments: use of bed rail, momentum to power up into sitting; reports first time up today    Transfers Overall transfer level: Needs assistance Equipment used: Straight cane Transfers: Sit to/from Stand Sit to Stand: Min guard         General transfer comment: Multiple sit<>stands from EOB and recliner with SPC, min guard for balance  Ambulation/Gait Ambulation/Gait assistance: Min guard Gait Distance (Feet): 100 Feet Assistive device: Straight cane Gait Pattern/deviations: Step-through pattern;Decreased stride length Gait velocity: Decreased   General Gait Details: Slow, mildly unsteady gait with SPC and min guard for balance; stability improving with ambulation distance; 1x seated rest break secondary to fatigue and  DOE; pt declined hallway ambulation not wanting to wear mask, but tolerated multiple laps in room  Stairs            Wheelchair Mobility    Modified Rankin (Stroke Patients Only)       Balance Overall balance assessment: Needs assistance   Sitting balance-Leahy Scale: Good       Standing balance-Leahy Scale: Fair Standing balance comment: can static stand without UE support; static and dynamic stability improved with UE support                             Pertinent Vitals/Pain Pain Assessment: No/denies pain    Home Living Family/patient expects to be discharged to:: Private residence Living Arrangements: Spouse/significant other Available Help at Discharge: Family;Available 24 hours/day Type of Home: House Home Access: Stairs to enter Entrance Stairs-Rails: Right Entrance Stairs-Number of Steps: 3 Home Layout: One level Home Equipment: Cane - single point;Shower seat      Prior Function Level of Independence: Needs assistance   Gait / Transfers Assistance Needed: Mod indep ambulating with SPC; requires assist from wife on last step; wife drives. 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)  ADL's / [employer] Assistance Needed: Pt indep with dressing, able to wash body, wife assists with washing hair; wife does cooking and household tasks; pt wears depends due to bowel incontinence        Hand Dominance        Extremity/Trunk Assessment   Upper Extremity Assessment Upper Extremity Assessment: Overall WFL for tasks assessed    Lower Extremity  Assessment Lower Extremity Assessment: Generalized weakness       Communication   Communication: HOH  Cognition Arousal/Alertness: Awake/alert Behavior During Therapy: WFL for tasks assessed/performed Overall Cognitive Status: Within Functional Limits for tasks assessed                                 General Comments: WFL for simple tasks, not  formally assessed      General Comments General comments (skin integrity, edema, etc.): SpO2 down to 88% on RA with ambulation; pt reports, "My main problem is remembering to breathe" (when walking). Educ on pursed lip breathing, activity recommendations    Exercises     Assessment/Plan    PT Assessment Patient needs continued PT services  PT Problem List Decreased strength;Decreased activity tolerance;Decreased balance;Decreased mobility;Cardiopulmonary status limiting activity       PT Treatment Interventions DME instruction;Gait training;Stair training;Functional mobility training;Therapeutic activities;Therapeutic exercise;Balance training;Patient/family education    PT Goals (Current goals can be found in the Care Plan section)  Acute Rehab PT Goals Patient Stated Goal: Return home to wife PT Goal Formulation: With patient Time For Goal Achievement: 03/22/21 Potential to Achieve Goals: Good    Frequency Min 3X/week   Barriers to discharge        Co-evaluation               AM-PAC PT "6 Clicks" Mobility  Outcome Measure Help needed turning from your back to your side while in a flat bed without using bedrails?: None Help needed moving from lying on your back to sitting on the side of a flat bed without using bedrails?: A Little Help needed moving to and from a bed to a chair (including a wheelchair)?: A Little Help needed standing up from a chair using your arms (e.g., wheelchair or bedside chair)?: A Little Help needed to walk in hospital room?: A Little Help needed climbing 3-5 steps with a railing? : A Little 6 Click Score: 19    End of Session Equipment Utilized During Treatment: Gait belt Activity Tolerance: Patient tolerated treatment well Patient left: in chair;with call bell/phone within reach Nurse Communication: Mobility status PT Visit Diagnosis: Other abnormalities of gait and mobility (R26.89);Muscle weakness (generalized) (M62.81)    Time:  [id number] PT Time Calculation (min) (ACUTE ONLY): 26 min   Charges:   PT Evaluation $PT Eval Moderate Complexity: 1 Mod PT Treatments $Gait Training: 8-22 mins       Mabeline Caras, PT, DPT Acute Rehabilitation Services  Pager [phone] Office [place] 03/08/2021, 3:22 PM

## 2021-03-08 NOTE — Progress Notes (Signed)
Heart Failure Stewardship Pharmacist Progress Note   PCP: [name], MD PCP-Cardiologist: None    HPI:  85 yo M with PMH of CAD, T2DM, HLD, HTN, myasthenia gravis, OSA, LBBB, and CHB s/p PPM in 2016. He presented to [facility] for outpatient follow up. He was complaining of DOE, persistent LE edema, and orthopnea. Admitted to [facility] for IV diuresis. CXR on admission negative for CHF. Last ECHO done on 02/26/21 with LVEF 20-25% and G1DD with severely reduced RV systolic function and moderate-severe AI and dilated aortic root.  Current HF Medications: Entresto 24/26 mg BID Spironolactone 25 mg daily Jardiance 10 mg daily  Prior to admission HF Medications: Furosemide 20 mg every other day  Pertinent Lab Values: Serum creatinine 0.57, BUN 11, Potassium 4.0, Sodium 135, BNP 1466.8  Vital Signs: Weight: 151 lbs (admission weight: 164 lbs) Blood pressure: 100/70s  Heart rate: 70s   Medication Assistance / Insurance Benefits Check: Does the patient have prescription insurance?  Yes Type of insurance plan: BCBS Medicare  Does the patient qualify for medication assistance through manufacturers or grants?   Pending household income info Eligible grants and/or patient assistance programs: pending Medication assistance applications in progress: none  Medication assistance applications approved: none Approved medication assistance renewals will be completed by: pending  Outpatient Pharmacy:  Prior to admission outpatient pharmacy: [facility] Is the patient willing to use [place] pharmacy at discharge? Yes Is the patient willing to transition their outpatient pharmacy to utilize a [facility] outpatient pharmacy?   Pending    Assessment: 1. Acute on chronic systolic CHF (EF 0000000). NYHA class III symptoms. - Off IV lasix following hypotension yesterday - Caution addition of BB with myasthenia gravis - Continue Entresto 24/26 mg BID - Continue spironolactone 25 mg daily - Continue Jardiance  10 mg daily   Plan: 1) Medication changes recommended at this time: - Continue current regimen  2) Patient assistance: - Has BCBS Medicare, in coverage gap (donut hole) - Initial copays for Entresto and Jardiance are >$100 per month until out of coverage gap - Can help enroll in PAN grant vs manufacturers patient assistance to lower copays to $0 per month - HF TOC appt scheduled for 8/22  3)  Education  - To be completed prior to discharge  Kerby Nora, PharmD, BCPS Heart Failure Cytogeneticist Phone [phone]

## 2021-03-08 NOTE — Progress Notes (Addendum)
Progress Note  Patient Name: David [name]. Date of Encounter: 03/08/2021  [facility] Cardiologist: Rayann Heman / [place]  Subjective   85 year old gentleman with a history of coronary artery disease, moderate to severe aortic insufficiency, dilated ascending aorta, hypertension.  Also has myasthemia gravis.   He was admitted from the [facility] office with acute congestive heart failure.  He has had progressive shortness of breath with exertion for the past several weeks.  He was found to have heme positive stools.  He has an iron deficient anemia.  He has been seen by the GI team.  He had a significant diuresis yesterday.  He was having urinary retention and eventually had a Foley catheter placed.  Because of his brisk diuresis he had some hypotension yesterday afternoon and evening which has resolved today.  Inpatient Medications    Scheduled Meds:  Chlorhexidine Gluconate Cloth  6 each Topical Daily   desmopressin  0.1 mg Oral QHS   empagliflozin  10 mg Oral Daily   ezetimibe  10 mg Oral Daily   And   simvastatin  40 mg Oral Daily   loratadine  10 mg Oral Daily   mirabegron ER  50 mg Oral Daily   mycophenolate  500 mg Oral BID   pantoprazole (PROTONIX) IV  40 mg Intravenous Q12H   polycarbophil  625 mg Oral Daily   potassium chloride  20 mEq Oral Daily   pyridostigmine  120 mg Oral QID   saccharomyces boulardii  250 mg Oral BID   sacubitril-valsartan  1 tablet Oral BID   sodium chloride flush  3 mL Intravenous Q12H   spironolactone  25 mg Oral Daily   tamsulosin  0.4 mg Oral Daily   Continuous Infusions:  sodium chloride     PRN Meds: sodium chloride, acetaminophen, ondansetron (ZOFRAN) IV, polyvinyl alcohol, sodium chloride flush, traZODone   Vital Signs    Vitals:   03/07/21 1932 03/07/21 2305 03/08/21 0343 03/08/21 0847  BP: (!) 94/57 116/63 106/70 95/61  Pulse: 87 77 68 85  Resp: '17 18 17 18  '$ Temp: 98.1 F (36.7 C) 98.2 F (36.8 C) (!) 97.5 F  (36.4 C) 98.3 F (36.8 C)  TempSrc: Oral Oral Oral Oral  SpO2: 93% 94% (!) 84% 96%  Weight:   68.8 kg     Intake/Output Summary (Last 24 hours) at 03/08/2021 0859 Last data filed at 03/08/2021 0342 Gross per 24 hour  Intake 100 ml  Output 5175 ml  Net -5075 ml    Last 3 Weights 03/08/2021 03/07/2021 03/06/2021  Weight (lbs) 151 lb 11.2 oz 164 lb 11.2 oz 168 lb 9.6 oz  Weight (kg) 68.811 kg 74.707 kg 76.476 kg      Telemetry     V paced - Personally Reviewed  ECG    V paced - Personally Reviewed  Physical Exam   Physical Exam: Blood pressure 95/61, pulse 85, temperature 98.3 F (36.8 C), temperature source Oral, resp. rate 18, weight 68.8 kg, SpO2 96 %.  GEN:  elderly man,  generally weak  HEENT: Normal NECK: No JVD; No carotid bruits LYMPHATICS: No lymphadenopathy CARDIAC: RR . Soft diastolic murmur RESPIRATORY:  coarse rales in left base >> R base  ABDOMEN: Soft, non-tender, non-distended MUSCULOSKELETAL:  No edema; No deformity  SKIN: Warm and dry NEUROLOGIC: non focal  Psych:   may have some mild dementia .      Labs    High Sensitivity Troponin:  No results for input(s): TROPONINIHS  in the last 720 hours.    Chemistry Recent Labs  Lab 03/06/21 2209 03/07/21 0446 03/08/21 0144  NA 135 136 135  K 4.2 3.2* 4.0  CL 99 94* 98  CO2 32 32 33*  GLUCOSE 95 114* 101*  BUN '17 15 11  '$ CREATININE 0.52* 0.55* 0.57*  CALCIUM 8.5* 8.7* 8.0*  PROT 5.4*  --   --   ALBUMIN 3.3*  --   --   AST 33  --   --   ALT 25  --   --   ALKPHOS 56  --   --   BILITOT 1.0  --   --   GFRNONAA >60 >60 >60  ANIONGAP 4* 10 4*      Hematology Recent Labs  Lab 03/06/21 2209 03/07/21 1115 03/08/21 0144  WBC 4.8  --   --   RBC 3.44*  --   --   HGB 9.4* 10.4* 9.2*  HCT 30.8* 34.5* 29.7*  MCV 89.5  --   --   MCH 27.3  --   --   MCHC 30.5  --   --   RDW 15.4  --   --   PLT 185  --   --      BNP Recent Labs  Lab 03/06/21 2209  BNP 1,466.8*      DDimer No results  for input(s): DDIMER in the last 168 hours.   Radiology    US RENAL  Result Date: 03/07/2021 CLINICAL DATA:  Acute kidney injury EXAM: RENAL / URINARY TRACT ULTRASOUND COMPLETE COMPARISON:  CT 08/23/2019 FINDINGS: Right Kidney: Renal measurements: 13 x 4.8 x 4 cm = volume: 129.4 mL. Echogenicity within normal limits. No hydronephrosis. Multiple cysts, fewer than 10. The largest cyst is seen at the midpole and measures 2.9 cm. Left Kidney: Renal measurements: 14.4 x 5.9 x 4.1 cm = volume: 184.5 mL. Echogenicity within normal limits. No hydronephrosis. Multiple, fewer than 10 cysts. The largest is seen within the mid to lower pole and measures 4.7 cm. Bladder: Foley catheter in the decompressed urinary bladder Other: None. IMPRESSION: 1. Bilateral renal cysts. Otherwise negative ultrasound appearance of the kidneys. Electronically Signed   By: Donavan Foil M.D.   On: 03/07/2021 16:13   DG Chest Port 1 View  Result Date: 03/07/2021 CLINICAL DATA:  Congestive heart failure EXAM: PORTABLE CHEST 1 VIEW COMPARISON:  11/26/2020 FINDINGS: Cardiac enlargement. Dual lead pacemaker unchanged. Negative for heart failure or edema. Mild elevation right hemidiaphragm unchanged with mild right lower lobe atelectasis. No pleural effusion identified. IMPRESSION: Negative for heart failure. Mild right lower lobe atelectasis unchanged. Electronically Signed   By: Franchot Gallo M.D.   On: 03/07/2021 08:10    Cardiac Studies     Patient Profile     85 y.o. male  with generalized weakness, CHF, AI, iron deficient anemia   Assessment & Plan     1.   CHF   :  has EF 20-25%.   No evidence of CHF on CXR.  Only trace edema   Has Left sided rales on exam - cxr shows atelectasis I think this is more of a generalized failure to thrive issue  He does have reduced EF associated with aortic insufficiency . Also has a dilated aortic root. He has been diuresed 6.5 liters so far this admission .  Creatinine is stable  He  has persistent rales on his left side .  I suspect this is due to underlying lung disease and  not due to CHF  . Currently on Entresto 24-26 BID, Spiro 25 QD,  He is not currently on a loop diuretic  I have stopped his kdur   2.  Aortic insufficiency  Echo from 2016 showed mild AI Current echo shows mod - severe AI.   Also has dilatation of his aortic root.  Cont medical therapy for this .  Its unclear if he is a good candidate for AVR - he is very weak   3.  Anemia:  iron deficiency anemia .   Has been seen by GI.   They do not want to do endoscopy due to his CHF .  Medical therapy for PUD / gastritis   4.  Urinary retention :  appreciated [facility] hospitalist assistance with this   5.  Myasthemia gravis:   further eval per [facility] hospitalist     Given his numerous medical issues, I will consult the [facility] hospitalist team for help in managing his medical issues.     For questions or updates, please contact [place] Please consult [email or web address] for contact info under        Signed, Mertie Moores, MD  03/08/2021, 8:59 AM

## 2021-03-09 DIAGNOSIS — I5021 Acute systolic (congestive) heart failure: Secondary | ICD-10-CM | POA: Diagnosis not present

## 2021-03-09 LAB — HEMOGLOBIN AND HEMATOCRIT, BLOOD
HCT: 30.5 % — ABNORMAL LOW (ref 39.0–52.0)
Hemoglobin: 9.3 g/dL — ABNORMAL LOW (ref 13.0–17.0)

## 2021-03-09 LAB — BASIC METABOLIC PANEL
Anion gap: 5 (ref 5–15)
BUN: 11 mg/dL (ref 8–23)
CO2: 31 mmol/L (ref 22–32)
Calcium: 8.3 mg/dL — ABNORMAL LOW (ref 8.9–10.3)
Chloride: 99 mmol/L (ref 98–111)
Creatinine, Ser: 0.5 mg/dL — ABNORMAL LOW (ref 0.61–1.24)
GFR, Estimated: >60 mL/min (ref >60)
Glucose, Bld: 84 mg/dL (ref 70–99)
Potassium: 3.9 mmol/L (ref 3.5–5.1)
Sodium: 135 mmol/L (ref 135–145)

## 2021-03-09 MED ORDER — ENOXAPARIN SODIUM 30 MG/0.3ML IJ SOSY
30.0000 mg | PREFILLED_SYRINGE | INTRAMUSCULAR | Status: DC
  Administered 2021-03-09 – 2021-03-11 (×3): 30 mg via SUBCUTANEOUS
  Filled 2021-03-09 (×3): qty 0.3

## 2021-03-09 MED ORDER — DIGOXIN 125 MCG PO TABS
0.1250 mg | ORAL_TABLET | Freq: Every day | ORAL | Status: DC
  Administered 2021-03-09 – 2021-03-11 (×3): 0.125 mg via ORAL
  Filled 2021-03-09 (×3): qty 1

## 2021-03-09 MED ORDER — POLYETHYLENE GLYCOL 3350 17 G PO PACK
17.0000 g | PACK | Freq: Once | ORAL | Status: AC
  Administered 2021-03-09: 17 g via ORAL
  Filled 2021-03-09: qty 1

## 2021-03-09 MED ORDER — CARVEDILOL 3.125 MG PO TABS
3.1250 mg | ORAL_TABLET | Freq: Two times a day (BID) | ORAL | Status: DC
  Administered 2021-03-09: 3.125 mg via ORAL
  Filled 2021-03-09: qty 1

## 2021-03-09 NOTE — Progress Notes (Signed)
Pt. Requesting med for cough. On call for IM paged to make aware.

## 2021-03-09 NOTE — Progress Notes (Signed)
Patient is feeling better this am, no dyspnea, no diplopia or swallow dysfunction.  No weakness.  Positive constipation.  VS BP 102/71, HR 80, RR 18 and oxygenation is 92% to 96% on room air  No significant change in his neurologic examination.   Myasthenia Gravis   Plan: Continue mycophenolate, pyridostigmine and desmopressin Follow up with neurology as outpatient Will add one dose of Miralax today for constipation. Will sing off.

## 2021-03-09 NOTE — Progress Notes (Signed)
Progress Note  Patient Name: David [name]. Date of Encounter: 03/09/2021  [facility] Cardiologist: Dr Stanford Breed  Subjective   Denies CP or dyspnea  Inpatient Medications    Scheduled Meds:  Chlorhexidine Gluconate Cloth  6 each Topical Daily   desmopressin  0.1 mg Oral QHS   empagliflozin  10 mg Oral Daily   ezetimibe  10 mg Oral Daily   And   simvastatin  40 mg Oral Daily   loratadine  10 mg Oral Daily   mirabegron ER  50 mg Oral Daily   mycophenolate  500 mg Oral BID   pantoprazole (PROTONIX) IV  40 mg Intravenous Q12H   polycarbophil  625 mg Oral Daily   pyridostigmine  120 mg Oral QID   saccharomyces boulardii  250 mg Oral BID   sacubitril-valsartan  1 tablet Oral BID   sodium chloride flush  3 mL Intravenous Q12H   spironolactone  25 mg Oral Daily   tamsulosin  0.4 mg Oral Daily   Continuous Infusions:  sodium chloride     PRN Meds: sodium chloride, acetaminophen, ondansetron (ZOFRAN) IV, polyvinyl alcohol, sodium chloride flush, traZODone   Vital Signs    Vitals:   03/08/21 1641 03/08/21 1925 03/09/21 0008 03/09/21 0348  BP: 108/61 110/63 (!) 106/55 102/71  Pulse: 82 77 71 72  Resp: '18 18 18 18  '$ Temp: 97.6 F (36.4 C) 98.4 F (36.9 C) 97.9 F (36.6 C) 97.7 F (36.5 C)  TempSrc: Oral Oral Oral Oral  SpO2: 94% 96% 92% 92%  Weight:    68.5 kg    Intake/Output Summary (Last 24 hours) at 03/09/2021 0810 Last data filed at 03/09/2021 0349 Gross per 24 hour  Intake 240 ml  Output 1900 ml  Net -1660 ml   Last 3 Weights 03/09/2021 03/08/2021 03/07/2021  Weight (lbs) 151 lb 151 lb 11.2 oz 164 lb 11.2 oz  Weight (kg) 68.493 kg 68.811 kg 74.707 kg      Telemetry    Sinus with intermittent ventricular pacing- Personally Reviewed  Physical Exam   GEN: No acute distress.   Neck: No JVD Cardiac: RRR, no murmurs, rubs, or gallops.  Respiratory: Clear to auscultation bilaterally. GI: Soft, nontender, non-distended  MS: No edema Neuro:   Nonfocal  Psych: Normal affect   Labs    Chemistry Recent Labs  Lab 03/06/21 2209 03/07/21 0446 03/08/21 0144 03/09/21 0532  NA 135 136 135 135  K 4.2 3.2* 4.0 3.9  CL 99 94* 98 99  CO2 32 32 33* 31  GLUCOSE 95 114* 101* 84  BUN '17 15 11 11  '$ CREATININE 0.52* 0.55* 0.57* 0.50*  CALCIUM 8.5* 8.7* 8.0* 8.3*  PROT 5.4*  --   --   --   ALBUMIN 3.3*  --   --   --   AST 33  --   --   --   ALT 25  --   --   --   ALKPHOS 56  --   --   --   BILITOT 1.0  --   --   --   GFRNONAA >60 >60 >60 >60  ANIONGAP 4* 10 4* 5     Hematology Recent Labs  Lab 03/06/21 2209 03/07/21 1115 03/08/21 0144 03/09/21 0532  WBC 4.8  --   --   --   RBC 3.44*  --   --   --   HGB 9.4* 10.4* 9.2* 9.3*  HCT 30.8* 34.5* 29.7* 30.5*  MCV 89.5  --   --   --   MCH 27.3  --   --   --   MCHC 30.5  --   --   --   RDW 15.4  --   --   --   PLT 185  --   --   --     BNP Recent Labs  Lab 03/06/21 2209  BNP 1,466.8*     Radiology    US RENAL  Result Date: 03/07/2021 CLINICAL DATA:  Acute kidney injury EXAM: RENAL / URINARY TRACT ULTRASOUND COMPLETE COMPARISON:  CT 08/23/2019 FINDINGS: Right Kidney: Renal measurements: 13 x 4.8 x 4 cm = volume: 129.4 mL. Echogenicity within normal limits. No hydronephrosis. Multiple cysts, fewer than 10. The largest cyst is seen at the midpole and measures 2.9 cm. Left Kidney: Renal measurements: 14.4 x 5.9 x 4.1 cm = volume: 184.5 mL. Echogenicity within normal limits. No hydronephrosis. Multiple, fewer than 10 cysts. The largest is seen within the mid to lower pole and measures 4.7 cm. Bladder: Foley catheter in the decompressed urinary bladder Other: None. IMPRESSION: 1. Bilateral renal cysts. Otherwise negative ultrasound appearance of the kidneys. Electronically Signed   By: Donavan Foil M.D.   On: 03/07/2021 16:13     Patient Profile     85 y.o. male with past medical history of coronary artery disease, moderate to severe aortic insufficiency, dilated ascending  aorta, previous pacemaker, hypertension, myasthenia gravis, iron deficiency anemia admitted with acute systolic congestive heart failure.  Also with heme positive stool.  Echocardiogram February 26, 2021 showed ejection fraction 20 to 25%, global hypokinesis, mild left ventricular enlargement, mild left ventricular hypertrophy, grade 1 diastolic dysfunction, mild right ventricular enlargement, severe RV dysfunction, moderate left atrial enlargement, severe right atrial enlargement, mild mitral regurgitation, moderate tricuspid regurgitation, moderate to severe aortic insufficiency and ascending aorta measuring 45 mm.  Assessment & Plan    1 acute systolic congestive heart failure-[id number]/[id number] since admission; Wt 68.5 kg.  Volume status is much improved compared to admission.  We will continue off of Lasix for now.  Continue spironolactone.  Follow renal function.  2 cardiomyopathy-etiology unclear.  Continue Entresto, Jardiance and spironolactone.  Add digoxin 0.125 mg daily.  Add carvedilol 3.125 mg twice daily.  We will titrate medications as an outpatient.  I had considered proceeding with right and left cardiac catheterization during this admission.  However given his age and multiple medical problems I will plan to be more conservative at this point.  We will likely repeat his echocardiogram 3 months after medications fully titrated.  If LV function continues to be decreased at that time could consider ischemia evaluation though not clear he would be a good candidate for aggressive measures.  Also note that if PCI is ever required he does have heme positive stool which has not been evaluated as gastroenterology feels he is too high risk at this point.  3 iron deficient anemia/heme positive stool-continue Protonix and will transition to oral.  Add iron.  Follow hemoglobin.  4 history of moderate to severe aortic insufficiency-we will need follow-up echoes in the future.  Not clear to me that he would ever be  a candidate for AVR.  5 dilated aortic root-this could potentially be reassessed with CTA as an outpatient though again not clear he would ever be a candidate for intervention.  6 hyperlipidemia-continue statin.  7 myasthenia gravis  8 prior pacemaker  Plan to ambulate today with physical therapy.  Potential  discharge tomorrow morning if stable.  For questions or updates, please contact [place] Please consult [email or web address] for contact info under        Signed, Kirk Ruths, MD  03/09/2021, 8:10 AM

## 2021-03-09 NOTE — Plan of Care (Signed)
  Problem: Elimination: Goal: Will not experience complications related to urinary retention Outcome: Progressing   Problem: Safety: Goal: Ability to remain free from injury will improve Outcome: Progressing   

## 2021-03-10 DIAGNOSIS — I5021 Acute systolic (congestive) heart failure: Secondary | ICD-10-CM | POA: Diagnosis not present

## 2021-03-10 LAB — BASIC METABOLIC PANEL
Anion gap: 7 (ref 5–15)
BUN: 8 mg/dL (ref 8–23)
CO2: 28 mmol/L (ref 22–32)
Calcium: 8.2 mg/dL — ABNORMAL LOW (ref 8.9–10.3)
Chloride: 96 mmol/L — ABNORMAL LOW (ref 98–111)
Creatinine, Ser: 0.48 mg/dL — ABNORMAL LOW (ref 0.61–1.24)
GFR, Estimated: >60 mL/min (ref >60)
Glucose, Bld: 83 mg/dL (ref 70–99)
Potassium: 4 mmol/L (ref 3.5–5.1)
Sodium: 131 mmol/L — ABNORMAL LOW (ref 135–145)

## 2021-03-10 LAB — CBC
HCT: 30.7 % — ABNORMAL LOW (ref 39.0–52.0)
Hemoglobin: 9.4 g/dL — ABNORMAL LOW (ref 13.0–17.0)
MCH: 26.8 pg (ref 26.0–34.0)
MCHC: 30.6 g/dL (ref 30.0–36.0)
MCV: 87.5 fL (ref 80.0–100.0)
Platelets: 209 10*3/uL (ref 150–400)
RBC: 3.51 MIL/uL — ABNORMAL LOW (ref 4.22–5.81)
RDW: 15.4 % (ref 11.5–15.5)
WBC: 5.7 10*3/uL (ref 4.0–10.5)
nRBC: 0 % (ref 0.0–0.2)

## 2021-03-10 MED ORDER — FERROUS SULFATE 325 (65 FE) MG PO TABS
325.0000 mg | ORAL_TABLET | Freq: Two times a day (BID) | ORAL | Status: DC
  Administered 2021-03-10 – 2021-03-11 (×2): 325 mg via ORAL
  Filled 2021-03-10 (×2): qty 1

## 2021-03-10 MED ORDER — PANTOPRAZOLE SODIUM 40 MG PO TBEC
40.0000 mg | DELAYED_RELEASE_TABLET | Freq: Two times a day (BID) | ORAL | Status: DC
  Administered 2021-03-10 – 2021-03-11 (×2): 40 mg via ORAL
  Filled 2021-03-10 (×2): qty 1

## 2021-03-10 MED ORDER — CARVEDILOL 3.125 MG PO TABS
3.1250 mg | ORAL_TABLET | Freq: Two times a day (BID) | ORAL | Status: DC
  Administered 2021-03-11: 3.125 mg via ORAL
  Filled 2021-03-10 (×2): qty 1

## 2021-03-10 MED ORDER — PANTOPRAZOLE SODIUM 40 MG PO TBEC
40.0000 mg | DELAYED_RELEASE_TABLET | Freq: Every day | ORAL | Status: DC
  Administered 2021-03-10: 40 mg via ORAL
  Filled 2021-03-10: qty 1

## 2021-03-10 MED ORDER — CARVEDILOL 6.25 MG PO TABS: 6.2500 mg | ORAL_TABLET | Freq: Two times a day (BID) | ORAL | Status: DC

## 2021-03-10 NOTE — Progress Notes (Signed)
   Made aware by the patient's nurse his BP has been soft. Coreg was titrated to 6.'25mg'$  BID today but his AM Coreg had actually been held due to hypotension. Will reduce Coreg to 3.'125mg'$  BID and include hold parameters. He is still on Spironolactone and Entresto and will continue current dosing of those for now.   Signed, Erma Heritage, PA-C 03/10/2021, 4:27 PM Pager: [phone]

## 2021-03-10 NOTE — Progress Notes (Signed)
Progress Note  Patient Name: David [name]. Date of Encounter: 03/10/2021  [facility] Cardiologist: Dr Stanford Breed  Subjective   Pt denies CP or dyspnea  Inpatient Medications    Scheduled Meds:  carvedilol  3.125 mg Oral BID WC   Chlorhexidine Gluconate Cloth  6 each Topical Daily   desmopressin  0.1 mg Oral QHS   digoxin  0.125 mg Oral Daily   empagliflozin  10 mg Oral Daily   enoxaparin (LOVENOX) injection  30 mg Subcutaneous Q24H   ezetimibe  10 mg Oral Daily   And   simvastatin  40 mg Oral Daily   loratadine  10 mg Oral Daily   mirabegron ER  50 mg Oral Daily   mycophenolate  500 mg Oral BID   pantoprazole (PROTONIX) IV  40 mg Intravenous Q12H   polycarbophil  625 mg Oral Daily   pyridostigmine  120 mg Oral QID   saccharomyces boulardii  250 mg Oral BID   sacubitril-valsartan  1 tablet Oral BID   sodium chloride flush  3 mL Intravenous Q12H   spironolactone  25 mg Oral Daily   tamsulosin  0.4 mg Oral Daily   Continuous Infusions:  sodium chloride     PRN Meds: sodium chloride, acetaminophen, ondansetron (ZOFRAN) IV, polyvinyl alcohol, sodium chloride flush, traZODone   Vital Signs    Vitals:   03/09/21 0008 03/09/21 0348 03/09/21 1946 03/10/21 0343  BP: (!) 106/55 102/71 129/74 117/67  Pulse: 71 72 71 60  Resp: '18 18 18 18  '$ Temp: 97.9 F (36.6 C) 97.7 F (36.5 C) 97.6 F (36.4 C) 98.1 F (36.7 C)  TempSrc: Oral Oral Oral Oral  SpO2: 92% 92% 97% 93%  Weight:  68.5 kg  66.5 kg    Intake/Output Summary (Last 24 hours) at 03/10/2021 0833 Last data filed at 03/10/2021 0114 Gross per 24 hour  Intake 1180 ml  Output 3300 ml  Net -2120 ml    Last 3 Weights 03/10/2021 03/09/2021 03/08/2021  Weight (lbs) 146 lb 8 oz 151 lb 151 lb 11.2 oz  Weight (kg) 66.452 kg 68.493 kg 68.811 kg      Telemetry    Sinus with intermittent ventricular pacing- Personally Reviewed  Physical Exam   GEN: WD, frail, NAD Neck: supple Cardiac: RRR Respiratory:  CTA GI: Soft, NT ND MS: No edema Neuro:  Grossly intact Psych: Normal affect   Labs    Chemistry Recent Labs  Lab 03/06/21 2209 03/07/21 0446 03/08/21 0144 03/09/21 0532 03/10/21 0432  NA 135   < > 135 135 131*  K 4.2   < > 4.0 3.9 4.0  CL 99   < > 98 99 96*  CO2 32   < > 33* 31 28  GLUCOSE 95   < > 101* 84 83  BUN 17   < > '11 11 8  '$ CREATININE 0.52*   < > 0.57* 0.50* 0.48*  CALCIUM 8.5*   < > 8.0* 8.3* 8.2*  PROT 5.4*  --   --   --   --   ALBUMIN 3.3*  --   --   --   --   AST 33  --   --   --   --   ALT 25  --   --   --   --   ALKPHOS 56  --   --   --   --   BILITOT 1.0  --   --   --   --  GFRNONAA >60   < > >60 >60 >60  ANIONGAP 4*   < > 4* 5 7   < > = values in this interval not displayed.      Hematology Recent Labs  Lab 03/06/21 2209 03/07/21 1115 03/08/21 0144 03/09/21 0532 03/10/21 0432  WBC 4.8  --   --   --  5.7  RBC 3.44*  --   --   --  3.51*  HGB 9.4*   < > 9.2* 9.3* 9.4*  HCT 30.8*   < > 29.7* 30.5* 30.7*  MCV 89.5  --   --   --  87.5  MCH 27.3  --   --   --  26.8  MCHC 30.5  --   --   --  30.6  RDW 15.4  --   --   --  15.4  PLT 185  --   --   --  209   < > = values in this interval not displayed.     BNP Recent Labs  Lab 03/06/21 2209  BNP 1,466.8*       Patient Profile     85 y.o. male with past medical history of coronary artery disease, moderate to severe aortic insufficiency, dilated ascending aorta, previous pacemaker, hypertension, myasthenia gravis, iron deficiency anemia admitted with acute systolic congestive heart failure.  Also with heme positive stool.  Echocardiogram February 26, 2021 showed ejection fraction 20 to 25%, global hypokinesis, mild left ventricular enlargement, mild left ventricular hypertrophy, grade 1 diastolic dysfunction, mild right ventricular enlargement, severe RV dysfunction, moderate left atrial enlargement, severe right atrial enlargement, mild mitral regurgitation, moderate tricuspid regurgitation,  moderate to severe aortic insufficiency and ascending aorta measuring 45 mm.  Assessment & Plan    1 acute systolic congestive heart failure-[id number]/O-10335 since admission; Wt 66.5 kg.  Volume status improved since admission.  We will continue low-dose spironolactone.  I do not think he needs Lasix at this point.  He needs fluid restriction and low-sodium diet.  2 cardiomyopathy-etiology unclear.  Continue Entresto, Jardiance, spironolactone and digoxin 0.125 mg daily.  Increase carvedilol to 6.25 mg twice daily.  Titrate medications as an outpatient.  I had considered proceeding with right and left cardiac catheterization during this admission.  However given his age and multiple medical problems I will plan to be more conservative at this point.  We will likely repeat his echocardiogram 3 months after medications fully titrated.  If LV function continues to be decreased at that time could consider ischemia evaluation though not clear he would be a good candidate for aggressive measures.  Also note that if PCI is ever required he does have heme positive stool which has not been evaluated as gastroenterology feels he is too high risk at this point.  3 iron deficient anemia/heme positive stool-continue Protonix and will transition to oral.  Add iron.    4 history of moderate to severe aortic insufficiency-we will need follow-up echoes in the future.  Not clear to me that he would ever be a candidate for AVR.  5 dilated aortic root-this could potentially be reassessed with CTA as an outpatient though again not clear he would ever be a candidate for intervention.  6 hyperlipidemia-continue statin.  7 myasthenia gravis  8 prior pacemaker  Ambulate today.  Discharge tomorrow morning if stable.  For questions or updates, please contact [place] Please consult [email or web address] for contact info under        Signed, Kirk Ruths, MD  03/10/2021, 8:33 AM

## 2021-03-10 NOTE — Plan of Care (Signed)
  Problem: Education: Goal: Knowledge of General Education information will improve Description: Including pain rating scale, medication(s)/side effects and non-pharmacologic comfort measures Outcome: Progressing   Problem: Health Behavior/Discharge Planning: Goal: Ability to manage health-related needs will improve Outcome: Progressing   Problem: Clinical Measurements: Goal: Ability to maintain clinical measurements within normal limits will improve Outcome: Progressing Goal: Will remain free from infection Outcome: Progressing Goal: Diagnostic test results will improve Outcome: Progressing Goal: Respiratory complications will improve Outcome: Progressing Goal: Cardiovascular complication will be avoided Outcome: Progressing   Problem: Activity: Goal: Risk for activity intolerance will decrease Outcome: Progressing   Problem: Nutrition: Goal: Adequate nutrition will be maintained Outcome: Progressing   Problem: Coping: Goal: Level of anxiety will decrease Outcome: Progressing   Problem: Elimination: Goal: Will not experience complications related to bowel motility Outcome: Progressing Goal: Will not experience complications related to urinary retention Outcome: Progressing   Problem: Safety: Goal: Ability to remain free from injury will improve Outcome: Progressing   Problem: Skin Integrity: Goal: Risk for impaired skin integrity will decrease Outcome: Progressing   Problem: Education: Goal: Ability to demonstrate management of disease process will improve Outcome: Progressing Goal: Ability to verbalize understanding of medication therapies will improve Outcome: Progressing Goal: Individualized Educational Video(s) Outcome: Progressing   Problem: Activity: Goal: Capacity to carry out activities will improve Outcome: Progressing   Problem: Cardiac: Goal: Ability to achieve and maintain adequate cardiopulmonary perfusion will improve Outcome: Progressing

## 2021-03-11 ENCOUNTER — Other Ambulatory Visit (HOSPITAL_COMMUNITY): Payer: Self-pay

## 2021-03-11 ENCOUNTER — Other Ambulatory Visit: Payer: Self-pay | Admitting: Cardiology

## 2021-03-11 DIAGNOSIS — I5021 Acute systolic (congestive) heart failure: Secondary | ICD-10-CM | POA: Diagnosis not present

## 2021-03-11 DIAGNOSIS — Z79899 Other long term (current) drug therapy: Secondary | ICD-10-CM

## 2021-03-11 MED ORDER — TAMSULOSIN HCL 0.4 MG PO CAPS
0.4000 mg | ORAL_CAPSULE | Freq: Every day | ORAL | 2 refills | Status: AC
  Filled 2021-03-11: qty 60, 60d supply, fill #0

## 2021-03-11 MED ORDER — SPIRONOLACTONE 25 MG PO TABS
25.0000 mg | ORAL_TABLET | Freq: Every day | ORAL | 2 refills | Status: DC
  Filled 2021-03-11: qty 60, 60d supply, fill #0

## 2021-03-11 MED ORDER — FUROSEMIDE 20 MG PO TABS
20.0000 mg | ORAL_TABLET | ORAL | 11 refills | Status: DC | PRN
  Filled 2021-03-11: qty 30, 30d supply, fill #0

## 2021-03-11 MED ORDER — FERROUS SULFATE 325 (65 FE) MG PO TABS
325.0000 mg | ORAL_TABLET | Freq: Two times a day (BID) | ORAL | 3 refills | Status: DC
Start: 2021-03-11 — End: 2021-05-20
  Filled 2021-03-11: qty 120, 60d supply, fill #0

## 2021-03-11 MED ORDER — EMPAGLIFLOZIN 10 MG PO TABS
10.0000 mg | ORAL_TABLET | Freq: Every day | ORAL | 2 refills | Status: DC
  Filled 2021-03-11: qty 14, 14d supply, fill #0

## 2021-03-11 MED ORDER — CARVEDILOL 3.125 MG PO TABS
3.1250 mg | ORAL_TABLET | Freq: Two times a day (BID) | ORAL | 2 refills | Status: DC
  Filled 2021-03-11: qty 120, 60d supply, fill #0

## 2021-03-11 MED ORDER — ENTRESTO 24-26 MG PO TABS
1.0000 | ORAL_TABLET | Freq: Two times a day (BID) | ORAL | 2 refills | Status: DC
  Filled 2021-03-11: qty 60, 30d supply, fill #0

## 2021-03-11 MED ORDER — DIGOXIN 125 MCG PO TABS
0.1250 mg | ORAL_TABLET | Freq: Every day | ORAL | 2 refills | Status: DC
  Filled 2021-03-11: qty 60, 60d supply, fill #0

## 2021-03-11 MED ORDER — PANTOPRAZOLE SODIUM 40 MG PO TBEC
DELAYED_RELEASE_TABLET | ORAL | 2 refills | Status: DC
  Filled 2021-03-11: qty 120, 60d supply, fill #0

## 2021-03-11 NOTE — Progress Notes (Addendum)
Progress Note  Patient Name: David [name]. Date of Encounter: 03/11/2021  [facility] Cardiologist: Dr Stanford Breed  Subjective   No CP or dyspnea  Inpatient Medications    Scheduled Meds:  carvedilol  3.125 mg Oral BID WC   Chlorhexidine Gluconate Cloth  6 each Topical Daily   desmopressin  0.1 mg Oral QHS   digoxin  0.125 mg Oral Daily   empagliflozin  10 mg Oral Daily   enoxaparin (LOVENOX) injection  30 mg Subcutaneous Q24H   ezetimibe  10 mg Oral Daily   And   simvastatin  40 mg Oral Daily   ferrous sulfate  325 mg Oral BID WC   loratadine  10 mg Oral Daily   mirabegron ER  50 mg Oral Daily   mycophenolate  500 mg Oral BID   pantoprazole  40 mg Oral BID   polycarbophil  625 mg Oral Daily   pyridostigmine  120 mg Oral QID   saccharomyces boulardii  250 mg Oral BID   sacubitril-valsartan  1 tablet Oral BID   sodium chloride flush  3 mL Intravenous Q12H   spironolactone  25 mg Oral Daily   tamsulosin  0.4 mg Oral Daily   Continuous Infusions:  sodium chloride     PRN Meds: sodium chloride, acetaminophen, ondansetron (ZOFRAN) IV, polyvinyl alcohol, sodium chloride flush, traZODone   Vital Signs    Vitals:   03/10/21 1555 03/10/21 1757 03/10/21 1949 03/11/21 0505  BP: (!) 99/59 (!) 100/55 116/63 (!) 107/55  Pulse: 70 65 67 66  Resp: '18 18 18 20  '$ Temp: 98.5 F (36.9 C) 97.9 F (36.6 C) 97.9 F (36.6 C) 97.8 F (36.6 C)  TempSrc: Oral  Oral Oral  SpO2: 98% 97% 98% 94%  Weight:    65.4 kg    Intake/Output Summary (Last 24 hours) at 03/11/2021 0908 Last data filed at 03/11/2021 0532 Gross per 24 hour  Intake 360 ml  Output 1450 ml  Net -1090 ml    Last 3 Weights 03/11/2021 03/10/2021 03/09/2021  Weight (lbs) 144 lb 1.6 oz 146 lb 8 oz 151 lb  Weight (kg) 65.363 kg 66.452 kg 68.493 kg      Telemetry    Sinus with intermittent ventricular pacing; 5 beats NSVT- Personally Reviewed  Physical Exam   GEN: NAD Neck: No JVD Cardiac: RRR, no  rub Respiratory: CTA; no wheeze GI: Soft, NT ND, no masses MS: No edema Neuro:  No focal findings Psych: Normal affect   Labs    Chemistry Recent Labs  Lab 03/06/21 2209 03/07/21 0446 03/08/21 0144 03/09/21 0532 03/10/21 0432  NA 135   < > 135 135 131*  K 4.2   < > 4.0 3.9 4.0  CL 99   < > 98 99 96*  CO2 32   < > 33* 31 28  GLUCOSE 95   < > 101* 84 83  BUN 17   < > '11 11 8  '$ CREATININE 0.52*   < > 0.57* 0.50* 0.48*  CALCIUM 8.5*   < > 8.0* 8.3* 8.2*  PROT 5.4*  --   --   --   --   ALBUMIN 3.3*  --   --   --   --   AST 33  --   --   --   --   ALT 25  --   --   --   --   ALKPHOS 56  --   --   --   --  BILITOT 1.0  --   --   --   --   GFRNONAA >60   < > >60 >60 >60  ANIONGAP 4*   < > 4* 5 7   < > = values in this interval not displayed.      Hematology Recent Labs  Lab 03/06/21 2209 03/07/21 1115 03/08/21 0144 03/09/21 0532 03/10/21 0432  WBC 4.8  --   --   --  5.7  RBC 3.44*  --   --   --  3.51*  HGB 9.4*   < > 9.2* 9.3* 9.4*  HCT 30.8*   < > 29.7* 30.5* 30.7*  MCV 89.5  --   --   --  87.5  MCH 27.3  --   --   --  26.8  MCHC 30.5  --   --   --  30.6  RDW 15.4  --   --   --  15.4  PLT 185  --   --   --  209   < > = values in this interval not displayed.     BNP Recent Labs  Lab 03/06/21 2209  BNP 1,466.8*       Patient Profile     85 y.o. male with past medical history of coronary artery disease, moderate to severe aortic insufficiency, dilated ascending aorta, previous pacemaker, hypertension, myasthenia gravis, iron deficiency anemia admitted with acute systolic congestive heart failure.  Also with heme positive stool.  Echocardiogram February 26, 2021 showed ejection fraction 20 to 25%, global hypokinesis, mild left ventricular enlargement, mild left ventricular hypertrophy, grade 1 diastolic dysfunction, mild right ventricular enlargement, severe RV dysfunction, moderate left atrial enlargement, severe right atrial enlargement, mild mitral  regurgitation, moderate tricuspid regurgitation, moderate to severe aortic insufficiency and ascending aorta measuring 45 mm.  Assessment & Plan    1 acute systolic congestive heart failure-I/O-11185 since admission; Wt 65.4 kg.  Volume status has improved.  Continue spironolactone at present dose.  Will give prescription for Lasix 20 mg daily as needed for weight gain of 2 to 3 pounds or increasing lower extremity edema.  2 cardiomyopathy-etiology unclear.  Continue Entresto, carvedilol, Jardiance, spironolactone and digoxin 0.125 mg daily.  Titrate medications as an outpatient.  I had considered proceeding with right and left cardiac catheterization during this admission.  However given his age and multiple medical problems I will plan to be more conservative at this point.  We will likely repeat his echocardiogram 3 months after medications fully titrated.  If LV function continues to be decreased at that time could consider ischemia evaluation though not clear he would be a good candidate for aggressive measures.  Also note that if PCI is ever required he does have heme positive stool which has not been evaluated as gastroenterology feels he is too high risk at this point.  3 iron deficient anemia/heme positive stool-continue Protonix (he will need 40 mg twice daily for 4 weeks then 40 mg daily thereafter) . Continue iron.  He will need follow-up with gastroenterology in 2 months.  4 history of moderate to severe aortic insufficiency-we will need follow-up echoes in the future.  Not clear to me that he would ever be a candidate for AVR.  5 dilated aortic root-this could potentially be reassessed with CTA as an outpatient though again not clear he would ever be a candidate for intervention.  6 hyperlipidemia-continue statin.  7 myasthenia gravis  8 prior pacemaker  Plan discharge today.  Check potassium  and renal function in 1 week.  Follow-up APP 1 to 2 weeks.  Follow-up with me in 3  months.  Greater than 30 minutes PA and physician time. D2  For questions or updates, please contact [place] Please consult [email or web address] for contact info under        Signed, Kirk Ruths, MD  03/11/2021, 9:08 AM

## 2021-03-11 NOTE — Progress Notes (Signed)
Heart Failure Stewardship Pharmacist Progress Note  The patient was approved for a Entresto and Jardiance grant through the [employer].  BIN: [id number] PCN: [id number] ID: [id number] Group: [id number]   Approved through 03/10/22  Amount: $1,000 Phone Number: [phone]  Kerby Nora, PharmD, BCPS Heart Failure Stewardship Pharmacist Phone [phone]  Please check [email or web address] for unit-specific pharmacist phone numbers

## 2021-03-11 NOTE — TOC Benefit Eligibility Note (Signed)
Transition of Care [facility]) Benefit Eligibility Note    Patient Details  Name: David [name]. MRN: [id number] Date of Birth: Jan 02, 1936   Medication/Dose: ENTRESTO 24 MG -26 MG BID  Covered?: Yes  Tier: 3 Drug  Prescription Coverage Preferred Pharmacy: [facility] with Person/Company/Phone Number:: PEGGY  @ [employer] #  [phone]  Co-Pay: $156.36  Prior Approval: No  Deductible:  (ON DEDUCTIBLE WITH PLAN / OUT-OF-POCKET :[name] Phone Number: 03/11/2021, 3:24 PM

## 2021-03-11 NOTE — Discharge Summary (Signed)
Discharge Summary    Patient ID: David [name]. MRN: [id number]; DOB: [birth year]  Admit date: 03/06/2021 Discharge date: 03/11/2021  PCP:  [name], MD   [facility] Cardiologist:  Dr. Stanford Breed, MD   Electrophysiologist:  [name], MD  {  Discharge Diagnoses    Principal Problem:   Acute HFrEF (heart failure with reduced ejection fraction) ([place]) Active Problems:   LAD stent 2003   HTN (hypertension)   Hyperlipidemia with target LDL less than 70   S/P cardiac pacemaker procedure, placement 10/24/14 Medtronic Adapta L model ADDRL 1    CAD in native artery   Myasthenia gravis ([place])   Aortic insufficiency   Tricuspid regurgitation   Acute blood loss anemia  Diagnostic Studies/Procedures    None   History of Present Illness     David [name]. is a 85 y.o. male with CAD s/p PCI to LAD 2003, hypertension, hyperlipidemia, aortic insufficiency, tricuspid regurgitation, CHB s/p MDT PPM 2016, Myasthenia Gravis, severe spinal stenosis, hx of LBBB who presented to [facility] 03/06/21 from the office with a two week hx of chronic weakness, fatigue with associated dyspnea and DOE found to have a newly reduced LVEF at 20-25% on repeat echo performed 02/26/21. Pt was seen in follow up with Dr. Stanford Breed 03/06/21 with ongoing c/o DOE and was found to be markedly fluid volume overloaded. Given this, plan was for hospital admission fort IV diuresis.    Hospital Course   He was admitted with plans for IV Lasix '40mg'$  BID. He was started on spironolactone '25mg'$  QD along with Entresto 24/26, Jardiance, and digoxin. Carvediol was added once volume status improved. He also had complaints of dark/black stool with admission Hb found to be 9.4>>down from a baseline of 13.0. Study showed low iron saturation and elevated TIBC consistent with iron deficiency. FOBT found to be positive. GI and Internal medicine were consulted. Recommendations per GI were for conservative management for now  given recent cardiomyopathy with patient preference for non-invasive measures. He was started on Protonix with plans to follow CBC. He was also having issues with urinary retention which is chronic for him. Due to the need for diuresis, a Foley cathter was placed with significant output. Due to brisk diuresis, he subsequently had hypotension which resolved without significant issues. His Lasix was stopped due to euvolemia and he was continued on Spiro. There was a thought to pursue right and left cardiac catheterization during this admission, however given his age and multiple medical problems plan was for more conservative measures. Plan is to repeat his echocardiogram 3 months after medications fully titrated. If LV function continues to be decreased at that time could consider ischemia evaluation though not clear he would be a good candidate for aggressive measures. Also note that if PCI is ever required he does have heme positive stool which has not been evaluated as gastroenterology feels he is too high risk at this point.Due to persistently soft BPs, the patients carvedilol was decreased from 6.25 to 3.'125mg'$  BID.   Hospital problems as below:  Acute systolic congestive heart failure:  -[id number]/[id number] since admission; Wt 65.4 kg.  Volume status has improved. Continue spironolactone at present dose. Will give prescription for Lasix 20 mg daily as needed for weight gain of 2 to 3 pounds or increasing lower extremity edema.   Cardiomyopathy: -Etiology unclear. Continue Entresto, carvedilol, Jardiance, spironolactone and digoxin 0.125 mg daily. Titrate medications as an outpatient.  Considered proceeding with right and left cardiac catheterization  during this admission.  However given his age and multiple medical problems plan for more conservative measures at this point.  Repeat echocardiogram 3 months after medications fully titrated. If LV function continues to be decreased at that time could consider  ischemia evaluation though not clear he would be a good candidate for aggressive measures.  Also note that if PCI is ever required he does have heme positive stool which has not been evaluated as gastroenterology feels he is too high risk at this point.   Iron deficient anemia/heme positive stool: -Continue Protonix (he will need 40 mg twice daily for 4 weeks then 40 mg daily thereafter). Continue iron. He will need follow-up with gastroenterology in 2 months.   History of moderate to severe aortic insufficiency: -Follow echoes in the future. Not clear if he would ever be a candidate for AVR.   Dilated aortic root: -Potentially to be reassessed with CTA as an outpatient though again not clear he would ever be a candidate for intervention.   Hyperlipidemia: -Continue statin.   Myasthenia gravis   Prior pacemaker   Consultants: Internal Medicine, GI     The patient was seen and examined by Dr. Stanford Breed who feels that he is stable and ready for discharge today, 03/11/21.   Did the patient have an acute coronary syndrome (MI, NSTEMI, STEMI, etc) this admission?:  No                               Did the patient have a percutaneous coronary intervention (stent / angioplasty)?:  No.     _____________  Discharge Vitals Blood pressure (!) 107/55, pulse 66, temperature 97.8 F (36.6 C), temperature source Oral, resp. rate 20, weight 65.4 kg, SpO2 94 %.  Filed Weights   03/09/21 0348 03/10/21 0343 03/11/21 0505  Weight: 68.5 kg 66.5 kg 65.4 kg    Labs & Radiologic Studies    CBC Recent Labs    03/09/21 0532 03/10/21 0432  WBC  --  5.7  HGB 9.3* 9.4*  HCT 30.5* 30.7*  MCV  --  87.5  PLT  --  [id number]   Basic Metabolic Panel Recent Labs    03/09/21 0532 03/10/21 0432  NA 135 131*  K 3.9 4.0  CL 99 96*  CO2 31 28  GLUCOSE 84 83  BUN 11 8  CREATININE 0.50* 0.48*  CALCIUM 8.3* 8.2*   Liver Function Tests No results for input(s): AST, ALT, ALKPHOS, BILITOT, PROT, ALBUMIN in  the last 72 hours. No results for input(s): LIPASE, AMYLASE in the last 72 hours. High Sensitivity Troponin:   No results for input(s): TROPONINIHS in the last 720 hours.  BNP Invalid input(s): POCBNP D-Dimer No results for input(s): DDIMER in the last 72 hours. Hemoglobin A1C No results for input(s): HGBA1C in the last 72 hours. Fasting Lipid Panel No results for input(s): CHOL, HDL, LDLCALC, TRIG, CHOLHDL, LDLDIRECT in the last 72 hours. Thyroid Function Tests No results for input(s): TSH, T4TOTAL, T3FREE, THYROIDAB in the last 72 hours.  Invalid input(s): FREET3 _____________  US RENAL  Result Date: 03/07/2021 CLINICAL DATA:  Acute kidney injury EXAM: RENAL / URINARY TRACT ULTRASOUND COMPLETE COMPARISON:  CT 08/23/2019 FINDINGS: Right Kidney: Renal measurements: 13 x 4.8 x 4 cm = volume: 129.4 mL. Echogenicity within normal limits. No hydronephrosis. Multiple cysts, fewer than 10. The largest cyst is seen at the midpole and measures 2.9 cm. Left Kidney: Renal measurements:  14.4 x 5.9 x 4.1 cm = volume: 184.5 mL. Echogenicity within normal limits. No hydronephrosis. Multiple, fewer than 10 cysts. The largest is seen within the mid to lower pole and measures 4.7 cm. Bladder: Foley catheter in the decompressed urinary bladder Other: None. IMPRESSION: 1. Bilateral renal cysts. Otherwise negative ultrasound appearance of the kidneys. Electronically Signed   By: Donavan Foil M.D.   On: 03/07/2021 16:13   DG Chest Port 1 View  Result Date: 03/07/2021 CLINICAL DATA:  Congestive heart failure EXAM: PORTABLE CHEST 1 VIEW COMPARISON:  11/26/2020 FINDINGS: Cardiac enlargement. Dual lead pacemaker unchanged. Negative for heart failure or edema. Mild elevation right hemidiaphragm unchanged with mild right lower lobe atelectasis. No pleural effusion identified. IMPRESSION: Negative for heart failure. Mild right lower lobe atelectasis unchanged. Electronically Signed   By: Franchot Gallo M.D.   On:  03/07/2021 08:10   ECHOCARDIOGRAM COMPLETE  Result Date: 02/26/2021    ECHOCARDIOGRAM REPORT   Patient Name:   Jamaal Stager. Date of Exam: 02/26/2021 Medical Rec #:  [id number]             Height:       69.0 in Accession #:    [id number]            Weight:       167.2 lb Date of Birth:  06-20-1936             BSA:          1.915 m Patient Age:    65 years              BP:           150/84 mmHg Patient Gender: M                     HR:           79 bpm. Exam Location:  [place] Procedure: 2D Echo, Cardiac Doppler and Color Doppler Indications:    R06.02 Shortness of breath  History:        Patient has prior history of Echocardiogram examinations, most                 recent 10/23/2016. Pacemaker; Risk Factors:Hypertension, Diabetes                 and Dyslipidemia. Obstructive sleep apnea. NSVT. LBBB. Coronary                 artery disease.  Sonographer:    Wilford [employer] Rodgers-Jones RDCS Referring Phys: [place]  1. Left ventricular ejection fraction, by estimation, is 20 to 25%. The left ventricle has severely decreased function. The left ventricle demonstrates global hypokinesis. The left ventricular internal cavity size was mildly dilated. There is mild left ventricular hypertrophy. Left ventricular diastolic parameters are consistent with Grade I diastolic dysfunction (impaired relaxation).  2. Right ventricular systolic function is severely reduced. The right ventricular size is mildly enlarged.  3. Left atrial size was moderately dilated.  4. Pacer wire present. Right atrial size was severely dilated.  5. The mitral valve is normal in structure. Mild mitral valve regurgitation. No evidence of mitral stenosis.  6. Tricuspid valve regurgitation is moderate.  7. The aortic valve is tricuspid. Aortic valve regurgitation is moderate to severe. No aortic stenosis is present.  8. Aortic dilatation noted. There is moderate dilatation of the ascending aorta, measuring 45 mm.  9. The  inferior vena cava is  dilated in size with <50% respiratory variability, suggesting right atrial pressure of 15 mmHg. Comparison(s): Changes from prior study are noted. There is severe biventricular dysfunction and moderate to severe aoritc valve insuffiency with dialted ascending aorta since last study. FINDINGS  Left Ventricle: Left ventricular ejection fraction, by estimation, is 20 to 25%. The left ventricle has severely decreased function. The left ventricle demonstrates global hypokinesis. The left ventricular internal cavity size was mildly dilated. There is mild left ventricular hypertrophy. Abnormal (paradoxical) septal motion, consistent with RV pacemaker. Left ventricular diastolic parameters are consistent with Grade I diastolic dysfunction (impaired relaxation). Right Ventricle: The right ventricular size is mildly enlarged. No increase in right ventricular wall thickness. Right ventricular systolic function is severely reduced. Left Atrium: Left atrial size was moderately dilated. Right Atrium: Pacer wire present. Right atrial size was severely dilated. Pericardium: There is no evidence of pericardial effusion. Mitral Valve: The mitral valve is normal in structure. Mild mitral valve regurgitation. No evidence of mitral valve stenosis. Tricuspid Valve: The tricuspid valve is normal in structure. Tricuspid valve regurgitation is moderate . No evidence of tricuspid stenosis. Aortic Valve: The aortic valve is tricuspid. Aortic valve regurgitation is moderate to severe. Aortic regurgitation PHT measures 370 msec. No aortic stenosis is present. Pulmonic Valve: The pulmonic valve was normal in structure. Pulmonic valve regurgitation is mild. No evidence of pulmonic stenosis. Aorta: The aortic root is normal in size and structure and aortic dilatation noted. There is moderate dilatation of the ascending aorta, measuring 45 mm. Venous: The inferior vena cava is dilated in size with less than 50% respiratory  variability, suggesting right atrial pressure of 15 mmHg. IAS/Shunts: No atrial level shunt detected by color flow Doppler. Additional Comments: A device lead is visualized.  LEFT VENTRICLE PLAX 2D LVIDd:         5.80 cm  Diastology LVIDs:         4.90 cm  LV e' medial:    4.90 cm/s LV PW:         1.20 cm  LV E/e' medial:  11.4 LV IVS:        1.20 cm  LV e' lateral:   7.51 cm/s LVOT diam:     2.40 cm  LV E/e' lateral: 7.5 LV SV:         55 LV SV Index:   29 LVOT Area:     4.52 cm  RIGHT VENTRICLE            IVC RV Basal diam:  5.20 cm    IVC diam: 3.20 cm RV S prime:     9.89 cm/s TAPSE (M-mode): 1.4 cm LEFT ATRIUM             Index       RIGHT ATRIUM           Index LA diam:        5.10 cm 2.66 cm/m  RA Area:     27.00 cm LA Vol (A2C):   94.0 ml 49.10 ml/m RA Volume:   97.00 ml  50.66 ml/m LA Vol (A4C):   62.5 ml 32.64 ml/m LA Biplane Vol: 81.1 ml 42.36 ml/m  AORTIC VALVE LVOT Vmax:   71.65 cm/s LVOT Vmean:  49.850 cm/s LVOT VTI:    0.122 m AI PHT:      370 msec  AORTA Ao Root diam: 4.50 cm Ao Asc diam:  4.50 cm MITRAL VALVE  TRICUSPID VALVE MV Decel Time: 261 msec    TR Peak grad:   52.7 mmHg MV E velocity: 56.10 cm/s  TR Vmax:        363.00 cm/s MV A velocity: 66.40 cm/s MV E/A ratio:  0.84        SHUNTS                            Systemic VTI:  0.12 m                            Systemic Diam: 2.40 cm Glori Bickers MD Electronically signed by Glori Bickers MD Signature Date/Time: 02/26/2021/5:29:16 PM    Final    Disposition   Pt is being discharged home today in good condition.  Follow-up Plans & Appointments     Follow-up Information     Gastroenterology, [facility]. Schedule an appointment as soon as possible for a visit in 2 month(s).   Why: Anemia, melena,CHF with EF of 25 % Contact information: [place] [address] [phone]         Deberah Pelton, NP Follow up on 04/03/2021.   Specialty: Cardiology Why: at 8:45am Contact  information: [address] [place] [address] [place] [address] [phone]         [facility] Follow up on 03/19/2021.   Specialty: Cardiology Why: at [facility] information: [address] [facility] Follow up.   Why: rollint walker Contact information: [address] [place] [address] [phone]         [name], MD. Go on 03/12/2021.   Specialty: Internal Medicine Why: '@11'$ :00am Contact information: [address] [place] [address] [phone]         [name], MD .   Specialty: Cardiology Contact information: [place] [address] [place] [address] [phone]                Discharge Instructions     (HEART FAILURE PATIENTS) Call MD:  Anytime you have any of the following symptoms: 1) 3 pound weight gain in 24 hours or 5 pounds in 1 week 2) shortness of breath, with or without a dry hacking cough 3) swelling in the hands, feet or stomach 4) if you have to sleep on extra pillows at night in order to breathe.   Complete by: As directed    Call MD for:  difficulty breathing, headache or visual disturbances   Complete by: As directed    Call MD for:  extreme fatigue   Complete by: As directed    Call MD for:  hives   Complete by: As directed    Call MD for:  persistant dizziness or light-headedness   Complete by: As directed    Call MD for:  persistant nausea and vomiting   Complete by: As directed    Call MD for:  redness, tenderness, or signs of infection (pain, swelling, redness, odor or green/yellow discharge around incision site)   Complete by: As directed    Call MD for:  severe uncontrolled pain   Complete by: As directed    Call MD for:  temperature >100.4   Complete by: As directed    Diet - low sodium heart healthy  Complete by: As directed    Increase activity slowly   Complete by:  As directed       Discharge Medications   Allergies as of 03/11/2021       Reactions   Azithromycin Other (See Comments)   Clindamycin Hcl Other (See Comments)   Doxycycline Diarrhea   Doxycycline Hyclate Other (See Comments)   Levofloxacin Other (See Comments)   Niacin Other (See Comments)   Gris-peg [griseofulvin] Other (See Comments)   headaches   Terbinafine Other (See Comments)   headache        Medication List     TAKE these medications    aspirin EC 81 MG tablet Take 81 mg by mouth daily.   Benefiber Powd Take 2 Scoops by mouth daily.   carvedilol 3.125 MG tablet Commonly known as: COREG Take 1 tablet (3.125 mg total) by mouth 2 (two) times daily with a meal.   desmopressin 0.1 MG tablet Commonly known as: DDAVP TAKE 1 TABLET(0.1 MG) BY MOUTH AT BEDTIME What changed: See the new instructions.   digoxin 0.125 MG tablet Commonly known as: LANOXIN Take 1 tablet (0.125 mg total) by mouth daily.   empagliflozin 10 MG Tabs tablet Commonly known as: JARDIANCE Take 1 tablet (10 mg total) by mouth daily.   Ensure Take 237 mLs by mouth daily as needed (feeding suppliment).   Entresto 24-26 MG Generic drug: sacubitril-valsartan Take 1 tablet by mouth 2 (two) times daily.   ezetimibe-simvastatin 10-40 MG tablet Commonly known as: VYTORIN Take 1 tablet by mouth daily.   ferrous sulfate 325 (65 FE) MG tablet Take 1 tablet (325 mg total) by mouth 2 (two) times daily with a meal.   furosemide 20 MG tablet Commonly known as: Lasix Take 1 tablet (20 mg total) by mouth as needed for edema (for weight gain of 2-3lb and ankle edema). What changed:  when to take this reasons to take this   loratadine 10 MG tablet Commonly known as: CLARITIN Take 10 mg by mouth daily.   mycophenolate 250 MG capsule Commonly known as: CELLCEPT TAKE 2 CAPSULES BY MOUTH TWICE DAILY   Myrbetriq 50 MG Tb24 tablet Generic drug: mirabegron ER Take 50 mg by mouth daily.    nitroGLYCERIN 0.4 MG SL tablet Commonly known as: NITROSTAT Place 1 tablet (0.4 mg total) under the tongue every 5 (five) minutes as needed for chest pain. Reported on 11/01/2015   pantoprazole 40 MG tablet Commonly known as: Protonix '40mg'$  twice daily until 04/11/21, then reduce to '40mg'$  daily thereafter   pyridostigmine 60 MG tablet Commonly known as: Mestinon Take up to 5-9 pills per day What changed:  how much to take how to take this when to take this additional instructions   saccharomyces boulardii 250 MG capsule Commonly known as: FLORASTOR Take 250 mg by mouth 2 (two) times daily.   spironolactone 25 MG tablet Commonly known as: ALDACTONE Take 1 tablet (25 mg total) by mouth daily.   Systane 0.4-0.3 % Soln Generic drug: Polyethyl Glycol-Propyl Glycol Place 1 drop into both eyes daily as needed (dry eyes).   tamsulosin 0.4 MG Caps capsule Commonly known as: FLOMAX Take 1 capsule (0.4 mg total) by mouth daily. Start taking on: March 12, 2021 What changed:  when to take this reasons to take this   traZODone 50 MG tablet Commonly known as: DESYREL Take 0.5-1 tablets (25-50 mg total) by mouth at bedtime as needed for sleep.  Durable Medical Equipment  (From admission, onward)           Start     Ordered   03/11/21 1155  For home use only DME Walker rolling  Once       Question Answer Comment  Walker: With 5 Inch Wheels   Patient needs a walker to treat with the following condition Weakness      03/11/21 1154            Outstanding Labs/Studies   BMET scheduled for next week   Duration of Discharge Encounter   Greater than 30 minutes including physician time.  Signed, Kathyrn Drown, NP 03/11/2021, 12:46 PM

## 2021-03-11 NOTE — Care Management Important Message (Signed)
Important Message  Patient Details  Name: David [name]. MRN: [id number] Date of Birth: 1935/12/01   Medicare Important Message Given:  Yes     Shelda Altes 03/11/2021, 9:05 AM

## 2021-03-11 NOTE — Plan of Care (Signed)
  Problem: Education: Goal: Knowledge of General Education information will improve Description: Including pain rating scale, medication(s)/side effects and non-pharmacologic comfort measures Outcome: Adequate for Discharge   Problem: Health Behavior/Discharge Planning: Goal: Ability to manage health-related needs will improve Outcome: Adequate for Discharge   Problem: Clinical Measurements: Goal: Ability to maintain clinical measurements within normal limits will improve Outcome: Adequate for Discharge Goal: Will remain free from infection Outcome: Adequate for Discharge Goal: Diagnostic test results will improve Outcome: Adequate for Discharge Goal: Respiratory complications will improve Outcome: Adequate for Discharge Goal: Cardiovascular complication will be avoided Outcome: Adequate for Discharge   Problem: Activity: Goal: Risk for activity intolerance will decrease Outcome: Adequate for Discharge   Problem: Nutrition: Goal: Adequate nutrition will be maintained Outcome: Adequate for Discharge   Problem: Coping: Goal: Level of anxiety will decrease Outcome: Adequate for Discharge   Problem: Elimination: Goal: Will not experience complications related to bowel motility Outcome: Adequate for Discharge Goal: Will not experience complications related to urinary retention Outcome: Adequate for Discharge   Problem: Safety: Goal: Ability to remain free from injury will improve Outcome: Adequate for Discharge   Problem: Skin Integrity: Goal: Risk for impaired skin integrity will decrease Outcome: Adequate for Discharge   Problem: Education: Goal: Ability to demonstrate management of disease process will improve Outcome: Adequate for Discharge Goal: Ability to verbalize understanding of medication therapies will improve Outcome: Adequate for Discharge Goal: Individualized Educational Video(s) Outcome: Adequate for Discharge   Problem: Activity: Goal: Capacity to  carry out activities will improve Outcome: Adequate for Discharge   Problem: Cardiac: Goal: Ability to achieve and maintain adequate cardiopulmonary perfusion will improve Outcome: Adequate for Discharge   Problem: Acute Rehab PT Goals(only PT should resolve) Goal: Patient Will Transfer Sit To/From Stand Outcome: Adequate for Discharge Goal: Pt Will Ambulate Outcome: Adequate for Discharge Goal: Pt Will Go Up/Down Stairs Outcome: Adequate for Discharge

## 2021-03-11 NOTE — TOC Transition Note (Signed)
Transition of Care [facility]) - CM/SW Discharge Note   Patient Details  Name: David [name]. MRN: [id number] Date of Birth: [birth year]  [facility] CM/SW Contact:  Zenon Mayo, RN Phone Number: 03/11/2021, 12:27 PM   Clinical Narrative:    Patient is for dc today, NCM was notified patient wants a rolling walker.  NCM made referral to [facility] with [facility], this will be brought up to patient room.   Final next level of care: Home/Self Care Barriers to Discharge: No Barriers Identified   Patient Goals and CMS Choice Patient states their goals for this hospitalization and ongoing recovery are:: return home      Discharge Placement                       Discharge Plan and Services                DME Arranged: Walker rolling DME Agency: [employer] Date DME Agency Contacted: 03/11/21 Time DME Agency Contacted: 1227 Representative spoke with at DME Agency: [place]: NA          Social Determinants of Health ([place]) Interventions     Readmission Risk Interventions No flowsheet data found.

## 2021-03-11 NOTE — Progress Notes (Signed)
Heart Failure Stewardship Pharmacist Progress Note   PCP: [name], MD PCP-Cardiologist: None    HPI:  85 yo M with PMH of CAD, T2DM, HLD, HTN, myasthenia gravis, OSA, LBBB, and CHB s/p PPM in 2016. He presented to [facility] for outpatient follow up. He was complaining of DOE, persistent LE edema, and orthopnea. Admitted to [facility] for IV diuresis. CXR on admission negative for CHF. Last ECHO done on 02/26/21 with LVEF 20-25% and G1DD with severely reduced RV systolic function and moderate-severe AI and dilated aortic root.  Discharge HF Medications: Furosemide 20 mg daily PRN Carvedilol 3.125 mg BID Entresto 24/26 mg BID Spironolactone 25 mg daily Jardiance 10 mg daily Digoxin 0.125 mg daily  Prior to admission HF Medications: Furosemide 20 mg every other day  Pertinent Lab Values: As of 8/14: Serum creatinine 0.48, BUN 8, Potassium 4.0, Sodium 131, BNP 1466.8, Digoxin level due next week  Vital Signs: Weight: 144 lbs (admission weight: 164 lbs) Blood pressure: 100/60s  Heart rate: 60-70s   Medication Assistance / Insurance Benefits Check: Does the patient have prescription insurance?  Yes Type of insurance plan: BCBS Medicare  Does the patient qualify for medication assistance through manufacturers or grants?   Pending household income info Eligible grants and/or patient assistance programs: pending Medication assistance applications in progress: none  Medication assistance applications approved: none Approved medication assistance renewals will be completed by: pending  Outpatient Pharmacy:  Prior to admission outpatient pharmacy: [facility] Is the patient willing to use [facility] at discharge? Yes Is the patient willing to transition their outpatient pharmacy to utilize a [facility] outpatient pharmacy?   Pending    Assessment: 1. Acute on chronic systolic CHF (EF 0000000). NYHA class III symptoms. - Continue furosemide 20 mg daily PRN at discharge - Caution  carvedilol with myasthenia gravis. Dose reduced to 3.125 mg BID with hypotension. - Continue Entresto 24/26 mg BID - Continue spironolactone 25 mg daily - Continue Jardiance 10 mg daily - Continue digoxin 0.125 mg daily  Plan: 1) Medication changes recommended at this time: - Continue current regimen  2) Patient assistance: - Has BCBS Medicare, in coverage gap (donut hole) - Initial copays for Entresto and Jardiance are >$100 per month until out of coverage gap - Can help enroll in PAN grant vs manufacturers patient assistance to lower copays to $0 per month - HF TOC appt scheduled for 8/22  3)  Education  - Patient has been educated on current HF medications and potential additions to HF medication regimen - Patient verbalizes understanding that over the next few months, these medication doses may change and more medications may be added to optimize HF regimen - Patient has been educated on basic disease state pathophysiology and goals of therapy    Kerby Nora, PharmD, BCPS Heart Failure Cytogeneticist Phone [phone]

## 2021-03-12 ENCOUNTER — Telehealth: Payer: Self-pay | Admitting: *Deleted

## 2021-03-12 NOTE — Telephone Encounter (Signed)
Patient was approved by the [employer] foundation for $1000.00 for entresto and jardiance.

## 2021-03-12 NOTE — Progress Notes (Signed)
[facility]  QL:[id number], [name] Primary Cardiologist: [name]  HPI:  David Bane Obra Burckhard. is a 85 y.o.  male  with a PMH significant for CAD s/p PCI to LAD 2003, hypertension, hyperlipidemia, aortic insufficiency, tricuspid regurgitation, CHB s/p MDT PM 2016 , Myasthenia Gravis, severe spinal stenosis, LBBB  Cardiac history began in 2003 where he required PCI to the LAD.  I am unable to view cath results or notes from that time.  He did have low risk Myoview studies afterwards in 2012 and 2016.  2016 developed complete heart block requiring PPM placement.  2012 echo EF 50 to 55%, mild AI, normal RV. 2016 echo EF 55 to 123456, grade 1 diastolic dysfunction, mild AI.  Aortic root 43 mm in diameter.  PA peak pressure estimation 43 mmHg  Managed primarily in the outpatient setting.  He started to notice increased lower extremity swelling 01/2021.  He did not want to go on diuretic at that time due to BPH symptoms.  He had a follow-up visit 03/06/2021 and at that point developed significant dyspnea on exertion and orthopnea.  Prior to the visit he had an echo completed. Echo 02/26/2021 EF 20 to 25%, mildly dilated LV, mild LVH, grade 1 diastolic dysfunction, severely reduced RV function, moderate AI, moderate dilation of ascending aorta 45 mm in diameter, IVC dilated with less than 50% respiratory variability.    He was admitted with plans for right and left heart cath to work-up the etiology of this new systolic dysfunction further.  On admission he did report dark stools.  His hemoglobin was 9.4 which is well below his baseline of 13, BNP was 1466, iron studies consistent with severely low iron.  He was started on IV diuretics.  GI was consulted.  Felt that this could be from gastritis or peptic ulcer disease.  Given his cardiomyopathy he was started on PPI and no endoscopic procedures were pursued.  Given this, the patient's age and comorbidities, an  ischemic evaluation was deferred for the time being.  He was diuresed from 168 pounds to 144.  GDMT was titrated.  Ultimately discharged on: Coreg 3.125 BID, Jardiance 10, Entresto 24/26, Spiro 25, Lasix 20 PRN, digoxin 0.125.    Feeling a lot better since leaving the hospital.  A lot less short of breath when walking around the house.  Went to his insurance office on Saturday and worked for an hour.  Denies any further dark stools they are a lot lighter now.  Not checking bp at home.  Weight has been stable at 141lbs.    ROS: All systems negative except as listed in HPI, PMH and Problem List.  SH:  Social History   Socioeconomic History   Marital status: Married    Spouse name: Not on file   Number of children: Not on file   Years of education: Not on file   Highest education level: Not on file  Occupational History   Not on file  Tobacco Use   Smoking status: Former    Types: Cigarettes, Cigars    Start date: 04/28/1949    Quit date: 10/27/1982    Years since quitting: 38.4   Smokeless tobacco: Never  Vaping Use   Vaping Use: Never used  Substance and Sexual Activity   Alcohol use: Not Currently    Comment: 1-2 drink per week   Drug use: No   Sexual activity: Not on file  Other Topics Concern   Not  on file  Social History Narrative   Not on file   Social Determinants of Health   Financial Resource Strain: Not on file  Food Insecurity: Not on file  Transportation Needs: Not on file  Physical Activity: Not on file  Stress: Not on file  Social Connections: Not on file  Intimate Partner Violence: Not on file    FH:  Family History  Problem Relation Age of Onset   Breast cancer Mother    Colon cancer Father    Lung cancer Father     Past Medical History:  Diagnosis Date   CAD (coronary artery disease)    previous stent to LAD 2003   Diabetes mellitus without complication ([place])    No longer taking diabetic medications at this time   History of complete heart  block    History of kidney stones    Hyperlipidemia    Hypertension    LBBB (left bundle branch block)    Melanoma (HCC)    Myasthenia gravis (HCC)    eyelids, finger, and toes   NSVT (nonsustained ventricular tachycardia) (HCC)    OSA (obstructive sleep apnea)    Does not wear CPAP.   Presence of permanent cardiac pacemaker    S/P cardiac pacemaker procedure, placement 10/24/14 Medtronic Adapta L model ADDRL 1  10/25/2014   Spinal stenosis    Severe    Current Outpatient Medications  Medication Sig Dispense Refill   aspirin EC 81 MG tablet Take 81 mg by mouth daily.      carvedilol (COREG) 3.125 MG tablet Take 1 tablet (3.125 mg total) by mouth 2 (two) times daily with a meal. 120 tablet 2   desmopressin (DDAVP) 0.1 MG tablet TAKE 1 TABLET(0.1 MG) BY MOUTH AT BEDTIME (Patient taking differently: Take 0.1 mg by mouth at bedtime.) 30 tablet 5   digoxin (LANOXIN) 0.125 MG tablet Take 1 tablet (0.125 mg total) by mouth daily. 60 tablet 2   empagliflozin (JARDIANCE) 10 MG TABS tablet Take 1 tablet (10 mg total) by mouth daily. 60 tablet 2   Ensure (ENSURE) Take 237 mLs by mouth daily as needed (feeding suppliment).     ezetimibe-simvastatin (VYTORIN) 10-40 MG tablet Take 1 tablet by mouth daily.     ferrous sulfate 325 (65 FE) MG tablet Take 1 tablet (325 mg total) by mouth 2 (two) times daily with a meal. 120 tablet 3   furosemide (LASIX) 20 MG tablet Take 1 tablet (20 mg total) by mouth as needed for edema (for weight gain of 2-3lb and ankle edema). 30 tablet 11   loratadine (CLARITIN) 10 MG tablet Take 10 mg by mouth daily.     mycophenolate (CELLCEPT) 250 MG capsule TAKE 2 CAPSULES BY MOUTH TWICE DAILY (Patient taking differently: Take 500 mg by mouth 2 (two) times daily.) 120 capsule 5   MYRBETRIQ 50 MG TB24 tablet Take 50 mg by mouth daily.     nitroGLYCERIN (NITROSTAT) 0.4 MG SL tablet Place 1 tablet (0.4 mg total) under the tongue every 5 (five) minutes as needed for chest pain.  Reported on 11/01/2015 25 tablet 1   pantoprazole (PROTONIX) 40 MG tablet '40mg'$  twice daily until 04/11/21, then reduce to '40mg'$  daily thereafter 120 tablet 2   Polyethyl Glycol-Propyl Glycol (SYSTANE) 0.4-0.3 % SOLN Place 1 drop into both eyes daily as needed (dry eyes).     pyridostigmine (MESTINON) 60 MG tablet Take 2 tablets by mouth four times daily 720 tablet 3   saccharomyces boulardii (FLORASTOR)  250 MG capsule Take 250 mg by mouth 2 (two) times daily.     sacubitril-valsartan (ENTRESTO) 24-26 MG Take 1 tablet by mouth 2 (two) times daily. 120 tablet 2   spironolactone (ALDACTONE) 25 MG tablet Take 1 tablet (25 mg total) by mouth daily. 60 tablet 2   tamsulosin (FLOMAX) 0.4 MG CAPS capsule Take 1 capsule (0.4 mg total) by mouth daily. 60 capsule 2   traZODone (DESYREL) 50 MG tablet Take 0.5-1 tablets (25-50 mg total) by mouth at bedtime as needed for sleep. 30 tablet 5   Wheat Dextrin (BENEFIBER) POWD Take 2 Scoops by mouth daily.     No current facility-administered medications for this encounter.    Vitals:   03/18/21 1020  BP: (!) 102/58  Pulse: 60  SpO2: 92%  Weight: 64.5 kg (142 lb 3.2 oz)    PHYSICAL EXAM: Cardiac: JVD flat, normal rate and rhythm, clear s1 and s2, no murmurs, rubs or gallops, no LE edema Pulmonary: CTAB, not in distress Abdominal: non distended abdomen, soft and nontender Psych: Alert, conversant, in good spirits   ASSESSMENT & PLAN: HFrEF -2003 where he required PCI to the LAD.  I am unable to view cath results or notes from that time.  He did have low risk Myoview studies afterwards in 2012 and 2016. -2012 echo EF 50 to 55%, mild AI, normal RV. 2016 echo EF 55 to 123456, grade 1 diastolic dysfunction, mild AI.  Aortic root 43 mm in diameter.  PA peak pressure estimation 43 mmHg -Echo 02/26/2021 EF 20 to 25%, mildly dilated LV, mild LVH, grade 1 diastolic dysfunction, severely reduced RV function, moderate AI, moderate dilation of ascending aorta 45 mm in  diameter, IVC dilated with less than 50% respiratory variability.  -Uncertain etiology, ischemic eval limited by GI bleed during admission -NYHA Class II-III, mobility limited currently so a bit hard to assess, euvolemic on exam. -Continue Coreg 3.125 BID, Jardiance 10, Entresto 24/26, Spiro 25, Lasix 20 PRN, digoxin 0.125 -no bp room for titration, he will begin checking bp at home -primary cardiology team planning for repeat echo after optimal GDMT trial and then will discuss possible PCI if needed -took digoxin before arrival can't get level, get BMP  CHB  -s/p MDT PM 2016  Iron Deficiency anemia GI blood loss anemia -rec'd one dose of IV feraheme inpatient -denies further dark stools, repeat CBC -order second dose of IV iron he can come to short stay at his convenience  Dilated Aortic Root Moderate to Severe AI -primary cardiology team planning for outpatient CTA of aorta -repeat ECHO after GDMT optimization, AI will likely improve    Follow up w/ [facility]

## 2021-03-14 ENCOUNTER — Telehealth: Payer: Self-pay | Admitting: Neurology

## 2021-03-14 MED ORDER — PYRIDOSTIGMINE BROMIDE 60 MG PO TABS: ORAL_TABLET | ORAL | 3 refills | Status: DC

## 2021-03-14 NOTE — Telephone Encounter (Signed)
Called pt back. He was at [facility]/admitted last week from 03/06/21-03/11/21 for Acute HFrEF (heart failure with reduced ejection fraction.  They were giving him pyridostigmine '60mg'$ , 2 tabs po q 4 hours per pt. He normally takes '60mg'$ , 1 pill every 2 hours. He liked how the hospital was giving him the medication and wondering if he can continue this regimen. Advised I will discuss w/ MD and call back.

## 2021-03-14 NOTE — Telephone Encounter (Signed)
Pt called stating he was in the ED and while he was admitted he was given his pyridostigmine (MESTINON) 60 MG tablet but it was in a different format and also given to him at a different time than when he normally takes it at home. Pt wants to know what should he do, requesting a call back.

## 2021-03-14 NOTE — Telephone Encounter (Signed)
Called pt. Advised Dr. Felecia Shelling approved changing rx to mestinon '60mg'$ , 2 tabs po QID. #720, 3 refills. E[facility]scribed to [facility] per pt request. Pt aware to call back if any further questions arise.

## 2021-03-18 ENCOUNTER — Encounter (HOSPITAL_COMMUNITY): Payer: Self-pay

## 2021-03-18 ENCOUNTER — Telehealth (HOSPITAL_COMMUNITY): Payer: Self-pay | Admitting: Surgery

## 2021-03-18 ENCOUNTER — Ambulatory Visit (INDEPENDENT_AMBULATORY_CARE_PROVIDER_SITE_OTHER): Payer: Medicare Other

## 2021-03-18 ENCOUNTER — Telehealth (HOSPITAL_COMMUNITY): Payer: Self-pay

## 2021-03-18 ENCOUNTER — Other Ambulatory Visit: Payer: Self-pay

## 2021-03-18 ENCOUNTER — Ambulatory Visit (HOSPITAL_COMMUNITY)
Admit: 2021-03-18 | Discharge: 2021-03-18 | Disposition: A | Discharge status: Home / Self Care | Payer: Medicare Other | Source: Ambulatory Visit | Attending: Internal Medicine | Admitting: Internal Medicine

## 2021-03-18 VITALS — BP 102/58 | HR 60 | Wt 142.2 lb

## 2021-03-18 DIAGNOSIS — I110 Hypertensive heart disease with heart failure: Secondary | ICD-10-CM | POA: Insufficient documentation

## 2021-03-18 DIAGNOSIS — Z79899 Other long term (current) drug therapy: Secondary | ICD-10-CM | POA: Diagnosis not present

## 2021-03-18 DIAGNOSIS — D509 Iron deficiency anemia, unspecified: Secondary | ICD-10-CM | POA: Diagnosis not present

## 2021-03-18 DIAGNOSIS — Z87891 Personal history of nicotine dependence: Secondary | ICD-10-CM | POA: Diagnosis not present

## 2021-03-18 DIAGNOSIS — E785 Hyperlipidemia, unspecified: Secondary | ICD-10-CM | POA: Diagnosis not present

## 2021-03-18 DIAGNOSIS — Z7982 Long term (current) use of aspirin: Secondary | ICD-10-CM | POA: Diagnosis not present

## 2021-03-18 DIAGNOSIS — R0602 Shortness of breath: Secondary | ICD-10-CM | POA: Diagnosis not present

## 2021-03-18 DIAGNOSIS — D500 Iron deficiency anemia secondary to blood loss (chronic): Secondary | ICD-10-CM

## 2021-03-18 DIAGNOSIS — I082 Rheumatic disorders of both aortic and tricuspid valves: Secondary | ICD-10-CM | POA: Insufficient documentation

## 2021-03-18 DIAGNOSIS — I442 Atrioventricular block, complete: Secondary | ICD-10-CM

## 2021-03-18 DIAGNOSIS — I2510 Atherosclerotic heart disease of native coronary artery without angina pectoris: Secondary | ICD-10-CM | POA: Diagnosis not present

## 2021-03-18 DIAGNOSIS — Z8679 Personal history of other diseases of the circulatory system: Secondary | ICD-10-CM | POA: Insufficient documentation

## 2021-03-18 DIAGNOSIS — G7000 Myasthenia gravis without (acute) exacerbation: Secondary | ICD-10-CM | POA: Diagnosis not present

## 2021-03-18 DIAGNOSIS — Z7901 Long term (current) use of anticoagulants: Secondary | ICD-10-CM | POA: Insufficient documentation

## 2021-03-18 DIAGNOSIS — I429 Cardiomyopathy, unspecified: Secondary | ICD-10-CM | POA: Insufficient documentation

## 2021-03-18 DIAGNOSIS — I351 Nonrheumatic aortic (valve) insufficiency: Secondary | ICD-10-CM | POA: Diagnosis not present

## 2021-03-18 DIAGNOSIS — I5022 Chronic systolic (congestive) heart failure: Secondary | ICD-10-CM

## 2021-03-18 LAB — CBC
HCT: 37.7 % — ABNORMAL LOW (ref 39.0–52.0)
Hemoglobin: 11.4 g/dL — ABNORMAL LOW (ref 13.0–17.0)
MCH: 27.6 pg (ref 26.0–34.0)
MCHC: 30.2 g/dL (ref 30.0–36.0)
MCV: 91.3 fL (ref 80.0–100.0)
Platelets: 294 10*3/uL (ref 150–400)
RBC: 4.13 MIL/uL — ABNORMAL LOW (ref 4.22–5.81)
RDW: 17.2 % — ABNORMAL HIGH (ref 11.5–15.5)
WBC: 5 10*3/uL (ref 4.0–10.5)
nRBC: 0 % (ref 0.0–0.2)

## 2021-03-18 LAB — BASIC METABOLIC PANEL
Anion gap: 6 (ref 5–15)
BUN: 10 mg/dL (ref 8–23)
CO2: 30 mmol/L (ref 22–32)
Calcium: 8.5 mg/dL — ABNORMAL LOW (ref 8.9–10.3)
Chloride: 96 mmol/L — ABNORMAL LOW (ref 98–111)
Creatinine, Ser: 0.6 mg/dL — ABNORMAL LOW (ref 0.61–1.24)
GFR, Estimated: >60 mL/min (ref >60)
Glucose, Bld: 104 mg/dL — ABNORMAL HIGH (ref 70–99)
Potassium: 3.9 mmol/L (ref 3.5–5.1)
Sodium: 132 mmol/L — ABNORMAL LOW (ref 135–145)

## 2021-03-18 NOTE — Telephone Encounter (Signed)
Called to confirm Heart & Vascular Transitions of Care appointment at [place]. Patient reminded to bring all medications and pill box organizer with them. Confirmed patient has transportation. Gave directions, instructed to utilize [address] parking.  Confirmed appointment prior to ending call.   Pricilla Holm, MSN, RN Heart Failure Nurse Navigator [phone]

## 2021-03-18 NOTE — Patient Instructions (Signed)
Labwork today--we will only call with abnormal results. Infusion Center--[phone] to schedule IV iron infusion.

## 2021-03-18 NOTE — Telephone Encounter (Signed)
I called patient and let him know that his labs were stable.  I explained that per Dr. Shan Levans he and his wife still needed to make his appt to have an Iron infusion scheduled.  He acknowledges understanding and will relay this message to his wife.

## 2021-03-19 ENCOUNTER — Other Ambulatory Visit: Payer: Medicare Other

## 2021-03-19 LAB — CUP PACEART REMOTE DEVICE CHECK
Battery Impedance: 689 Ohm
Battery Remaining Longevity: 75 mo
Battery Voltage: 2.77 V
Brady Statistic AP VP Percent: 9 %
Brady Statistic AP VS Percent: 0 %
Brady Statistic AS VP Percent: 73 %
Brady Statistic AS VS Percent: 18 %
Date Time Interrogation Session: 20220823113239
Implantable Lead Implant Date: 20160329
Implantable Lead Implant Date: 20160329
Implantable Lead Location: 753859
Implantable Lead Location: 753860
Implantable Lead Model: 5076
Implantable Lead Model: 5092
Implantable Pulse Generator Implant Date: 20160329
Lead Channel Impedance Value: 392 Ohm
Lead Channel Impedance Value: 586 Ohm
Lead Channel Pacing Threshold Amplitude: 0.5 V
Lead Channel Pacing Threshold Amplitude: 0.875 V
Lead Channel Pacing Threshold Pulse Width: 0.4 ms
Lead Channel Pacing Threshold Pulse Width: 0.4 ms
Lead Channel Setting Pacing Amplitude: 2 V
Lead Channel Setting Pacing Amplitude: 2.5 V
Lead Channel Setting Pacing Pulse Width: 0.4 ms
Lead Channel Setting Sensing Sensitivity: 2.8 mV

## 2021-03-21 ENCOUNTER — Encounter (HOSPITAL_COMMUNITY): Payer: Self-pay

## 2021-03-21 ENCOUNTER — Other Ambulatory Visit: Payer: Self-pay

## 2021-03-21 ENCOUNTER — Observation Stay (HOSPITAL_COMMUNITY)
Admission: EM | Admit: 2021-03-21 | Discharge: 2021-03-22 | Disposition: A | Discharge status: Home / Self Care | Payer: Medicare Other | Attending: Internal Medicine | Admitting: Internal Medicine

## 2021-03-21 ENCOUNTER — Emergency Department (HOSPITAL_COMMUNITY): Payer: Medicare Other

## 2021-03-21 DIAGNOSIS — I5042 Chronic combined systolic (congestive) and diastolic (congestive) heart failure: Secondary | ICD-10-CM | POA: Diagnosis not present

## 2021-03-21 DIAGNOSIS — G4733 Obstructive sleep apnea (adult) (pediatric): Secondary | ICD-10-CM | POA: Diagnosis not present

## 2021-03-21 DIAGNOSIS — I517 Cardiomegaly: Secondary | ICD-10-CM | POA: Diagnosis not present

## 2021-03-21 DIAGNOSIS — Z7982 Long term (current) use of aspirin: Secondary | ICD-10-CM | POA: Diagnosis not present

## 2021-03-21 DIAGNOSIS — E119 Type 2 diabetes mellitus without complications: Secondary | ICD-10-CM | POA: Insufficient documentation

## 2021-03-21 DIAGNOSIS — I951 Orthostatic hypotension: Secondary | ICD-10-CM | POA: Diagnosis not present

## 2021-03-21 DIAGNOSIS — I959 Hypotension, unspecified: Secondary | ICD-10-CM | POA: Diagnosis not present

## 2021-03-21 DIAGNOSIS — G7000 Myasthenia gravis without (acute) exacerbation: Secondary | ICD-10-CM

## 2021-03-21 DIAGNOSIS — I501 Left ventricular failure: Secondary | ICD-10-CM | POA: Diagnosis not present

## 2021-03-21 DIAGNOSIS — D509 Iron deficiency anemia, unspecified: Secondary | ICD-10-CM | POA: Diagnosis not present

## 2021-03-21 DIAGNOSIS — I5022 Chronic systolic (congestive) heart failure: Secondary | ICD-10-CM | POA: Diagnosis not present

## 2021-03-21 DIAGNOSIS — I442 Atrioventricular block, complete: Secondary | ICD-10-CM | POA: Diagnosis not present

## 2021-03-21 DIAGNOSIS — Z79899 Other long term (current) drug therapy: Secondary | ICD-10-CM | POA: Diagnosis not present

## 2021-03-21 DIAGNOSIS — I351 Nonrheumatic aortic (valve) insufficiency: Secondary | ICD-10-CM | POA: Diagnosis not present

## 2021-03-21 DIAGNOSIS — Z950 Presence of cardiac pacemaker: Secondary | ICD-10-CM | POA: Diagnosis not present

## 2021-03-21 DIAGNOSIS — I10 Essential (primary) hypertension: Secondary | ICD-10-CM | POA: Insufficient documentation

## 2021-03-21 DIAGNOSIS — I2510 Atherosclerotic heart disease of native coronary artery without angina pectoris: Secondary | ICD-10-CM | POA: Insufficient documentation

## 2021-03-21 DIAGNOSIS — I712 Thoracic aortic aneurysm, without rupture: Secondary | ICD-10-CM

## 2021-03-21 DIAGNOSIS — Z955 Presence of coronary angioplasty implant and graft: Secondary | ICD-10-CM | POA: Insufficient documentation

## 2021-03-21 DIAGNOSIS — Z87891 Personal history of nicotine dependence: Secondary | ICD-10-CM | POA: Diagnosis not present

## 2021-03-21 HISTORY — DX: Nonrheumatic aortic (valve) insufficiency: I35.1

## 2021-03-21 HISTORY — DX: Iron deficiency anemia, unspecified: D50.9

## 2021-03-21 HISTORY — DX: Rheumatic tricuspid insufficiency: I07.1

## 2021-03-21 HISTORY — DX: Gastrointestinal hemorrhage, unspecified: K92.2

## 2021-03-21 HISTORY — DX: Thoracic aortic ectasia: I77.810

## 2021-03-21 HISTORY — DX: Other abnormalities of gait and mobility: R26.89

## 2021-03-21 HISTORY — DX: Unspecified systolic (congestive) heart failure: I50.20

## 2021-03-21 LAB — COMPREHENSIVE METABOLIC PANEL
ALT: 23 U/L (ref 0–44)
AST: 25 U/L (ref 15–41)
Albumin: 3.2 g/dL — ABNORMAL LOW (ref 3.5–5.0)
Alkaline Phosphatase: 59 U/L (ref 38–126)
Anion gap: 4 — ABNORMAL LOW (ref 5–15)
BUN: 9 mg/dL (ref 8–23)
CO2: 30 mmol/L (ref 22–32)
Calcium: 8.5 mg/dL — ABNORMAL LOW (ref 8.9–10.3)
Chloride: 99 mmol/L (ref 98–111)
Creatinine, Ser: 0.57 mg/dL — ABNORMAL LOW (ref 0.61–1.24)
GFR, Estimated: >60 mL/min (ref >60)
Glucose, Bld: 88 mg/dL (ref 70–99)
Potassium: 4 mmol/L (ref 3.5–5.1)
Sodium: 133 mmol/L — ABNORMAL LOW (ref 135–145)
Total Bilirubin: 1 mg/dL (ref 0.3–1.2)
Total Protein: 5.6 g/dL — ABNORMAL LOW (ref 6.5–8.1)

## 2021-03-21 LAB — URINALYSIS, ROUTINE W REFLEX MICROSCOPIC
Bacteria, UA: NONE SEEN
Bilirubin Urine: NEGATIVE
Glucose, UA: >=500 mg/dL — AB
Hgb urine dipstick: NEGATIVE
Ketones, ur: NEGATIVE mg/dL
Leukocytes,Ua: NEGATIVE
Nitrite: NEGATIVE
Protein, ur: NEGATIVE mg/dL
Specific Gravity, Urine: 1.006 (ref 1.005–1.030)
pH: 7 (ref 5.0–8.0)

## 2021-03-21 LAB — CBC WITH DIFFERENTIAL/PLATELET
Abs Immature Granulocytes: 0.02 10*3/uL (ref 0.00–0.07)
Basophils Absolute: 0 10*3/uL (ref 0.0–0.1)
Basophils Relative: 0 %
Eosinophils Absolute: 0 10*3/uL (ref 0.0–0.5)
Eosinophils Relative: 1 %
HCT: 35 % — ABNORMAL LOW (ref 39.0–52.0)
Hemoglobin: 10.7 g/dL — ABNORMAL LOW (ref 13.0–17.0)
Immature Granulocytes: 0 %
Lymphocytes Relative: 25 %
Lymphs Abs: 1.2 10*3/uL (ref 0.7–4.0)
MCH: 28.1 pg (ref 26.0–34.0)
MCHC: 30.6 g/dL (ref 30.0–36.0)
MCV: 91.9 fL (ref 80.0–100.0)
Monocytes Absolute: 0.5 10*3/uL (ref 0.1–1.0)
Monocytes Relative: 11 %
Neutro Abs: 3.1 10*3/uL (ref 1.7–7.7)
Neutrophils Relative %: 63 %
Platelets: 208 10*3/uL (ref 150–400)
RBC: 3.81 MIL/uL — ABNORMAL LOW (ref 4.22–5.81)
RDW: 17.7 % — ABNORMAL HIGH (ref 11.5–15.5)
WBC: 4.9 10*3/uL (ref 4.0–10.5)
nRBC: 0 % (ref 0.0–0.2)

## 2021-03-21 LAB — GLUCOSE, CAPILLARY: Glucose-Capillary: 181 mg/dL — ABNORMAL HIGH (ref 70–99)

## 2021-03-21 LAB — TROPONIN I (HIGH SENSITIVITY)
Troponin I (High Sensitivity): 19 ng/L — ABNORMAL HIGH (ref <18)
Troponin I (High Sensitivity): 18 ng/L — ABNORMAL HIGH (ref <18)

## 2021-03-21 LAB — LACTIC ACID, PLASMA: Lactic Acid, Venous: 1.5 mmol/L (ref 0.5–1.9)

## 2021-03-21 MED ORDER — MYCOPHENOLATE MOFETIL 250 MG PO CAPS: 500.0000 mg | ORAL_CAPSULE | Freq: Two times a day (BID) | ORAL | Status: DC

## 2021-03-21 MED ORDER — FERROUS SULFATE 325 (65 FE) MG PO TABS
325.0000 mg | ORAL_TABLET | Freq: Two times a day (BID) | ORAL | Status: DC
  Administered 2021-03-21 – 2021-03-22 (×2): 325 mg via ORAL
  Filled 2021-03-21 (×2): qty 1

## 2021-03-21 MED ORDER — ENSURE ENLIVE PO LIQD: 237.0000 mL | Freq: Every day | ORAL | Status: DC | PRN

## 2021-03-21 MED ORDER — SACUBITRIL-VALSARTAN 24-26 MG PO TABS: 1.0000 | ORAL_TABLET | Freq: Two times a day (BID) | ORAL | Status: DC

## 2021-03-21 MED ORDER — LORATADINE 10 MG PO TABS
10.0000 mg | ORAL_TABLET | Freq: Every day | ORAL | Status: DC
  Administered 2021-03-22: 10 mg via ORAL
  Filled 2021-03-21: qty 1

## 2021-03-21 MED ORDER — ENOXAPARIN SODIUM 40 MG/0.4ML IJ SOSY
40.0000 mg | PREFILLED_SYRINGE | INTRAMUSCULAR | Status: DC
  Administered 2021-03-21: 40 mg via SUBCUTANEOUS
  Filled 2021-03-21: qty 0.4

## 2021-03-21 MED ORDER — EMPAGLIFLOZIN 10 MG PO TABS: 10.0000 mg | ORAL_TABLET | Freq: Every day | ORAL | Status: DC

## 2021-03-21 MED ORDER — CARVEDILOL 3.125 MG PO TABS: 3.1250 mg | ORAL_TABLET | Freq: Two times a day (BID) | ORAL | Status: DC

## 2021-03-21 MED ORDER — EZETIMIBE 10 MG PO TABS
10.0000 mg | ORAL_TABLET | Freq: Every day | ORAL | Status: DC
  Administered 2021-03-22: 10 mg via ORAL
  Filled 2021-03-21: qty 1

## 2021-03-21 MED ORDER — PYRIDOSTIGMINE BROMIDE 60 MG PO TABS
120.0000 mg | ORAL_TABLET | Freq: Four times a day (QID) | ORAL | Status: DC
  Administered 2021-03-21 – 2021-03-22 (×3): 120 mg via ORAL
  Filled 2021-03-21 (×4): qty 2

## 2021-03-21 MED ORDER — MIRABEGRON ER 50 MG PO TB24: 50.0000 mg | ORAL_TABLET | Freq: Every day | ORAL | Status: DC

## 2021-03-21 MED ORDER — MYCOPHENOLATE MOFETIL 250 MG PO CAPS
500.0000 mg | ORAL_CAPSULE | Freq: Two times a day (BID) | ORAL | Status: DC
  Administered 2021-03-21 – 2021-03-22 (×2): 500 mg via ORAL
  Filled 2021-03-21 (×3): qty 2

## 2021-03-21 MED ORDER — DESMOPRESSIN ACETATE 0.1 MG PO TABS
0.1000 mg | ORAL_TABLET | Freq: Every day | ORAL | Status: DC
  Administered 2021-03-21: 0.1 mg via ORAL
  Filled 2021-03-21 (×2): qty 1

## 2021-03-21 MED ORDER — POLYETHYL GLYCOL-PROPYL GLYCOL 0.4-0.3 % OP SOLN: 1.0000 [drp] | Freq: Every day | OPHTHALMIC | Status: DC | PRN

## 2021-03-21 MED ORDER — TRAZODONE HCL 50 MG PO TABS: 25.0000 mg | ORAL_TABLET | Freq: Every evening | ORAL | Status: DC | PRN

## 2021-03-21 MED ORDER — SPIRONOLACTONE 25 MG PO TABS: 25.0000 mg | ORAL_TABLET | Freq: Every day | ORAL | Status: DC

## 2021-03-21 MED ORDER — SODIUM CHLORIDE 0.9 % IV BOLUS
250.0000 mL | Freq: Once | INTRAVENOUS | Status: AC
  Administered 2021-03-21: 250 mL via INTRAVENOUS

## 2021-03-21 MED ORDER — EZETIMIBE-SIMVASTATIN 10-40 MG PO TABS: 1.0000 | ORAL_TABLET | Freq: Every day | ORAL | Status: DC

## 2021-03-21 MED ORDER — SIMVASTATIN 20 MG PO TABS
40.0000 mg | ORAL_TABLET | Freq: Every day | ORAL | Status: DC
  Administered 2021-03-22: 40 mg via ORAL
  Filled 2021-03-21: qty 2

## 2021-03-21 MED ORDER — ASPIRIN EC 81 MG PO TBEC
81.0000 mg | DELAYED_RELEASE_TABLET | Freq: Every day | ORAL | Status: DC
  Administered 2021-03-22: 81 mg via ORAL
  Filled 2021-03-21: qty 1

## 2021-03-21 MED ORDER — SACCHAROMYCES BOULARDII 250 MG PO CAPS
250.0000 mg | ORAL_CAPSULE | Freq: Two times a day (BID) | ORAL | Status: DC
  Administered 2021-03-21 – 2021-03-22 (×2): 250 mg via ORAL
  Filled 2021-03-21 (×2): qty 1

## 2021-03-21 MED ORDER — DIGOXIN 125 MCG PO TABS
0.1250 mg | ORAL_TABLET | Freq: Every day | ORAL | Status: DC
  Administered 2021-03-22: 0.125 mg via ORAL
  Filled 2021-03-21: qty 1

## 2021-03-21 MED ORDER — ENSURE PO LIQD: 237.0000 mL | Freq: Every day | ORAL | Status: DC | PRN

## 2021-03-21 MED ORDER — SPIRONOLACTONE 25 MG PO TABS
25.0000 mg | ORAL_TABLET | Freq: Every day | ORAL | Status: DC
  Administered 2021-03-22: 25 mg via ORAL
  Filled 2021-03-21: qty 1

## 2021-03-21 MED ORDER — TAMSULOSIN HCL 0.4 MG PO CAPS
0.4000 mg | ORAL_CAPSULE | Freq: Every day | ORAL | Status: DC
  Administered 2021-03-22: 0.4 mg via ORAL
  Filled 2021-03-21: qty 1

## 2021-03-21 MED ORDER — PYRIDOSTIGMINE BROMIDE 60 MG PO TABS: 120.0000 mg | ORAL_TABLET | Freq: Four times a day (QID) | ORAL | Status: DC

## 2021-03-21 MED ORDER — PANTOPRAZOLE SODIUM 40 MG PO TBEC
40.0000 mg | DELAYED_RELEASE_TABLET | Freq: Two times a day (BID) | ORAL | Status: DC
  Administered 2021-03-21 – 2021-03-22 (×2): 40 mg via ORAL
  Filled 2021-03-21 (×2): qty 1

## 2021-03-21 MED ORDER — PANTOPRAZOLE SODIUM 40 MG PO TBEC: 40.0000 mg | DELAYED_RELEASE_TABLET | Freq: Every day | ORAL | Status: DC

## 2021-03-21 MED ORDER — SACCHAROMYCES BOULARDII 250 MG PO CAPS: 250.0000 mg | ORAL_CAPSULE | Freq: Two times a day (BID) | ORAL | Status: DC

## 2021-03-21 MED ORDER — POLYVINYL ALCOHOL 1.4 % OP SOLN: 1.0000 [drp] | OPHTHALMIC | Status: DC | PRN

## 2021-03-21 MED ORDER — MIRABEGRON ER 50 MG PO TB24
50.0000 mg | ORAL_TABLET | Freq: Every day | ORAL | Status: DC
  Administered 2021-03-22: 50 mg via ORAL
  Filled 2021-03-21: qty 1

## 2021-03-21 NOTE — ED Triage Notes (Signed)
Pt BIB [facility] from doctors office c/o of hypotension. When pt was sitting up in the doctors office BP was 68/40, when pt was lying back BP was 120/64. Pt has no complaints and states [place] just went for a follow [place]

## 2021-03-21 NOTE — ED Notes (Signed)
Floor nurse states he is unable to take pt at this time. Will reattempts in 15 min.

## 2021-03-21 NOTE — Progress Notes (Signed)
Tried to reach patient's neurologist Dr. Felecia Shelling at [facility] neurology [phone]) to discuss about drug-drug interaction of patient's CHF meds and pyridostigmine, left a message.

## 2021-03-21 NOTE — ED Notes (Signed)
Attempted to call report, nurse unable to receive at this time.  

## 2021-03-21 NOTE — ED Notes (Signed)
While standing pt was shaky. Pt urinated with urinal as he was standing. After roughly 2 minutes of standing, pt lost balance and stumbled forward. Pt stated "I felt fine", when asked how he felt while standing.

## 2021-03-21 NOTE — ED Notes (Addendum)
Per verbal order from  [place], pts wife is okay to give home Pyridostigmine at this time for MG.

## 2021-03-21 NOTE — ED Provider Notes (Signed)
[facility] EMERGENCY DEPARTMENT Provider Note   CSN: [id number] Arrival date & time: 03/21/21  1121     History Chief Complaint  Patient presents with   Hypotension    David L Vaughan Mowat. is a 85 y.o. male.  HPI 85 year old male presents from PCPs office via EMS with hypotension.  The patient was at the office for a follow-up from his recent hospitalization for CHF.  He states that he is not sure what meds have been recently adjusted as his wife handles all that but he did take many medicines this morning as typical for the morning.  When he was sitting up at the doctor's office his BP was 60/40 and when they laid him down it was 120/64.  He states he did feel a little bit dizzy/lightheaded when he was sitting up and now had the low blood pressure but he otherwise has been feeling fine including no headache, chest pain, shortness of breath, or recent illness.  Past Medical History:  Diagnosis Date   CAD (coronary artery disease)    previous stent to LAD 2003   Diabetes mellitus without complication ([place])    No longer taking diabetic medications at this time   History of complete heart block    History of kidney stones    Hyperlipidemia    Hypertension    LBBB (left bundle branch block)    Melanoma (HCC)    Myasthenia gravis (HCC)    eyelids, finger, and toes   NSVT (nonsustained ventricular tachycardia) (HCC)    OSA (obstructive sleep apnea)    Does not wear CPAP.   Presence of permanent cardiac pacemaker    S/P cardiac pacemaker procedure, placement 10/24/14 Medtronic Adapta L model ADDRL 1  10/25/2014   Spinal stenosis    Severe    Patient Active Problem List   Diagnosis Date Noted   Hypotension 03/21/2021   Iron deficiency anemia due to chronic blood loss 03/18/2021   Acute blood loss anemia 03/07/2021   Acute HFrEF (heart failure with reduced ejection fraction) ([place]) 03/06/2021   Aortic insufficiency 03/06/2021   Tricuspid regurgitation  03/06/2021   Actinic keratosis 08/24/2020   Cardiac pacemaker in situ 08/24/2020   Conduction disorder of the heart 08/24/2020   Dependence on other enabling machines and devices 08/24/2020   Diarrhea 08/24/2020   Easy bruising 08/24/2020   Family history of malignant neoplasm of gastrointestinal tract 08/24/2020   H/O urethritis 08/24/2020   Has a tremor 08/24/2020   Hearing loss 08/24/2020   Hemorrhoids 08/24/2020   History of gastrointestinal disease 08/24/2020   History of subarachnoid hemorrhage 08/24/2020   Long term (current) use of aspirin 08/24/2020   Immunodeficiency disorder ([place]) 08/24/2020   Nocturnal enuresis 08/24/2020   Personal history of (healed) traumatic fracture 08/24/2020   Type 2 diabetes mellitus with unspecified complications ([place]) [id number]   Vitamin D deficiency 08/24/2020   High risk medication use 11/17/2019   Gait abnormality 11/17/2019   Urinary frequency 11/17/2019   BPH with obstruction/lower urinary tract symptoms 07/06/2019   Dermatochalasis of both upper eyelids 06/15/2018   Peroneal neuropathy, left 05/12/2018   Left foot drop 05/12/2018   Dysphagia 03/26/2018   Lumbosacral radiculopathy 02/23/2018   Facial pain 11/06/2017   Degenerative lumbar spinal stenosis 09/03/2017   Vallecular mass 12/31/2016   Myasthenia gravis ([place]) 05/01/2016   Ptosis of both eyelids 05/01/2016   CAD in native artery 11/03/2015   OSA (obstructive sleep apnea) 11/03/2015   S/P  cardiac pacemaker procedure, placement 10/24/14 Medtronic Adapta L model ADDRL 1  10/25/2014   Complete heart block (HCC) 10/24/2014   CHB (complete heart block), symptomatic 10/24/2014   LAD stent 2003 10/26/2013   HTN (hypertension) 10/26/2013   Hyperlipidemia with target LDL less than 70 10/26/2013   Mild obesity 10/26/2013   Spermatic cord mass-left 03/25/2012    Past Surgical History:  Procedure Laterality Date   COLONOSCOPY     CORONARY ANGIOPLASTY WITH STENT PLACEMENT   03/01/2002   stenting to LAD - Dr. Glade Lloyd   CYSTOSCOPY/URETEROSCOPY/HOLMIUM LASER/STENT PLACEMENT Left 08/31/2019   Procedure: CYSTOSCOPY/RETROGRADE/STENT PLACEMENT;  Surgeon: Ceasar Mons, MD;  Location: [facility];  Service: Urology;  Laterality: Left;  ONY NEEDS 45 MIN   NM MYOCAR PERF WALL MOTION  11/27/2010   normal   PERMANENT PACEMAKER INSERTION N/A 10/24/2014   Procedure: PERMANENT PACEMAKER INSERTION;  Surgeon: [name], MD;  Location: [facility] CATH LAB;  Service: Cardiovascular;  Laterality: N/A;   TONSILLECTOMY     TRANSURETHRAL RESECTION OF PROSTATE N/A 07/06/2019   Procedure: TRANSURETHRAL RESECTION OF THE PROSTATE (TURP), BIPOLAR;  Surgeon: Ceasar Mons, MD;  Location: [facility];  Service: Urology;  Laterality: N/A;   US ECHOCARDIOGRAPHY  12/05/2010   proximal septal thickening,mild concentric LVH,mild deptal hypokinesis,RV mildly dilated,LA mildly dilated,mild AI,moderate aortic root dilatation,septal motion c/w conduction abnormality       Family History  Problem Relation Age of Onset   Breast cancer Mother    Colon cancer Father    Lung cancer Father     Social History   Tobacco Use   Smoking status: Former    Types: Cigarettes, Cigars    Start date: 04/28/1949    Quit date: 10/27/1982    Years since quitting: 38.4   Smokeless tobacco: Never  Vaping Use   Vaping Use: Never used  Substance Use Topics   Alcohol use: Not Currently    Comment: 1-2 drink per week   Drug use: No    Home Medications Prior to Admission medications   Medication Sig Start Date End Date Taking? Authorizing Provider  aspirin EC 81 MG tablet Take 81 mg by mouth daily.     [provider]  carvedilol (COREG) 3.125 MG tablet Take 1 tablet (3.125 mg total) by mouth 2 (two) times daily with a meal. 03/11/21   Kathyrn Drown D, NP  desmopressin (DDAVP) 0.1 MG tablet TAKE 1 TABLET(0.1 MG) BY MOUTH AT BEDTIME Patient taking  differently: Take 0.1 mg by mouth at bedtime. 12/17/20   Sater, Nanine Means, MD  digoxin (LANOXIN) 0.125 MG tablet Take 1 tablet (0.125 mg total) by mouth daily. 03/11/21   Kathyrn Drown D, NP  empagliflozin (JARDIANCE) 10 MG TABS tablet Take 1 tablet (10 mg total) by mouth daily. 03/11/21   Tommie Raymond, NP  Ensure (ENSURE) Take 237 mLs by mouth daily as needed (feeding suppliment).    [provider]  ezetimibe-simvastatin (VYTORIN) 10-40 MG tablet Take 1 tablet by mouth daily.    [provider]  ferrous sulfate 325 (65 FE) MG tablet Take 1 tablet (325 mg total) by mouth 2 (two) times daily with a meal. 03/11/21   Tommie Raymond, NP  furosemide (LASIX) 20 MG tablet Take 1 tablet (20 mg total) by mouth as needed for edema (for weight gain of 2-3lb and ankle edema). 03/11/21 03/11/22  Kathyrn Drown D, NP  loratadine (CLARITIN) 10 MG tablet Take 10 mg by mouth  daily.    [provider]  mycophenolate (CELLCEPT) 250 MG capsule TAKE 2 CAPSULES BY MOUTH TWICE DAILY Patient taking differently: Take 500 mg by mouth 2 (two) times daily. 11/26/20   Sater, Nanine Means, MD  MYRBETRIQ 50 MG TB24 tablet Take 50 mg by mouth daily. 08/21/20   [provider]  nitroGLYCERIN (NITROSTAT) 0.4 MG SL tablet Place 1 tablet (0.4 mg total) under the tongue every 5 (five) minutes as needed for chest pain. Reported on 11/01/2015 11/07/15   [name], MD  pantoprazole (PROTONIX) 40 MG tablet '40mg'$  twice daily until 04/11/21, then reduce to '40mg'$  daily thereafter 03/11/21   Tommie Raymond, NP  Polyethyl Glycol-Propyl Glycol (SYSTANE) 0.4-0.3 % SOLN Place 1 drop into both eyes daily as needed (dry eyes).    [provider]  pyridostigmine (MESTINON) 60 MG tablet Take 2 tablets by mouth four times daily 03/14/21   Sater, Nanine Means, MD  saccharomyces boulardii (FLORASTOR) 250 MG capsule Take 250 mg by mouth 2 (two) times daily.    [provider]  sacubitril-valsartan (ENTRESTO)  24-26 MG Take 1 tablet by mouth 2 (two) times daily. 03/11/21   Kathyrn Drown D, NP  spironolactone (ALDACTONE) 25 MG tablet Take 1 tablet (25 mg total) by mouth daily. 03/11/21   Kathyrn Drown D, NP  tamsulosin (FLOMAX) 0.4 MG CAPS capsule Take 1 capsule (0.4 mg total) by mouth daily. 03/12/21   Tommie Raymond, NP  traZODone (DESYREL) 50 MG tablet Take 0.5-1 tablets (25-50 mg total) by mouth at bedtime as needed for sleep. 02/14/21   Sater, Nanine Means, MD  Wheat Dextrin (BENEFIBER) POWD Take 2 Scoops by mouth daily.    [provider]    Allergies    Azithromycin, Clindamycin hcl, Doxycycline, Doxycycline hyclate, Levofloxacin, Niacin, Gris-peg [griseofulvin], and Terbinafine  Review of Systems   Review of Systems  Constitutional:  Negative for fever.  Respiratory:  Negative for shortness of breath.   Cardiovascular:  Negative for chest pain.  Gastrointestinal:  Negative for abdominal pain, diarrhea and vomiting.  Genitourinary:  Negative for dysuria.  Neurological:  Positive for light-headedness. Negative for weakness, numbness and headaches.  All other systems reviewed and are negative.  Physical Exam Updated Vital Signs BP 111/66   Pulse 61   Temp 98.2 F (36.8 C) (Oral)   Resp 14   Ht '5\' 9"'$  (1.753 m)   Wt 64.5 kg   SpO2 100%   BMI 21.00 kg/m   Physical Exam Vitals and nursing note reviewed.  Constitutional:      General: He is not in acute distress.    Appearance: He is well-developed. He is not ill-appearing or diaphoretic.  HENT:     Head: Normocephalic and atraumatic.     Right Ear: External ear normal.     Left Ear: External ear normal.     Nose: Nose normal.  Eyes:     General:        Right eye: No discharge.        Left eye: No discharge.     Extraocular Movements: Extraocular movements intact.     Pupils: Pupils are equal, round, and reactive to light.  Cardiovascular:     Rate and Rhythm: Normal rate and regular rhythm.     Heart sounds: Normal  heart sounds.  Pulmonary:     Effort: Pulmonary effort is normal.     Breath sounds: Normal breath sounds.  Abdominal:     Palpations: Abdomen  is soft.     Tenderness: There is no abdominal tenderness.  Musculoskeletal:     Cervical back: Neck supple.  Skin:    General: Skin is warm and dry.  Neurological:     Mental Status: He is alert.     Comments: CN 3-12 grossly intact. 5/5 strength in all 4 extremities. Grossly normal sensation. Normal finger to nose.   Psychiatric:        Mood and Affect: Mood is not anxious.    ED Results / Procedures / Treatments   Labs (all labs ordered are listed, but only abnormal results are displayed) Labs Reviewed  CBC WITH DIFFERENTIAL/PLATELET - Abnormal; Notable for the following components:      Result Value   RBC 3.81 (*)    Hemoglobin 10.7 (*)    HCT 35.0 (*)    RDW 17.7 (*)    All other components within normal limits  URINALYSIS, ROUTINE W REFLEX MICROSCOPIC - Abnormal; Notable for the following components:   Glucose, UA >=500 (*)    All other components within normal limits  COMPREHENSIVE METABOLIC PANEL - Abnormal; Notable for the following components:   Sodium 133 (*)    Creatinine, Ser 0.57 (*)    Calcium 8.5 (*)    Total Protein 5.6 (*)    Albumin 3.2 (*)    Anion gap 4 (*)    All other components within normal limits  SARS CORONAVIRUS 2 (TAT 6-24 HRS)  LACTIC ACID, PLASMA    EKG EKG Interpretation  Date/Time:  Thursday March 21 2021 11:27:23 EDT Ventricular Rate:  63 PR Interval:    QRS Duration: 169 QT Interval:  428 QTC Calculation: 439 R Axis:   -69 Text Interpretation: Paced rhythm LVH with IVCD, LAD and secondary repol abnrm Confirmed by Sherwood Gambler [phone]) on 03/21/2021 11:38:47 AM  Radiology DG Chest Portable 1 View  Result Date: 03/21/2021 CLINICAL DATA:  Hypotension. EXAM: PORTABLE CHEST 1 VIEW COMPARISON:  March 07, 2021. FINDINGS: Stable cardiomegaly. Left-sided pacemaker is unchanged in position.  Lungs are clear. Bony thorax is unremarkable. IMPRESSION: No active disease. Electronically Signed   By: Marijo Conception M.D.   On: 03/21/2021 12:06    Procedures Procedures   Medications Ordered in ED Medications  aspirin EC tablet 81 mg (has no administration in time range)  carvedilol (COREG) tablet 3.125 mg (has no administration in time range)  digoxin (LANOXIN) tablet 0.125 mg (has no administration in time range)  ezetimibe-simvastatin (VYTORIN) 10-40 MG per tablet 1 tablet (has no administration in time range)  sacubitril-valsartan (ENTRESTO) 24-26 mg per tablet (has no administration in time range)  spironolactone (ALDACTONE) tablet 25 mg (has no administration in time range)  traZODone (DESYREL) tablet 25-50 mg (has no administration in time range)  desmopressin (DDAVP) tablet 0.1 mg (has no administration in time range)  empagliflozin (JARDIANCE) tablet 10 mg (has no administration in time range)  pantoprazole (PROTONIX) EC tablet 40 mg (has no administration in time range)  saccharomyces boulardii (FLORASTOR) capsule 250 mg (has no administration in time range)  mirabegron ER (MYRBETRIQ) tablet 50 mg (has no administration in time range)  tamsulosin (FLOMAX) capsule 0.4 mg (has no administration in time range)  ferrous sulfate tablet 325 mg (has no administration in time range)  mycophenolate (CELLCEPT) capsule 500 mg (has no administration in time range)  pyridostigmine (MESTINON) tablet 120 mg (has no administration in time range)  Ensure liquid 237 mL (has no administration in time range)  loratadine (  CLARITIN) tablet 10 mg (has no administration in time range)  Polyethyl Glycol-Propyl Glycol 0.4-0.3 % SOLN 1 drop (has no administration in time range)  enoxaparin (LOVENOX) injection 40 mg (has no administration in time range)  sodium chloride 0.9 % bolus 250 mL (0 mLs Intravenous Stopped 03/21/21 1213)  sodium chloride 0.9 % bolus 250 mL (0 mLs Intravenous Stopped 03/21/21  1356)    ED Course  I have reviewed the triage vital signs and the nursing notes.  Pertinent labs & imaging results that were available during my care of the patient were reviewed by me and considered in my medical decision making (see chart for details).    MDM Rules/Calculators/A&P                           Patient appears well but has significant hypotension when sitting up.  Despite a couple small boluses this remains and he is symptomatic.  Given that I do not think he is stable to go home.  I did discuss with cardiology at first (Dr. Radford Pax) but she recommends that given no specific heart failure symptoms he could be admitted to the hospitalist service.  Dr. Roosevelt Locks to admit.  Labs are overall unremarkable.  This seems unlikely to be infectious and rather is likely medication related. Final Clinical Impression(s) / ED Diagnoses Final diagnoses:  Orthostatic hypotension    Rx / DC Orders ED Discharge Orders     None        Sherwood Gambler, MD 03/21/21 1542

## 2021-03-21 NOTE — H&P (Signed)
History and Physical    David [name] Mam. [id number] DOB: 1935/12/12 DOA: 03/21/2021  PCP: [name], MD (Confirm with patient/family/NH records and if not entered, this has to be entered at [facility] point of entry) Patient coming from: Home  I have personally briefly reviewed patient's old medical records in [facility]  Chief Complaint: Sunday Corn headed  HPI: Brant Milardo. is a 85 y.o. male with medical history significant of chronic combined systolic and diastolic CHF, LVEF 20 to [id number], 02/2021, Myasthenia gravis, third-degree AV block status post PPM, IIDM, BPH, HLD, presented with symptomatic hypotension.  Patient was recently diagnosed with combined systolic and diastolic CHF in August, during that admission, patient received aggressive diuresis and about 5 L of net weight loss.  And patient was discharged with CHF medications including Coreg, Aldactone, Entresto.  Reported that patient has been tolerating CHF medication fairly well until this week, when and had 1 episode of lightheadedness this morning.  Denies any chest pain, no shortness of breath, no vision problems, denied any muscle weakness, no fall, no LOC.  Patient and family confirmed that patient has not received any diuresis in the last 7 days.  ED Course: SBP=70s, given IV Bolus x2, SBP in lower 100s, but still having symptoms of lightheadedness when standing up to urinate.  Review of Systems: As per HPI otherwise 14 point review of systems negative.    Past Medical History:  Diagnosis Date   CAD (coronary artery disease)    previous stent to LAD 2003   Diabetes mellitus without complication ([place])    No longer taking diabetic medications at this time   History of complete heart block    History of kidney stones    Hyperlipidemia    Hypertension    LBBB (left bundle branch block)    Melanoma (HCC)    Myasthenia gravis (HCC)    eyelids, finger, and toes   NSVT (nonsustained ventricular tachycardia)  (HCC)    OSA (obstructive sleep apnea)    Does not wear CPAP.   Presence of permanent cardiac pacemaker    S/P cardiac pacemaker procedure, placement 10/24/14 Medtronic Adapta L model ADDRL 1  10/25/2014   Spinal stenosis    Severe    Past Surgical History:  Procedure Laterality Date   COLONOSCOPY     CORONARY ANGIOPLASTY WITH STENT PLACEMENT  03/01/2002   stenting to LAD - Dr. Glade Lloyd   CYSTOSCOPY/URETEROSCOPY/HOLMIUM LASER/STENT PLACEMENT Left 08/31/2019   Procedure: CYSTOSCOPY/RETROGRADE/STENT PLACEMENT;  Surgeon: Ceasar Mons, MD;  Location: [facility];  Service: Urology;  Laterality: Left;  ONY NEEDS 45 MIN   NM MYOCAR PERF WALL MOTION  11/27/2010   normal   PERMANENT PACEMAKER INSERTION N/A 10/24/2014   Procedure: PERMANENT PACEMAKER INSERTION;  Surgeon: [name], MD;  Location: [facility] CATH LAB;  Service: Cardiovascular;  Laterality: N/A;   TONSILLECTOMY     TRANSURETHRAL RESECTION OF PROSTATE N/A 07/06/2019   Procedure: TRANSURETHRAL RESECTION OF THE PROSTATE (TURP), BIPOLAR;  Surgeon: Ceasar Mons, MD;  Location: [facility];  Service: Urology;  Laterality: N/A;   US ECHOCARDIOGRAPHY  12/05/2010   proximal septal thickening,mild concentric LVH,mild deptal hypokinesis,RV mildly dilated,LA mildly dilated,mild AI,moderate aortic root dilatation,septal motion c/w conduction abnormality     reports that he quit smoking about 38 years ago. His smoking use included cigarettes and cigars. He started smoking about 71 years ago. He has never used smokeless tobacco. He reports that he does not currently use  alcohol. He reports that he does not use drugs.  Allergies  Allergen Reactions   Azithromycin Other (See Comments)   Clindamycin Hcl Other (See Comments)   Doxycycline Diarrhea   Doxycycline Hyclate Other (See Comments)   Levofloxacin Other (See Comments)   Niacin Other (See Comments)   Gris-Peg [Griseofulvin] Other (See Comments)     headaches   Terbinafine Other (See Comments)    headache    Family History  Problem Relation Age of Onset   Breast cancer Mother    Colon cancer Father    Lung cancer Father      Prior to Admission medications   Medication Sig Start Date End Date Taking? Authorizing Provider  aspirin EC 81 MG tablet Take 81 mg by mouth daily.     [provider]  carvedilol (COREG) 3.125 MG tablet Take 1 tablet (3.125 mg total) by mouth 2 (two) times daily with a meal. 03/11/21   Kathyrn Drown D, NP  desmopressin (DDAVP) 0.1 MG tablet TAKE 1 TABLET(0.1 MG) BY MOUTH AT BEDTIME Patient taking differently: Take 0.1 mg by mouth at bedtime. 12/17/20   Sater, Nanine Means, MD  digoxin (LANOXIN) 0.125 MG tablet Take 1 tablet (0.125 mg total) by mouth daily. 03/11/21   Kathyrn Drown D, NP  empagliflozin (JARDIANCE) 10 MG TABS tablet Take 1 tablet (10 mg total) by mouth daily. 03/11/21   Tommie Raymond, NP  Ensure (ENSURE) Take 237 mLs by mouth daily as needed (feeding suppliment).    [provider]  ezetimibe-simvastatin (VYTORIN) 10-40 MG tablet Take 1 tablet by mouth daily.    [provider]  ferrous sulfate 325 (65 FE) MG tablet Take 1 tablet (325 mg total) by mouth 2 (two) times daily with a meal. 03/11/21   Tommie Raymond, NP  furosemide (LASIX) 20 MG tablet Take 1 tablet (20 mg total) by mouth as needed for edema (for weight gain of 2-3lb and ankle edema). 03/11/21 03/11/22  Kathyrn Drown D, NP  loratadine (CLARITIN) 10 MG tablet Take 10 mg by mouth daily.    [provider]  mycophenolate (CELLCEPT) 250 MG capsule TAKE 2 CAPSULES BY MOUTH TWICE DAILY Patient taking differently: Take 500 mg by mouth 2 (two) times daily. 11/26/20   Sater, Nanine Means, MD  MYRBETRIQ 50 MG TB24 tablet Take 50 mg by mouth daily. 08/21/20   [provider]  nitroGLYCERIN (NITROSTAT) 0.4 MG SL tablet Place 1 tablet (0.4 mg total) under the tongue every 5 (five) minutes as needed for chest  pain. Reported on 11/01/2015 11/07/15   [name], MD  pantoprazole (PROTONIX) 40 MG tablet '40mg'$  twice daily until 04/11/21, then reduce to '40mg'$  daily thereafter 03/11/21   Tommie Raymond, NP  Polyethyl Glycol-Propyl Glycol (SYSTANE) 0.4-0.3 % SOLN Place 1 drop into both eyes daily as needed (dry eyes).    [provider]  pyridostigmine (MESTINON) 60 MG tablet Take 2 tablets by mouth four times daily 03/14/21   Sater, Nanine Means, MD  saccharomyces boulardii (FLORASTOR) 250 MG capsule Take 250 mg by mouth 2 (two) times daily.    [provider]  sacubitril-valsartan (ENTRESTO) 24-26 MG Take 1 tablet by mouth 2 (two) times daily. 03/11/21   Kathyrn Drown D, NP  spironolactone (ALDACTONE) 25 MG tablet Take 1 tablet (25 mg total) by mouth daily. 03/11/21   Kathyrn Drown D, NP  tamsulosin (FLOMAX) 0.4 MG CAPS capsule Take 1 capsule (0.4 mg total) by mouth daily. 03/12/21  Kathyrn Drown D, NP  traZODone (DESYREL) 50 MG tablet Take 0.5-1 tablets (25-50 mg total) by mouth at bedtime as needed for sleep. 02/14/21   Sater, Nanine Means, MD  Wheat Dextrin (BENEFIBER) POWD Take 2 Sco[place] by mouth daily.    [provider]    Physical Exam: Vitals:   03/21/21 1345 03/21/21 1400 03/21/21 1415 03/21/21 1500  BP: 124/69 126/69 (!) 141/66 111/66  Pulse: (!) 57 60 61 61  Resp: (!) 27 (!) 21 (!) 27 14  Temp:      TempSrc:      SpO2: 96% 96% 99% 100%  Weight:      Height:        Constitutional: NAD, calm, comfortable Vitals:   03/21/21 1345 03/21/21 1400 03/21/21 1415 03/21/21 1500  BP: 124/69 126/69 (!) 141/66 111/66  Pulse: (!) 57 60 61 61  Resp: (!) 27 (!) 21 (!) 27 14  Temp:      TempSrc:      SpO2: 96% 96% 99% 100%  Weight:      Height:       Eyes: PERRL, lids and conjunctivae normal ENMT: Mucous membranes are dry. Posterior pharynx clear of any exudate or lesions.Normal dentition.  Neck: normal, supple, no masses, no thyromegaly Respiratory: clear to auscultation  bilaterally, no wheezing, no crackles. Normal respiratory effort. No accessory muscle use.  Cardiovascular: Regular rate and rhythm, no murmurs / rubs / gallops. No extremity edema. 2+ pedal pulses. No carotid bruits.  Abdomen: no tenderness, no masses palpated. No hepatosplenomegaly. Bowel sounds positive.  Musculoskeletal: no clubbing / cyanosis. No joint deformity upper and lower extremities. Good ROM, no contractures. Normal muscle tone.  Skin: no rashes, lesions, ulcers. No induration Neurologic: CN 2-12 grossly intact. Sensation intact, DTR normal. Strength 5/5 in all 4.  Psychiatric: Normal judgment and insight. Alert and oriented x 3. Normal mood.     Labs on Admission: I have personally reviewed following labs and imaging studies  CBC: Recent Labs  Lab 03/18/21 1112 03/21/21 1133  WBC 5.0 4.9  NEUTROABS  --  3.1  HGB 11.4* 10.7*  HCT 37.7* 35.0*  MCV 91.3 91.9  PLT 294 [id number]   Basic Metabolic Panel: Recent Labs  Lab 03/18/21 1112 03/21/21 1303  NA 132* 133*  K 3.9 4.0  CL 96* 99  CO2 30 30  GLUCOSE 104* 88  BUN 10 9  CREATININE 0.60* 0.57*  CALCIUM 8.5* 8.5*   GFR: Estimated Creatinine Clearance: 63.8 mL/min (A) (by C-G formula based on SCr of 0.57 mg/dL (L)). Liver Function Tests: Recent Labs  Lab 03/21/21 1303  AST 25  ALT 23  ALKPHOS 59  BILITOT 1.0  PROT 5.6*  ALBUMIN 3.2*   No results for input(s): LIPASE, AMYLASE in the last 168 hours. No results for input(s): AMMONIA in the last 168 hours. Coagulation Profile: No results for input(s): INR, PROTIME in the last 168 hours. Cardiac Enzymes: No results for input(s): CKTOTAL, CKMB, CKMBINDEX, TROPONINI in the last 168 hours. BNP (last 3 results) No results for input(s): PROBNP in the last 8760 hours. HbA1C: No results for input(s): HGBA1C in the last 72 hours. CBG: No results for input(s): GLUCAP in the last 168 hours. Lipid Profile: No results for input(s): CHOL, HDL, LDLCALC, TRIG, CHOLHDL,  LDLDIRECT in the last 72 hours. Thyroid Function Tests: No results for input(s): TSH, T4TOTAL, FREET4, T3FREE, THYROIDAB in the last 72 hours. Anemia Panel: No results for input(s): VITAMINB12, FOLATE, FERRITIN, TIBC, IRON, RETICCTPCT  in the last 72 hours. Urine analysis:    Component Value Date/Time   COLORURINE YELLOW 03/21/2021 [place] 03/21/2021 1134   LABSPEC 1.006 03/21/2021 1134   PHURINE 7.0 03/21/2021 1134   GLUCOSEU >=500 (A) 03/21/2021 1134   HGBUR NEGATIVE 03/21/2021 1134   [place] 03/21/2021 1134   KETONESUR NEGATIVE 03/21/2021 1134   PROTEINUR NEGATIVE 03/21/2021 1134   NITRITE NEGATIVE 03/21/2021 [place] 03/21/2021 1134    Radiological Exams on Admission: DG Chest Portable 1 View  Result Date: 03/21/2021 CLINICAL DATA:  Hypotension. EXAM: PORTABLE CHEST 1 VIEW COMPARISON:  March 07, 2021. FINDINGS: Stable cardiomegaly. Left-sided pacemaker is unchanged in position. Lungs are clear. Bony thorax is unremarkable. IMPRESSION: No active disease. Electronically Signed   By: Marijo Conception M.D.   On: 03/21/2021 12:06    EKG: Independently reviewed. Paced.  Assessment/Plan Active Problems:   Hypotension  (please populate well all problems here in Problem List. (For example, if patient is on BP meds at home and you resume or decide to hold them, it is a problem that needs to be her. Same for CAD, COPD, HLD and so on)  Near syncope/symptomatic hypotension -Likely iatrogenic from CHF medications, also suspect drug drug interaction of CHF/BP medication with his MG medication pyridostigmine.  Consult cardiology to help manage/simplify CHF medications. -Orthostatic vital signs twice daily, PT evaluation.  Myasthenia Gravis -No vision problems, denied any muscle weakness or generalized weakness no breathing problems. -Check vital capacity twice daily -Continue pyridostigmine and CellCept  Moderate to severe aortic  regurgitation -Outpatient follow-up with cardiology.  Severe protein calorie malnutrition -On protein supplement.  IIDM -Continue Jardiance  BPH -Off Foley -Continue Flomax  DVT prophylaxis: Lovenox Code Status: Full Code Family Communication: Wife at bedside Disposition Plan: Expect less than 2 midnight hospital stay Consults called: Cardiology Admission status: Tele obs   Lequita Halt MD [facility] Pager [phone]  03/21/2021, 3:39 PM

## 2021-03-21 NOTE — Consult Note (Addendum)
Cardiology Consultation:   Patient ID: David [name]. MRN: [id number]; DOB: 03-14-1936  Admit date: 03/21/2021 Date of Consult: 03/21/2021  PCP:  [name], MD   [facility] Providers Cardiologist:  Kirk Ruths, MD  Electrophysiologist:  [name], MD       Patient Profile:   David [name]. is a 85 y.o. male with a hx of CAD s/p PCI to LAD 2003, HTN, HLD, recently diagnosed chronic HFrEF, prior CHB s/p MDT PPM 2016, myasthenia gravis, moderate-severe aortic insufficiency, moderate tricuspid regurgitation, dilated ascending aorta, anemia, chronic balance problems, LBBB who is being seen 03/21/2021 for the evaluation of hypotension at the request of Dr. Roosevelt Locks.  History of Present Illness:   [name] has prior history of CAD s/p PCI to LAD in 2003 as well as PPM in 2016. At baseline his mobility is limited due to balance issues. His LV function was last known to be normal in 09/2014 by echocardiogra. In 03/2015, he had a nuclear tress test showing inferior thinning but no ischemia, EF 43%. He was seen in the office 01/2021 by Dr. Stanford Breed with increased LE edema. The patient declined diuretic at the time due to urinary frequency. 2D echo was obtained 02/26/21 showing LVEF 20-25%, global HK, also severely reduced RV function, mildly enlarged RV, grade 1 DD, moderate LAE, severe RAE, moderate-severe AI, moderate TR, moderately dilated ascending aorta. He was started on Entresto. He was seen back 03/06/21 at which time he was felt to be volume overloaded so was admitted for further management. He was diuresed with IV Lasix and started on spironolactone along with digoxin and Jardiance. Admission complicated by urinary retention requiring foley (prior issue for patient). He also had hypotension felt due to diuresis so Lasix was discontinued. Cardiac cath was considered but deferred due to age and medical problems. Prior to admission he also reported dark stools and was found to have  iron deficiency anemia/heme positive stools. He was seen by Dr. Alessandra Bevels who recommended conservative management which the patient preferred as well - was treated with IV iron and BID PPI with OP GI follow-up planned in 2 months. Regarding his aortic insufficiency, it was to be followed in the future although not clear he would ever be a candidate for AVR. He was seen in the [facility] feeling well - BP was 102/58 so unable to titrate meds further. Last device interrogation - 5 AHR episodes. Longest 4:16 min, Burden <0.1%; 5 HVR episodes, Longest 7 sec Ave V rate 150's.   He presented to Dr. Pennie Banter office today for routine followup. He actually felt better today than he's felt in a while. Last night he had a fleeting discomfort his left chest somewhat into his arm underneath his pacemaker which resolved spontaneously without intervention - was very brief, only just a few minutes. He was not particularly concerned and [place]. He was found to have positional hypotension with sitting BP 68/40, lying 120/64. He had minor dizziness upon sitting or standing, but had otherwise been feeling fine recently without chest pain, dizziness, syncope or other complaints. He gets DOE if he tries to move faster but this is not changed much recently. No orthopnea, edema, or weight gain. He was sent to the ED for further evaluation. SBP was normal on arrival but was noted to be orthostatic down to the 70s/40s with standing. He was given IV fluids in the ED. Labs show mild hyponatremia of 133 (previous DC  value 132), Hgb 10.7 (pre-discharge values 9.4-11.4), CXR NAD. EKG shows NSR with intermittent AV pacing.   Past Medical History:  Diagnosis Date   CAD (coronary artery disease)    previous stent to LAD 2003   Diabetes mellitus without complication ([place])    No longer taking diabetic medications at this time   History of complete heart block    History of kidney stones     Hyperlipidemia    Hypertension    LBBB (left bundle branch block)    Melanoma (HCC)    Myasthenia gravis (HCC)    eyelids, finger, and toes   NSVT (nonsustained ventricular tachycardia) (HCC)    OSA (obstructive sleep apnea)    Does not wear CPAP.   Presence of permanent cardiac pacemaker    S/P cardiac pacemaker procedure, placement 10/24/14 Medtronic Adapta L model ADDRL 1  10/25/2014   Spinal stenosis    Severe    Past Surgical History:  Procedure Laterality Date   COLONOSCOPY     CORONARY ANGIOPLASTY WITH STENT PLACEMENT  03/01/2002   stenting to LAD - Dr. Glade Lloyd   CYSTOSCOPY/URETEROSCOPY/HOLMIUM LASER/STENT PLACEMENT Left 08/31/2019   Procedure: CYSTOSCOPY/RETROGRADE/STENT PLACEMENT;  Surgeon: Ceasar Mons, MD;  Location: [facility];  Service: Urology;  Laterality: Left;  ONY NEEDS 45 MIN   NM MYOCAR PERF WALL MOTION  11/27/2010   normal   PERMANENT PACEMAKER INSERTION N/A 10/24/2014   Procedure: PERMANENT PACEMAKER INSERTION;  Surgeon: [name], MD;  Location: [facility] C[facility] LAB;  Service: Cardiovascular;  Laterality: N/A;   TONSILLECTOMY     TRANSURETHRAL RESECTION OF PROSTATE N/A 07/06/2019   Procedure: TRANSURETHRAL RESECTION OF THE PROSTATE (TURP), BIPOLAR;  Surgeon: Ceasar Mons, MD;  Location: [facility];  Service: Urology;  Laterality: N/A;   US ECHOCARDIOGRAPHY  12/05/2010   proximal septal thickening,mild concentric LVH,mild deptal hypokinesis,RV mildly dilated,LA mildly dilated,mild AI,moderate aortic root dilatation,septal motion c/w conduction abnormality     Home Medications:  Prior to Admission medications   Medication Sig Start Date End Date Taking? Authorizing Provider  aspirin EC 81 MG tablet Take 81 mg by mouth daily.     [provider]  carvedilol (COREG) 3.125 MG tablet Take 1 tablet (3.125 mg total) by mouth 2 (two) times daily with a meal. 03/11/21   Kathyrn Drown D, NP  desmopressin (DDAVP) 0.1  MG tablet TAKE 1 TABLET(0.1 MG) BY MOUTH AT BEDTIME Patient taking differently: Take 0.1 mg by mouth at bedtime. 12/17/20   Sater, Nanine Means, MD  digoxin (LANOXIN) 0.125 MG tablet Take 1 tablet (0.125 mg total) by mouth daily. 03/11/21   Kathyrn Drown D, NP  empagliflozin (JARDIANCE) 10 MG TABS tablet Take 1 tablet (10 mg total) by mouth daily. 03/11/21   Tommie Raymond, NP  Ensure (ENSURE) Take 237 mLs by mouth daily as needed (feeding suppliment).    [provider]  ezetimibe-simvastatin (VYTORIN) 10-40 MG tablet Take 1 tablet by mouth daily.    [provider]  ferrous sulfate 325 (65 FE) MG tablet Take 1 tablet (325 mg total) by mouth 2 (two) times daily with a meal. 03/11/21   Tommie Raymond, NP  furosemide (LASIX) 20 MG tablet Take 1 tablet (20 mg total) by mouth as needed for edema (for weight gain of 2-3lb and ankle edema). 03/11/21 03/11/22  Kathyrn Drown D, NP  loratadine (CLARITIN) 10 MG tablet Take 10 mg by mouth daily.    [provider]  mycophenolate (  CELLCEPT) 250 MG capsule TAKE 2 CAPSULES BY MOUTH TWICE DAILY Patient taking differently: Take 500 mg by mouth 2 (two) times daily. 11/26/20   Sater, Nanine Means, MD  MYRBETRIQ 50 MG TB24 tablet Take 50 mg by mouth daily. 08/21/20   [provider]  nitroGLYCERIN (NITROSTAT) 0.4 MG SL tablet Place 1 tablet (0.4 mg total) under the tongue every 5 (five) minutes as needed for chest pain. Reported on 11/01/2015 11/07/15   [name], MD  pantoprazole (PROTONIX) 40 MG tablet '40mg'$  twice daily until 04/11/21, then reduce to '40mg'$  daily thereafter Patient taking differently: Take 40 mg by mouth 2 (two) times daily. '40mg'$  twice daily until 04/11/21, then reduce to '40mg'$  daily thereafter 03/11/21   Tommie Raymond, NP  Polyethyl Glycol-Propyl Glycol (SYSTANE) 0.4-0.3 % SOLN Place 1 drop into both eyes daily as needed (dry eyes).    [provider]  pyridostigmine (MESTINON) 60 MG tablet Take 2 tablets by mouth  four times daily Patient taking differently: Take 120 mg by mouth 4 (four) times daily. 03/14/21   Sater, Nanine Means, MD  saccharomyces boulardii (FLORASTOR) 250 MG capsule Take 250 mg by mouth 2 (two) times daily.    [provider]  sacubitril-valsartan (ENTRESTO) 24-26 MG Take 1 tablet by mouth 2 (two) times daily. 03/11/21   Kathyrn Drown D, NP  spironolactone (ALDACTONE) 25 MG tablet Take 1 tablet (25 mg total) by mouth daily. 03/11/21   Kathyrn Drown D, NP  tamsulosin (FLOMAX) 0.4 MG CAPS capsule Take 1 capsule (0.4 mg total) by mouth daily. 03/12/21   Tommie Raymond, NP  traZODone (DESYREL) 50 MG tablet Take 0.5-1 tablets (25-50 mg total) by mouth at bedtime as needed for sleep. 02/14/21   Sater, Nanine Means, MD  Wheat Dextrin (BENEFIBER) POWD Take 2 Scoops by mouth daily.    [provider]    Inpatient Medications: Scheduled Meds:  aspirin EC  81 mg Oral Daily   [START ON 03/22/2021] carvedilol  3.125 mg Oral BID WC   desmopressin  0.1 mg Oral QHS   [START ON 03/22/2021] digoxin  0.125 mg Oral Daily   enoxaparin (LOVENOX) injection  40 mg Subcutaneous Q24H   ezetimibe-simvastatin  1 tablet Oral Daily   ferrous sulfate  325 mg Oral BID WC   loratadine  10 mg Oral Daily   mirabegron ER  50 mg Oral Daily   mycophenolate  500 mg Oral BID   pantoprazole  40 mg Oral Daily   pyridostigmine  120 mg Oral Q6H   saccharomyces boulardii  250 mg Oral BID   [START ON 03/22/2021] sacubitril-valsartan  1 tablet Oral BID   [START ON 03/22/2021] spironolactone  25 mg Oral Daily   tamsulosin  0.4 mg Oral Daily   Continuous Infusions:  PRN Meds: Ensure, Polyethyl Glycol-Propyl Glycol, traZODone  Allergies:    Allergies  Allergen Reactions   Azithromycin Other (See Comments)   Clindamycin Hcl Other (See Comments)   Doxycycline Diarrhea   Doxycycline Hyclate Other (See Comments)   Levofloxacin Other (See Comments)   Niacin Other (See Comments)   Gris-Peg [Griseofulvin] Other  (See Comments)    headaches   Terbinafine Other (See Comments)    headache    Social History:   Social History   Socioeconomic History   Marital status: Married    Spouse name: Not on file   Number of children: Not on file   Years of education: Not on file   Highest education  level: Not on file  Occupational History   Not on file  Tobacco Use   Smoking status: Former    Types: Cigarettes, Cigars    Start date: 04/28/1949    Quit date: 10/27/1982    Years since quitting: 38.4   Smokeless tobacco: Never  Vaping Use   Vaping Use: Never used  Substance and Sexual Activity   Alcohol use: Not Currently    Comment: 1-2 drink per week   Drug use: No   Sexual activity: Not on file  Other Topics Concern   Not on file  Social History Narrative   Not on file   Social Determinants of Health   Financial Resource Strain: Not on file  Food Insecurity: Not on file  Transportation Needs: Not on file  Physical Activity: Not on file  Stress: Not on file  Social Connections: Not on file  Intimate Partner Violence: Not on file    Family History:   Family History  Problem Relation Age of Onset   Breast cancer Mother    Colon cancer Father    Lung cancer Father      ROS:  Please see the history of present illness.  All other ROS reviewed and negative.     Physical Exam/Data:   Vitals:   03/21/21 1345 03/21/21 1400 03/21/21 1415 03/21/21 1500  BP: 124/69 126/69 (!) 141/66 111/66  Pulse: (!) 57 60 61 61  Resp: (!) 27 (!) 21 (!) 27 14  Temp:      TempSrc:      SpO2: 96% 96% 99% 100%  Weight:      Height:        Intake/Output Summary (Last 24 hours) at 03/21/2021 1648 Last data filed at 03/21/2021 1356 Gross per 24 hour  Intake 500 ml  Output --  Net 500 ml   Last 3 Weights 03/21/2021 03/18/2021 03/11/2021  Weight (lbs) 142 lb 3.2 oz 142 lb 3.2 oz 144 lb 1.6 oz  Weight (kg) 64.5 kg 64.501 kg 65.363 kg     Body mass index is 21 kg/m.  General: Well developed, well  nourished WM in no acute distress. Head: Normocephalic, atraumatic, sclera non-icteric, no xanthomas, nares are without discharge. Neck: Negative for carotid bruits. JVP not elevated. Lungs: Diminished BS with decreased inspiratory effort without wheezes, rales, or rhonchi. Breathing is unlabored. Heart: RRR S1 S2, + diastolic murmur, no rubs or gallops.  Abdomen: Soft, non-tender, non-distended with normoactive bowel sounds. No rebound/guarding. Extremities: No clubbing or cyanosis. No edema. Distal pedal pulses are 3+ and equal bilaterally. Neuro: Alert and oriented X 3. Moves all extremities spontaneously. Psych:  Responds to questions appropriately with a normal affect.   EKG:  The EKG was personally reviewed and demonstrates:  63bpm, appears to be NSR but also intermittent AV pacing and V pacing   Telemetry:  Telemetry was personally reviewed and demonstrates:  NSR, occasional V pacing  Relevant CV Studies: 2D echo 02/26/21  1. Left ventricular ejection fraction, by estimation, is 20 to 25%. The  left ventricle has severely decreased function. The left ventricle  demonstrates global hypokinesis. The left ventricular internal cavity size  was mildly dilated. There is mild left  ventricular hypertrophy. Left ventricular diastolic parameters are  consistent with Grade I diastolic dysfunction (impaired relaxation).   2. Right ventricular systolic function is severely reduced. The right  ventricular size is mildly enlarged.   3. Left atrial size was moderately dilated.   4. Pacer wire present. Right atrial  size was severely dilated.   5. The mitral valve is normal in structure. Mild mitral valve  regurgitation. No evidence of mitral stenosis.   6. Tricuspid valve regurgitation is moderate.   7. The aortic valve is tricuspid. Aortic valve regurgitation is moderate  to severe. No aortic stenosis is present.   8. Aortic dilatation noted. There is moderate dilatation of the ascending   aorta, measuring 45 mm.   9. The inferior vena cava is dilated in size with <50% respiratory  variability, suggesting right atrial pressure of 15 mmHg.   Comparison(s): Changes from prior study are noted. There is severe  biventricular dysfunction and moderate to severe aoritc valve insuffiency  with dialted ascending aorta since last study.   Laboratory Data:  High Sensitivity Troponin:  No results for input(s): TROPONINIHS in the last 720 hours.   Chemistry Recent Labs  Lab 03/18/21 1112 03/21/21 1303  NA 132* 133*  K 3.9 4.0  CL 96* 99  CO2 30 30  GLUCOSE 104* 88  BUN 10 9  CREATININE 0.60* 0.57*  CALCIUM 8.5* 8.5*  GFRNONAA >60 >60  ANIONGAP 6 4*    Recent Labs  Lab 03/21/21 1303  PROT 5.6*  ALBUMIN 3.2*  AST 25  ALT 23  ALKPHOS 59  BILITOT 1.0   Hematology Recent Labs  Lab 03/18/21 1112 03/21/21 1133  WBC 5.0 4.9  RBC 4.13* 3.81*  HGB 11.4* 10.7*  HCT 37.7* 35.0*  MCV 91.3 91.9  MCH 27.6 28.1  MCHC 30.2 30.6  RDW 17.2* 17.7*  PLT 294 208   BNPNo results for input(s): BNP, PROBNP in the last 168 hours.  DDimer No results for input(s): DDIMER in the last 168 hours.   Radiology/Studies:  DG Chest Portable 1 View  Result Date: 03/21/2021 CLINICAL DATA:  Hypotension. EXAM: PORTABLE CHEST 1 VIEW COMPARISON:  March 07, 2021. FINDINGS: Stable cardiomegaly. Left-sided pacemaker is unchanged in position. Lungs are clear. Bony thorax is unremarkable. IMPRESSION: No active disease. Electronically Signed   By: Marijo Conception M.D.   On: 03/21/2021 12:06   CUP PACEART REMOTE DEVICE CHECK  Result Date: 03/19/2021 Scheduled remote reviewed. Normal device function.  5 AHR episodes. Longest 4:16 min. Burden <0.1% 5 HVR episodes. Longest 7 sec Ave V rate 150's. Next remote 91 days.    Assessment and Plan:   1. Orthostatic hypotension - suspect medication related, we will hold his Entresto, carvedilol, Jardiance at this time, with plan to review again in AM -  per d/w MD, tentatively continue spironolactone (and digoxin, the latter of which does not impact BP)  2. Chronic HFrEF (etiology of cardiomyopathy not determined) - appears euvolemic - etiology not determined - cath deferred in light of age/comorbidities, recent GI bleeding issues - follows volume with med adjustment above - may need to consider CRT upgrade as OP if LV dysfunction does not improve with medication therapy  3. Moderate-severe aortic insufficiency with moderately dilated ascending aorta - see MD comments below  4. CAD s/p remote PCI to LAD 2003 - had brief transient CP yesterday - we will check troponins - anticipate possibly some demand leak but if troponins significantly elevated, will need to explore workup  5. H/o CHB s/p PPM - continue usual f/u  6. Recent iron deficiency anemia/GIB  -  managed conservatively - will increase protonix to recommended '40mg'$  BID dose (through 04/11/21 with plans to reduce to '40mg'$  daily thereafter) - will review plan for IV iron with MD  7.  Myasthenia gravis - per primary team  Risk Assessment/Risk Scores:        New York Heart Association (NYHA) Functional Class NYHA Class II      For questions or updates, please contact [facility] Please consult [email or web address] for contact info under    Signed, Charlie Pitter, PA-C  03/21/2021 4:48 PM  Personally seen and examined. Agree with APP above with the following comments: Briefly 85 yo M with a history of removed CAD s/p distant PCI, Moderate Thoracic Aortic Aneurysm, IDA and GIB and myasthenia gravis Patient notes that he feels no symptoms outside of one episode that radiated to his left arm, and dizziness twice with standing, which he described as mild.  Thought he denies shortness of breath, his wise notes that he has had notable breathing issues.   Exam notable for decreased breath sounds, +3 pulses and diastolic murmur.   Labs notable for stable creatinine.  Slight decrease  in Hgb; decrease in albumin. Personally reviewed relevant tests; LVEF ~ 25% with At least Moderate to severe AI without evidence of holodiastolic flow reversal.  Moderate Thoracic Aortic Aneurysm 4.8 Would recommend  - will trend troponin as above; low suspicion for Type I MI - will plan for more conservative escalation of GDMT; MRA and Dig tonight, likely to start SGLT2i tomorrow - concern that TAA and AI are the mechanism for new HFrEF; patient notes that he is ready to go to surgery if he ever needs it, but he has significant notable co-morbidities. - based on course, an outpatient Cardiac MRI/MRA for AI quantification and aortic assessment may be warranted; will defer at this time as I'm unclear patient would be a surgical candidate  Rudean Haskell, MD [place] [place], [place] [address] [phone]  6:53 PM

## 2021-03-22 DIAGNOSIS — I951 Orthostatic hypotension: Secondary | ICD-10-CM | POA: Diagnosis not present

## 2021-03-22 DIAGNOSIS — I5042 Chronic combined systolic (congestive) and diastolic (congestive) heart failure: Secondary | ICD-10-CM | POA: Diagnosis not present

## 2021-03-22 DIAGNOSIS — I351 Nonrheumatic aortic (valve) insufficiency: Secondary | ICD-10-CM | POA: Diagnosis not present

## 2021-03-22 DIAGNOSIS — I442 Atrioventricular block, complete: Secondary | ICD-10-CM | POA: Diagnosis not present

## 2021-03-22 LAB — CBC
HCT: 33.3 % — ABNORMAL LOW (ref 39.0–52.0)
Hemoglobin: 10.4 g/dL — ABNORMAL LOW (ref 13.0–17.0)
MCH: 27.9 pg (ref 26.0–34.0)
MCHC: 31.2 g/dL (ref 30.0–36.0)
MCV: 89.3 fL (ref 80.0–100.0)
Platelets: 208 10*3/uL (ref 150–400)
RBC: 3.73 MIL/uL — ABNORMAL LOW (ref 4.22–5.81)
RDW: 17.5 % — ABNORMAL HIGH (ref 11.5–15.5)
WBC: 4.6 10*3/uL (ref 4.0–10.5)
nRBC: 0 % (ref 0.0–0.2)

## 2021-03-22 LAB — BASIC METABOLIC PANEL
Anion gap: 7 (ref 5–15)
BUN: 10 mg/dL (ref 8–23)
CO2: 29 mmol/L (ref 22–32)
Calcium: 8.3 mg/dL — ABNORMAL LOW (ref 8.9–10.3)
Chloride: 97 mmol/L — ABNORMAL LOW (ref 98–111)
Creatinine, Ser: 0.54 mg/dL — ABNORMAL LOW (ref 0.61–1.24)
GFR, Estimated: >60 mL/min (ref >60)
Glucose, Bld: 84 mg/dL (ref 70–99)
Potassium: 3.9 mmol/L (ref 3.5–5.1)
Sodium: 133 mmol/L — ABNORMAL LOW (ref 135–145)

## 2021-03-22 LAB — GLUCOSE, CAPILLARY
Glucose-Capillary: 89 mg/dL (ref 70–99)
Glucose-Capillary: 94 mg/dL (ref 70–99)

## 2021-03-22 MED ORDER — EMPAGLIFLOZIN 10 MG PO TABS
10.0000 mg | ORAL_TABLET | Freq: Every day | ORAL | Status: DC
  Administered 2021-03-22: 10 mg via ORAL
  Filled 2021-03-22: qty 1

## 2021-03-22 NOTE — Plan of Care (Signed)

## 2021-03-22 NOTE — Evaluation (Signed)
Physical Therapy Evaluation & Discharge Patient Details Name: David [name]. MRN: [id number] DOB: [birth year] Today's Date: 03/22/2021   History of Present Illness  Pt is an 85 y.o. male admitted 03/21/21 with near syncope; workup for symptomatic hypotension. Of note, recent admission 03/06/21 with HF exacerbation, anemia. Other PMH includes myasthenia gravis, CAD, HTN, DM2, HLD, spinal stenosis.   Clinical Impression  Patient evaluated by Physical Therapy with no further acute PT needs identified. PTA, pt mod indep ambulating with RW; lives with wife who assists with ADL/iADLs as needed. Today, pt mod indep with transfers and ambulating using RW; denies dizziness with activity. All education has been completed and the patient has no further questions. Acute PT is signing off. Thank you for this referral.  Orthostatic BPs Sitting (rest) 127/71  Standing 138/117 (Unsure if reliable as pt moving UE)  Post-ambulation 119/66       Follow Up Recommendations No PT follow up;Supervision for mobility/OOB    Equipment Recommendations  None recommended by PT    Recommendations for Other Services       Precautions / Restrictions Precautions Precautions: Fall Restrictions Weight Bearing Restrictions: No      Mobility  Bed Mobility               General bed mobility comments: Received sitting in recliner    Transfers Overall transfer level: Modified independent Equipment used: Rolling walker (2 wheeled) Transfers: Sit to/from Stand              Ambulation/Gait Ambulation/Gait assistance: Modified independent (Device/Increase time) Gait Distance (Feet): 100 Feet Assistive device: Rolling walker (2 wheeled) Gait Pattern/deviations: Step-through pattern;Decreased stride length Gait velocity: Decreased   General Gait Details: Slow steady gait mod indep with RW; pt declined hallway ambulation not wanting to wear face mask; ambulated multiple laps in room;  asymptomatic  Stairs            Wheelchair Mobility    Modified Rankin (Stroke Patients Only)       Balance Overall balance assessment: Mild deficits observed, not formally tested                                           Pertinent Vitals/Pain Pain Assessment: No/denies pain    Home Living Family/patient expects to be discharged to:: Private residence Living Arrangements: Spouse/significant other Available Help at Discharge: Family;Available 24 hours/day Type of Home: House Home Access: Stairs to enter Entrance Stairs-Rails: Right Entrance Stairs-Number of Steps: 3 Home Layout: One level Home Equipment: Cane - single point;Shower seat;Walker - 2 wheels      Prior Function Level of Independence: Needs assistance   Gait / Transfers Assistance Needed: Mod indep ambulating with RW since recent admission; assist from wife on stairs. Still works some part-time with insurance  ADL's / [employer] Assistance Needed: Pt indep with dressing, able to wash body, wife assists with washing hair; wife does cooking and household tasks; pt wears depends due to bowel incontinence        Hand Dominance        Extremity/Trunk Assessment   Upper Extremity Assessment Upper Extremity Assessment: Generalized weakness    Lower Extremity Assessment Lower Extremity Assessment: Generalized weakness       Communication   Communication: HOH  Cognition Arousal/Alertness: Awake/alert Behavior During Therapy: WFL for tasks assessed/performed Overall Cognitive Status: Within Functional Limits for tasks assessed  General Comments: WFL for simple tasks, not formally assessed; requires some repetition and appreciates tasks being explained; likely some STM deficits      General Comments General comments (skin integrity, edema, etc.): When asked about pre-syncopal episode leading to admission, pt states, "You tell one  doctor you felt sort of dizzy one time and you get branded with the word 'dizziness'"    Exercises     Assessment/Plan    PT Assessment Patent does not need any further PT services  PT Problem List         PT Treatment Interventions      PT Goals (Current goals can be found in the Care Plan section)  Acute Rehab PT Goals PT Goal Formulation: All assessment and education complete, DC therapy    Frequency     Barriers to discharge        Co-evaluation               AM-PAC PT "6 Clicks" Mobility  Outcome Measure Help needed turning from your back to your side while in a flat bed without using bedrails?: None Help needed moving from lying on your back to sitting on the side of a flat bed without using bedrails?: None Help needed moving to and from a bed to a chair (including a wheelchair)?: None Help needed standing up from a chair using your arms (e.g., wheelchair or bedside chair)?: None Help needed to walk in hospital room?: None Help needed climbing 3-5 steps with a railing? : A Little 6 Click Score: 23    End of Session   Activity Tolerance: Patient tolerated treatment well Patient left: in chair;with call bell/phone within reach;with chair alarm set Nurse Communication: Mobility status PT Visit Diagnosis: Other abnormalities of gait and mobility (R26.89)    Time: [id number] PT Time Calculation (min) (ACUTE ONLY): 20 min   Charges:   PT Evaluation $PT Eval Low Complexity: [place], PT, DPT Acute Rehabilitation Services  Pager [phone] Office [place] 03/22/2021, 9:44 AM

## 2021-03-22 NOTE — Progress Notes (Signed)
Patient and wife instructed on discharge instructions, follow up appointments, and medications.  Answered all questions.  Patient ate lunch, dressed and all belongings packed and taken with patient at this time.  Patient wheeled in wheelchair to meet wife with personal car.  No s/s of distress noted denies pain.

## 2021-03-22 NOTE — Progress Notes (Signed)
Progress Note  Patient Name: David [name]. Date of Encounter: 03/22/2021  Primary Cardiologist: Kirk Ruths, MD   Subjective   Feels better.  Able to ambulate without issue.  Inpatient Medications    Scheduled Meds:  aspirin EC  81 mg Oral Daily   desmopressin  0.1 mg Oral QHS   digoxin  0.125 mg Oral Daily   enoxaparin (LOVENOX) injection  40 mg Subcutaneous Q24H   ezetimibe  10 mg Oral Daily   And   simvastatin  40 mg Oral Daily   ferrous sulfate  325 mg Oral BID WC   loratadine  10 mg Oral Daily   mirabegron ER  50 mg Oral Daily   mycophenolate  500 mg Oral BID   pantoprazole  40 mg Oral BID   pyridostigmine  120 mg Oral Q6H   saccharomyces boulardii  250 mg Oral BID   spironolactone  25 mg Oral Daily   tamsulosin  0.4 mg Oral Daily   Continuous Infusions:  PRN Meds: feeding supplement, polyvinyl alcohol, traZODone   Vital Signs    Vitals:   03/21/21 2339 03/22/21 0415 03/22/21 0500 03/22/21 0800  BP: (!) 105/49 125/60  (!) 143/70  Pulse: 62 62  63  Resp: 20 20    Temp: 98 F (36.7 C) 97.6 F (36.4 C)  (!) 97.4 F (36.3 C)  TempSrc: Oral Oral  Oral  SpO2: 97% 94%  98%  Weight:   63.6 kg   Height:        Intake/Output Summary (Last 24 hours) at 03/22/2021 1013 Last data filed at 03/22/2021 0805 Gross per 24 hour  Intake 740 ml  Output 400 ml  Net 340 ml   Filed Weights   03/21/21 1128 03/22/21 0500  Weight: 64.5 kg 63.6 kg    Telemetry    SR with A and V pacing - Personally Reviewed  ECG    A paced V sensed rate 63 (03/21/21) - Personally Reviewed  Physical Exam   GEN: No acute distress.   Neck: No JVD Cardiac: RRR, +3 radial pulse; diastolic heart murmur Respiratory: Clear to auscultation bilaterally. GI: Soft, nontender, non-distended  MS: No edema; No deformity. Neuro:  Nonfocal  Psych: Normal affect   Labs    Chemistry Recent Labs  Lab 03/18/21 1112 03/21/21 1303 03/22/21 0321  NA 132* 133* 133*  K 3.9 4.0  3.9  CL 96* 99 97*  CO2 '30 30 29  '$ GLUCOSE 104* 88 84  BUN '10 9 10  '$ CREATININE 0.60* 0.57* 0.54*  CALCIUM 8.5* 8.5* 8.3*  PROT  --  5.6*  --   ALBUMIN  --  3.2*  --   AST  --  25  --   ALT  --  23  --   ALKPHOS  --  59  --   BILITOT  --  1.0  --   GFRNONAA >60 >60 >60  ANIONGAP 6 4* 7     Hematology Recent Labs  Lab 03/18/21 1112 03/21/21 1133 03/22/21 0321  WBC 5.0 4.9 4.6  RBC 4.13* 3.81* 3.73*  HGB 11.4* 10.7* 10.4*  HCT 37.7* 35.0* 33.3*  MCV 91.3 91.9 89.3  MCH 27.6 28.1 27.9  MCHC 30.2 30.6 31.2  RDW 17.2* 17.7* 17.5*  PLT 294 208 208    Cardiac EnzymesNo results for input(s): TROPONINI in the last 168 hours. No results for input(s): TROPIPOC in the last 168 hours.   BNPNo results for input(s): BNP, PROBNP in  the last 168 hours.   DDimer No results for input(s): DDIMER in the last 168 hours.   Radiology    DG Chest Portable 1 View  Result Date: 03/21/2021 CLINICAL DATA:  Hypotension. EXAM: PORTABLE CHEST 1 VIEW COMPARISON:  March 07, 2021. FINDINGS: Stable cardiomegaly. Left-sided pacemaker is unchanged in position. Lungs are clear. Bony thorax is unremarkable. IMPRESSION: No active disease. Electronically Signed   By: Marijo Conception M.D.   On: 03/21/2021 12:06    Cardiac Studies   2D echo 02/26/21  1. Left ventricular ejection fraction, by estimation, is 20 to 25%. The  left ventricle has severely decreased function. The left ventricle  demonstrates global hypokinesis. The left ventricular internal cavity size  was mildly dilated. There is mild left  ventricular hypertrophy. Left ventricular diastolic parameters are  consistent with Grade I diastolic dysfunction (impaired relaxation).   2. Right ventricular systolic function is severely reduced. The right  ventricular size is mildly enlarged.   3. Left atrial size was moderately dilated.   4. Pacer wire present. Right atrial size was severely dilated.   5. The mitral valve is normal in structure. Mild  mitral valve  regurgitation. No evidence of mitral stenosis.   6. Tricuspid valve regurgitation is moderate.   7. The aortic valve is tricuspid. Aortic valve regurgitation is moderate  to severe. No aortic stenosis is present.   8. Aortic dilatation noted. There is moderate dilatation of the ascending  aorta, measuring 45 mm.   9. The inferior vena cava is dilated in size with <50% respiratory  variability, suggesting right atrial pressure of 15 mmHg.   Comparison(s): Changes from prior study are noted. There is severe  biventricular dysfunction and moderate to severe aoritc valve insuffiency  with dialted ascending aorta since last study.   Patient Profile     85 y.o. male with a history of removed CAD s/p distant PCI, Moderate Thoracic Aortic Aneurysm, IDA and GIB and myasthenia gravis  Assessment & Plan    Chronic HFrEF (etiology of cardiomyopathy not determined) Orthostatic hypotension CHB s/p PPM Moderate-severe aortic insufficiency with moderately dilated ascending aorta - appears euvolemic - etiology not determined but AI related is on DDX - depending on long term goals, CRT as outpatient could be considered - starting Jardiance 10 mg PO daily today; if BP tolerates will add back Low dose Entresto as outpatient - with concomitant AI will add BB last unless new ectopy - will continue MRA and dig - see initial consult note; outpatient CMR and MR Aorta could be considered for etiology of HF   CAD s/p remote PCI to LAD 2003 - had brief transient CP 03/20/21 - troponin not suggestive of MI    Recent iron deficiency anemia/GIB  -  managed conservatively -  protonix '40mg'$  BID dose   - discussed with pharmacy team; reasonable for IV iron if appropriate per primary; will discussed with primary  Myasthenia gravis - discussed with pharmacy team; no evidence of significant interactions with pyridostigmine  Patient is feeling better and is nearing discharge; will arrange follow  up  For questions or updates, please contact [facility] Please consult [email or web address] for contact info under Cardiology/STEMI.      Signed, Werner Lean, MD  03/22/2021, 10:13 AM

## 2021-03-22 NOTE — Discharge Summary (Signed)
Physician Discharge Summary  David [name]. [id number] DOB: [birth year] DOA: 03/21/2021  PCP: [name], MD  Admit date: 03/21/2021 Discharge date: 03/22/2021  Admitted From: Home Disposition:  Home  Discharge Condition:Stable CODE STATUS:FULL Diet recommendation: Heart Healthy   Brief/Interim Summary: David L Kayden Gustafson. is a 85 y.o. male with medical history significant of chronic combined systolic and diastolic CHF, LVEF 20 to [id number], 02/2021, Myasthenia gravis, third-degree AV block status post PPM, IIDM, BPH, HLD, presented with symptomatic hypotension.  Patient was recently diagnosed with combined systolic and diastolic CHF in August, during that admission, patient received aggressive diuresis and about 5 L of net weight loss.  And patient was discharged with CHF medications including Coreg, Aldactone, Entresto.  Reported that patient has been tolerating CHF medication fairly well until this week, when and had 1 episode of lightheadedness.  On presentation he was hypotensive.  He was given IV fluid boluses.  He was also orthostatic.  Cardiology was consulted. Orthostatics vitals are negative this morning.  Home medications have been adjusted.  He remains very comfortable today, hemodynamically stable.  Blood pressure is much better.  PT evaluated him and recommended no follow-up.  He is medically stable for discharge to home today.  He will follow-up with cardiology as an outpatient.  Following problems were addressed during his hospitalization:  Near syncope/symptomatic hypotension -Likely iatrogenic from CHF medications. -Blood pressure better today.  He feels comfortable.  PT evaluated him, no orthostatics noted. -He will follow-up with cardiology as an outpatient.  Entresto discontinued, carvedilol discontinued.  Might be started as an outpatient  Chronic CHF: Looks euvolemic.  LVEF 20 to 25%.Follow up with cardiology as an outpatient   Myasthenia Gravis -No vision  problems, denied any muscle weakness or generalized weakness no breathing problems. -Continue pyridostigmine and CellCept  H/O iron defiency anemia: Continue oral supplementation,takes intermittent IV iron infusion   Moderate to severe aortic regurgitation -Outpatient follow-up with cardiology.   Severe protein calorie malnutrition -On protein supplement.   IIDM -Continue Jardiance   BPH -Continue Flomax     Discharge Diagnoses:  Active Problems:   Hypotension    Discharge Instructions  Discharge Instructions     Diet - low sodium heart healthy   Complete by: As directed    Discharge instructions   Complete by: As directed    1)please take your medications as instructed 2)Follow up with your PCP in 1 to 2 weeks 3)You will be called oncology for follow-up appointment.  Monitor your blood pressure at home   Increase activity slowly   Complete by: As directed       Allergies as of 03/22/2021       Reactions   Azithromycin Other (See Comments)   Clindamycin Hcl Other (See Comments)   Doxycycline Diarrhea   Doxycycline Hyclate Other (See Comments)   Levofloxacin Other (See Comments)   Niacin Other (See Comments)   Gris-peg [griseofulvin] Other (See Comments)   headaches   Terbinafine Other (See Comments)   headache        Medication List     STOP taking these medications    carvedilol 3.125 MG tablet Commonly known as: COREG   Entresto 24-26 MG Generic drug: sacubitril-valsartan       TAKE these medications    aspirin EC 81 MG tablet Take 81 mg by mouth daily.   Benefiber Powd Take 2 Scoops by mouth every other day.   desmopressin 0.1 MG tablet Commonly known as: DDAVP TAKE 1  TABLET(0.1 MG) BY MOUTH AT BEDTIME What changed: See the new instructions.   digoxin 0.125 MG tablet Commonly known as: LANOXIN Take 1 tablet (0.125 mg total) by mouth daily.   Ensure Take 237 mLs by mouth daily as needed (feeding suppliment).    ezetimibe-simvastatin 10-40 MG tablet Commonly known as: VYTORIN Take 1 tablet by mouth daily.   FeroSul 325 (65 FE) MG tablet Generic drug: ferrous sulfate Take 1 tablet (325 mg total) by mouth 2 (two) times daily with a meal.   furosemide 20 MG tablet Commonly known as: Lasix Take 1 tablet (20 mg total) by mouth as needed for edema (for weight gain of 2-3lb and ankle edema).   Jardiance 10 MG Tabs tablet Generic drug: empagliflozin Take 1 tablet (10 mg total) by mouth daily.   loratadine 10 MG tablet Commonly known as: CLARITIN Take 10 mg by mouth daily.   mycophenolate 250 MG capsule Commonly known as: CELLCEPT TAKE 2 CAPSULES BY MOUTH TWICE DAILY   Myrbetriq 50 MG Tb24 tablet Generic drug: mirabegron ER Take 50 mg by mouth daily.   nitroGLYCERIN 0.4 MG SL tablet Commonly known as: NITROSTAT Place 1 tablet (0.4 mg total) under the tongue every 5 (five) minutes as needed for chest pain. Reported on 11/01/2015   pantoprazole 40 MG tablet Commonly known as: Protonix '40mg'$  twice daily until 04/11/21, then reduce to '40mg'$  daily thereafter What changed:  how much to take how to take this when to take this   pyridostigmine 60 MG tablet Commonly known as: Mestinon Take 2 tablets by mouth four times daily What changed:  how much to take how to take this when to take this additional instructions   saccharomyces boulardii 250 MG capsule Commonly known as: FLORASTOR Take 250 mg by mouth 2 (two) times daily.   spironolactone 25 MG tablet Commonly known as: ALDACTONE Take 1 tablet (25 mg total) by mouth daily.   Systane 0.4-0.3 % Soln Generic drug: Polyethyl Glycol-Propyl Glycol Place 1 drop into both eyes daily as needed (dry eyes).   tamsulosin 0.4 MG Caps capsule Commonly known as: FLOMAX Take 1 capsule (0.4 mg total) by mouth daily.   traZODone 50 MG tablet Commonly known as: DESYREL Take 0.5-1 tablets (25-50 mg total) by mouth at bedtime as needed for  sleep.        Follow-up Information     [name], MD. Schedule an appointment as soon as possible for a visit in 1 week(s).   Specialty: Internal Medicine Contact information: [place] [address] [phone]                Allergies  Allergen Reactions   Azithromycin Other (See Comments)   Clindamycin Hcl Other (See Comments)   Doxycycline Diarrhea   Doxycycline Hyclate Other (See Comments)   Levofloxacin Other (See Comments)   Niacin Other (See Comments)   Gris-Peg [Griseofulvin] Other (See Comments)    headaches   Terbinafine Other (See Comments)    headache    Consultations: Cardiology   Procedures/Studies: US RENAL  Result Date: 03/07/2021 CLINICAL DATA:  Acute kidney injury EXAM: RENAL / URINARY TRACT ULTRASOUND COMPLETE COMPARISON:  CT 08/23/2019 FINDINGS: Right Kidney: Renal measurements: 13 x 4.8 x 4 cm = volume: 129.4 mL. Echogenicity within normal limits. No hydronephrosis. Multiple cysts, fewer than 10. The largest cyst is seen at the midpole and measures 2.9 cm. Left Kidney: Renal measurements: 14.4 x 5.9 x 4.1 cm = volume: 184.5 mL.  Echogenicity within normal limits. No hydronephrosis. Multiple, fewer than 10 cysts. The largest is seen within the mid to lower pole and measures 4.7 cm. Bladder: Foley catheter in the decompressed urinary bladder Other: None. IMPRESSION: 1. Bilateral renal cysts. Otherwise negative ultrasound appearance of the kidneys. Electronically Signed   By: Donavan Foil M.D.   On: 03/07/2021 16:13   DG Chest Portable 1 View  Result Date: 03/21/2021 CLINICAL DATA:  Hypotension. EXAM: PORTABLE CHEST 1 VIEW COMPARISON:  March 07, 2021. FINDINGS: Stable cardiomegaly. Left-sided pacemaker is unchanged in position. Lungs are clear. Bony thorax is unremarkable. IMPRESSION: No active disease. Electronically Signed   By: Marijo Conception M.D.   On: 03/21/2021 12:06   DG Chest Port 1 View  Result Date:  03/07/2021 CLINICAL DATA:  Congestive heart failure EXAM: PORTABLE CHEST 1 VIEW COMPARISON:  11/26/2020 FINDINGS: Cardiac enlargement. Dual lead pacemaker unchanged. Negative for heart failure or edema. Mild elevation right hemidiaphragm unchanged with mild right lower lobe atelectasis. No pleural effusion identified. IMPRESSION: Negative for heart failure. Mild right lower lobe atelectasis unchanged. Electronically Signed   By: Franchot Gallo M.D.   On: 03/07/2021 08:10   ECHOCARDIOGRAM COMPLETE  Result Date: 02/26/2021    ECHOCARDIOGRAM REPORT   Patient Name:   David Gildea. Date of Exam: 02/26/2021 Medical Rec #:  [id number]             Height:       69.0 in Accession #:    [id number]            Weight:       167.2 lb Date of Birth:  1936-02-18             BSA:          1.915 m Patient Age:    41 years              BP:           150/84 mmHg Patient Gender: M                     HR:           79 bpm. Exam Location:  [facility] Procedure: 2D Echo, Cardiac Doppler and Color Doppler Indications:    R06.02 Shortness of breath  History:        Patient has prior history of Echocardiogram examinations, most                 recent 10/23/2016. Pacemaker; Risk Factors:Hypertension, Diabetes                 and Dyslipidemia. Obstructive sleep apnea. NSVT. LBBB. Coronary                 artery disease.  Sonographer:    Wilford Sports Rodgers-Jones RDCS Referring Phys: [place]  1. Left ventricular ejection fraction, by estimation, is 20 to 25%. The left ventricle has severely decreased function. The left ventricle demonstrates global hypokinesis. The left ventricular internal cavity size was mildly dilated. There is mild left ventricular hypertrophy. Left ventricular diastolic parameters are consistent with Grade I diastolic dysfunction (impaired relaxation).  2. Right ventricular systolic function is severely reduced. The right ventricular size is mildly enlarged.  3. Left atrial size was  moderately dilated.  4. Pacer wire present. Right atrial size was severely dilated.  5. The mitral valve is normal in structure. Mild mitral valve regurgitation. No evidence of mitral  stenosis.  6. Tricuspid valve regurgitation is moderate.  7. The aortic valve is tricuspid. Aortic valve regurgitation is moderate to severe. No aortic stenosis is present.  8. Aortic dilatation noted. There is moderate dilatation of the ascending aorta, measuring 45 mm.  9. The inferior vena cava is dilated in size with <50% respiratory variability, suggesting right atrial pressure of 15 mmHg. Comparison(s): Changes from prior study are noted. There is severe biventricular dysfunction and moderate to severe aoritc valve insuffiency with dialted ascending aorta since last study. FINDINGS  Left Ventricle: Left ventricular ejection fraction, by estimation, is 20 to 25%. The left ventricle has severely decreased function. The left ventricle demonstrates global hypokinesis. The left ventricular internal cavity size was mildly dilated. There is mild left ventricular hypertrophy. Abnormal (paradoxical) septal motion, consistent with RV pacemaker. Left ventricular diastolic parameters are consistent with Grade I diastolic dysfunction (impaired relaxation). Right Ventricle: The right ventricular size is mildly enlarged. No increase in right ventricular wall thickness. Right ventricular systolic function is severely reduced. Left Atrium: Left atrial size was moderately dilated. Right Atrium: Pacer wire present. Right atrial size was severely dilated. Pericardium: There is no evidence of pericardial effusion. Mitral Valve: The mitral valve is normal in structure. Mild mitral valve regurgitation. No evidence of mitral valve stenosis. Tricuspid Valve: The tricuspid valve is normal in structure. Tricuspid valve regurgitation is moderate . No evidence of tricuspid stenosis. Aortic Valve: The aortic valve is tricuspid. Aortic valve regurgitation is  moderate to severe. Aortic regurgitation PHT measures 370 msec. No aortic stenosis is present. Pulmonic Valve: The pulmonic valve was normal in structure. Pulmonic valve regurgitation is mild. No evidence of pulmonic stenosis. Aorta: The aortic root is normal in size and structure and aortic dilatation noted. There is moderate dilatation of the ascending aorta, measuring 45 mm. Venous: The inferior vena cava is dilated in size with less than 50% respiratory variability, suggesting right atrial pressure of 15 mmHg. IAS/Shunts: No atrial level shunt detected by color flow Doppler. Additional Comments: A device lead is visualized.  LEFT VENTRICLE PLAX 2D LVIDd:         5.80 cm  Diastology LVIDs:         4.90 cm  LV e' medial:    4.90 cm/s LV PW:         1.20 cm  LV E/e' medial:  11.4 LV IVS:        1.20 cm  LV e' lateral:   7.51 cm/s LVOT diam:     2.40 cm  LV E/e' lateral: 7.5 LV SV:         55 LV SV Index:   29 LVOT Area:     4.52 cm  RIGHT VENTRICLE            IVC RV Basal diam:  5.20 cm    IVC diam: 3.20 cm RV S prime:     9.89 cm/s TAPSE (M-mode): 1.4 cm LEFT ATRIUM             Index       RIGHT ATRIUM           Index LA diam:        5.10 cm 2.66 cm/m  RA Area:     27.00 cm LA Vol (A2C):   94.0 ml 49.10 ml/m RA Volume:   97.00 ml  50.66 ml/m LA Vol (A4C):   62.5 ml 32.64 ml/m LA Biplane Vol: 81.1 ml 42.36 ml/m  AORTIC VALVE LVOT Vmax:  71.65 cm/s LVOT Vmean:  49.850 cm/s LVOT VTI:    0.122 m AI PHT:      370 msec  AORTA Ao Root diam: 4.50 cm Ao Asc diam:  4.50 cm MITRAL VALVE               TRICUSPID VALVE MV Decel Time: 261 msec    TR Peak grad:   52.7 mmHg MV E velocity: 56.10 cm/s  TR Vmax:        363.00 cm/s MV A velocity: 66.40 cm/s MV E/A ratio:  0.84        SHUNTS                            Systemic VTI:  0.12 m                            Systemic Diam: 2.40 cm Glori Bickers MD Electronically signed by Glori Bickers MD Signature Date/Time: 02/26/2021/5:29:16 PM    Final    CUP PACEART REMOTE  DEVICE CHECK  Result Date: 03/19/2021 Scheduled remote reviewed. Normal device function.  5 AHR episodes. Longest 4:16 min. Burden <0.1% 5 HVR episodes. Longest 7 sec Ave V rate 150's. Next remote 91 days.     Subjective:  Patient seen and examined at the bedside this morning.  Hemodynamically stable for discharge today.Called and discussed with wife on phone today and discussed about dc planning   Discharge Exam: Vitals:   03/22/21 0415 03/22/21 0800  BP: 125/60 (!) 143/70  Pulse: 62 63  Resp: 20   Temp: 97.6 F (36.4 C) (!) 97.4 F (36.3 C)  SpO2: 94% 98%   Vitals:   03/21/21 2339 03/22/21 0415 03/22/21 0500 03/22/21 0800  BP: (!) 105/49 125/60  (!) 143/70  Pulse: 62 62  63  Resp: 20 20    Temp: 98 F (36.7 C) 97.6 F (36.4 C)  (!) 97.4 F (36.3 C)  TempSrc: Oral Oral  Oral  SpO2: 97% 94%  98%  Weight:   63.6 kg   Height:        General: Pt is alert, awake, not in acute distress Cardiovascular: RRR, S1/S2 +, no rubs, no gallops Respiratory: CTA bilaterally, no wheezing, no rhonchi Abdominal: Soft, NT, ND, bowel sounds + Extremities: no edema, no cyanosis    The results of significant diagnostics from this hospitalization (including imaging, microbiology, ancillary and laboratory) are listed below for reference.     Microbiology: No results found for this or any previous visit (from the past 240 hour(s)).   Labs: BNP (last 3 results) Recent Labs    03/06/21 2209  BNP 123456*   Basic Metabolic Panel: Recent Labs  Lab 03/18/21 1112 03/21/21 1303 03/22/21 0321  NA 132* 133* 133*  K 3.9 4.0 3.9  CL 96* 99 97*  CO2 '30 30 29  '$ GLUCOSE 104* 88 84  BUN '10 9 10  '$ CREATININE 0.60* 0.57* 0.54*  CALCIUM 8.5* 8.5* 8.3*   Liver Function Tests: Recent Labs  Lab 03/21/21 1303  AST 25  ALT 23  ALKPHOS 59  BILITOT 1.0  PROT 5.6*  ALBUMIN 3.2*   No results for input(s): LIPASE, AMYLASE in the last 168 hours. No results for input(s): AMMONIA in the  last 168 hours. CBC: Recent Labs  Lab 03/18/21 1112 03/21/21 1133 03/22/21 0321  WBC 5.0 4.9 4.6  NEUTROABS  --  3.1  --  HGB 11.4* 10.7* 10.4*  HCT 37.7* 35.0* 33.3*  MCV 91.3 91.9 89.3  PLT 294 208 208   Cardiac Enzymes: No results for input(s): CKTOTAL, CKMB, CKMBINDEX, TROPONINI in the last 168 hours. BNP: Invalid input(s): POCBNP CBG: Recent Labs  Lab 03/21/21 2036 03/22/21 0644 03/22/21 1105  GLUCAP 181* 89 94   D-Dimer No results for input(s): DDIMER in the last 72 hours. Hgb A1c No results for input(s): HGBA1C in the last 72 hours. Lipid Profile No results for input(s): CHOL, HDL, LDLCALC, TRIG, CHOLHDL, LDLDIRECT in the last 72 hours. Thyroid function studies No results for input(s): TSH, T4TOTAL, T3FREE, THYROIDAB in the last 72 hours.  Invalid input(s): FREET3 Anemia work up No results for input(s): VITAMINB12, FOLATE, FERRITIN, TIBC, IRON, RETICCTPCT in the last 72 hours. Urinalysis    Component Value Date/Time   COLORURINE YELLOW 03/21/2021 [place] 03/21/2021 1134   LABSPEC 1.006 03/21/2021 1134   PHURINE 7.0 03/21/2021 1134   GLUCOSEU >=500 (A) 03/21/2021 1134   HGBUR NEGATIVE 03/21/2021 1134   BILIRUBINUR NEGATIVE 03/21/2021 1134   KETONESUR NEGATIVE 03/21/2021 1134   PROTEINUR NEGATIVE 03/21/2021 1134   NITRITE NEGATIVE 03/21/2021 1134   LEUKOCYTESUR NEGATIVE 03/21/2021 1134   Sepsis Labs Invalid input(s): PROCALCITONIN,  WBC,  LACTICIDVEN Microbiology No results found for this or any previous visit (from the past 240 hour(s)).  Please note: You were cared for by a hospitalist during your hospital stay. Once you are discharged, your primary care physician will handle any further medical issues. Please note that NO REFILLS for any discharge medications will be authorized once you are discharged, as it is imperative that you return to your primary care physician (or establish a relationship with a primary care physician if  you do not have one) for your post hospital discharge needs so that they can reassess your need for medications and monitor your lab values.    Time coordinating discharge: 40 minutes  SIGNED:   Shelly Coss, MD  [facility] 03/22/2021, 11:19 AM Pager [phone]  If 7PM-7AM, please contact night-coverage [email or web address] Password [email or web address]

## 2021-03-22 NOTE — TOC Progression Note (Signed)
Transition of Care (TOC) - Progression Note    Patient Details  Name: David [name]. MRN: [id number] Date of Birth: 1935-09-10  Transition of Care [facility]) CM/SW Contact  Zenon Mayo, RN Phone Number: 03/22/2021, 12:33 PM  Clinical Narrative:    Patient is for dc today, he has a scale and bp cuff at home, he tries to watch what he eats and will try to do better. Wife her at bedside and will transport him home.        Expected Discharge Plan and Services           Expected Discharge Date: 03/22/21                                     Social Determinants of Health (SDOH) Interventions    Readmission Risk Interventions No flowsheet data found.

## 2021-03-22 NOTE — Progress Notes (Signed)
RT NOTES: NIF: -25, VC 2.0L. Pt with great effort.

## 2021-03-25 ENCOUNTER — Other Ambulatory Visit (HOSPITAL_COMMUNITY): Payer: Self-pay | Admitting: *Deleted

## 2021-03-26 ENCOUNTER — Ambulatory Visit (HOSPITAL_COMMUNITY)
Admission: RE | Admit: 2021-03-26 | Discharge: 2021-03-26 | Disposition: A | Discharge status: Home / Self Care | Payer: Medicare Other | Source: Ambulatory Visit | Attending: Internal Medicine | Admitting: Internal Medicine

## 2021-03-26 DIAGNOSIS — D509 Iron deficiency anemia, unspecified: Secondary | ICD-10-CM | POA: Insufficient documentation

## 2021-03-26 MED ORDER — SODIUM CHLORIDE 0.9 % IV SOLN
510.0000 mg | Freq: Once | INTRAVENOUS | Status: AC
  Administered 2021-03-26: 510 mg via INTRAVENOUS
  Filled 2021-03-26: qty 510

## 2021-03-27 ENCOUNTER — Other Ambulatory Visit (HOSPITAL_COMMUNITY): Payer: Self-pay

## 2021-03-27 DIAGNOSIS — E118 Type 2 diabetes mellitus with unspecified complications: Secondary | ICD-10-CM | POA: Diagnosis not present

## 2021-03-27 DIAGNOSIS — E7800 Pure hypercholesterolemia, unspecified: Secondary | ICD-10-CM | POA: Diagnosis not present

## 2021-03-27 DIAGNOSIS — I10 Essential (primary) hypertension: Secondary | ICD-10-CM | POA: Diagnosis not present

## 2021-03-27 DIAGNOSIS — I2510 Atherosclerotic heart disease of native coronary artery without angina pectoris: Secondary | ICD-10-CM | POA: Diagnosis not present

## 2021-04-03 ENCOUNTER — Ambulatory Visit: Payer: Medicare Other | Admitting: General Practice

## 2021-04-03 ENCOUNTER — Other Ambulatory Visit: Payer: Self-pay | Admitting: Gastroenterology

## 2021-04-03 DIAGNOSIS — R634 Abnormal weight loss: Secondary | ICD-10-CM | POA: Diagnosis not present

## 2021-04-03 DIAGNOSIS — D649 Anemia, unspecified: Secondary | ICD-10-CM

## 2021-04-03 DIAGNOSIS — K921 Melena: Secondary | ICD-10-CM | POA: Diagnosis not present

## 2021-04-03 DIAGNOSIS — R197 Diarrhea, unspecified: Secondary | ICD-10-CM

## 2021-04-03 DIAGNOSIS — E611 Iron deficiency: Secondary | ICD-10-CM | POA: Diagnosis not present

## 2021-04-03 NOTE — Progress Notes (Signed)
Remote pacemaker transmission.   

## 2021-04-17 DIAGNOSIS — Z23 Encounter for immunization: Secondary | ICD-10-CM | POA: Diagnosis not present

## 2021-04-17 DIAGNOSIS — E441 Mild protein-calorie malnutrition: Secondary | ICD-10-CM | POA: Diagnosis not present

## 2021-04-17 DIAGNOSIS — E611 Iron deficiency: Secondary | ICD-10-CM | POA: Diagnosis not present

## 2021-04-19 ENCOUNTER — Ambulatory Visit
Admission: RE | Admit: 2021-04-19 | Discharge: 2021-04-19 | Disposition: A | Discharge status: Home / Self Care | Payer: Medicare Other | Source: Ambulatory Visit | Attending: Gastroenterology | Admitting: Gastroenterology

## 2021-04-19 ENCOUNTER — Encounter (HOSPITAL_BASED_OUTPATIENT_CLINIC_OR_DEPARTMENT_OTHER): Payer: Self-pay | Admitting: Family

## 2021-04-19 ENCOUNTER — Other Ambulatory Visit: Payer: Self-pay

## 2021-04-19 ENCOUNTER — Ambulatory Visit (HOSPITAL_BASED_OUTPATIENT_CLINIC_OR_DEPARTMENT_OTHER): Payer: Medicare Other | Admitting: Family

## 2021-04-19 VITALS — BP 140/76 | HR 78 | Ht 69.0 in | Wt 143.1 lb

## 2021-04-19 DIAGNOSIS — I25118 Atherosclerotic heart disease of native coronary artery with other forms of angina pectoris: Secondary | ICD-10-CM | POA: Diagnosis not present

## 2021-04-19 DIAGNOSIS — Z87442 Personal history of urinary calculi: Secondary | ICD-10-CM | POA: Diagnosis not present

## 2021-04-19 DIAGNOSIS — Z950 Presence of cardiac pacemaker: Secondary | ICD-10-CM | POA: Diagnosis not present

## 2021-04-19 DIAGNOSIS — I5022 Chronic systolic (congestive) heart failure: Secondary | ICD-10-CM

## 2021-04-19 DIAGNOSIS — D500 Iron deficiency anemia secondary to blood loss (chronic): Secondary | ICD-10-CM | POA: Diagnosis not present

## 2021-04-19 DIAGNOSIS — R634 Abnormal weight loss: Secondary | ICD-10-CM

## 2021-04-19 DIAGNOSIS — R197 Diarrhea, unspecified: Secondary | ICD-10-CM

## 2021-04-19 DIAGNOSIS — D649 Anemia, unspecified: Secondary | ICD-10-CM

## 2021-04-19 DIAGNOSIS — Z79899 Other long term (current) drug therapy: Secondary | ICD-10-CM

## 2021-04-19 DIAGNOSIS — Z85820 Personal history of malignant melanoma of skin: Secondary | ICD-10-CM | POA: Diagnosis not present

## 2021-04-19 MED ORDER — ENTRESTO 24-26 MG PO TABS: 1.0000 | ORAL_TABLET | Freq: Every day | ORAL | 0 refills | Status: DC

## 2021-04-19 MED ORDER — NITROGLYCERIN 0.4 MG SL SUBL: 0.4000 mg | SUBLINGUAL_TABLET | SUBLINGUAL | 5 refills | Status: DC | PRN

## 2021-04-19 MED ORDER — IOPAMIDOL (ISOVUE-300) INJECTION 61%
100.0000 mL | Freq: Once | INTRAVENOUS | Status: AC | PRN
  Administered 2021-04-19: 100 mL via INTRAVENOUS

## 2021-04-19 NOTE — Patient Instructions (Addendum)
Medication Instructions:  Your physician has recommended you make the following change in your medication:   CHANGE your Entresto to 24-26mg  one tablet per day in the morning  *Check your blood pressure at home once per day and if your systolic blood pressure (the top number) is less than 110, contact our office and we will plan to discontinue.  *If you need a refill on your cardiac medications before your next appointment, please call your pharmacy*  Lab Work: Your physician recommends that you return for lab work in 1 week for digoxin level, BMP on 04/25/21. DO NOT take your Digoxin that morning.   If you have labs (blood work) drawn today and your tests are completely normal, you will receive your results only by: [place] (if you have MyChart) OR A paper copy in the mail If you have any lab test that is abnormal or we need to change your treatment, we will call you to review the results.   Testing/Procedures: Your echocardiogram shows your heart pumping function was 20-25%. The normal is 50-65%. Adjustments have been made to your medications to help strengthen your heart.   Loel Dubonnet, NP will talk to Dr. Stanford Breed about whether it is better to get and MRI or CT to get a better look at your aortic valve.   Follow-Up: At [facility], you and your health needs are our priority.  As part of our continuing mission to provide you with exceptional heart care, we have created designated Provider Care Teams.  These Care Teams include your primary Cardiologist (physician) and Advanced Practice Providers (APPs -  Physician Assistants and Nurse Practitioners) who all work together to provide you with the care you need, when you need it.  We recommend signing up for the patient portal called "MyChart".  Sign up information is provided on this After Visit Summary.  MyChart is used to connect with patients for Virtual Visits (Telemedicine).  Patients are able to view lab/test results,  encounter notes, upcoming appointments, etc.  Non-urgent messages can be sent to your provider as well.   To learn more about what you can do with MyChart, go to [email or web address].    Your next appointment:   1 month(s)  The format for your next appointment:   In Person  Provider:   You may see Kirk Ruths, MD or one of the following Advanced Practice Providers on your designated Care Team:   Sande Rives, PA-C Coletta Memos, [place], NP    Other Instructions  MyChart login: MyChart Username [phone]  Recommend weighing daily and keeping a log. Please call our office if you have weight gain of 2 pounds overnight or 5 pounds in 1 week.   Date  Time Weight

## 2021-04-19 NOTE — Progress Notes (Signed)
Office Visit    Patient Name: David [name]. Date of Encounter: 04/19/2021  PCP:  [name], [place]  Cardiologist:  Kirk Ruths, MD  Advanced Practice Provider:  No care team member to display Electrophysiologist:  [name], MD     Chief Complaint    David [name]. is a 85 y.o. male with a hx of CAD, hypertension, complete heart block s/p PPM, HFrEF, valvular heart disease presents today for hospital follow-up  Past Medical History    Past Medical History:  Diagnosis Date   Aortic insufficiency    Ascending aorta dilation ([place])    Balance problem    CAD (coronary artery disease)    previous stent to LAD 2003   Diabetes mellitus without complication ([place])    No longer taking diabetic medications at this time   GI bleeding    HFrEF (heart failure with reduced ejection fraction) ([place])    History of complete heart block    History of kidney stones    Hyperlipidemia    Hypertension    Iron deficiency anemia    LBBB (left bundle branch block)    Melanoma (HCC)    Myasthenia gravis ([place])    eyelids, finger, and toes   NSVT (nonsustained ventricular tachycardia) (HCC)    OSA (obstructive sleep apnea)    Does not wear CPAP.   Presence of permanent cardiac pacemaker    S/P cardiac pacemaker procedure, placement 10/24/14 Medtronic Adapta L model ADDRL 1  10/25/2014   Spinal stenosis    Severe   Tricuspid regurgitation    Past Surgical History:  Procedure Laterality Date   COLONOSCOPY     CORONARY ANGIOPLASTY WITH STENT PLACEMENT  03/01/2002   stenting to LAD - Dr. Glade Lloyd   CYSTOSCOPY/URETEROSCOPY/HOLMIUM LASER/STENT PLACEMENT Left 08/31/2019   Procedure: CYSTOSCOPY/RETROGRADE/STENT PLACEMENT;  Surgeon: Ceasar Mons, MD;  Location: [facility];  Service: Urology;  Laterality: Left;  ONY NEEDS 45 MIN   NM MYOCAR PERF WALL MOTION  11/27/2010   normal   PERMANENT PACEMAKER INSERTION N/A  10/24/2014   Procedure: PERMANENT PACEMAKER INSERTION;  Surgeon: [name], MD;  Location: [facility] CATH LAB;  Service: Cardiovascular;  Laterality: N/A;   TONSILLECTOMY     TRANSURETHRAL RESECTION OF PROSTATE N/A 07/06/2019   Procedure: TRANSURETHRAL RESECTION OF THE PROSTATE (TURP), BIPOLAR;  Surgeon: Ceasar Mons, MD;  Location: [facility];  Service: Urology;  Laterality: N/A;   US ECHOCARDIOGRAPHY  12/05/2010   proximal septal thickening,mild concentric LVH,mild deptal hypokinesis,RV mildly dilated,LA mildly dilated,mild AI,moderate aortic root dilatation,septal motion c/w conduction abnormality    Allergies  Allergies  Allergen Reactions   Azithromycin Other (See Comments)   Clindamycin Hcl Other (See Comments)   Doxycycline Diarrhea   Doxycycline Hyclate Other (See Comments)   Levofloxacin Other (See Comments)   Niacin Other (See Comments)   Gris-Peg [Griseofulvin] Other (See Comments)    headaches   Terbinafine Other (See Comments)    headache    History of Present Illness    David L Eliud Polo. is a 85 y.o. male with a hx of CAD, hypertension, complete heart block s/p PPM, HFrEF, valvular heart disease last seen while hospitalized.  Previous PCI of LAD in 2003.  Pacemaker placed in 2016 due to complete heart block.  Echocardiogram 09/2014 normal LVEF, moderate LVH, grade 1 diastolic dysfunction, mild AI, mildly dilated aortic root, moderate TR.  Nuclear study 2016 with  EF 43%, no ischemia.  Repeat echocardiogram 02/2021 performed due to lower extremity edema with LVEF 20-25%, mild LV enlargement, mild LVH, grade 1 diastolic dysfunction, mild RV enlargement, moderate LA enlargement, severe RA enlargement, mild MR, moderate TR, moderate to severe AI, dilated ascending aorta 45 mm.  He was seen in clinic by Dr. Stanford Breed 03/06/2021 and subsequently admitted for IV diuresis.  On admission hemoglobin was 9.4 which was below baseline of 13.  Started on IV  diuretics, GI consulted and started on PPI with no endoscopic procedures were pursued.  Ischemic evaluation was deferred given his age and comorbidities.  He was diuresed from 168 pounds to 144 pounds.  GDMT was titrated and ultimately discharged on Coreg, cardiac, Entresto, spironolactone, Lasix, digoxin.  Seen in TOC follow-up 03/18/2021.  He was subsequently readmitted 03/21/2021 - 03/22/2021 with symptomatic hypotension.  He was given IV fluid bolus.  Medications were adjusted and Entresto discontinued, carvedilol discontinued.  He presents today for follow up with his wife. No lightheadedness, dizziness, near syncope, syncope.  Chest pain, pressure, tightness.  No shortness of breath at rest, orthopnea, PND.  Does note that his dyspnea on exertion is improving.  He attributes his dyspnea on exertion to holding his breath when walking and tells me he has to remember to breathe.  He was using a Kareen Hitsman and then a cane at home but has felt strong enough over the last week to not use any assistive device.  He endorses diarrhea for the last two years. He did a CT scan of his abdomen today at the direction of Dr. Watt Climes and has upcoming follow-up with him. Checking blood pressure at home with systolic readings mostly 878M though over the last few days 120s-130s. Weight at discharge 139.82. Weight at home 136.  Weight in clinic today 143.  We discussed the difference between various scales.  Overall he seems stable.  He asks what happened to his heart in the hospital and we had a long discussion regarding heart failure, the physiology, morbidity, mortality, management.  EKGs/Labs/Other Studies Reviewed:   The following studies were reviewed today:   2D echo 02/26/21  1. Left ventricular ejection fraction, by estimation, is 20 to 25%. The  left ventricle has severely decreased function. The left ventricle  demonstrates global hypokinesis. The left ventricular internal cavity size  was mildly dilated. There is  mild left  ventricular hypertrophy. Left ventricular diastolic parameters are  consistent with Grade I diastolic dysfunction (impaired relaxation).   2. Right ventricular systolic function is severely reduced. The right  ventricular size is mildly enlarged.   3. Left atrial size was moderately dilated.   4. Pacer wire present. Right atrial size was severely dilated.   5. The mitral valve is normal in structure. Mild mitral valve  regurgitation. No evidence of mitral stenosis.   6. Tricuspid valve regurgitation is moderate.   7. The aortic valve is tricuspid. Aortic valve regurgitation is moderate  to severe. No aortic stenosis is present.   8. Aortic dilatation noted. There is moderate dilatation of the ascending  aorta, measuring 45 mm.   9. The inferior vena cava is dilated in size with <50% respiratory  variability, suggesting right atrial pressure of 15 mmHg.   Comparison(s): Changes from prior study are noted. There is severe  biventricular dysfunction and moderate to severe aoritc valve insuffiency  with dialted ascending aorta since last study.   EKG: No EKG today  Recent Labs: 11/20/2020: TSH 1.780 03/06/2021: B Natriuretic Peptide  1,466.8 03/21/2021: ALT 23 03/22/2021: BUN 10; Creatinine, Ser 0.54; Hemoglobin 10.4; Platelets 208; Potassium 3.9; Sodium 133  Recent Lipid Panel No results found for: CHOL, TRIG, HDL, CHOLHDL, VLDL, LDLCALC, LDLDIRECT   Home Medications   Current Meds  Medication Sig   aspirin EC 81 MG tablet Take 81 mg by mouth daily.    desmopressin (DDAVP) 0.1 MG tablet TAKE 1 TABLET(0.1 MG) BY MOUTH AT BEDTIME (Patient taking differently: Take 0.1 mg by mouth at bedtime.)   digoxin (LANOXIN) 0.125 MG tablet Take 1 tablet (0.125 mg total) by mouth daily.   empagliflozin (JARDIANCE) 10 MG TABS tablet Take 1 tablet (10 mg total) by mouth daily.   Ensure (ENSURE) Take 237 mLs by mouth daily as needed (feeding suppliment).   ezetimibe-simvastatin (VYTORIN)  10-40 MG tablet Take 1 tablet by mouth daily.   ferrous sulfate 325 (65 FE) MG tablet Take 1 tablet (325 mg total) by mouth 2 (two) times daily with a meal.   furosemide (LASIX) 20 MG tablet Take 1 tablet (20 mg total) by mouth as needed for edema (for weight gain of 2-3lb and ankle edema).   loratadine (CLARITIN) 10 MG tablet Take 10 mg by mouth daily.   mycophenolate (CELLCEPT) 250 MG capsule TAKE 2 CAPSULES BY MOUTH TWICE DAILY (Patient taking differently: Take 500 mg by mouth 2 (two) times daily.)   MYRBETRIQ 50 MG TB24 tablet Take 50 mg by mouth daily.   nitroGLYCERIN (NITROSTAT) 0.4 MG SL tablet Place 1 tablet (0.4 mg total) under the tongue every 5 (five) minutes as needed for chest pain. Reported on 11/01/2015   pantoprazole (PROTONIX) 40 MG tablet 40mg  twice daily until 04/11/21, then reduce to 40mg  daily thereafter (Patient taking differently: Take 40 mg by mouth 2 (two) times daily. 40mg  twice daily until 04/11/21, then reduce to 40mg  daily thereafter)   Polyethyl Glycol-Propyl Glycol (SYSTANE) 0.4-0.3 % SOLN Place 1 drop into both eyes daily as needed (dry eyes).   pyridostigmine (MESTINON) 60 MG tablet Take 2 tablets by mouth four times daily (Patient taking differently: Take 120 mg by mouth every 4 (four) hours while awake.)   saccharomyces boulardii (FLORASTOR) 250 MG capsule Take 250 mg by mouth 2 (two) times daily.   spironolactone (ALDACTONE) 25 MG tablet Take 1 tablet (25 mg total) by mouth daily.   tamsulosin (FLOMAX) 0.4 MG CAPS capsule Take 1 capsule (0.4 mg total) by mouth daily.   traZODone (DESYREL) 50 MG tablet Take 0.5-1 tablets (25-50 mg total) by mouth at bedtime as needed for sleep.   Wheat Dextrin (BENEFIBER) POWD Take 2 Scoops by mouth every other day.     Review of Systems      All other systems reviewed and are otherwise negative except as noted above.  Physical Exam    VS:  BP 140/76   Pulse 78   Ht 5\' 9"  (1.753 m)   Wt 143 lb 1.6 oz (64.9 kg)   SpO2 92%    BMI 21.13 kg/m  , BMI Body mass index is 21.13 kg/m.  Wt Readings from Last 3 Encounters:  04/19/21 143 lb 1.6 oz (64.9 kg)  03/22/21 140 lb 4.8 oz (63.6 kg)  03/18/21 142 lb 3.2 oz (64.5 kg)     GEN: Well nourished, frail-appearing, well developed, in no acute distress. HEENT: normal. Neck: Supple, no JVD, carotid bruits, or masses. Cardiac: RRR, no murmurs, rubs, or gallops. No clubbing, cyanosis, edema.  Radials/PT 2+ and equal bilaterally.  Respiratory:  Respirations  regular and unlabored, clear to auscultation bilaterally. GI: Soft, nontender, nondistended. MS: No deformity or atrophy. Skin: Warm and dry, no rash. Neuro:  Strength and sensation are intact. Psych: Normal affect.  Assessment & Plan    HFrEF /cardiomyopathy /valvular heart disease -LVEF 20-25%, moderate to severe AI, dilated aortic root.  Potential etiology AI.  Pending long-term goal CRT could be considered. Euvolemic and well compensated on exam. NYHA III.  GDMT includes Digoxin, Spironolactone, Jardiance, Lasix PRN.  Careful titration of GDMT given previous admission with hypotension.  Will defer beta-blocker until last line agent.  Start Entresto 24-26 mg daily.  He will contact our office for systolic blood pressure less then 110 at which time we would need to discontinue.  Plan for repeat BMP in 1 week.  We will include digoxin level at that time he was educated to hold his digoxin that morning.  No evidence of toxin toxicity.  If BMP and blood pressure are stable at that time plan to increase to [facility] twice daily. Will discuss with Dr. Stanford Breed whether would prefer proceeding with CT versus cardiac MRI for further evaluation of aorta, LVEF, aortic valve.  Plan for repeat echocardiogram 3 months from optimize GDMT to reassess LVEF.  Dilated aortic root -Echo 02/26/2021 dilated aortic root 45 mm.  Will discuss with Dr. Stanford Breed whether would prefer proceeding with CT versus cardiac MRI for further evaluation of  aorta, LVEF, aortic valve.  CAD s/p remote PCI to LAS 2003 -denies chest pain, pressure, tightness.  Newly reduced LVEF given multiple comorbidities plan is to optimize medications, repeat echo in approximately 3 months, and if LVEF still reduced consider further ischemic evaluation.  GDMT includes aspirin, simvastatin-ezetimibe. No BB due to previous hypotension  HTN -recent admission with orthostatic hypotension. Now resolved. Careful titration of HF GDMT as detailed above.  HLD - 11/21/2020 LDL 52.  At goal of less than 70.Continue simvastatin-Zetia 40-10 mg daily.  IDA - Continue to follow with GI. CT abd/pelvis performed today. Blood work including CBC performed earlier this week by PCP but not yet available in [place]. Encouraged dietary sources of iron.   DM2 - Continue to follow with PCP.   CHB s/p PPM -follows with Dr. Rayann Heman.   Disposition: Follow up in 1 month(s) with Dr. Stanford Breed or APP.  Signed, Loel Dubonnet, NP 04/19/2021, 3:57 PM [place]

## 2021-04-25 DIAGNOSIS — I5022 Chronic systolic (congestive) heart failure: Secondary | ICD-10-CM | POA: Diagnosis not present

## 2021-04-25 DIAGNOSIS — Z79899 Other long term (current) drug therapy: Secondary | ICD-10-CM | POA: Diagnosis not present

## 2021-04-26 LAB — DIGOXIN LEVEL: Digoxin, Serum: 0.5 ng/mL (ref 0.5–0.9)

## 2021-04-26 LAB — BASIC METABOLIC PANEL
BUN/Creatinine Ratio: 18 (ref 10–24)
BUN: 10 mg/dL (ref 8–27)
CO2: 27 mmol/L (ref 20–29)
Calcium: 8.9 mg/dL (ref 8.6–10.2)
Chloride: 95 mmol/L — ABNORMAL LOW (ref 96–106)
Creatinine, Ser: 0.56 mg/dL — ABNORMAL LOW (ref 0.76–1.27)
Glucose: 80 mg/dL (ref 70–99)
Potassium: 4.2 mmol/L (ref 3.5–5.2)
Sodium: 134 mmol/L (ref 134–144)
eGFR: 98 mL/min/{1.73_m2} (ref >59)

## 2021-04-30 NOTE — Progress Notes (Addendum)
HPI: Follow-up coronary artery disease, CHF, hypertension and history of complete heart block status post pacemaker. Patient had PCI of his LAD in 2003.  He had a pacemaker placed in 2016.  Echocardiogram March 2016 showed normal LV function, moderate left ventricular hypertrophy, grade 1 diastolic dysfunction, mild aortic insufficiency, mildly dilated aortic root, moderate tricuspid regurgitation.  Nuclear study September 2016 showed ejection fraction 43%, inferior thinning but no ischemia.  Echocardiogram repeated in August 2022 and showed ejection fraction 20 to 25%, mild left ventricular enlargement, mild left ventricular hypertrophy, grade 1 diastolic dysfunction, mild right ventricular enlargement, moderate left atrial enlargement, severe right atrial enlargement, mild mitral regurgitation, moderate tricuspid regurgitation, moderate to severe aortic insufficiency, dilated ascending aorta measuring 45 mm.  At last office visit patient was volume overloaded and we admitted for IV diuresis and medication adjustment.  Patient was noted to be anemic; placed on PPI and GI recommended no further evaluation.  Plan initially was for cardiac catheterization given reduced LV function but given age and comorbidities medical therapy was pursued.  Following discharge he was readmitted with low blood pressure and Entresto was discontinued.  At follow-up office visit Delene Loll was resumed at once daily dose.  Since last seen patient denies dyspnea on exertion, orthopnea, PND, pedal edema, chest pain or syncope.  Occasional weakness.  Current Outpatient Medications  Medication Sig Dispense Refill   aspirin EC 81 MG tablet Take 81 mg by mouth daily.      carvedilol (COREG) 3.125 MG tablet Take 1 tablet (3.125 mg total) by mouth 2 (two) times daily. 180 tablet 3   desmopressin (DDAVP) 0.1 MG tablet Take 1 tablet (0.1 mg total) by mouth at bedtime. Please keep upcoming appt for further refills 30 tablet 2    digoxin (LANOXIN) 0.125 MG tablet Take 1 tablet (0.125 mg total) by mouth daily. 60 tablet 2   empagliflozin (JARDIANCE) 10 MG TABS tablet Take 1 tablet (10 mg total) by mouth daily. 60 tablet 2   Ensure (ENSURE) Take 237 mLs by mouth daily as needed (feeding suppliment).     ezetimibe-simvastatin (VYTORIN) 10-40 MG tablet Take 1 tablet by mouth daily.     ferrous sulfate 325 (65 FE) MG tablet Take 1 tablet (325 mg total) by mouth 2 (two) times daily with a meal. 120 tablet 3   furosemide (LASIX) 20 MG tablet Take 1 tablet (20 mg total) by mouth as needed for edema (for weight gain of 2-3lb and ankle edema). 30 tablet 11   loratadine (CLARITIN) 10 MG tablet Take 10 mg by mouth daily.     mycophenolate (CELLCEPT) 250 MG capsule TAKE 2 CAPSULES BY MOUTH TWICE DAILY (Patient taking differently: Take 500 mg by mouth 2 (two) times daily.) 120 capsule 5   MYRBETRIQ 50 MG TB24 tablet Take 50 mg by mouth daily.     nitroGLYCERIN (NITROSTAT) 0.4 MG SL tablet Place 1 tablet (0.4 mg total) under the tongue every 5 (five) minutes as needed for chest pain. 25 tablet 5   pantoprazole (PROTONIX) 40 MG tablet 40mg  twice daily until 04/11/21, then reduce to 40mg  daily thereafter (Patient taking differently: Take 40 mg by mouth 2 (two) times daily. 40mg  twice daily until 04/11/21, then reduce to 40mg  daily thereafter) 120 tablet 2   Polyethyl Glycol-Propyl Glycol (SYSTANE) 0.4-0.3 % SOLN Place 1 drop into both eyes daily as needed (dry eyes).     pyridostigmine (MESTINON) 60 MG tablet Take 2 tablets by mouth four times  daily (Patient taking differently: Take 120 mg by mouth every 4 (four) hours while awake.) 720 tablet 3   saccharomyces boulardii (FLORASTOR) 250 MG capsule Take 250 mg by mouth 2 (two) times daily.     sacubitril-valsartan (ENTRESTO) 24-26 MG Take 1 tablet by mouth daily. Take one tablet daily for one week. If your systolic blood pressure (the top number) is consistently more than 110 after one week  increase to twice daily. 30 tablet 0   spironolactone (ALDACTONE) 25 MG tablet Take 1 tablet (25 mg total) by mouth daily. 60 tablet 2   tamsulosin (FLOMAX) 0.4 MG CAPS capsule Take 1 capsule (0.4 mg total) by mouth daily. 60 capsule 2   traZODone (DESYREL) 50 MG tablet Take 0.5-1 tablets (25-50 mg total) by mouth at bedtime as needed for sleep. 30 tablet 5   Wheat Dextrin (BENEFIBER) POWD Take 2 Scoops by mouth every other day.     No current facility-administered medications for this visit.     Past Medical History:  Diagnosis Date   Aortic insufficiency    Ascending aorta dilation (HCC)    Balance problem    CAD (coronary artery disease)    previous stent to LAD 2003   Diabetes mellitus without complication (HCC)    No longer taking diabetic medications at this time   GI bleeding    HFrEF (heart failure with reduced ejection fraction) ([place])    History of complete heart block    History of kidney stones    Hyperlipidemia    Hypertension    Iron deficiency anemia    LBBB (left bundle branch block)    Melanoma (HCC)    Myasthenia gravis (HCC)    eyelids, finger, and toes   NSVT (nonsustained ventricular tachycardia)    OSA (obstructive sleep apnea)    Does not wear CPAP.   Presence of permanent cardiac pacemaker    S/P cardiac pacemaker procedure, placement 10/24/14 Medtronic Adapta L model ADDRL 1  10/25/2014   Spinal stenosis    Severe   Tricuspid regurgitation     Past Surgical History:  Procedure Laterality Date   COLONOSCOPY     CORONARY ANGIOPLASTY WITH STENT PLACEMENT  03/01/2002   stenting to LAD - Dr. Glade Lloyd   CYSTOSCOPY/URETEROSCOPY/HOLMIUM LASER/STENT PLACEMENT Left 08/31/2019   Procedure: CYSTOSCOPY/RETROGRADE/STENT PLACEMENT;  Surgeon: Ceasar Mons, MD;  Location: [facility];  Service: Urology;  Laterality: Left;  ONY NEEDS 45 MIN   NM MYOCAR PERF WALL MOTION  11/27/2010   normal   PERMANENT PACEMAKER INSERTION N/A 10/24/2014    Procedure: PERMANENT PACEMAKER INSERTION;  Surgeon: [name], MD;  Location: [facility] CATH LAB;  Service: Cardiovascular;  Laterality: N/A;   TONSILLECTOMY     TRANSURETHRAL RESECTION OF PROSTATE N/A 07/06/2019   Procedure: TRANSURETHRAL RESECTION OF THE PROSTATE (TURP), BIPOLAR;  Surgeon: Ceasar Mons, MD;  Location: [facility];  Service: Urology;  Laterality: N/A;   US ECHOCARDIOGRAPHY  12/05/2010   proximal septal thickening,mild concentric LVH,mild deptal hypokinesis,RV mildly dilated,LA mildly dilated,mild AI,moderate aortic root dilatation,septal motion c/w conduction abnormality    Social History   Socioeconomic History   Marital status: Married    Spouse name: Not on file   Number of children: Not on file   Years of education: Not on file   Highest education level: Not on file  Occupational History   Not on file  Tobacco Use   Smoking status: Former    Types: Cigarettes, Cigars  Start date: 04/28/1949    Quit date: 10/27/1982    Years since quitting: 38.5   Smokeless tobacco: Never  Vaping Use   Vaping Use: Never used  Substance and Sexual Activity   Alcohol use: Not Currently    Comment: 1-2 drink per week   Drug use: No   Sexual activity: Not on file  Other Topics Concern   Not on file  Social History Narrative   Not on file   Social Determinants of Health   Financial Resource Strain: Not on file  Food Insecurity: Not on file  Transportation Needs: Not on file  Physical Activity: Not on file  Stress: Not on file  Social Connections: Not on file  Intimate Partner Violence: Not on file    Family History  Problem Relation Age of Onset   Breast cancer Mother    Colon cancer Father    Lung cancer Father     ROS: no fevers or chills, productive cough, hemoptysis, dysphasia, odynophagia, melena, hematochezia, dysuria, hematuria, rash, seizure activity, orthopnea, PND, pedal edema, claudication. Remaining systems are  negative.  Physical Exam: Well-developed frail in no acute distress.  Skin is warm and dry.  HEENT is normal.  Neck is supple.  Chest is clear to auscultation with normal expansion.  Cardiovascular exam is regular rate and rhythm.  Abdominal exam nontender or distended. No masses palpated. Extremities show no edema. neuro grossly intact  A/P  1 chronic systolic congestive heart failure-his volume status is much improved following recent admission for diuresis.  Continue Lasix, spironolactone and Jardiance at present dose.   2 cardiomyopathy-etiology unclear at this point.  Question coronary artery disease; question dyssynchrony related to left bundle branch block; question aortic insufficiency.  Continue Entresto and digoxin.  Add carvedilol 3.125 mg twice daily.  Titrate as tolerated.  I had previously considered cardiac catheterization to further assess etiology of his cardiomyopathy.  However he is somewhat frail and has multiple medical problems.  We will plan medical therapy at this point.  I will likely repeat his echocardiogram in 3 to 6 months to see if his LV function has improved.  3 dilated aortic root/AI-I do not think patient is a good candidate for consideration of aortic root or aortic valve replacement given his age and comorbidities.  I discussed this with he and his wife today and they are in agreement.  We will plan conservative measures unless he makes dramatic improvement in his overall medical condition.  4 coronary artery disease-continue aspirin and statin.  He denies chest pain.  5 hypertension-continue present medications.  6 hyperlipidemia-continue Vytorin.  7 previous pacemaker-Per Dr. Rayann Heman.  8 anemia-follow-up gastroenterology.  Check hemoglobin and anemia panel.   Kirk Ruths, MD

## 2021-05-07 ENCOUNTER — Other Ambulatory Visit: Payer: Self-pay | Admitting: Neurology

## 2021-05-08 ENCOUNTER — Other Ambulatory Visit: Payer: Self-pay

## 2021-05-08 ENCOUNTER — Ambulatory Visit: Payer: Medicare Other | Admitting: Cardiology

## 2021-05-08 ENCOUNTER — Encounter: Payer: Self-pay | Admitting: Cardiology

## 2021-05-08 VITALS — BP 106/64 | HR 60 | Ht 69.0 in | Wt 144.4 lb

## 2021-05-08 DIAGNOSIS — I5022 Chronic systolic (congestive) heart failure: Secondary | ICD-10-CM

## 2021-05-08 DIAGNOSIS — Z950 Presence of cardiac pacemaker: Secondary | ICD-10-CM | POA: Diagnosis not present

## 2021-05-08 DIAGNOSIS — I7121 Aneurysm of the ascending aorta, without rupture: Secondary | ICD-10-CM

## 2021-05-08 DIAGNOSIS — I25118 Atherosclerotic heart disease of native coronary artery with other forms of angina pectoris: Secondary | ICD-10-CM

## 2021-05-08 DIAGNOSIS — D500 Iron deficiency anemia secondary to blood loss (chronic): Secondary | ICD-10-CM | POA: Diagnosis not present

## 2021-05-08 DIAGNOSIS — I351 Nonrheumatic aortic (valve) insufficiency: Secondary | ICD-10-CM | POA: Diagnosis not present

## 2021-05-08 MED ORDER — CARVEDILOL 3.125 MG PO TABS: 3.1250 mg | ORAL_TABLET | Freq: Two times a day (BID) | ORAL | 3 refills | Status: DC

## 2021-05-08 NOTE — Patient Instructions (Signed)
Medication Instructions:  START CARVEDILOL 3.125 MG TWICE A DAY *If you need a refill on your cardiac medications before your next appointment, please call your pharmacy*   Follow-Up: At [facility], you and your health needs are our priority.  As part of our continuing mission to provide you with exceptional heart care, we have created designated Provider Care Teams.  These Care Teams include your primary Cardiologist (physician) and Advanced Practice Providers (APPs -  Physician Assistants and Nurse Practitioners) who all work together to provide you with the care you need, when you need it.  We recommend signing up for the patient portal called "MyChart".  Sign up information is provided on this After Visit Summary.  MyChart is used to connect with patients for Virtual Visits (Telemedicine).  Patients are able to view lab/test results, encounter notes, upcoming appointments, etc.  Non-urgent messages can be sent to your provider as well.   To learn more about what you can do with MyChart, go to [email or web address].    Your next appointment:   3 month(s)  The format for your next appointment:   In Person  Provider:   Kirk Ruths, MD

## 2021-05-08 NOTE — Addendum Note (Signed)
Addended by: Betha Loa F on: 05/08/2021 02:31 PM   Modules accepted: Orders

## 2021-05-08 NOTE — Addendum Note (Signed)
Addended by: Betha Loa F on: 05/08/2021 02:28 PM   Modules accepted: Orders

## 2021-05-09 LAB — BASIC METABOLIC PANEL
BUN/Creatinine Ratio: 18 (ref 10–24)
BUN: 13 mg/dL (ref 8–27)
CO2: 24 mmol/L (ref 20–29)
Calcium: 8.7 mg/dL (ref 8.6–10.2)
Chloride: 94 mmol/L — ABNORMAL LOW (ref 96–106)
Creatinine, Ser: 0.73 mg/dL — ABNORMAL LOW (ref 0.76–1.27)
Glucose: 87 mg/dL (ref 70–99)
Potassium: 4.4 mmol/L (ref 3.5–5.2)
Sodium: 132 mmol/L — ABNORMAL LOW (ref 134–144)
eGFR: 90 mL/min/{1.73_m2} (ref >59)

## 2021-05-13 ENCOUNTER — Telehealth: Payer: Self-pay | Admitting: Neurology

## 2021-05-13 LAB — ANEMIA PANEL
Ferritin: 108 ng/mL (ref 30–400)
Hematocrit: 42 % (ref 37.5–51.0)
Iron Saturation: 34 % (ref 15–55)
Iron: 92 ug/dL (ref 38–169)
Retic Ct Pct: 1.4 % (ref 0.6–2.6)
Total Iron Binding Capacity: 268 ug/dL (ref 250–450)
UIBC: 176 ug/dL (ref 111–343)
Vitamin B-12: 411 pg/mL (ref 232–1245)

## 2021-05-13 LAB — CBC
Hemoglobin: 13.9 g/dL (ref 13.0–17.7)
MCH: 29.6 pg (ref 26.6–33.0)
MCHC: 33.1 g/dL (ref 31.5–35.7)
MCV: 90 fL (ref 79–97)
Platelets: 223 10*3/uL (ref 150–450)
RBC: 4.69 x10E6/uL (ref 4.14–5.80)
RDW: 14.9 % (ref 11.6–15.4)
WBC: 5.9 10*3/uL (ref 3.4–10.8)

## 2021-05-13 NOTE — Telephone Encounter (Signed)
Pt called asking if the new medication carvedilol (COREG) 3.125 MG tablet will hurt any of the medications he's already on.

## 2021-05-13 NOTE — Telephone Encounter (Signed)
Called the patient back and advised that according to the medications that Dr. Felecia Shelling prescribed for the patient during no drug interactions listed with carvedilol.  Advised he should verify with the pharmacist in regards to other medications that he takes.  Patient states that his DDAVP medication was refused according to the pharmacy.  Advised that [place] and it appears that we have sent a refill on 05/07/2021 for 30-day supply with 2 refills.  Patient verbalized understanding and was appreciative for the call back.

## 2021-05-14 ENCOUNTER — Telehealth: Payer: Self-pay | Admitting: Cardiology

## 2021-05-14 MED ORDER — LOSARTAN POTASSIUM 25 MG PO TABS: 25.0000 mg | ORAL_TABLET | Freq: Every day | ORAL | 3 refills | Status: DC

## 2021-05-14 MED ORDER — SPIRONOLACTONE 25 MG PO TABS: 25.0000 mg | ORAL_TABLET | Freq: Every day | ORAL | 3 refills | Status: DC

## 2021-05-14 NOTE — Telephone Encounter (Signed)
The patient's wife has been made aware of Dr. Jacalyn Lefevre instructions.   Discontinue Entresto.  Begin losartan 25 mg daily.  Follow blood pressure.  Kirk Ruths   In addition, per Dr. Stanford Breed, the patient will start the Losartan on Thursday and will not take for a systolic under 048.

## 2021-05-14 NOTE — Telephone Encounter (Signed)
Spoke with the patient and his wife. They stated that his blood pressures have gradually been declining since Friday.   Friday it was 119/65 (before medications) Saturday it was 111/63 before medications. By the afternoon it was 60/35 Sunday it was 60/35 after medications.  He currently takes: Carvedilol 3.125 mg bid Digoxin 0.125 Spironolactone 25 mg daily Entresto 24-26 mg bid  The patient has been having blurry vision and dizziness. He was currently asymptomatic. A few hours ago his blood pressure was 59/37. While on the phone it had increased to 72/45.   The patient declined going to an urgent care to be evaluated stating that he felt fine. He has been advised to eat a salty snack, rest and that we would call him back with recommendations.

## 2021-05-14 NOTE — Telephone Encounter (Signed)
Patient's wife calling back. She wants to make sure she gets a call back today.

## 2021-05-14 NOTE — Telephone Encounter (Signed)
Pt c/o BP issue: STAT if pt c/o blurred vision, one-sided weakness or slurred speech  1. What are your last 5 BP readings?  59/37 57/33 101/57  2. Are you having any other symptoms (ex. Dizziness, headache, blurred vision, passed out)? Dizzy, doesn't feel well.   3. What is your BP issue? Low BP

## 2021-05-16 ENCOUNTER — Other Ambulatory Visit: Payer: Self-pay

## 2021-05-16 ENCOUNTER — Ambulatory Visit (INDEPENDENT_AMBULATORY_CARE_PROVIDER_SITE_OTHER)
Admission: RE | Admit: 2021-05-16 | Discharge: 2021-05-16 | Disposition: A | Discharge status: Home / Self Care | Payer: Medicare Other | Source: Ambulatory Visit | Attending: Cardiology | Admitting: Cardiology

## 2021-05-16 DIAGNOSIS — I7121 Aneurysm of the ascending aorta, without rupture: Secondary | ICD-10-CM | POA: Diagnosis not present

## 2021-05-16 DIAGNOSIS — I7122 Aneurysm of the aortic arch, without rupture: Secondary | ICD-10-CM | POA: Diagnosis not present

## 2021-05-16 DIAGNOSIS — I712 Thoracic aortic aneurysm, without rupture, unspecified: Secondary | ICD-10-CM | POA: Diagnosis not present

## 2021-05-16 DIAGNOSIS — N281 Cyst of kidney, acquired: Secondary | ICD-10-CM | POA: Diagnosis not present

## 2021-05-16 DIAGNOSIS — I517 Cardiomegaly: Secondary | ICD-10-CM | POA: Diagnosis not present

## 2021-05-16 MED ORDER — IOHEXOL 350 MG/ML SOLN
100.0000 mL | Freq: Once | INTRAVENOUS | Status: AC | PRN
  Administered 2021-05-16: 100 mL via INTRAVENOUS

## 2021-05-20 ENCOUNTER — Telehealth: Payer: Self-pay | Admitting: Cardiology

## 2021-05-20 MED ORDER — PANTOPRAZOLE SODIUM 40 MG PO TBEC: 40.0000 mg | DELAYED_RELEASE_TABLET | Freq: Every day | ORAL | 2 refills | Status: DC

## 2021-05-20 MED ORDER — FERROUS SULFATE 325 (65 FE) MG PO TABS
325.0000 mg | ORAL_TABLET | Freq: Two times a day (BID) | ORAL | 2 refills | Status: DC
Start: 2021-05-20 — End: 2022-02-19

## 2021-05-20 MED ORDER — DIGOXIN 125 MCG PO TABS: 0.1250 mg | ORAL_TABLET | Freq: Every day | ORAL | 2 refills | Status: DC

## 2021-05-20 MED ORDER — EMPAGLIFLOZIN 10 MG PO TABS: 10.0000 mg | ORAL_TABLET | Freq: Every day | ORAL | 2 refills | Status: DC

## 2021-05-20 NOTE — Telephone Encounter (Signed)
Called patient back- gave results.  They asked to reset the mychart account-  I have tried to do this, they will let us know of any issues.    Will route to RN as Juluis Rainier. Thanks!

## 2021-05-20 NOTE — Telephone Encounter (Signed)
*  STAT* If patient is at the pharmacy, call can be transferred to refill team.   1. Which medications need to be refilled? (please list name of each medication and dose if known)  empagliflozin (JARDIANCE) 10 MG TABS tablet digoxin (LANOXIN) 0.125 MG tablet ferrous sulfate 325 (65 FE) MG tablet pantoprazole (PROTONIX) 40 MG tablet   2. Which pharmacy/location (including street and city if local pharmacy) is medication to be sent to?[facility], [place] - [address] AT [place]  3. Do they need a 30 day or 90 day supply? 90 day Out of meds

## 2021-05-20 NOTE — Telephone Encounter (Signed)
Patient's wife calling for test results.

## 2021-05-20 NOTE — Telephone Encounter (Signed)
Refills has been sent to the pharmacy. 

## 2021-05-21 ENCOUNTER — Telehealth: Payer: Self-pay | Admitting: *Deleted

## 2021-05-21 DIAGNOSIS — C44629 Squamous cell carcinoma of skin of left upper limb, including shoulder: Secondary | ICD-10-CM | POA: Diagnosis not present

## 2021-05-21 DIAGNOSIS — Z85828 Personal history of other malignant neoplasm of skin: Secondary | ICD-10-CM | POA: Diagnosis not present

## 2021-05-21 DIAGNOSIS — D485 Neoplasm of uncertain behavior of skin: Secondary | ICD-10-CM | POA: Diagnosis not present

## 2021-05-21 NOTE — Telephone Encounter (Signed)
PA Mycophenolate Mofetil 250MG  capsules submitted on CMM. Key:  Key: [id number] - Rx #: [id number].  Waiting on determination from [place] Medicare Part D.

## 2021-05-21 NOTE — Telephone Encounter (Signed)
PA approved effective from 05/21/2021 through 05/21/2022.

## 2021-05-22 ENCOUNTER — Ambulatory Visit (HOSPITAL_BASED_OUTPATIENT_CLINIC_OR_DEPARTMENT_OTHER): Payer: Medicare Other | Admitting: Family

## 2021-05-29 ENCOUNTER — Encounter: Payer: Self-pay | Admitting: Neurology

## 2021-05-29 ENCOUNTER — Ambulatory Visit: Payer: Medicare Other | Admitting: Neurology

## 2021-05-29 VITALS — BP 109/53 | HR 61 | Ht 69.0 in | Wt 145.0 lb

## 2021-05-29 DIAGNOSIS — Z79899 Other long term (current) drug therapy: Secondary | ICD-10-CM | POA: Diagnosis not present

## 2021-05-29 DIAGNOSIS — R350 Frequency of micturition: Secondary | ICD-10-CM | POA: Diagnosis not present

## 2021-05-29 DIAGNOSIS — R269 Unspecified abnormalities of gait and mobility: Secondary | ICD-10-CM

## 2021-05-29 DIAGNOSIS — G7000 Myasthenia gravis without (acute) exacerbation: Secondary | ICD-10-CM

## 2021-05-29 DIAGNOSIS — H532 Diplopia: Secondary | ICD-10-CM

## 2021-05-29 DIAGNOSIS — G4733 Obstructive sleep apnea (adult) (pediatric): Secondary | ICD-10-CM

## 2021-05-29 NOTE — Progress Notes (Signed)
[facility]  PATIENT: David [name]. DOB: [birth year]  REFERRING DOCTOR OR PCP:  [name] (PCP) and Rutherford Guys (Ophtho) SOURCE: Patient, records from Dr. Shelia Media, Lab results, CPAP download  _________________________________   HISTORICAL  CHIEF COMPLAINT:  Chief Complaint  Patient presents with   Follow-up    RM 1 w wife. Last seen 11/20/20. Here for 6 month f/u. Pt has been in the hospital 2x in Aug. for heart problems.     HISTORY OF PRESENT ILLNESS:  David [name]  is a 85 y.o. man with myasthenia gravis, and OSA.     Update 05/29/2021: The myasthenia gravis is doing about the same.   He has occasional diplopia but no ptosis.   He notes diplopia the most towards the end of a pyridostigmine dose.   Due to ptosis he had blepharoplasty in 2021 with benefit. Marland Kitchen   He has mild proximal weakness but can stand up without using his arms. He is on Cellcept 500 mg po bid and Mestinon up to 8 pills (60 mg each) daily.     He does have some diarrhea, probably from the pyridostigmine, and takes imodium.  Unfortunately, reducing the pyridostigmine increase the myasthenia gravis symptoms.  He was having  nocturia  4-6 times nightly and is much better on desmopressin.  He is also on Myrbetriq and tamsulosin.  Due to cognitive changes, I would prefer to stay away from the anticholinergic agents.  He has a pacemaker.  Although he has nitroglycerin tablets, he denies any angina and has never taken any of those tablets..     He takes trazodone for insomnia and tolerates it well.  The desmopressin has also helped his sleep by reducing the nocturia.  It has helped.  He has nocturia but falls asleep easily after using the toilet.   He lost 75 pounds in last 2 years but has regained 10 pounds this year.  He no longer snores and stopped CPAP.      His back and leg pain improved with his weight loss.  .  He has severe spinal stenosis at L3-L4 and also has a pars defect with  anterolisthesis at L5-S1.  He has not needed ESIs recently.  He has had a couple non-melanoma skin cancers.        MG History:   He had the onset of ptosis, while traveling in [place] and [place], about 3 weeks ago. He first noted the ptosis while trying to watch a movie in the bus (screen was up high) and noting that the eyelids were covering his vision.  He did note the trip was stressful and he and his wife both had injuries (he sprained ankle and wife broke shoulder).   Lab work was performed and the acetylcholine binding antibodies were elevated at 1.85 (less than 0.24 normal). 57% were blocking antibodies and 25% or modulating antibodies.   Other lab work was noncontributory.     CT scan of the head showed age-related mild chronic microvascular ischemic change.      REVIEW OF SYSTEMS: Constitutional: No fevers, chills, sweats, or change in appetite Eyes: No visual changes, double vision, eye pain.   He notes some ptosis which reduces his visual fields superiorly Ear, nose and throat: No hearing loss, ear pain, nasal congestion, sore throat Cardiovascular: No chest pain, palpitations Respiratory:  No shortness of breath at rest or with exertion.   No wheezes.  He has OSA and is having trouble using CPAP. GastrointestinaI:  No nausea, vomiting, diarrhea, abdominal pain, fecal incontinence Genitourinary:  No dysuria, urinary retention or frequency.  No nocturia. Musculoskeletal:  No neck pain, back pain Integumentary: No rashes. Neurological: as above Psychiatric: No depression at this time.  No anxiety Endocrine: No palpitations, diaphoresis, change in appetite, change in weigh or increased thirst Hematologic/Lymphatic:  No anemia, purpura, petechiae. Allergic/Immunologic: No itchy/runny eyes, nasal congestion, recent allergic reactions, rashes  ALLERGIES: Allergies  Allergen Reactions   Azithromycin Other (See Comments)   Clindamycin Hcl Other (See Comments)   Doxycycline Diarrhea    Doxycycline Hyclate Other (See Comments)   Levofloxacin Other (See Comments)   Niacin Other (See Comments)   Gris-Peg [Griseofulvin] Other (See Comments)    headaches   Terbinafine Other (See Comments)    headache    HOME MEDICATIONS:  Current Outpatient Medications:    aspirin EC 81 MG tablet, Take 81 mg by mouth daily. , Disp: , Rfl:    carvedilol (COREG) 3.125 MG tablet, Take 1 tablet (3.125 mg total) by mouth 2 (two) times daily., Disp: 180 tablet, Rfl: 3   Cholecalciferol (VITAMIN D3) 50 MCG (2000 UT) CAPS, Take 1 capsule by mouth daily., Disp: , Rfl:    desmopressin (DDAVP) 0.1 MG tablet, Take 1 tablet (0.1 mg total) by mouth at bedtime. Please keep upcoming appt for further refills, Disp: 30 tablet, Rfl: 2   digoxin (LANOXIN) 0.125 MG tablet, Take 1 tablet (0.125 mg total) by mouth daily., Disp: 90 tablet, Rfl: 2   empagliflozin (JARDIANCE) 10 MG TABS tablet, Take 1 tablet (10 mg total) by mouth daily., Disp: 90 tablet, Rfl: 2   Ensure (ENSURE), Take 237 mLs by mouth daily as needed (feeding suppliment)., Disp: , Rfl:    ezetimibe-simvastatin (VYTORIN) 10-40 MG tablet, Take 1 tablet by mouth daily., Disp: , Rfl:    ferrous sulfate 325 (65 FE) MG tablet, Take 1 tablet (325 mg total) by mouth 2 (two) times daily with a meal., Disp: 180 tablet, Rfl: 2   furosemide (LASIX) 20 MG tablet, Take 1 tablet (20 mg total) by mouth as needed for edema (for weight gain of 2-3lb and ankle edema)., Disp: 30 tablet, Rfl: 11   loratadine (CLARITIN) 10 MG tablet, Take 10 mg by mouth daily., Disp: , Rfl:    losartan (COZAAR) 25 MG tablet, Take 1 tablet (25 mg total) by mouth daily. Do not take for a systolic less than 379, Disp: 30 tablet, Rfl: 3   mycophenolate (CELLCEPT) 250 MG capsule, TAKE 2 CAPSULES BY MOUTH TWICE DAILY (Patient taking differently: Take 500 mg by mouth 2 (two) times daily.), Disp: 120 capsule, Rfl: 5   MYRBETRIQ 50 MG TB24 tablet, Take 50 mg by mouth daily., Disp: , Rfl:     nitroGLYCERIN (NITROSTAT) 0.4 MG SL tablet, Place 1 tablet (0.4 mg total) under the tongue every 5 (five) minutes as needed for chest pain., Disp: 25 tablet, Rfl: 5   pantoprazole (PROTONIX) 40 MG tablet, Take 1 tablet (40 mg total) by mouth daily. 40mg  twice daily until 04/11/21, then reduce to 40mg  daily thereafter (Patient taking differently: Take 40 mg by mouth daily.), Disp: 90 tablet, Rfl: 2   Polyethyl Glycol-Propyl Glycol (SYSTANE) 0.4-0.3 % SOLN, Place 1 drop into both eyes daily as needed (dry eyes)., Disp: , Rfl:    pyridostigmine (MESTINON) 60 MG tablet, Take 2 tablets by mouth four times daily (Patient taking differently: Take 120 mg by mouth every 4 (four) hours while awake.), Disp: 720 tablet, Rfl: 3  saccharomyces boulardii (FLORASTOR) 250 MG capsule, Take 250 mg by mouth 2 (two) times daily., Disp: , Rfl:    spironolactone (ALDACTONE) 25 MG tablet, Take 1 tablet (25 mg total) by mouth daily., Disp: 30 tablet, Rfl: 3   tamsulosin (FLOMAX) 0.4 MG CAPS capsule, Take 1 capsule (0.4 mg total) by mouth daily., Disp: 60 capsule, Rfl: 2   traZODone (DESYREL) 50 MG tablet, Take 0.5-1 tablets (25-50 mg total) by mouth at bedtime as needed for sleep., Disp: 30 tablet, Rfl: 5   Wheat Dextrin (BENEFIBER) POWD, Take 2 Scoops by mouth every other day., Disp: , Rfl:   PAST MEDICAL HISTORY: Past Medical History:  Diagnosis Date   Aortic insufficiency    Ascending aorta dilation (HCC)    Balance problem    CAD (coronary artery disease)    previous stent to LAD 2003   Diabetes mellitus without complication ([place])    No longer taking diabetic medications at this time   GI bleeding    HFrEF (heart failure with reduced ejection fraction) (HCC)    History of complete heart block    History of kidney stones    Hyperlipidemia    Hypertension    Iron deficiency anemia    LBBB (left bundle branch block)    Melanoma (HCC)    Myasthenia gravis (HCC)    eyelids, finger, and toes   NSVT (nonsustained  ventricular tachycardia)    OSA (obstructive sleep apnea)    Does not wear CPAP.   Presence of permanent cardiac pacemaker    S/P cardiac pacemaker procedure, placement 10/24/14 Medtronic Adapta L model ADDRL 1  10/25/2014   Spinal stenosis    Severe   Tricuspid regurgitation     PAST SURGICAL HISTORY: Past Surgical History:  Procedure Laterality Date   COLONOSCOPY     CORONARY ANGIOPLASTY WITH STENT PLACEMENT  03/01/2002   stenting to LAD - Dr. Glade Lloyd   CYSTOSCOPY/URETEROSCOPY/HOLMIUM LASER/STENT PLACEMENT Left 08/31/2019   Procedure: CYSTOSCOPY/RETROGRADE/STENT PLACEMENT;  Surgeon: Ceasar Mons, MD;  Location: [facility];  Service: Urology;  Laterality: Left;  ONY NEEDS 45 MIN   NM MYOCAR PERF WALL MOTION  11/27/2010   normal   PERMANENT PACEMAKER INSERTION N/A 10/24/2014   Procedure: PERMANENT PACEMAKER INSERTION;  Surgeon: [name], MD;  Location: [facility] CATH LAB;  Service: Cardiovascular;  Laterality: N/A;   TONSILLECTOMY     TRANSURETHRAL RESECTION OF PROSTATE N/A 07/06/2019   Procedure: TRANSURETHRAL RESECTION OF THE PROSTATE (TURP), BIPOLAR;  Surgeon: Ceasar Mons, MD;  Location[facility];  Service: Urology;  Laterality: N/A;   US ECHOCARDIOGRAPHY  12/05/2010   proximal septal thickening,mild concentric LVH,mild deptal hypokinesis,RV mildly dilated,LA mildly dilated,mild AI,moderate aortic root dilatation,septal motion c/w conduction abnormality    FAMILY HISTORY: Family History  Problem Relation Age of Onset   Breast cancer Mother    Colon cancer Father    Lung cancer Father     SOCIAL HISTORY:  Social History   Socioeconomic History   Marital status: Married    Spouse name: Not on file   Number of children: Not on file   Years of education: Not on file   Highest education level: Not on file  Occupational History   Not on file  Tobacco Use   Smoking status: Former    Types: Cigarettes, Cigars    Start  date: 04/28/1949    Quit date: 10/27/1982    Years since quitting: 38.6   Smokeless tobacco: Never  Vaping Use   Vaping Use: Never used  Substance and Sexual Activity   Alcohol use: Not Currently    Comment: 1-2 drink per week   Drug use: No   Sexual activity: Not on file  Other Topics Concern   Not on file  Social History Narrative   Not on file   Social Determinants of Health   Financial Resource Strain: Not on file  Food Insecurity: Not on file  Transportation Needs: Not on file  Physical Activity: Not on file  Stress: Not on file  Social Connections: Not on file  Intimate Partner Violence: Not on file     PHYSICAL EXAM  Vitals:   05/29/21 1439  BP: (!) 109/53  Pulse: 61  Weight: 145 lb (65.8 kg)  Height: 5\' 9"  (1.753 m)    Body mass index is 21.41 kg/m.     General: The patient is well-developed and well-nourished and in no acute distress   Neurologic Exam  Mental status: The patient is alert and oriented x 3 at the time of the examination. The patient has apparent normal recent and remote memory, with an apparently normal attention span and concentration ability.   Speech is normal.  Cranial nerves: Extraocular muscles were intact even with prolonged upgaze. Ptosis is better after surgery   He has slightly reduced neck extension strength.  The voice is strong.. Trapezius is strong.    No obvious hearing deficits are noted.  Motor:  Muscle bulk is normal.   Tone is normal. Strength is  5 / 5 in the arms shoulders and neck.  Strength is 4-/5 in the L5 innervated foot and ankle muscles on the left and 5/5 elsewhere in the left leg.  EHL is 4/5 right and 4+/5 left and he is 5/5 elsewhere on the right.  He is unable to stand up from the chair without using his arms.  He had been able to do this once at the last visit.  Sensory: He has reduced sensation in the left foot over the L5 dermatome.  Coordination: Cerebellar testing reveals good finger-nose-finger and  mildly reduced left heel-to-shin .  Gait and station: Station is normal.  Stride is reduced wide and gait is arthritic.  He turns 180 degrees in 4-5 steps.  He cannot do a tandem walk. Romberg is negative.   Reflexes: Deep tendon reflexes are symmetric and normal in the arms and knees but absent at the ankles    DIAGNOSTIC DATA (LABS, IMAGING, TESTING) - I reviewed patient records, labs, notes, testing and imaging myself where available.  Lab Results  Component Value Date   WBC 5.9 05/08/2021   HGB 13.9 05/08/2021   HCT 42.0 05/08/2021   MCV 90 05/08/2021   PLT 223 05/08/2021      Component Value Date/Time   NA 132 (L) 05/08/2021 1451   K 4.4 05/08/2021 1451   CL 94 (L) 05/08/2021 1451   CO2 24 05/08/2021 1451   GLUCOSE 87 05/08/2021 1451   GLUCOSE 84 03/22/2021 0321   BUN 13 05/08/2021 1451   CREATININE 0.73 (L) 05/08/2021 1451   CALCIUM 8.7 05/08/2021 1451   PROT 5.6 (L) 03/21/2021 1303   PROT 5.8 (L) 11/20/2020 1529   ALBUMIN 3.2 (L) 03/21/2021 1303   ALBUMIN 4.1 11/20/2020 1529   AST 25 03/21/2021 1303   ALT 23 03/21/2021 1303   ALKPHOS 59 03/21/2021 1303   BILITOT 1.0 03/21/2021 1303   BILITOT 0.9 11/20/2020 1529   GFRNONAA >60 03/22/2021  0321   GFRAA 105 11/17/2019 1419   No results found for: CHOL, HDL, LDLCALC, LDLDIRECT, TRIG, CHOLHDL Lab Results  Component Value Date   HGBA1C 6.6 (H) 10/24/2014   Lab Results  Component Value Date   ZJIRCVEL38 101 05/08/2021   Lab Results  Component Value Date   TSH 1.780 11/20/2020       ASSESSMENT AND PLAN    1. Myasthenia gravis ([place])   2. High risk medication use   3. Gait abnormality   4. Urinary frequency   5. OSA (obstructive sleep apnea)   6. Diplopia      1..  Continue CellCept 500 mg twice a day and pyridostigmine 60 mg 8 pills a day for the myasthenia gravis.  We will check labs.   If symptoms worsen consider IVIG.  He gets blood work regularly.  The last lymphocyte counts and LFTs were  fine. 2.  Continue desmopressin for nocturia.  He is advised to take Imodium for his diarrhea..    3.   His snoring improved after he lost weight, he probably no longer needs a CPAP.  He prefers not to do another sleep study to determine if he still has OSA. 4.    He will return to see me in 5-6 months but call sooner if he has new or worsening neurologic symptoms.      Faizan Geraci A. Felecia Shelling, MD, PhD 75/07/256, 5:27 PM Certified in Neurology, Clinical Neurophysiology, Sleep Medicine, Pain Medicine and Neuroimaging  [facility] [address] [place], [place] [address] [phone]

## 2021-06-17 ENCOUNTER — Ambulatory Visit (INDEPENDENT_AMBULATORY_CARE_PROVIDER_SITE_OTHER): Payer: Medicare Other

## 2021-06-17 DIAGNOSIS — I442 Atrioventricular block, complete: Secondary | ICD-10-CM

## 2021-06-22 LAB — CUP PACEART REMOTE DEVICE CHECK
Battery Impedance: 791 Ohm
Battery Remaining Longevity: 69 mo
Battery Voltage: 2.77 V
Brady Statistic AP VP Percent: 13 %
Brady Statistic AP VS Percent: 0 %
Brady Statistic AS VP Percent: 70 %
Brady Statistic AS VS Percent: 17 %
Date Time Interrogation Session: 20221123094417
Implantable Lead Implant Date: 20160329
Implantable Lead Implant Date: 20160329
Implantable Lead Location: 753859
Implantable Lead Location: 753860
Implantable Lead Model: 5076
Implantable Lead Model: 5092
Implantable Pulse Generator Implant Date: 20160329
Lead Channel Impedance Value: 408 Ohm
Lead Channel Impedance Value: 596 Ohm
Lead Channel Pacing Threshold Amplitude: 0.625 V
Lead Channel Pacing Threshold Amplitude: 0.875 V
Lead Channel Pacing Threshold Pulse Width: 0.4 ms
Lead Channel Pacing Threshold Pulse Width: 0.4 ms
Lead Channel Setting Pacing Amplitude: 2 V
Lead Channel Setting Pacing Amplitude: 2.5 V
Lead Channel Setting Pacing Pulse Width: 0.4 ms
Lead Channel Setting Sensing Sensitivity: 2.8 mV

## 2021-06-25 NOTE — Progress Notes (Signed)
Remote pacemaker transmission.   

## 2021-07-29 ENCOUNTER — Other Ambulatory Visit: Payer: Self-pay | Admitting: Neurology

## 2021-07-31 NOTE — Progress Notes (Signed)
David [name] coronary artery disease, CHF, hypertension and history of complete heart block status post pacemaker. Patient had PCI of his LAD in 2003.  He had a pacemaker placed in 2016.  Echocardiogram March 2016 showed normal LV function, moderate left ventricular hypertrophy, grade 1 diastolic dysfunction, mild aortic insufficiency, mildly dilated aortic root, moderate tricuspid regurgitation.  Nuclear study September 2016 showed ejection fraction 43%, inferior thinning but no ischemia.  Echocardiogram repeated in August 2022 and showed ejection fraction 20 to 25%, mild left ventricular enlargement, mild left ventricular hypertrophy, grade 1 diastolic dysfunction, mild right ventricular enlargement, moderate left atrial enlargement, severe right atrial enlargement, mild mitral regurgitation, moderate tricuspid regurgitation, moderate to severe aortic insufficiency, dilated ascending aorta measuring 45 mm.  At previous office visit patient was volume overloaded and admitted for IV diuresis and medication adjustment.  Patient was noted to be anemic; placed on PPI and GI recommended no further evaluation.  Plan initially was for cardiac catheterization given reduced LV function but given age and comorbidities medical therapy was pursued.  Following discharge he was readmitted with low blood pressure and Entresto was discontinued. CTA October 2022 showed 44 mm ascending thoracic aortic aneurysm.  Since last seen he denies increased dyspnea, chest pain, palpitations, syncope or pedal edema.  Current Outpatient Medications  Medication Sig Dispense Refill   aspirin EC 81 MG tablet Take 81 mg by mouth daily.      carvedilol (COREG) 3.125 MG tablet Take 1 tablet (3.125 mg total) by mouth 2 (two) times daily. 180 tablet 3   Cholecalciferol (VITAMIN D3) 50 MCG (2000 UT) CAPS Take 1 capsule by mouth daily.     desmopressin (DDAVP) 0.1 MG tablet Take 1 tablet (0.1 mg total) by mouth at bedtime. Please keep  upcoming appt for further refills 30 tablet 2   digoxin (LANOXIN) 0.125 MG tablet Take 1 tablet (0.125 mg total) by mouth daily. 90 tablet 2   empagliflozin (JARDIANCE) 10 MG TABS tablet Take 1 tablet (10 mg total) by mouth daily. 90 tablet 2   Ensure (ENSURE) Take 237 mLs by mouth daily as needed (feeding suppliment).     ezetimibe-simvastatin (VYTORIN) 10-40 MG tablet Take 1 tablet by mouth daily.     ferrous sulfate 325 (65 FE) MG tablet Take 1 tablet (325 mg total) by mouth 2 (two) times daily with a meal. 180 tablet 2   loratadine (CLARITIN) 10 MG tablet Take 10 mg by mouth daily.     mycophenolate (CELLCEPT) 250 MG capsule TAKE 2 CAPSULES BY MOUTH TWICE DAILY 120 capsule 5   MYRBETRIQ 50 MG TB24 tablet Take 50 mg by mouth daily.     nitroGLYCERIN (NITROSTAT) 0.4 MG SL tablet Place 1 tablet (0.4 mg total) under the tongue every 5 (five) minutes as needed for chest pain. 25 tablet 5   pantoprazole (PROTONIX) 40 MG tablet Take 1 tablet (40 mg total) by mouth daily. 40mg  twice daily until 04/11/21, then reduce to 40mg  daily thereafter (Patient taking differently: Take 40 mg by mouth daily.) 90 tablet 2   Polyethyl Glycol-Propyl Glycol (SYSTANE) 0.4-0.3 % SOLN Place 1 drop into both eyes daily as needed (dry eyes).     pyridostigmine (MESTINON) 60 MG tablet Take 2 tablets by mouth four times daily (Patient taking differently: Take 120 mg by mouth every 4 (four) hours while awake.) 720 tablet 3   saccharomyces boulardii (FLORASTOR) 250 MG capsule Take 250 mg by mouth 2 (two) times daily.  spironolactone (ALDACTONE) 25 MG tablet Take 1 tablet (25 mg total) by mouth daily. 30 tablet 3   tamsulosin (FLOMAX) 0.4 MG CAPS capsule Take 1 capsule (0.4 mg total) by mouth daily. 60 capsule 2   traZODone (DESYREL) 50 MG tablet Take 0.5-1 tablets (25-50 mg total) by mouth at bedtime as needed for sleep. 30 tablet 5   Wheat Dextrin (BENEFIBER) POWD Take 2 Scoops by mouth every other day.     furosemide  (LASIX) 20 MG tablet Take 1 tablet (20 mg total) by mouth as needed for edema (for weight gain of 2-3lb and ankle edema). (Patient not taking: Reported on 08/14/2021) 30 tablet 11   losartan (COZAAR) 25 MG tablet Take 1 tablet (25 mg total) by mouth daily. Do not take for a systolic less than 371 30 tablet 3   No current facility-administered medications for this visit.     Past Medical History:  Diagnosis Date   Aortic insufficiency    Ascending aorta dilation (HCC)    Balance problem    CAD (coronary artery disease)    previous stent to LAD 2003   Diabetes mellitus without complication (HCC)    No longer taking diabetic medications at this time   GI bleeding    HFrEF (heart failure with reduced ejection fraction) ([place])    History of complete heart block    History of kidney stones    Hyperlipidemia    Hypertension    Iron deficiency anemia    LBBB (left bundle branch block)    Melanoma (HCC)    Myasthenia gravis (HCC)    eyelids, finger, and toes   NSVT (nonsustained ventricular tachycardia)    OSA (obstructive sleep apnea)    Does not wear CPAP.   Presence of permanent cardiac pacemaker    S/P cardiac pacemaker procedure, placement 10/24/14 Medtronic Adapta L model ADDRL 1  10/25/2014   Spinal stenosis    Severe   Tricuspid regurgitation     Past Surgical History:  Procedure Laterality Date   COLONOSCOPY     CORONARY ANGIOPLASTY WITH STENT PLACEMENT  03/01/2002   stenting to LAD - Dr. Glade Lloyd   CYSTOSCOPY/URETEROSCOPY/HOLMIUM LASER/STENT PLACEMENT Left 08/31/2019   Procedure: CYSTOSCOPY/RETROGRADE/STENT PLACEMENT;  Surgeon: Ceasar Mons, MD;  Location: [facility];  Service: Urology;  Laterality: Left;  ONY NEEDS 45 MIN   NM MYOCAR PERF WALL MOTION  11/27/2010   normal   PERMANENT PACEMAKER INSERTION N/A 10/24/2014   Procedure: PERMANENT PACEMAKER INSERTION;  Surgeon: [name], MD;  Location: [facility] CATH LAB;  Service: Cardiovascular;  Laterality:  N/A;   TONSILLECTOMY     TRANSURETHRAL RESECTION OF PROSTATE N/A 07/06/2019   Procedure: TRANSURETHRAL RESECTION OF THE PROSTATE (TURP), BIPOLAR;  Surgeon: Ceasar Mons, MD;  Location: [facility];  Service: Urology;  Laterality: N/A;   US ECHOCARDIOGRAPHY  12/05/2010   proximal septal thickening,mild concentric LVH,mild deptal hypokinesis,RV mildly dilated,LA mildly dilated,mild AI,moderate aortic root dilatation,septal motion c/w conduction abnormality    Social History   Socioeconomic History   Marital status: Married    Spouse name: Not on file   Number of children: Not on file   Years of education: Not on file   Highest education level: Not on file  Occupational History   Not on file  Tobacco Use   Smoking status: Former    Types: Cigarettes, Cigars    Start date: 04/28/1949    Quit date: 10/27/1982    Years since quitting:  38.8   Smokeless tobacco: Never  Vaping Use   Vaping Use: Never used  Substance and Sexual Activity   Alcohol use: Not Currently    Comment: 1-2 drink per week   Drug use: No   Sexual activity: Not on file  Other Topics Concern   Not on file  Social History Narrative   Not on file   Social Determinants of Health   Financial Resource Strain: Not on file  Food Insecurity: Not on file  Transportation Needs: Not on file  Physical Activity: Not on file  Stress: Not on file  Social Connections: Not on file  Intimate Partner Violence: Not on file    Family History  Problem Relation Age of Onset   Breast cancer Mother    Colon cancer Father    Lung cancer Father     ROS: no fevers or chills, productive cough, hemoptysis, dysphasia, odynophagia, melena, hematochezia, dysuria, hematuria, rash, seizure activity, orthopnea, PND, pedal edema, claudication. Remaining systems are negative.  Physical Exam: Well-developed somewhat frail in no acute distress.  Skin is warm and dry.  HEENT is normal.  Neck is supple.  Chest is  clear to auscultation with normal expansion.  Cardiovascular exam is regular rate and rhythm.  Abdominal exam nontender or distended. No masses palpated. Extremities show no edema. neuro grossly intact  A/P  1 chronic systolic congestive heart failure-patient's volume status is reasonable.  Continue Lasix, spironolactone and Jardiance.  Check potassium and renal function.  2 cardiomyopathy-etiology unclear.  Possibly secondary to coronary disease versus dyssynchrony related to left bundle branch block versus aortic insufficiency.  Blood pressure is low in the office today but it is better at home.  I have asked him to bring his cuff and we will correlate with ours to make sure it is accurate.  For now hold losartan if systolic blood pressure is in the mid 80s.  Note he denies syncope.  We will continue carvedilol and digoxin. I considered cardiac catheterization to further assess the patient is somewhat frail with multiple comorbidities.  We will therefore continue medical therapy and he is in agreement.  I will repeat echocardiogram to see if LV function has improved on therapy.  3 dilated aortic root/aortic insufficiency-I do not think he would be a good candidate for aortic root/aortic valve replacement.  They are in agreement and we will therefore continue conservative measures unless he makes significant improvement.  4 coronary disease-continue aspirin and statin.  5 hypertension-blood pressure controlled.  Continue present medications.  6 hyperlipidemia-continue Vytorin.  Check lipids and liver.  7 pacemaker-Per electrophysiology.  Patient would benefit from a chair that can recline for his congestive heart failure.  Kirk Ruths, MD

## 2021-08-08 DIAGNOSIS — L570 Actinic keratosis: Secondary | ICD-10-CM | POA: Diagnosis not present

## 2021-08-08 DIAGNOSIS — D0361 Melanoma in situ of right upper limb, including shoulder: Secondary | ICD-10-CM | POA: Diagnosis not present

## 2021-08-08 DIAGNOSIS — D225 Melanocytic nevi of trunk: Secondary | ICD-10-CM | POA: Diagnosis not present

## 2021-08-08 DIAGNOSIS — D692 Other nonthrombocytopenic purpura: Secondary | ICD-10-CM | POA: Diagnosis not present

## 2021-08-08 DIAGNOSIS — Z85828 Personal history of other malignant neoplasm of skin: Secondary | ICD-10-CM | POA: Diagnosis not present

## 2021-08-14 ENCOUNTER — Encounter: Payer: Self-pay | Admitting: Cardiology

## 2021-08-14 ENCOUNTER — Other Ambulatory Visit: Payer: Self-pay

## 2021-08-14 ENCOUNTER — Ambulatory Visit: Payer: Medicare Other | Admitting: Cardiology

## 2021-08-14 VITALS — BP 84/56 | HR 62 | Ht 69.0 in | Wt 154.6 lb

## 2021-08-14 DIAGNOSIS — I351 Nonrheumatic aortic (valve) insufficiency: Secondary | ICD-10-CM

## 2021-08-14 DIAGNOSIS — I10 Essential (primary) hypertension: Secondary | ICD-10-CM

## 2021-08-14 DIAGNOSIS — I2510 Atherosclerotic heart disease of native coronary artery without angina pectoris: Secondary | ICD-10-CM

## 2021-08-14 DIAGNOSIS — I7121 Aneurysm of the ascending aorta, without rupture: Secondary | ICD-10-CM

## 2021-08-14 DIAGNOSIS — I25118 Atherosclerotic heart disease of native coronary artery with other forms of angina pectoris: Secondary | ICD-10-CM

## 2021-08-14 DIAGNOSIS — I5022 Chronic systolic (congestive) heart failure: Secondary | ICD-10-CM | POA: Diagnosis not present

## 2021-08-14 DIAGNOSIS — Z950 Presence of cardiac pacemaker: Secondary | ICD-10-CM

## 2021-08-14 LAB — COMPREHENSIVE METABOLIC PANEL
ALT: 14 IU/L (ref 0–44)
AST: 21 IU/L (ref 0–40)
Albumin/Globulin Ratio: 2.2 (ref 1.2–2.2)
Albumin: 3.9 g/dL (ref 3.6–4.6)
Alkaline Phosphatase: 70 IU/L (ref 44–121)
BUN/Creatinine Ratio: 26 — ABNORMAL HIGH (ref 10–24)
BUN: 17 mg/dL (ref 8–27)
Bilirubin Total: 1.1 mg/dL (ref 0.0–1.2)
CO2: 27 mmol/L (ref 20–29)
Calcium: 8.7 mg/dL (ref 8.6–10.2)
Chloride: 101 mmol/L (ref 96–106)
Creatinine, Ser: 0.65 mg/dL — ABNORMAL LOW (ref 0.76–1.27)
Globulin, Total: 1.8 g/dL (ref 1.5–4.5)
Glucose: 81 mg/dL (ref 70–99)
Potassium: 4 mmol/L (ref 3.5–5.2)
Sodium: 136 mmol/L (ref 134–144)
Total Protein: 5.7 g/dL — ABNORMAL LOW (ref 6.0–8.5)
eGFR: 93 mL/min/{1.73_m2} (ref >59)

## 2021-08-14 LAB — LIPID PANEL
Chol/HDL Ratio: 2.1 ratio (ref 0.0–5.0)
Cholesterol, Total: 111 mg/dL (ref 100–199)
HDL: 53 mg/dL (ref >39)
LDL Chol Calc (NIH): 45 mg/dL (ref 0–99)
Triglycerides: 58 mg/dL (ref 0–149)
VLDL Cholesterol Cal: 13 mg/dL (ref 5–40)

## 2021-08-14 NOTE — Patient Instructions (Signed)
Medication Instructions:   STOP LOSARTAN  *If you need a refill on your cardiac medications before your next appointment, please call your pharmacy*   Testing/Procedures:  Your physician has requested that you have an echocardiogram. Echocardiography is a painless test that uses sound waves to create images of your heart. It provides your doctor with information about the size and shape of your heart and how well your hearts chambers and valves are working. This procedure takes approximately one hour. There are no restrictions for this procedure. [place]   Follow-Up: At [facility], you and your health needs are our priority.  As part of our continuing mission to provide you with exceptional heart care, we have created designated Provider Care Teams.  These Care Teams include your primary Cardiologist (physician) and Advanced Practice Providers (APPs -  Physician Assistants and Nurse Practitioners) who all work together to provide you with the care you need, when you need it.  We recommend signing up for the patient portal called "MyChart".  Sign up information is provided on this After Visit Summary.  MyChart is used to connect with patients for Virtual Visits (Telemedicine).  Patients are able to view lab/test results, encounter notes, upcoming appointments, etc.  Non-urgent messages can be sent to your provider as well.   To learn more about what you can do with MyChart, go to [email or web address].    Your next appointment:   3 month(s)  The format for your next appointment:   In Person  Provider:   Kirk Ruths, MD

## 2021-08-19 ENCOUNTER — Encounter: Payer: Self-pay | Admitting: *Deleted

## 2021-08-22 ENCOUNTER — Other Ambulatory Visit: Payer: Self-pay | Admitting: Cardiology

## 2021-09-02 DIAGNOSIS — D0361 Melanoma in situ of right upper limb, including shoulder: Secondary | ICD-10-CM | POA: Diagnosis not present

## 2021-09-02 DIAGNOSIS — Z85828 Personal history of other malignant neoplasm of skin: Secondary | ICD-10-CM | POA: Diagnosis not present

## 2021-09-04 ENCOUNTER — Other Ambulatory Visit (HOSPITAL_COMMUNITY): Payer: Medicare Other

## 2021-09-10 ENCOUNTER — Other Ambulatory Visit: Payer: Self-pay

## 2021-09-10 ENCOUNTER — Other Ambulatory Visit: Payer: Self-pay | Admitting: Cardiology

## 2021-09-10 ENCOUNTER — Ambulatory Visit (HOSPITAL_COMMUNITY): Payer: Medicare Other | Attending: Cardiology

## 2021-09-10 DIAGNOSIS — I5022 Chronic systolic (congestive) heart failure: Secondary | ICD-10-CM | POA: Diagnosis not present

## 2021-09-10 LAB — ECHOCARDIOGRAM COMPLETE
Area-P 1/2: 2.12 cm2
P 1/2 time: 547 msec
S' Lateral: 3.6 cm

## 2021-09-10 NOTE — Telephone Encounter (Signed)
Dr. Stanford Breed stopped medication.

## 2021-09-12 ENCOUNTER — Encounter: Payer: Self-pay | Admitting: *Deleted

## 2021-09-16 ENCOUNTER — Ambulatory Visit (INDEPENDENT_AMBULATORY_CARE_PROVIDER_SITE_OTHER): Payer: Medicare Other

## 2021-09-16 DIAGNOSIS — I442 Atrioventricular block, complete: Secondary | ICD-10-CM | POA: Diagnosis not present

## 2021-09-17 LAB — CUP PACEART REMOTE DEVICE CHECK
Battery Impedance: 870 Ohm
Battery Remaining Longevity: 65 mo
Battery Voltage: 2.77 V
Brady Statistic AP VP Percent: 19 %
Brady Statistic AP VS Percent: 0 %
Brady Statistic AS VP Percent: 65 %
Brady Statistic AS VS Percent: 16 %
Date Time Interrogation Session: 20230221103846
Implantable Lead Implant Date: 20160329
Implantable Lead Implant Date: 20160329
Implantable Lead Location: 753859
Implantable Lead Location: 753860
Implantable Lead Model: 5076
Implantable Lead Model: 5092
Implantable Pulse Generator Implant Date: 20160329
Lead Channel Impedance Value: 404 Ohm
Lead Channel Impedance Value: 558 Ohm
Lead Channel Pacing Threshold Amplitude: 0.625 V
Lead Channel Pacing Threshold Amplitude: 0.875 V
Lead Channel Pacing Threshold Pulse Width: 0.4 ms
Lead Channel Pacing Threshold Pulse Width: 0.4 ms
Lead Channel Setting Pacing Amplitude: 2 V
Lead Channel Setting Pacing Amplitude: 2.5 V
Lead Channel Setting Pacing Pulse Width: 0.4 ms
Lead Channel Setting Sensing Sensitivity: 2.8 mV

## 2021-09-19 NOTE — Progress Notes (Signed)
Remote pacemaker transmission.   

## 2021-10-03 ENCOUNTER — Other Ambulatory Visit: Payer: Self-pay | Admitting: Cardiology

## 2021-10-19 ENCOUNTER — Other Ambulatory Visit: Payer: Self-pay | Admitting: Neurology

## 2021-10-28 DIAGNOSIS — H10503 Unspecified blepharoconjunctivitis, bilateral: Secondary | ICD-10-CM | POA: Diagnosis not present

## 2021-12-03 ENCOUNTER — Ambulatory Visit: Payer: Medicare Other | Admitting: Neurology

## 2021-12-10 NOTE — Progress Notes (Signed)
HPI: Follow-up coronary artery disease, CHF, hypertension and history of complete heart block status post pacemaker. Patient had PCI of his LAD in 2003.  He had a pacemaker placed in 2016.  Echocardiogram March 2016 showed normal LV function, moderate left ventricular hypertrophy, grade 1 diastolic dysfunction, mild aortic insufficiency, mildly dilated aortic root, moderate tricuspid regurgitation.  Nuclear study September 2016 showed ejection fraction 43%, inferior thinning but no ischemia.  Echocardiogram repeated in August 2022 and showed ejection fraction 20 to 25%, mild left ventricular enlargement, mild left ventricular hypertrophy, grade 1 diastolic dysfunction, mild right ventricular enlargement, moderate left atrial enlargement, severe right atrial enlargement, mild mitral regurgitation, moderate tricuspid regurgitation, moderate to severe aortic insufficiency, dilated ascending aorta measuring 45 mm.  At previous office visit patient was volume overloaded and admitted for IV diuresis and medication adjustment.  Patient was noted to be anemic; placed on PPI and GI recommended no further evaluation.  Plan initially was for cardiac catheterization given reduced LV function but given age and comorbidities medical therapy was pursued.  Following discharge he was readmitted with low blood pressure and Entresto was discontinued. CTA October 2022 showed 44 mm ascending thoracic aortic aneurysm.  Echocardiogram February 2023 showed ejection fraction 40 to 45%, mild left ventricular hypertrophy, grade 1 diastolic dysfunction, mild left atrial enlargement, moderate aortic insufficiency, mildly dilated aortic root at 45 mm, moderate dilatation of ascending aorta at 48 mm.  Since last seen he denies dyspnea, chest pain, palpitations or syncope.  Current Outpatient Medications  Medication Sig Dispense Refill   aspirin EC 81 MG tablet Take 81 mg by mouth daily.      carvedilol (COREG) 3.125 MG tablet Take  1 tablet (3.125 mg total) by mouth 2 (two) times daily. 180 tablet 3   Cholecalciferol (VITAMIN D3) 50 MCG (2000 UT) CAPS Take 1 capsule by mouth daily.     desmopressin (DDAVP) 0.1 MG tablet Take 1 tablet (100 mcg total) by mouth at bedtime. 90 tablet 1   digoxin (LANOXIN) 0.125 MG tablet Take 1 tablet (0.125 mg total) by mouth daily. 90 tablet 2   empagliflozin (JARDIANCE) 10 MG TABS tablet TAKE 1 TABLET BY MOUTH EVERY DAY 90 tablet 3   ezetimibe-simvastatin (VYTORIN) 10-40 MG tablet Take 1 tablet by mouth daily.     ferrous sulfate 325 (65 FE) MG tablet Take 1 tablet (325 mg total) by mouth 2 (two) times daily with a meal. 180 tablet 2   furosemide (LASIX) 20 MG tablet Take 1 tablet (20 mg total) by mouth as needed for edema (for weight gain of 2-3lb and ankle edema). 30 tablet 11   loratadine (CLARITIN) 10 MG tablet Take 10 mg by mouth daily.     mycophenolate (CELLCEPT) 250 MG capsule TAKE 2 CAPSULES BY MOUTH TWICE DAILY 120 capsule 5   MYRBETRIQ 50 MG TB24 tablet Take 50 mg by mouth daily.     nitroGLYCERIN (NITROSTAT) 0.4 MG SL tablet Place 1 tablet (0.4 mg total) under the tongue every 5 (five) minutes as needed for chest pain. 25 tablet 5   pantoprazole (PROTONIX) 40 MG tablet Take 1 tablet (40 mg total) by mouth daily. '40mg'$  twice daily until 04/11/21, then reduce to '40mg'$  daily thereafter (Patient taking differently: Take 40 mg by mouth daily.) 90 tablet 2   Polyethyl Glycol-Propyl Glycol (SYSTANE) 0.4-0.3 % SOLN Place 1 drop into both eyes daily as needed (dry eyes).     pyridostigmine (MESTINON) 60 MG tablet Take 2  tablets by mouth four times daily (Patient taking differently: Take 120 mg by mouth every 4 (four) hours while awake.) 720 tablet 3   saccharomyces boulardii (FLORASTOR) 250 MG capsule Take 250 mg by mouth 2 (two) times daily.     spironolactone (ALDACTONE) 25 MG tablet TAKE 1 TABLET(25 MG) BY MOUTH DAILY 30 tablet 3   tamsulosin (FLOMAX) 0.4 MG CAPS capsule Take 1 capsule (0.4  mg total) by mouth daily. 60 capsule 2   Wheat Dextrin (BENEFIBER) POWD Take 2 Scoops by mouth every other day.     losartan (COZAAR) 25 MG tablet Take 1 tablet (25 mg total) by mouth daily. Do not take for a systolic less than 938 (Patient not taking: Reported on 12/18/2021) 30 tablet 3   No current facility-administered medications for this visit.     Past Medical History:  Diagnosis Date   Aortic insufficiency    Ascending aorta dilation (HCC)    Balance problem    CAD (coronary artery disease)    previous stent to LAD 2003   Diabetes mellitus without complication (HCC)    No longer taking diabetic medications at this time   GI bleeding    HFrEF (heart failure with reduced ejection fraction) ([place])    History of complete heart block    History of kidney stones    Hyperlipidemia    Hypertension    Iron deficiency anemia    LBBB (left bundle branch block)    Melanoma (HCC)    Myasthenia gravis (HCC)    eyelids, finger, and toes   NSVT (nonsustained ventricular tachycardia) (HCC)    OSA (obstructive sleep apnea)    Does not wear CPAP.   Presence of permanent cardiac pacemaker    S/P cardiac pacemaker procedure, placement 10/24/14 Medtronic Adapta L model ADDRL 1  10/25/2014   Spinal stenosis    Severe   Tricuspid regurgitation     Past Surgical History:  Procedure Laterality Date   COLONOSCOPY     CORONARY ANGIOPLASTY WITH STENT PLACEMENT  03/01/2002   stenting to LAD - Dr. Glade Lloyd   CYSTOSCOPY/URETEROSCOPY/HOLMIUM LASER/STENT PLACEMENT Left 08/31/2019   Procedure: CYSTOSCOPY/RETROGRADE/STENT PLACEMENT;  Surgeon: Ceasar Mons, MD;  Location: [facility];  Service: Urology;  Laterality: Left;  ONY NEEDS 45 MIN   NM MYOCAR PERF WALL MOTION  11/27/2010   normal   PERMANENT PACEMAKER INSERTION N/A 10/24/2014   Procedure: PERMANENT PACEMAKER INSERTION;  Surgeon: [name], MD;  Location: [facility] CATH LAB;  Service: Cardiovascular;  Laterality: N/A;    TONSILLECTOMY     TRANSURETHRAL RESECTION OF PROSTATE N/A 07/06/2019   Procedure: TRANSURETHRAL RESECTION OF THE PROSTATE (TURP), BIPOLAR;  Surgeon: Ceasar Mons, MD;  Location: [facility];  Service: Urology;  Laterality: N/A;   US ECHOCARDIOGRAPHY  12/05/2010   proximal septal thickening,mild concentric LVH,mild deptal hypokinesis,RV mildly dilated,LA mildly dilated,mild AI,moderate aortic root dilatation,septal motion c/w conduction abnormality    Social History   Socioeconomic History   Marital status: Married    Spouse name: Not on file   Number of children: Not on file   Years of education: Not on file   Highest education level: Not on file  Occupational History   Not on file  Tobacco Use   Smoking status: Former    Types: Cigarettes, Cigars    Start date: 04/28/1949    Quit date: 10/27/1982    Years since quitting: 39.1   Smokeless tobacco: Never  Vaping Use  Vaping Use: Never used  Substance and Sexual Activity   Alcohol use: Not Currently    Comment: 1-2 drink per week   Drug use: No   Sexual activity: Not on file  Other Topics Concern   Not on file  Social History Narrative   Not on file   Social Determinants of Health   Financial Resource Strain: Not on file  Food Insecurity: Not on file  Transportation Needs: Not on file  Physical Activity: Not on file  Stress: Not on file  Social Connections: Not on file  Intimate Partner Violence: Not on file    Family History  Problem Relation Age of Onset   Breast cancer Mother    Colon cancer Father    Lung cancer Father     ROS: no fevers or chills, productive cough, hemoptysis, dysphasia, odynophagia, melena, hematochezia, dysuria, hematuria, rash, seizure activity, orthopnea, PND, pedal edema, claudication. Remaining systems are negative.  Physical Exam: Well-developed well-nourished in no acute distress.  Skin is warm and dry.  HEENT is normal.  Neck is supple.  Chest is clear  to auscultation with normal expansion.  Cardiovascular exam is regular rate and rhythm.  Abdominal exam nontender or distended. No masses palpated. Extremities show no edema. neuro grossly intact  ECG-AV paced.  Personally reviewed  A/P  1 chronic combined systolic/diastolic congestive heart failure-patient remains euvolemic.  Continue Lasix, Jardiance and spironolactone at present dose.  Check potassium and renal function.  2 cardiomyopathy-LV function with some improvement on most recent echocardiogram.  Possible ischemia mediated versus dyssynchrony secondary to left bundle branch block.  Continue losartan (he did not tolerate Entresto due to hypotension).  Continue carvedilol (increased to 6.25 mg twice daily) and digoxin.  We previously consider cardiac catheterization to further assess his cardiomyopathy but he was frail with multiple comorbidities and I therefore elected to continue medical therapy.  He is much improved with medical therapy and LV function has improved.  We can consider cardiac catheterization in the future if necessary.  3 thoracic aortic aneurysm-given his age and comorbidities I do not think he is a good candidate for consideration of aortic root replacement.  We will therefore not pursue further imaging at this point.  4 pacemaker-managed by electrophysiology.  5 coronary artery disease-he denies chest pain.  Continue aspirin and statin.  6 hypertension-patient's blood pressure is controlled today.  Continue present medical regimen.  7 hyperlipidemia-continue Vytorin.  8 aortic insufficiency-moderate on most recent echocardiogram.  We will likely repeat February 2024.  Kirk Ruths, MD

## 2021-12-16 ENCOUNTER — Ambulatory Visit (INDEPENDENT_AMBULATORY_CARE_PROVIDER_SITE_OTHER): Payer: Medicare Other

## 2021-12-16 DIAGNOSIS — I442 Atrioventricular block, complete: Secondary | ICD-10-CM | POA: Diagnosis not present

## 2021-12-17 DIAGNOSIS — R7303 Prediabetes: Secondary | ICD-10-CM | POA: Diagnosis not present

## 2021-12-17 DIAGNOSIS — H524 Presbyopia: Secondary | ICD-10-CM | POA: Diagnosis not present

## 2021-12-17 DIAGNOSIS — H25813 Combined forms of age-related cataract, bilateral: Secondary | ICD-10-CM | POA: Diagnosis not present

## 2021-12-17 DIAGNOSIS — H5213 Myopia, bilateral: Secondary | ICD-10-CM | POA: Diagnosis not present

## 2021-12-18 ENCOUNTER — Encounter: Payer: Self-pay | Admitting: Cardiology

## 2021-12-18 ENCOUNTER — Ambulatory Visit: Payer: Medicare Other | Admitting: Cardiology

## 2021-12-18 VITALS — BP 124/59 | HR 61 | Ht 69.0 in | Wt 162.6 lb

## 2021-12-18 DIAGNOSIS — I25118 Atherosclerotic heart disease of native coronary artery with other forms of angina pectoris: Secondary | ICD-10-CM | POA: Diagnosis not present

## 2021-12-18 DIAGNOSIS — I5022 Chronic systolic (congestive) heart failure: Secondary | ICD-10-CM

## 2021-12-18 DIAGNOSIS — I7121 Aneurysm of the ascending aorta, without rupture: Secondary | ICD-10-CM

## 2021-12-18 DIAGNOSIS — I2510 Atherosclerotic heart disease of native coronary artery without angina pectoris: Secondary | ICD-10-CM | POA: Diagnosis not present

## 2021-12-18 DIAGNOSIS — I351 Nonrheumatic aortic (valve) insufficiency: Secondary | ICD-10-CM

## 2021-12-18 DIAGNOSIS — I10 Essential (primary) hypertension: Secondary | ICD-10-CM

## 2021-12-18 DIAGNOSIS — Z950 Presence of cardiac pacemaker: Secondary | ICD-10-CM

## 2021-12-18 LAB — BASIC METABOLIC PANEL
BUN/Creatinine Ratio: 21 (ref 10–24)
BUN: 13 mg/dL (ref 8–27)
CO2: 27 mmol/L (ref 20–29)
Calcium: 8.5 mg/dL — ABNORMAL LOW (ref 8.6–10.2)
Chloride: 102 mmol/L (ref 96–106)
Creatinine, Ser: 0.63 mg/dL — ABNORMAL LOW (ref 0.76–1.27)
Glucose: 83 mg/dL (ref 70–99)
Potassium: 4.2 mmol/L (ref 3.5–5.2)
Sodium: 140 mmol/L (ref 134–144)
eGFR: 94 mL/min/{1.73_m2} (ref >59)

## 2021-12-18 LAB — CUP PACEART REMOTE DEVICE CHECK
Battery Impedance: 976 Ohm
Battery Remaining Longevity: 60 mo
Battery Voltage: 2.77 V
Brady Statistic AP VP Percent: 22 %
Brady Statistic AP VS Percent: 0 %
Brady Statistic AS VP Percent: 63 %
Brady Statistic AS VS Percent: 15 %
Date Time Interrogation Session: 20230523100838
Implantable Lead Implant Date: 20160329
Implantable Lead Implant Date: 20160329
Implantable Lead Location: 753859
Implantable Lead Location: 753860
Implantable Lead Model: 5076
Implantable Lead Model: 5092
Implantable Pulse Generator Implant Date: 20160329
Lead Channel Impedance Value: 414 Ohm
Lead Channel Impedance Value: 563 Ohm
Lead Channel Pacing Threshold Amplitude: 0.625 V
Lead Channel Pacing Threshold Amplitude: 1 V
Lead Channel Pacing Threshold Pulse Width: 0.4 ms
Lead Channel Pacing Threshold Pulse Width: 0.4 ms
Lead Channel Setting Pacing Amplitude: 2 V
Lead Channel Setting Pacing Amplitude: 2.5 V
Lead Channel Setting Pacing Pulse Width: 0.4 ms
Lead Channel Setting Sensing Sensitivity: 2.8 mV

## 2021-12-18 MED ORDER — CARVEDILOL 6.25 MG PO TABS
6.2500 mg | ORAL_TABLET | Freq: Two times a day (BID) | ORAL | 3 refills | Status: DC
Start: 2021-12-18 — End: 2023-02-09

## 2021-12-18 NOTE — Patient Instructions (Signed)
Medication Instructions:   INCREASE CARVEDILOL TO 6.25 MG TWICE DAILY= 2 OF THE 3.125 MG TABLETS TWICE DAILY  *If you need a refill on your cardiac medications before your next appointment, please call your pharmacy*   Follow-Up: At [facility], you and your health needs are our priority.  As part of our continuing mission to provide you with exceptional heart care, we have created designated Provider Care Teams.  These Care Teams include your primary Cardiologist (physician) and Advanced Practice Providers (APPs -  Physician Assistants and Nurse Practitioners) who all work together to provide you with the care you need, when you need it.  We recommend signing up for the patient portal called "MyChart".  Sign up information is provided on this After Visit Summary.  MyChart is used to connect with patients for Virtual Visits (Telemedicine).  Patients are able to view lab/test results, encounter notes, upcoming appointments, etc.  Non-urgent messages can be sent to your provider as well.   To learn more about what you can do with MyChart, go to [email or web address].    Your next appointment:   6 month(s)  The format for your next appointment:   In Person  Provider:   Kirk Ruths, MD       Important Information About Sugar

## 2021-12-19 ENCOUNTER — Encounter: Payer: Self-pay | Admitting: *Deleted

## 2022-01-01 NOTE — Progress Notes (Signed)
Remote pacemaker transmission.   

## 2022-01-06 DIAGNOSIS — R197 Diarrhea, unspecified: Secondary | ICD-10-CM | POA: Diagnosis not present

## 2022-01-07 DIAGNOSIS — R197 Diarrhea, unspecified: Secondary | ICD-10-CM | POA: Diagnosis not present

## 2022-01-09 DIAGNOSIS — R197 Diarrhea, unspecified: Secondary | ICD-10-CM | POA: Diagnosis not present

## 2022-01-09 DIAGNOSIS — R918 Other nonspecific abnormal finding of lung field: Secondary | ICD-10-CM | POA: Diagnosis not present

## 2022-01-21 ENCOUNTER — Encounter: Payer: Self-pay | Admitting: Neurology

## 2022-01-21 ENCOUNTER — Ambulatory Visit: Payer: Medicare Other | Admitting: Neurology

## 2022-01-21 VITALS — BP 117/64 | HR 64 | Ht 69.0 in | Wt 147.0 lb

## 2022-01-21 DIAGNOSIS — R197 Diarrhea, unspecified: Secondary | ICD-10-CM

## 2022-01-21 DIAGNOSIS — G7000 Myasthenia gravis without (acute) exacerbation: Secondary | ICD-10-CM

## 2022-01-21 DIAGNOSIS — Z79899 Other long term (current) drug therapy: Secondary | ICD-10-CM

## 2022-01-21 DIAGNOSIS — K529 Noninfective gastroenteritis and colitis, unspecified: Secondary | ICD-10-CM | POA: Diagnosis not present

## 2022-01-21 DIAGNOSIS — H532 Diplopia: Secondary | ICD-10-CM | POA: Diagnosis not present

## 2022-01-21 DIAGNOSIS — R350 Frequency of micturition: Secondary | ICD-10-CM

## 2022-01-21 DIAGNOSIS — R269 Unspecified abnormalities of gait and mobility: Secondary | ICD-10-CM

## 2022-01-22 ENCOUNTER — Other Ambulatory Visit: Payer: Self-pay | Admitting: Neurology

## 2022-01-23 DIAGNOSIS — R197 Diarrhea, unspecified: Secondary | ICD-10-CM | POA: Diagnosis not present

## 2022-01-23 DIAGNOSIS — R918 Other nonspecific abnormal finding of lung field: Secondary | ICD-10-CM | POA: Diagnosis not present

## 2022-02-02 IMAGING — DX DG CHEST 1V PORT
1 series · 1 of 1 positions shown · non-contrast
Comparison: 11/26/2020

CLINICAL DATA: Congestive heart failure

EXAM:
PORTABLE CHEST 1 VIEW

[chest]
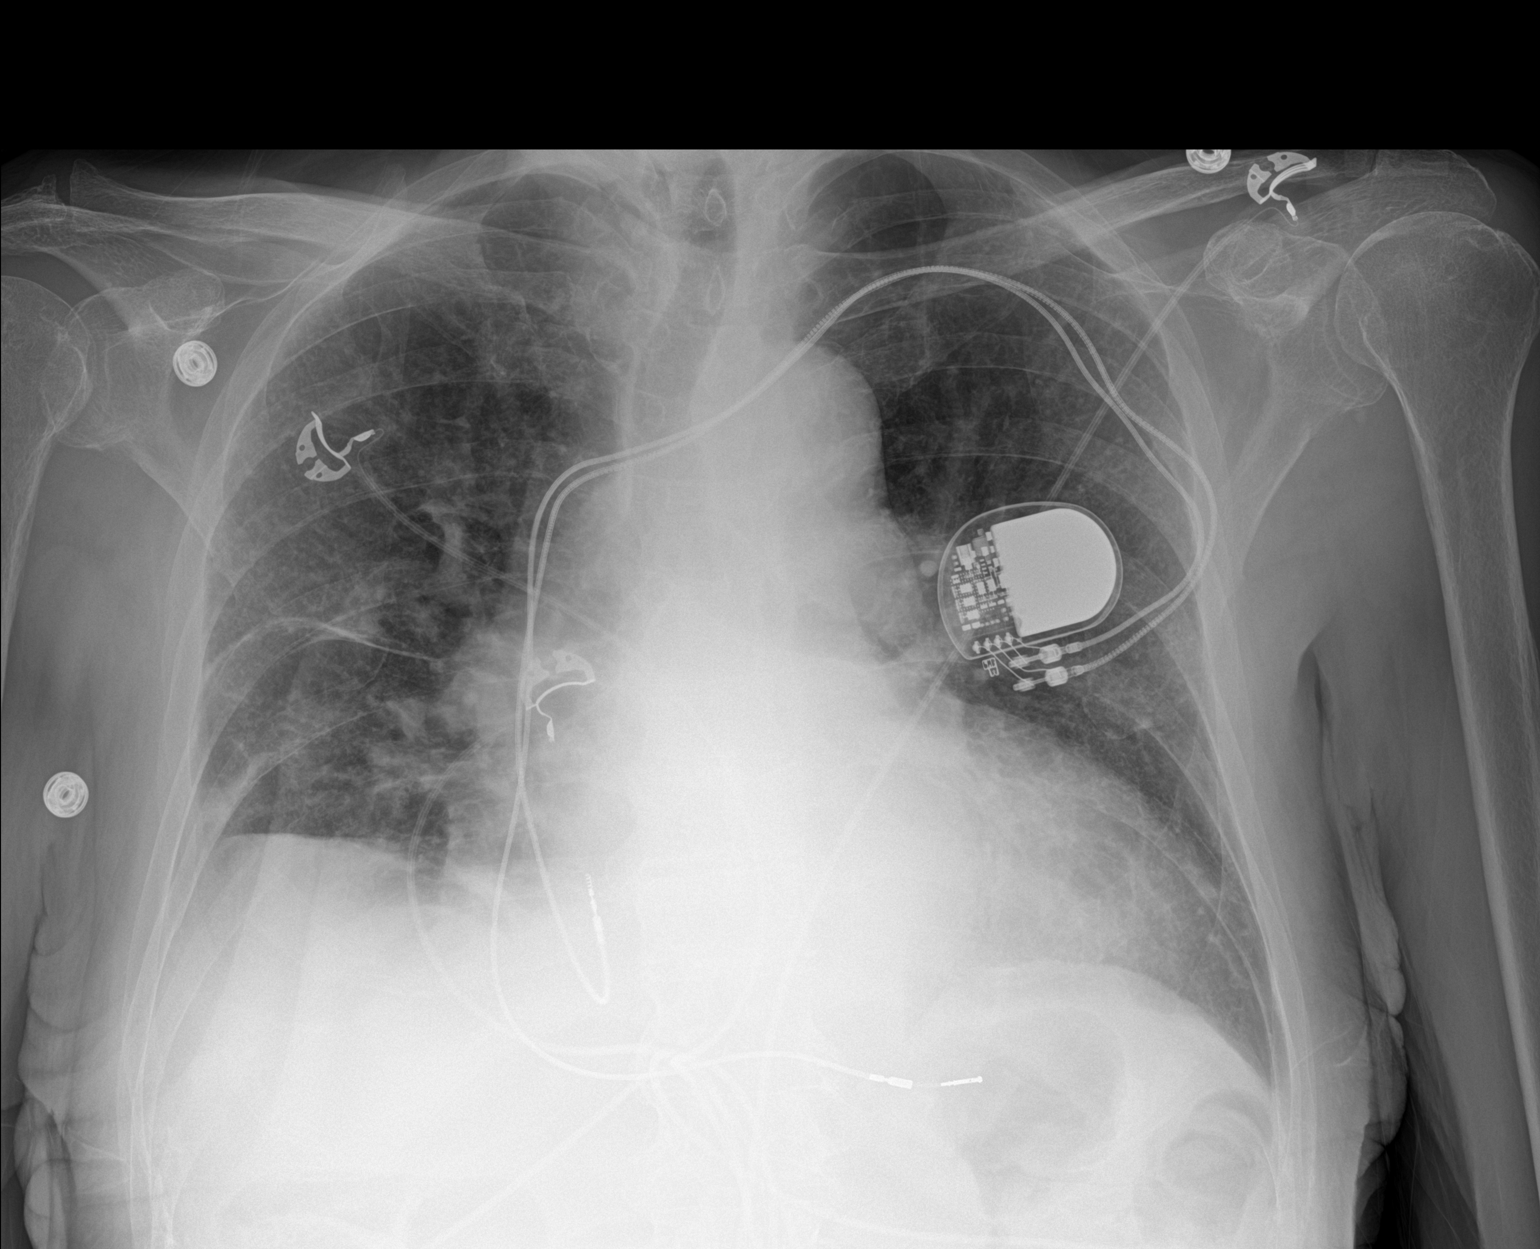

[1 of 1 positions shown; findings below may reference images not displayed]

FINDINGS: Cardiac enlargement. Dual lead pacemaker unchanged. Negative for
heart failure or edema.

Mild elevation right hemidiaphragm unchanged with mild right lower
lobe atelectasis. No pleural effusion identified.
IMPRESSION: Negative for heart failure. Mild right lower lobe atelectasis
unchanged.

## 2022-02-04 DIAGNOSIS — L570 Actinic keratosis: Secondary | ICD-10-CM | POA: Diagnosis not present

## 2022-02-04 DIAGNOSIS — L853 Xerosis cutis: Secondary | ICD-10-CM | POA: Diagnosis not present

## 2022-02-04 DIAGNOSIS — L821 Other seborrheic keratosis: Secondary | ICD-10-CM | POA: Diagnosis not present

## 2022-02-04 DIAGNOSIS — Z85828 Personal history of other malignant neoplasm of skin: Secondary | ICD-10-CM | POA: Diagnosis not present

## 2022-02-13 ENCOUNTER — Other Ambulatory Visit: Payer: Self-pay | Admitting: Cardiology

## 2022-02-19 ENCOUNTER — Other Ambulatory Visit: Payer: Self-pay | Admitting: Cardiology

## 2022-02-26 DIAGNOSIS — M810 Age-related osteoporosis without current pathological fracture: Secondary | ICD-10-CM | POA: Diagnosis not present

## 2022-02-26 DIAGNOSIS — D509 Iron deficiency anemia, unspecified: Secondary | ICD-10-CM | POA: Diagnosis not present

## 2022-02-27 DIAGNOSIS — E7800 Pure hypercholesterolemia, unspecified: Secondary | ICD-10-CM | POA: Diagnosis not present

## 2022-02-27 DIAGNOSIS — I501 Left ventricular failure: Secondary | ICD-10-CM | POA: Diagnosis not present

## 2022-02-27 DIAGNOSIS — M810 Age-related osteoporosis without current pathological fracture: Secondary | ICD-10-CM | POA: Diagnosis not present

## 2022-02-28 ENCOUNTER — Telehealth: Payer: Self-pay | Admitting: Cardiology

## 2022-02-28 NOTE — Telephone Encounter (Signed)
Spouse reported that losartan was discontinued in Jan. 2023, but it is still on the patient's med list. The May visit 2023 mentioned to continue with losartan. He has not been taking it. BP last week 123/66, P 60. This week BP 90/50 and 95/53, P low 60s. Please clarify if losartan should be removed from patient med list.

## 2022-02-28 NOTE — Telephone Encounter (Signed)
Spouse informed that losartan will be removed from patient's med list and for patient to continue a blood pressure diary. Spouse then reported that patient has had increase in diarrhea since the increase of carvedilol. Patietn see Dr. Eber Jones GI and is getting a work-up.

## 2022-02-28 NOTE — Telephone Encounter (Signed)
New message:   Pt c/o medication issue:  1. Name of Medication: Losartan  2. How are you currently taking this medication (dosage and times per day)?   3. Are you having a reaction (difficulty breathing--STAT)?   4. What is your medication issue? He wants to know if he is supposed to be taking Losartan? He have stopped taking it. His other doctor said it was still in the system as he was taking it

## 2022-03-03 ENCOUNTER — Telehealth: Payer: Self-pay | Admitting: Neurology

## 2022-03-03 NOTE — Telephone Encounter (Signed)
Called pt back. Relayed per Dr. Felecia Shelling, ok for him to take Prolia. He verbalized understanding.

## 2022-03-03 NOTE — Telephone Encounter (Signed)
Pt states he is being prescribed Prolia from Dr Pennie Banter office and before starting this new medication for Bone Density, he's like an ok from Dr Felecia Shelling first, please call.

## 2022-03-05 DIAGNOSIS — R197 Diarrhea, unspecified: Secondary | ICD-10-CM | POA: Diagnosis not present

## 2022-03-05 DIAGNOSIS — I2510 Atherosclerotic heart disease of native coronary artery without angina pectoris: Secondary | ICD-10-CM | POA: Diagnosis not present

## 2022-03-05 DIAGNOSIS — E119 Type 2 diabetes mellitus without complications: Secondary | ICD-10-CM | POA: Diagnosis not present

## 2022-03-05 DIAGNOSIS — I10 Essential (primary) hypertension: Secondary | ICD-10-CM | POA: Diagnosis not present

## 2022-03-05 DIAGNOSIS — E7801 Familial hypercholesterolemia: Secondary | ICD-10-CM | POA: Diagnosis not present

## 2022-03-12 DIAGNOSIS — I10 Essential (primary) hypertension: Secondary | ICD-10-CM | POA: Diagnosis not present

## 2022-03-12 DIAGNOSIS — Z0000 Encounter for general adult medical examination without abnormal findings: Secondary | ICD-10-CM | POA: Diagnosis not present

## 2022-03-12 DIAGNOSIS — I2510 Atherosclerotic heart disease of native coronary artery without angina pectoris: Secondary | ICD-10-CM | POA: Diagnosis not present

## 2022-03-12 DIAGNOSIS — G7000 Myasthenia gravis without (acute) exacerbation: Secondary | ICD-10-CM | POA: Diagnosis not present

## 2022-03-12 DIAGNOSIS — K529 Noninfective gastroenteritis and colitis, unspecified: Secondary | ICD-10-CM | POA: Diagnosis not present

## 2022-03-14 DIAGNOSIS — I509 Heart failure, unspecified: Secondary | ICD-10-CM | POA: Diagnosis not present

## 2022-03-17 ENCOUNTER — Ambulatory Visit (INDEPENDENT_AMBULATORY_CARE_PROVIDER_SITE_OTHER): Payer: Medicare Other

## 2022-03-17 DIAGNOSIS — I442 Atrioventricular block, complete: Secondary | ICD-10-CM

## 2022-03-18 LAB — CUP PACEART REMOTE DEVICE CHECK
Battery Impedance: 1059 Ohm
Battery Remaining Longevity: 57 mo
Battery Voltage: 2.77 V
Brady Statistic AP VP Percent: 25 %
Brady Statistic AP VS Percent: 0 %
Brady Statistic AS VP Percent: 60 %
Brady Statistic AS VS Percent: 14 %
Date Time Interrogation Session: 20230821094020
Implantable Lead Implant Date: 20160329
Implantable Lead Implant Date: 20160329
Implantable Lead Location: 753859
Implantable Lead Location: 753860
Implantable Lead Model: 5076
Implantable Lead Model: 5092
Implantable Pulse Generator Implant Date: 20160329
Lead Channel Impedance Value: 421 Ohm
Lead Channel Impedance Value: 584 Ohm
Lead Channel Pacing Threshold Amplitude: 0.5 V
Lead Channel Pacing Threshold Amplitude: 1.25 V
Lead Channel Pacing Threshold Pulse Width: 0.4 ms
Lead Channel Pacing Threshold Pulse Width: 0.4 ms
Lead Channel Setting Pacing Amplitude: 2 V
Lead Channel Setting Pacing Amplitude: 2.5 V
Lead Channel Setting Pacing Pulse Width: 0.4 ms
Lead Channel Setting Sensing Sensitivity: 2.8 mV

## 2022-03-21 DIAGNOSIS — L739 Follicular disorder, unspecified: Secondary | ICD-10-CM | POA: Diagnosis not present

## 2022-03-21 DIAGNOSIS — R197 Diarrhea, unspecified: Secondary | ICD-10-CM | POA: Diagnosis not present

## 2022-04-04 ENCOUNTER — Telehealth: Payer: Self-pay | Admitting: Cardiology

## 2022-04-04 MED ORDER — SPIRONOLACTONE 25 MG PO TABS: ORAL_TABLET | ORAL | 8 refills | Status: DC

## 2022-04-04 NOTE — Telephone Encounter (Signed)
Pharmacist with [facility] following up. Again, stresses patient will be going out of town this afternoon and is completely out of medication. Please assist with request below.

## 2022-04-04 NOTE — Telephone Encounter (Signed)
*  STAT* If patient is at the pharmacy, call can be transferred to refill team.   1. Which medications need to be refilled? (please list name of each medication and dose if known) spironolactone (ALDACTONE) 25 MG tablet  2. Which pharmacy/location (including street and city if local pharmacy) is medication to be sent to? [facility] [facility], [place] - [address] AT [place]  3. Do they need a 30 day or 90 day supply? 90   Pt is out of this medication and is going out of town this afternoon. He states he has been going back and forth with his pharmacy in regards to this and they say they've sent in a refill request.

## 2022-04-07 DIAGNOSIS — M810 Age-related osteoporosis without current pathological fracture: Secondary | ICD-10-CM | POA: Diagnosis not present

## 2022-04-11 NOTE — Progress Notes (Signed)
Remote pacemaker transmission.   

## 2022-04-17 DIAGNOSIS — E7800 Pure hypercholesterolemia, unspecified: Secondary | ICD-10-CM | POA: Diagnosis not present

## 2022-04-17 DIAGNOSIS — M810 Age-related osteoporosis without current pathological fracture: Secondary | ICD-10-CM | POA: Diagnosis not present

## 2022-04-17 DIAGNOSIS — K529 Noninfective gastroenteritis and colitis, unspecified: Secondary | ICD-10-CM | POA: Diagnosis not present

## 2022-05-04 ENCOUNTER — Other Ambulatory Visit: Payer: Self-pay | Admitting: Neurology

## 2022-05-30 DIAGNOSIS — B351 Tinea unguium: Secondary | ICD-10-CM | POA: Diagnosis not present

## 2022-05-30 DIAGNOSIS — I739 Peripheral vascular disease, unspecified: Secondary | ICD-10-CM | POA: Diagnosis not present

## 2022-05-30 DIAGNOSIS — M792 Neuralgia and neuritis, unspecified: Secondary | ICD-10-CM | POA: Diagnosis not present

## 2022-06-03 NOTE — Progress Notes (Signed)
HPI:  Follow-up coronary artery disease, CHF, hypertension and history of complete heart block status post pacemaker. Patient had PCI of his LAD in 2003.  He had a pacemaker placed in 2016.  Echocardiogram March 2016 showed normal LV function, moderate left ventricular hypertrophy, grade 1 diastolic dysfunction, mild aortic insufficiency, mildly dilated aortic root, moderate tricuspid regurgitation.  Nuclear study September 2016 showed ejection fraction 43%, inferior thinning but no ischemia.  Echocardiogram repeated in August 2022 and showed ejection fraction 20 to 25%, mild left ventricular enlargement, mild left ventricular hypertrophy, grade 1 diastolic dysfunction, mild right ventricular enlargement, moderate left atrial enlargement, severe right atrial enlargement, mild mitral regurgitation, moderate tricuspid regurgitation, moderate to severe aortic insufficiency, dilated ascending aorta measuring 45 mm.  At previous office visit patient was volume overloaded and admitted for IV diuresis and medication adjustment.  Patient was noted to be anemic; placed on PPI and GI recommended no further evaluation.  Plan initially was for cardiac catheterization given reduced LV function but given age and comorbidities medical therapy was pursued.  Following discharge he was readmitted with low blood pressure and Entresto was discontinued. CTA October 2022 showed 44 mm ascending thoracic aortic aneurysm.  Echocardiogram February 2023 showed ejection fraction 40 to 45%, mild left ventricular hypertrophy, grade 1 diastolic dysfunction, mild left atrial enlargement, moderate aortic insufficiency, mildly dilated aortic root at 45 mm, moderate dilatation of ascending aorta at 48 mm.  Since last seen he denies dyspnea, chest pain, palpitations or syncope.  Current Outpatient Medications  Medication Sig Dispense Refill   aspirin EC 81 MG tablet Take 81 mg by mouth daily.      carvedilol (COREG) 6.25 MG tablet Take 1  tablet (6.25 mg total) by mouth 2 (two) times daily. 180 tablet 3   Cholecalciferol (VITAMIN D3) 50 MCG (2000 UT) CAPS Take 1 capsule by mouth daily.     desmopressin (DDAVP) 0.1 MG tablet TAKE 1 TABLET(100 MCG) BY MOUTH AT BEDTIME 90 tablet 1   digoxin (LANOXIN) 0.125 MG tablet TAKE 1 TABLET(0.125 MG) BY MOUTH DAILY 90 tablet 2   empagliflozin (JARDIANCE) 10 MG TABS tablet TAKE 1 TABLET BY MOUTH EVERY DAY 90 tablet 3   ezetimibe-simvastatin (VYTORIN) 10-40 MG tablet Take 1 tablet by mouth daily.     furosemide (LASIX) 20 MG tablet Take 1 tablet (20 mg total) by mouth as needed for edema (for weight gain of 2-3lb and ankle edema). 30 tablet 11   loratadine (CLARITIN) 10 MG tablet Take 10 mg by mouth daily.     mycophenolate (CELLCEPT) 250 MG capsule Take 1 capsule (250 mg total) by mouth 2 (two) times daily. TAKE 2 CAPSULES BY MOUTH TWICE DAILY 180 capsule 1   MYRBETRIQ 50 MG TB24 tablet Take 50 mg by mouth daily.     nitroGLYCERIN (NITROSTAT) 0.4 MG SL tablet Place 1 tablet (0.4 mg total) under the tongue every 5 (five) minutes as needed for chest pain. 25 tablet 5   Polyethyl Glycol-Propyl Glycol (SYSTANE) 0.4-0.3 % SOLN Place 1 drop into both eyes daily as needed (dry eyes).     pyridostigmine (MESTINON) 60 MG tablet Take 2 tablets by mouth four times daily (Patient taking differently: Take 120 mg by mouth every 4 (four) hours while awake.) 720 tablet 3   spironolactone (ALDACTONE) 25 MG tablet TAKE 1 TABLET(25 MG) BY MOUTH DAILY 30 tablet 8   tamsulosin (FLOMAX) 0.4 MG CAPS capsule Take 1 capsule (0.4 mg total) by mouth daily. [place]  capsule 2   No current facility-administered medications for this visit.     Past Medical History:  Diagnosis Date   Aortic insufficiency    Ascending aorta dilation (HCC)    Balance problem    CAD (coronary artery disease)    previous stent to LAD 2003   Diabetes mellitus without complication (HCC)    No longer taking diabetic medications at this time   GI  bleeding    HFrEF (heart failure with reduced ejection fraction) ([place])    History of complete heart block    History of kidney stones    Hyperlipidemia    Hypertension    Iron deficiency anemia    LBBB (left bundle branch block)    Melanoma (HCC)    Myasthenia gravis (HCC)    eyelids, finger, and toes   NSVT (nonsustained ventricular tachycardia) (HCC)    OSA (obstructive sleep apnea)    Does not wear CPAP.   Presence of permanent cardiac pacemaker    S/P cardiac pacemaker procedure, placement 10/24/14 Medtronic Adapta L model ADDRL 1  10/25/2014   Spinal stenosis    Severe   Tricuspid regurgitation     Past Surgical History:  Procedure Laterality Date   COLONOSCOPY     CORONARY ANGIOPLASTY WITH STENT PLACEMENT  03/01/2002   stenting to LAD - Dr. Glade Lloyd   CYSTOSCOPY/URETEROSCOPY/HOLMIUM LASER/STENT PLACEMENT Left 08/31/2019   Procedure: CYSTOSCOPY/RETROGRADE/STENT PLACEMENT;  Surgeon: Ceasar Mons, MD;  Location: [facility];  Service: Urology;  Laterality: Left;  ONY NEEDS 45 MIN   NM MYOCAR PERF WALL MOTION  11/27/2010   normal   PERMANENT PACEMAKER INSERTION N/A 10/24/2014   Procedure: PERMANENT PACEMAKER INSERTION;  Surgeon: [name], MD;  Location[facility] C[facility] LAB;  Service: Cardiovascular;  Laterality: N/A;   TONSILLECTOMY     TRANSURETHRAL RESECTION OF PROSTATE N/A 07/06/2019   Procedure: TRANSURETHRAL RESECTION OF THE PROSTATE (TURP), BIPOLAR;  Surgeon: Ceasar Mons, MD;  Location: [facility];  Service: Urology;  Laterality: N/A;   US ECHOCARDIOGRAPHY  12/05/2010   proximal septal thickening,mild concentric LVH,mild deptal hypokinesis,RV mildly dilated,LA mildly dilated,mild AI,moderate aortic root dilatation,septal motion c/w conduction abnormality    Social History   Socioeconomic History   Marital status: Married    Spouse name: Not on file   Number of children: Not on file   Years of education: Not on file    Highest education level: Not on file  Occupational History   Not on file  Tobacco Use   Smoking status: Former    Types: Cigarettes, Cigars    Start date: 04/28/1949    Quit date: 10/27/1982    Years since quitting: 39.6   Smokeless tobacco: Never  Vaping Use   Vaping Use: Never used  Substance and Sexual Activity   Alcohol use: Not Currently    Comment: 1-2 drink per week   Drug use: No   Sexual activity: Not on file  Other Topics Concern   Not on file  Social History Narrative   Not on file   Social Determinants of Health   Financial Resource Strain: Not on file  Food Insecurity: Not on file  Transportation Needs: Not on file  Physical Activity: Not on file  Stress: Not on file  Social Connections: Not on file  Intimate Partner Violence: Not on file    Family History  Problem Relation Age of Onset   Breast cancer Mother    Colon cancer Father  Lung cancer Father     ROS: no fevers or chills, productive cough, hemoptysis, dysphasia, odynophagia, melena, hematochezia, dysuria, hematuria, rash, seizure activity, orthopnea, PND, pedal edema, claudication. Remaining systems are negative.  Physical Exam: Well-developed well-nourished in no acute distress.  Skin is warm and dry.  HEENT is normal.  Neck is supple.  Chest is clear to auscultation with normal expansion.  Cardiovascular exam is regular rate and rhythm.  Abdominal exam nontender or distended. No masses palpated. Extremities show no edema. neuro grossly intact  A/P  1 chronic combined systolic/diastolic congestive heart failure-continue present dose of medications including Lasix, spironolactone and Jardiance.  Check potassium and renal function.  2 cardiomyopathy-some improvement on most recent echocardiogram.  This is felt possibly secondary to ischemia versus dyssynchrony related to left bundle branch block.  Resume losartan 25 mg daily to see if blood pressure will tolerate (he did not tolerate  Entresto secondary to hypotension), and continue carvedilol and digoxin.  I had previously consider cardiac catheterization but given his frail body habitus and multiple comorbidities we elected to treat medically.  He has made significant improvement but is having no symptoms.  Will plan medical therapy at this point.  3 thoracic aortic aneurysm-given his age and comorbidities I do not think he is a good candidate for aortic root replacement.  Will plan to reassess with echocardiogram  4 pacemaker-followed by electrophysiology  5 coronary artery disease-patient denies chest pain.  Continue medical therapy with aspirin and statin.  6 hyperlipidemia-continue Vytorin at present dose.  7 aortic insufficiency-plan follow-up echocardiogram February 2024.  8 hypertension-patient's blood pressure is controlled.  Continue present medical regimen.    Kirk Ruths, MD

## 2022-06-17 ENCOUNTER — Ambulatory Visit: Payer: Medicare Other | Attending: Cardiology | Admitting: Cardiology

## 2022-06-17 ENCOUNTER — Encounter: Payer: Self-pay | Admitting: Cardiology

## 2022-06-17 VITALS — BP 120/66 | HR 60 | Ht 69.0 in | Wt 161.6 lb

## 2022-06-17 DIAGNOSIS — I5022 Chronic systolic (congestive) heart failure: Secondary | ICD-10-CM

## 2022-06-17 DIAGNOSIS — I7121 Aneurysm of the ascending aorta, without rupture: Secondary | ICD-10-CM

## 2022-06-17 DIAGNOSIS — I25118 Atherosclerotic heart disease of native coronary artery with other forms of angina pectoris: Secondary | ICD-10-CM

## 2022-06-17 DIAGNOSIS — I10 Essential (primary) hypertension: Secondary | ICD-10-CM

## 2022-06-17 DIAGNOSIS — I351 Nonrheumatic aortic (valve) insufficiency: Secondary | ICD-10-CM

## 2022-06-17 MED ORDER — LOSARTAN POTASSIUM 25 MG PO TABS: 25.0000 mg | ORAL_TABLET | Freq: Every day | ORAL | 3 refills | Status: DC

## 2022-06-17 NOTE — Patient Instructions (Signed)
Medication Instructions:   START LOSARTAN 25 MG ONCE DAILY  *If you need a refill on your cardiac medications before your next appointment, please call your pharmacy*   Lab Work:  Your physician recommends that you return for lab work in: ONE WEEK-DO NOT NEED TO FAST  If you have labs (blood work) drawn today and your tests are completely normal, you will receive your results only by: [place] (if you have MyChart) OR A paper copy in the mail If you have any lab test that is abnormal or we need to change your treatment, we will call you to review the results.   Testing/Procedures:  Your physician has requested that you have an echocardiogram. Echocardiography is a painless test that uses sound waves to create images of your heart. It provides your doctor with information about the size and shape of your heart and how well your heart's chambers and valves are working. This procedure takes approximately one hour. There are no restrictions for this procedure. Please do NOT wear cologne, perfume, aftershave, or lotions (deodorant is allowed). Please arrive 15 minutes prior to your appointment time. [place]    Follow-Up: At [facility], you and your health needs are our priority.  As part of our continuing mission to provide you with exceptional heart care, we have created designated Provider Care Teams.  These Care Teams include your primary Cardiologist (physician) and Advanced Practice Providers (APPs -  Physician Assistants and Nurse Practitioners) who all work together to provide you with the care you need, when you need it.  We recommend signing up for the patient portal called "MyChart".  Sign up information is provided on this After Visit Summary.  MyChart is used to connect with patients for Virtual Visits (Telemedicine).  Patients are able to view lab/test results, encounter notes, upcoming appointments, etc.  Non-urgent  messages can be sent to your provider as well.   To learn more about what you can do with MyChart, go to [email or web address].    Your next appointment:   6 month(s)  The format for your next appointment:   In Person  Provider:   Kirk Ruths, MD

## 2022-06-24 ENCOUNTER — Ambulatory Visit (INDEPENDENT_AMBULATORY_CARE_PROVIDER_SITE_OTHER): Payer: Medicare Other

## 2022-06-24 DIAGNOSIS — I442 Atrioventricular block, complete: Secondary | ICD-10-CM | POA: Diagnosis not present

## 2022-06-25 LAB — CUP PACEART REMOTE DEVICE CHECK
Battery Impedance: 1163 Ohm
Battery Remaining Longevity: 54 mo
Battery Voltage: 2.76 V
Brady Statistic AP VP Percent: 28 %
Brady Statistic AP VS Percent: 0 %
Brady Statistic AS VP Percent: 59 %
Brady Statistic AS VS Percent: 13 %
Date Time Interrogation Session: 20231128155822
Implantable Lead Connection Status: 753985
Implantable Lead Connection Status: 753985
Implantable Lead Implant Date: 20160329
Implantable Lead Implant Date: 20160329
Implantable Lead Location: 753859
Implantable Lead Location: 753860
Implantable Lead Model: 5076
Implantable Lead Model: 5092
Implantable Pulse Generator Implant Date: 20160329
Lead Channel Impedance Value: 409 Ohm
Lead Channel Impedance Value: 588 Ohm
Lead Channel Pacing Threshold Amplitude: 0.5 V
Lead Channel Pacing Threshold Amplitude: 0.75 V
Lead Channel Pacing Threshold Pulse Width: 0.4 ms
Lead Channel Pacing Threshold Pulse Width: 0.4 ms
Lead Channel Setting Pacing Amplitude: 2 V
Lead Channel Setting Pacing Amplitude: 2.5 V
Lead Channel Setting Pacing Pulse Width: 0.4 ms
Lead Channel Setting Sensing Sensitivity: 2.8 mV
Zone Setting Status: 755011
Zone Setting Status: 755011

## 2022-06-27 DIAGNOSIS — I5022 Chronic systolic (congestive) heart failure: Secondary | ICD-10-CM | POA: Diagnosis not present

## 2022-06-27 LAB — BASIC METABOLIC PANEL
BUN/Creatinine Ratio: 21 (ref 10–24)
BUN: 14 mg/dL (ref 8–27)
CO2: 27 mmol/L (ref 20–29)
Calcium: 9.1 mg/dL (ref 8.6–10.2)
Chloride: 103 mmol/L (ref 96–106)
Creatinine, Ser: 0.67 mg/dL — ABNORMAL LOW (ref 0.76–1.27)
Glucose: 89 mg/dL (ref 70–99)
Potassium: 4.3 mmol/L (ref 3.5–5.2)
Sodium: 141 mmol/L (ref 134–144)
eGFR: 92 mL/min/{1.73_m2} (ref >59)

## 2022-06-30 ENCOUNTER — Encounter: Payer: Self-pay | Admitting: *Deleted

## 2022-07-04 ENCOUNTER — Other Ambulatory Visit: Payer: Self-pay | Admitting: Neurology

## 2022-07-08 ENCOUNTER — Other Ambulatory Visit (HOSPITAL_COMMUNITY): Payer: Self-pay

## 2022-07-08 ENCOUNTER — Telehealth: Payer: Self-pay | Admitting: *Deleted

## 2022-07-08 NOTE — Telephone Encounter (Signed)
Submitted PA on covermymeds. Key: [id number]. Waiting on determination from [place] Medicare Part D.

## 2022-07-08 NOTE — Telephone Encounter (Signed)
[employer] mycophenolate (CELLCEPT) 250 MG capsule has been approved for 07/08/22-07/09/23

## 2022-07-08 NOTE — Telephone Encounter (Signed)
Noted  

## 2022-07-15 ENCOUNTER — Other Ambulatory Visit: Payer: Self-pay | Admitting: Neurology

## 2022-07-23 NOTE — Progress Notes (Signed)
Remote pacemaker transmission.   

## 2022-07-29 ENCOUNTER — Telehealth: Payer: Self-pay

## 2022-07-29 NOTE — Patient Outreach (Signed)
  Care Coordination   Initial Visit Note   07/29/2022 Name: David [name]. MRN: [id number] DOB: 04-Sep-1935  David [name]. is a 87 y.o. year old male who sees [name], MD for primary care. I spoke with  Rochelle L Overton Mam. by phone today.  What matters to the patients health and wellness today?  none    Goals Addressed             This Visit's Progress    COMPLETED: Care Coordination Activities-No follow up required       Care Coordination Interventions: Advised patient to schedule annual exam with physician Advised to schedule annual eye exam Discussed [facility] services and support. Assessed SDOH. Advised to discuss with primary care physician if services needed in the future.        SDOH assessments and interventions completed:  Yes  SDOH Interventions Today    Flowsheet Row Most Recent Value  SDOH Interventions   Transportation Interventions Intervention Not Indicated  Utilities Interventions Intervention Not Indicated        Care Coordination Interventions:  Yes, provided   Follow up plan: No further intervention required.   Encounter Outcome:  Pt. Visit Completed   Jone Baseman, RN, MSN [place] [facility] Care Management Coordinator Direct Line [phone]

## 2022-07-29 NOTE — Patient Instructions (Signed)
Visit Information  Thank you for taking time to visit with me today. Please don't hesitate to contact me if I can be of assistance to you.   Following are the goals we discussed today:   Goals Addressed             This Visit's Progress    COMPLETED: Care Coordination Activities-No follow up required       Care Coordination Interventions: Advised patient to schedule annual exam with physician Advised to schedule annual eye exam Discussed [facility] services and support. Assessed SDOH. Advised to discuss with primary care physician if services needed in the future.           If you are experiencing a Mental Health or [place] or need someone to talk to, please call the Suicide and Crisis Lifeline: 988   Patient verbalizes understanding of instructions and care plan provided today and agrees to view in [place]. Active MyChart status and patient understanding of how to access instructions and care plan via MyChart confirmed with patient.     No further follow up required:    Jone Baseman, RN, MSN [place] [facility] Care Management Coordinator Direct Line [phone]

## 2022-08-06 ENCOUNTER — Encounter: Payer: Self-pay | Admitting: Neurology

## 2022-08-06 ENCOUNTER — Ambulatory Visit (INDEPENDENT_AMBULATORY_CARE_PROVIDER_SITE_OTHER): Payer: Medicare Other | Admitting: Neurology

## 2022-08-06 VITALS — BP 98/53 | HR 60 | Ht 70.0 in | Wt 160.5 lb

## 2022-08-06 DIAGNOSIS — G7000 Myasthenia gravis without (acute) exacerbation: Secondary | ICD-10-CM | POA: Diagnosis not present

## 2022-08-06 DIAGNOSIS — Z79899 Other long term (current) drug therapy: Secondary | ICD-10-CM | POA: Diagnosis not present

## 2022-08-06 DIAGNOSIS — R350 Frequency of micturition: Secondary | ICD-10-CM | POA: Diagnosis not present

## 2022-08-06 DIAGNOSIS — R269 Unspecified abnormalities of gait and mobility: Secondary | ICD-10-CM

## 2022-08-06 NOTE — Progress Notes (Signed)
[facility]  PATIENT: David [name]. DOB: 1936/05/28  REFERRING DOCTOR OR PCP:  [name] (PCP) and Rutherford Guys (Ophtho) SOURCE: Patient, records from Dr. Shelia Media, Lab results, CPAP download  _________________________________   HISTORICAL  CHIEF COMPLAINT:  Chief Complaint  Patient presents with   Room 10    Pt is here with his Wife. Pt states that everything has been going good. Pt states no pain. Pt states he had a fall within the last year before Christmas.     HISTORY OF PRESENT ILLNESS:  David [name]  is a 87 y.o. man with myasthenia gravis, and OSA.     Update 08/06/2022 He feels his MG is doing well.   He takes pyridostigmine 60 mg po qid and mycophenolate 250 mg po bid.     He notes no worsening strength.  No diplopia.  He had blepharoplasty which helped ptosis.    He is feeling worse in general.   He has had diarrhea and also has had a cough the last coupe months.   He has seen GI earlier today.   Imodium was suggested but in past caused constipation.    A stool sample last week was fine and is being repeated   He is sleeping well and stopped trazodone.    The desmopressin has also helped his sleep by reducing the nocturia.  It has helped.  He has nocturia but falls asleep easily after using the toilet.   He lost 75 pounds in last 2 years but has regained 10 pounds this year.  He no longer snores and stopped CPAP.  He is not interested in retesting a PSG.  His back and leg pain improved with his weight loss.  .  He has severe spinal stenosis at L3-L4 and also has a pars defect with anterolisthesis at L5-S1.  He has not needed ESIs recently.  Due to headaches, he takes Advil once or twice a day many days but not [name] days.      He has left shoulder pain.  He tried Voltaren gel once with benefit and heat helps.  He did not want to use more without running it by [place].    MG History:   He had the onset of ptosis, while traveling in [place] and  [place], about 3 weeks ago. He first noted the ptosis while trying to watch a movie in the bus (screen was up high) and noting that the eyelids were covering his vision.  He did note the trip was stressful and he and his wife both had injuries (he sprained ankle and wife broke shoulder).   Lab work was performed and the acetylcholine binding antibodies were elevated at 1.85 (less than 0.24 normal). 57% were blocking antibodies and 25% or modulating antibodies.   Other lab work was noncontributory.     CT scan of the head showed age-related mild chronic microvascular ischemic change.      REVIEW OF SYSTEMS: Constitutional: No fevers, chills, sweats, or change in appetite Eyes: No visual changes, double vision, eye pain.   He notes some ptosis which reduces his visual fields superiorly Ear, nose and throat: No hearing loss, ear pain, nasal congestion, sore throat Cardiovascular: No chest pain, palpitations Respiratory:  No shortness of breath at rest or with exertion.   No wheezes.  He has OSA and is having trouble using CPAP. GastrointestinaI: No nausea, vomiting, diarrhea, abdominal pain, fecal incontinence Genitourinary:  No dysuria, urinary retention or frequency.  No  nocturia. Musculoskeletal:  No neck pain, back pain Integumentary: No rashes. Neurological: as above Psychiatric: No depression at this time.  No anxiety Endocrine: No palpitations, diaphoresis, change in appetite, change in weigh or increased thirst Hematologic/Lymphatic:  No anemia, purpura, petechiae. Allergic/Immunologic: No itchy/runny eyes, nasal congestion, recent allergic reactions, rashes  ALLERGIES: Allergies  Allergen Reactions   Azithromycin Other (See Comments)   Clindamycin Hcl Other (See Comments)   Doxycycline Diarrhea   Doxycycline Hyclate Other (See Comments)   Levofloxacin Other (See Comments)   Niacin Other (See Comments)   Gris-Peg [Griseofulvin] Other (See Comments)    headaches   Terbinafine Other  (See Comments)    headache    HOME MEDICATIONS:  Current Outpatient Medications:    aspirin EC 81 MG tablet, Take 81 mg by mouth daily. , Disp: , Rfl:    carvedilol (COREG) 6.25 MG tablet, Take 1 tablet (6.25 mg total) by mouth 2 (two) times daily., Disp: 180 tablet, Rfl: 3   Cholecalciferol (VITAMIN D3) 50 MCG (2000 UT) CAPS, Take 1 capsule by mouth daily., Disp: , Rfl:    desmopressin (DDAVP) 0.1 MG tablet, TAKE 1 TABLET(100 MCG) BY MOUTH AT BEDTIME, Disp: 90 tablet, Rfl: 1   digoxin (LANOXIN) 0.125 MG tablet, TAKE 1 TABLET(0.125 MG) BY MOUTH DAILY, Disp: 90 tablet, Rfl: 2   empagliflozin (JARDIANCE) 10 MG TABS tablet, TAKE 1 TABLET BY MOUTH EVERY DAY, Disp: 90 tablet, Rfl: 3   ezetimibe-simvastatin (VYTORIN) 10-40 MG tablet, Take 1 tablet by mouth daily., Disp: , Rfl:    loratadine (CLARITIN) 10 MG tablet, Take 10 mg by mouth daily., Disp: , Rfl:    losartan (COZAAR) 25 MG tablet, Take 1 tablet (25 mg total) by mouth daily., Disp: 90 tablet, Rfl: 3   mycophenolate (CELLCEPT) 250 MG capsule, TAKE 1 CAPSULE BY MOUTH 2 TIMES DAILY., Disp: 180 capsule, Rfl: 1   MYRBETRIQ 50 MG TB24 tablet, Take 50 mg by mouth daily., Disp: , Rfl:    Polyethyl Glycol-Propyl Glycol (SYSTANE) 0.4-0.3 % SOLN, Place 1 drop into both eyes daily as needed (dry eyes)., Disp: , Rfl:    pyridostigmine (MESTINON) 60 MG tablet, TAKE 2 TABLET BY MOUTH FOUR TIMES DAILY., Disp: 720 tablet, Rfl: 3   spironolactone (ALDACTONE) 25 MG tablet, TAKE 1 TABLET(25 MG) BY MOUTH DAILY, Disp: 30 tablet, Rfl: 8   tamsulosin (FLOMAX) 0.4 MG CAPS capsule, Take 1 capsule (0.4 mg total) by mouth daily., Disp: 60 capsule, Rfl: 2   furosemide (LASIX) 20 MG tablet, Take 1 tablet (20 mg total) by mouth as needed for edema (for weight gain of 2-3lb and ankle edema)., Disp: 30 tablet, Rfl: 11   nitroGLYCERIN (NITROSTAT) 0.4 MG SL tablet, Place 1 tablet (0.4 mg total) under the tongue every 5 (five) minutes as needed for chest pain. (Patient not  taking: Reported on 08/06/2022), Disp: 25 tablet, Rfl: 5  PAST MEDICAL HISTORY: Past Medical History:  Diagnosis Date   Aortic insufficiency    Ascending aorta dilation (HCC)    Balance problem    CAD (coronary artery disease)    previous stent to LAD 2003   Diabetes mellitus without complication (HCC)    No longer taking diabetic medications at this time   GI bleeding    HFrEF (heart failure with reduced ejection fraction) ([place])    History of complete heart block    History of kidney stones    Hyperlipidemia    Hypertension    Iron deficiency anemia  LBBB (left bundle branch block)    Melanoma (HCC)    Myasthenia gravis (HCC)    eyelids, finger, and toes   NSVT (nonsustained ventricular tachycardia) (HCC)    OSA (obstructive sleep apnea)    Does not wear CPAP.   Presence of permanent cardiac pacemaker    S/P cardiac pacemaker procedure, placement 10/24/14 Medtronic Adapta L model ADDRL 1  10/25/2014   Spinal stenosis    Severe   Tricuspid regurgitation     PAST SURGICAL HISTORY: Past Surgical History:  Procedure Laterality Date   COLONOSCOPY     CORONARY ANGIOPLASTY WITH STENT PLACEMENT  03/01/2002   stenting to LAD - Dr. Glade Lloyd   CYSTOSCOPY/URETEROSCOPY/HOLMIUM LASER/STENT PLACEMENT Left 08/31/2019   Procedure: CYSTOSCOPY/RETROGRADE/STENT PLACEMENT;  Surgeon: Ceasar Mons, MD;  Location: [facility];  Service: Urology;  Laterality: Left;  ONY NEEDS 45 MIN   NM MYOCAR PERF WALL MOTION  11/27/2010   normal   PERMANENT PACEMAKER INSERTION N/A 10/24/2014   Procedure: PERMANENT PACEMAKER INSERTION;  Surgeon: [name], MD;  Location: [facility] CATH LAB;  Service: Cardiovascular;  Laterality: N/A;   TONSILLECTOMY     TRANSURETHRAL RESECTION OF PROSTATE N/A 07/06/2019   Procedure: TRANSURETHRAL RESECTION OF THE PROSTATE (TURP), BIPOLAR;  Surgeon: Ceasar Mons, MD;  Location: [facility];  Service: Urology;  Laterality: N/A;   US  ECHOCARDIOGRAPHY  12/05/2010   proximal septal thickening,mild concentric LVH,mild deptal hypokinesis,RV mildly dilated,LA mildly dilated,mild AI,moderate aortic root dilatation,septal motion c/w conduction abnormality    FAMILY HISTORY: Family History  Problem Relation Age of Onset   Breast cancer Mother    Colon cancer Father    Lung cancer Father     SOCIAL HISTORY:  Social History   Socioeconomic History   Marital status: Married    Spouse name: Not on file   Number of children: Not on file   Years of education: Not on file   Highest education level: Not on file  Occupational History   Not on file  Tobacco Use   Smoking status: Former    Types: Cigarettes, Cigars    Start date: 04/28/1949    Quit date: 10/27/1982    Years since quitting: 39.8   Smokeless tobacco: Never  Vaping Use   Vaping Use: Never used  Substance and Sexual Activity   Alcohol use: Not Currently    Comment: 1-2 drink per week   Drug use: No   Sexual activity: Not on file  Other Topics Concern   Not on file  Social History Narrative   Not on file   Social Determinants of Health   Financial Resource Strain: Not on file  Food Insecurity: Not on file  Transportation Needs: No Transportation Needs (07/29/2022)   PRAPARE - Hydrologist (Medical): No    Lack of Transportation (Non-Medical): No  Physical Activity: Not on file  Stress: Not on file  Social Connections: Not on file  Intimate Partner Violence: Not on file     PHYSICAL EXAM  Vitals:   08/06/22 1143  BP: (!) 98/53  Pulse: 60  Weight: 160 lb 8 oz (72.8 kg)  Height: '5\' 10"'$  (1.778 m)     Body mass index is 23.03 kg/m.     General: The patient is well-developed and well-nourished and in no acute distress   Neurologic Exam  Mental status: The patient is alert and oriented x 3 at the time of the examination. The patient has  apparent normal recent and remote memory, with an apparently normal  attention span and concentration ability.   Speech is normal.  Cranial nerves: Extraocular muscles were intact even with prolonged upgaze. Ptosis is better after surgery   He has slightly reduced neck extension strength.  The voice is strong.. Trapezius is strong.    No obvious hearing deficits are noted.  Motor:  Muscle bulk is normal.   Tone is normal. Strength is  5 / 5 in the arms shoulders and neck.  Strength is 4-/5 in the L5 innervated foot and ankle muscles on the left but 5/5 elsewhere in the left leg.  EHL is 4/5 right and 4+/5 left and he is 5/5 elsewhere on the right.  He is barely able to stand up once without using his arms from a chair but cannot do this twice.  Sensory: He has reduced sensation in the left foot over the L5 dermatome.  Coordination: Cerebellar testing reveals good finger-nose-finger and mildly reduced left heel-to-shin .  Gait and station: Station is normal.  Stride is wide and gait is arthritic.  He can turn 180 degrees in 4  steps.  He has difficulty doing a tandem walk.  Romberg is negative.  Reflexes: Deep tendon reflexes are symmetric and normal in the arms and knees but absent at the ankles    DIAGNOSTIC DATA (LABS, IMAGING, TESTING) - I reviewed patient records, labs, notes, testing and imaging myself where available.  Lab Results  Component Value Date   WBC 5.9 05/08/2021   HGB 13.9 05/08/2021   HCT 42.0 05/08/2021   MCV 90 05/08/2021   PLT 223 05/08/2021      Component Value Date/Time   NA 141 06/27/2022 1142   K 4.3 06/27/2022 1142   CL 103 06/27/2022 1142   CO2 27 06/27/2022 1142   GLUCOSE 89 06/27/2022 1142   GLUCOSE 84 03/22/2021 0321   BUN 14 06/27/2022 1142   CREATININE 0.67 (L) 06/27/2022 1142   CALCIUM 9.1 06/27/2022 1142   PROT 5.7 (L) 08/14/2021 1144   ALBUMIN 3.9 08/14/2021 1144   AST 21 08/14/2021 1144   ALT 14 08/14/2021 1144   ALKPHOS 70 08/14/2021 1144   BILITOT 1.1 08/14/2021 1144   GFRNONAA >60 03/22/2021 0321    GFRAA 105 11/17/2019 1419   Lab Results  Component Value Date   CHOL 111 08/14/2021   HDL 53 08/14/2021   LDLCALC 45 08/14/2021   TRIG 58 08/14/2021   CHOLHDL 2.1 08/14/2021   Lab Results  Component Value Date   HGBA1C 6.6 (H) 10/24/2014   Lab Results  Component Value Date   [id number] 993 05/08/2021   Lab Results  Component Value Date   TSH 1.780 11/20/2020       ASSESSMENT AND PLAN    1. Myasthenia gravis ([place])   2. High risk medication use   3. Gait abnormality   4. Urinary frequency        1.. Mestinon 60 mg po qid and CellCept 250 mg po bid.   If worsens, consider IVIg or other agent and increase Cellcept to 500 mg po bid.     Higher dose of Mestinon caused GI issues 2.  Continue desmopressin for nocturia.     3.   His snoring improved after he lost weight, he probably no longer needs a CPAP.  He prefers not to do another sleep study to determine if he still has OSA. 4.    He will return to see  me in 5-6 months but call sooner if he has new or worsening neurologic symptoms.      Floraine Buechler A. Felecia Shelling, MD, PhD 3/46/2194, 7:12 PM Certified in Neurology, Clinical Neurophysiology, Sleep Medicine, Pain Medicine and Neuroimaging  [facility] [address] [place], [place] [address] [phone]

## 2022-08-07 LAB — COMPREHENSIVE METABOLIC PANEL
ALT: 12 IU/L (ref 0–44)
AST: 18 IU/L (ref 0–40)
Albumin/Globulin Ratio: 1.9 (ref 1.2–2.2)
Albumin: 4.1 g/dL (ref 3.7–4.7)
Alkaline Phosphatase: 49 IU/L (ref 44–121)
BUN/Creatinine Ratio: 34 — ABNORMAL HIGH (ref 10–24)
BUN: 19 mg/dL (ref 8–27)
Bilirubin Total: 0.9 mg/dL (ref 0.0–1.2)
CO2: 27 mmol/L (ref 20–29)
Calcium: 8.7 mg/dL (ref 8.6–10.2)
Chloride: 102 mmol/L (ref 96–106)
Creatinine, Ser: 0.56 mg/dL — ABNORMAL LOW (ref 0.76–1.27)
Globulin, Total: 2.2 g/dL (ref 1.5–4.5)
Glucose: 79 mg/dL (ref 70–99)
Potassium: 4.1 mmol/L (ref 3.5–5.2)
Sodium: 141 mmol/L (ref 134–144)
Total Protein: 6.3 g/dL (ref 6.0–8.5)
eGFR: 97 mL/min/{1.73_m2} (ref >59)

## 2022-08-07 LAB — CBC WITH DIFFERENTIAL/PLATELET
Basophils Absolute: 0 10*3/uL (ref 0.0–0.2)
Basos: 0 %
EOS (ABSOLUTE): 0.1 10*3/uL (ref 0.0–0.4)
Eos: 2 %
Hematocrit: 42.8 % (ref 37.5–51.0)
Hemoglobin: 14.5 g/dL (ref 13.0–17.7)
Immature Grans (Abs): 0 10*3/uL (ref 0.0–0.1)
Immature Granulocytes: 0 %
Lymphocytes Absolute: 1.5 10*3/uL (ref 0.7–3.1)
Lymphs: 27 %
MCH: 31.3 pg (ref 26.6–33.0)
MCHC: 33.9 g/dL (ref 31.5–35.7)
MCV: 92 fL (ref 79–97)
Monocytes Absolute: 0.5 10*3/uL (ref 0.1–0.9)
Monocytes: 9 %
Neutrophils Absolute: 3.5 10*3/uL (ref 1.4–7.0)
Neutrophils: 62 %
Platelets: 205 10*3/uL (ref 150–450)
RBC: 4.63 x10E6/uL (ref 4.14–5.80)
RDW: 11.8 % (ref 11.6–15.4)
WBC: 5.6 10*3/uL (ref 3.4–10.8)

## 2022-08-11 ENCOUNTER — Telehealth: Payer: Self-pay | Admitting: Neurology

## 2022-08-11 MED ORDER — DESMOPRESSIN ACETATE 0.1 MG PO TABS: 0.1000 mg | ORAL_TABLET | Freq: Every day | ORAL | 0 refills | Status: DC

## 2022-08-11 NOTE — Telephone Encounter (Signed)
Pt is calling. Requesting a refill on desmopressin (DDAVP) 0.1 MG tablet. Pt is requesting a 30 day refill. Stated he picked up a 90 day supply and lost it and said hopefully he will find in 30 days. Refill should be sent to [facility] #[id number]

## 2022-08-11 NOTE — Telephone Encounter (Signed)
Noted  

## 2022-08-11 NOTE — Telephone Encounter (Signed)
Pt was called and informed that a refill request was sent in for him to the [facility].

## 2022-08-21 DIAGNOSIS — L821 Other seborrheic keratosis: Secondary | ICD-10-CM | POA: Diagnosis not present

## 2022-08-21 DIAGNOSIS — L570 Actinic keratosis: Secondary | ICD-10-CM | POA: Diagnosis not present

## 2022-08-21 DIAGNOSIS — D0439 Carcinoma in situ of skin of other parts of face: Secondary | ICD-10-CM | POA: Diagnosis not present

## 2022-08-21 DIAGNOSIS — Z85828 Personal history of other malignant neoplasm of skin: Secondary | ICD-10-CM | POA: Diagnosis not present

## 2022-08-21 DIAGNOSIS — Z85820 Personal history of malignant melanoma of skin: Secondary | ICD-10-CM | POA: Diagnosis not present

## 2022-08-27 DIAGNOSIS — B351 Tinea unguium: Secondary | ICD-10-CM | POA: Diagnosis not present

## 2022-08-27 DIAGNOSIS — I739 Peripheral vascular disease, unspecified: Secondary | ICD-10-CM | POA: Diagnosis not present

## 2022-08-31 ENCOUNTER — Other Ambulatory Visit: Payer: Self-pay | Admitting: Neurology

## 2022-09-03 ENCOUNTER — Ambulatory Visit (HOSPITAL_COMMUNITY): Payer: Medicare Other | Attending: Cardiovascular Disease

## 2022-09-03 DIAGNOSIS — I351 Nonrheumatic aortic (valve) insufficiency: Secondary | ICD-10-CM

## 2022-09-03 LAB — ECHOCARDIOGRAM COMPLETE
Area-P 1/2: 5.66 cm2
P 1/2 time: 510 msec
S' Lateral: 4 cm

## 2022-09-05 ENCOUNTER — Encounter: Payer: Self-pay | Admitting: *Deleted

## 2022-10-07 DIAGNOSIS — M810 Age-related osteoporosis without current pathological fracture: Secondary | ICD-10-CM | POA: Diagnosis not present

## 2022-11-06 DIAGNOSIS — B351 Tinea unguium: Secondary | ICD-10-CM | POA: Diagnosis not present

## 2022-11-07 ENCOUNTER — Other Ambulatory Visit: Payer: Self-pay | Admitting: Cardiology

## 2022-11-22 ENCOUNTER — Other Ambulatory Visit: Payer: Self-pay | Admitting: Cardiology

## 2022-12-10 DIAGNOSIS — K080 Exfoliation of teeth due to systemic causes: Secondary | ICD-10-CM | POA: Diagnosis not present

## 2022-12-10 NOTE — Progress Notes (Signed)
HPI:  Follow-up coronary artery disease, CHF, hypertension and history of complete heart block status post pacemaker. Patient had PCI of his LAD in 2003.  He had a pacemaker placed in 2016.  Echocardiogram March 2016 showed normal LV function, moderate left ventricular hypertrophy, grade 1 diastolic dysfunction, mild aortic insufficiency, mildly dilated aortic root, moderate tricuspid regurgitation.  Nuclear study September 2016 showed ejection fraction 43%, inferior thinning but no ischemia.  Echocardiogram repeated in August 2022 and showed ejection fraction 20 to 25%, mild left ventricular enlargement, mild left ventricular hypertrophy, grade 1 diastolic dysfunction, mild right ventricular enlargement, moderate left atrial enlargement, severe right atrial enlargement, mild mitral regurgitation, moderate tricuspid regurgitation, moderate to severe aortic insufficiency, dilated ascending aorta measuring 45 mm.  At previous office visit patient was volume overloaded and admitted for IV diuresis and medication adjustment.  Patient was noted to be anemic; placed on PPI and GI recommended no further evaluation.  Plan initially was for cardiac catheterization given reduced LV function but given age and comorbidities medical therapy was pursued.  Following discharge he was readmitted with low blood pressure and Entresto was discontinued. CTA October 2022 showed 44 mm ascending thoracic aortic aneurysm. Echocardiogram repeated February 2024 and showed ejection fraction 40 to 45%, moderate aortic insufficiency, moderately dilated ascending aorta at 45 mm.  Since last seen the patient denies any dyspnea on exertion, orthopnea, PND, pedal edema, palpitations, syncope or chest pain.   Current Outpatient Medications  Medication Sig Dispense Refill   aspirin EC 81 MG tablet Take 81 mg by mouth daily.      carvedilol (COREG) 6.25 MG tablet Take 1 tablet (6.25 mg total) by mouth 2 (two) times daily. 180 tablet 3    Cholecalciferol (VITAMIN D3) 50 MCG (2000 UT) CAPS Take 1 capsule by mouth daily.     desmopressin (DDAVP) 0.1 MG tablet TAKE 1 TABLET(100 MCG) BY MOUTH AT BEDTIME 90 tablet 1   desmopressin (DDAVP) 0.1 MG tablet TAKE 1 TABLET(0.1 MG) BY MOUTH AT BEDTIME 30 tablet 4   digoxin (LANOXIN) 0.125 MG tablet TAKE 1 TABLET(0.125 MG) BY MOUTH DAILY 90 tablet 2   ezetimibe-simvastatin (VYTORIN) 10-40 MG tablet Take 1 tablet by mouth daily.     furosemide (LASIX) 20 MG tablet Take 1 tablet (20 mg total) by mouth as needed for edema (for weight gain of 2-3lb and ankle edema). 30 tablet 11   JARDIANCE 10 MG TABS tablet TAKE 1 TABLET BY MOUTH EVERY DAY 90 tablet 3   loratadine (CLARITIN) 10 MG tablet Take 10 mg by mouth daily.     losartan (COZAAR) 25 MG tablet Take 1 tablet (25 mg total) by mouth daily. 90 tablet 3   mycophenolate (CELLCEPT) 250 MG capsule TAKE 1 CAPSULE BY MOUTH 2 TIMES DAILY. 180 capsule 1   MYRBETRIQ 50 MG TB24 tablet Take 50 mg by mouth daily.     nitroGLYCERIN (NITROSTAT) 0.4 MG SL tablet Place 1 tablet (0.4 mg total) under the tongue every 5 (five) minutes as needed for chest pain. 25 tablet 5   Polyethyl Glycol-Propyl Glycol (SYSTANE) 0.4-0.3 % SOLN Place 1 drop into both eyes daily as needed (dry eyes).     pyridostigmine (MESTINON) 60 MG tablet TAKE 2 TABLET BY MOUTH FOUR TIMES DAILY. (Patient taking differently: Take 60 mg by mouth 4 (four) times daily. TAKE 1 TABLET BY MOUTH FOUR TIMES DAILY.) 720 tablet 3   spironolactone (ALDACTONE) 25 MG tablet TAKE 1 TABLET(25 MG) BY MOUTH DAILY  30 tablet 8   tamsulosin (FLOMAX) 0.4 MG CAPS capsule Take 1 capsule (0.4 mg total) by mouth daily. 60 capsule 2   No current facility-administered medications for this visit.     Past Medical History:  Diagnosis Date   Aortic insufficiency    Ascending aorta dilation (HCC)    Balance problem    CAD (coronary artery disease)    previous stent to LAD 2003   Diabetes mellitus without complication  (HCC)    No longer taking diabetic medications at this time   GI bleeding    HFrEF (heart failure with reduced ejection fraction) (HCC)    History of complete heart block    History of kidney stones    Hyperlipidemia    Hypertension    Iron deficiency anemia    LBBB (left bundle branch block)    Melanoma (HCC)    Myasthenia gravis (HCC)    eyelids, finger, and toes   NSVT (nonsustained ventricular tachycardia) (HCC)    OSA (obstructive sleep apnea)    Does not wear CPAP.   Presence of permanent cardiac pacemaker    S/P cardiac pacemaker procedure, placement 10/24/14 Medtronic Adapta L model ADDRL 1  10/25/2014   Spinal stenosis    Severe   Tricuspid regurgitation     Past Surgical History:  Procedure Laterality Date   COLONOSCOPY     CORONARY ANGIOPLASTY WITH STENT PLACEMENT  03/01/2002   stenting to LAD - Dr. Aleen Campi   CYSTOSCOPY/URETEROSCOPY/HOLMIUM LASER/STENT PLACEMENT Left 08/31/2019   Procedure: CYSTOSCOPY/RETROGRADE/STENT PLACEMENT;  Surgeon: Rene Paci, MD;  Location: [facility];  Service: Urology;  Laterality: Left;  ONY NEEDS 45 MIN   NM MYOCAR PERF WALL MOTION  11/27/2010   normal   PERMANENT PACEMAKER INSERTION N/A 10/24/2014   Procedure: PERMANENT PACEMAKER INSERTION;  Surgeon: Hillis Range, MD;  Location: [facility] CATH LAB;  Service: Cardiovascular;  Laterality: N/A;   TONSILLECTOMY     TRANSURETHRAL RESECTION OF PROSTATE N/A 07/06/2019   Procedure: TRANSURETHRAL RESECTION OF THE PROSTATE (TURP), BIPOLAR;  Surgeon: Rene Paci, MD;  Location[facility];  Service: Urology;  Laterality: N/A;   US ECHOCARDIOGRAPHY  12/05/2010   proximal septal thickening,mild concentric LVH,mild deptal hypokinesis,RV mildly dilated,LA mildly dilated,mild AI,moderate aortic root dilatation,septal motion c/w conduction abnormality    Social History   Socioeconomic History   Marital status: Married    Spouse name: Not on file    Number of children: Not on file   Years of education: Not on file   Highest education level: Not on file  Occupational History   Not on file  Tobacco Use   Smoking status: Former    Types: Cigarettes, Cigars    Start date: 04/28/1949    Quit date: 10/27/1982    Years since quitting: 40.1   Smokeless tobacco: Never  Vaping Use   Vaping Use: Never used  Substance and Sexual Activity   Alcohol use: Not Currently    Comment: 1-2 drink per week   Drug use: No   Sexual activity: Not on file  Other Topics Concern   Not on file  Social History Narrative   Not on file   Social Determinants of Health   Financial Resource Strain: Not on file  Food Insecurity: Not on file  Transportation Needs: No Transportation Needs (07/29/2022)   PRAPARE - Administrator, Civil Service (Medical): No    Lack of Transportation (Non-Medical): No  Physical Activity: Not on  file  Stress: Not on file  Social Connections: Not on file  Intimate Partner Violence: Not on file    Family History  Problem Relation Age of Onset   Breast cancer Mother    Colon cancer Father    Lung cancer Father     ROS: no fevers or chills, productive cough, hemoptysis, dysphasia, odynophagia, melena, hematochezia, dysuria, hematuria, rash, seizure activity, orthopnea, PND, pedal edema, claudication. Remaining systems are negative.  Physical Exam: Well-developed well-nourished in no acute distress.  Skin is warm and dry.  HEENT is normal.  Neck is supple.  Chest is clear to auscultation with normal expansion.  Cardiovascular exam is regular rate and rhythm.  Abdominal exam nontender or distended. No masses palpated. Extremities show no edema. neuro grossly intact  ECG-AV paced rhythm.  PVC.  Personally reviewed  A/P  1 chronic combined systolic/diastolic congestive heart failure-patient is euvolemic on examination today.  Will continue present medications including Lasix, spironolactone and Jardiance.   Check potassium and renal function.  2 cardiomyopathy-LV function mildly reduced on most recent echocardiogram.  This is felt possibly secondary to dyssynchrony with left bundle branch block versus ischemia mediated.  Will continue low-dose losartan, carvedilol; DC digoxin.  He did not tolerate Entresto previously.  We previously considered cardiac catheterization but given frail body habitus and multiple comorbidities we have elected to treat medically and he has done remarkably well.  3 thoracic aortic aneurysm-unchanged on recent echocardiogram.  Given his age and overall comorbidities I do not think he would be a candidate for aortic root replacement in the future if indicated.  4 coronary artery disease-continue aspirin and statin.  5 aortic insufficiency-we will likely repeat echo February 2025.  6 hypertension-patient's blood pressure is controlled.  Continue present medical regimen.  7 hyperlipidemia-continue Vytorin.  Check lipids and liver.  8 pacemaker-Per EP.  Olga Millers, MD

## 2022-12-16 ENCOUNTER — Other Ambulatory Visit: Payer: Self-pay | Admitting: Neurology

## 2022-12-16 ENCOUNTER — Other Ambulatory Visit: Payer: Self-pay

## 2022-12-18 DIAGNOSIS — H25813 Combined forms of age-related cataract, bilateral: Secondary | ICD-10-CM | POA: Diagnosis not present

## 2022-12-18 DIAGNOSIS — E1136 Type 2 diabetes mellitus with diabetic cataract: Secondary | ICD-10-CM | POA: Diagnosis not present

## 2022-12-18 DIAGNOSIS — Z7984 Long term (current) use of oral hypoglycemic drugs: Secondary | ICD-10-CM | POA: Diagnosis not present

## 2022-12-19 ENCOUNTER — Encounter: Payer: Self-pay | Admitting: Cardiology

## 2022-12-19 ENCOUNTER — Ambulatory Visit: Payer: Medicare Other | Attending: Cardiology | Admitting: Cardiology

## 2022-12-19 VITALS — BP 122/62 | HR 66 | Ht 69.0 in | Wt 168.0 lb

## 2022-12-19 DIAGNOSIS — I25118 Atherosclerotic heart disease of native coronary artery with other forms of angina pectoris: Secondary | ICD-10-CM

## 2022-12-19 DIAGNOSIS — I2510 Atherosclerotic heart disease of native coronary artery without angina pectoris: Secondary | ICD-10-CM

## 2022-12-19 DIAGNOSIS — I7121 Aneurysm of the ascending aorta, without rupture: Secondary | ICD-10-CM

## 2022-12-19 DIAGNOSIS — I10 Essential (primary) hypertension: Secondary | ICD-10-CM

## 2022-12-19 DIAGNOSIS — I5022 Chronic systolic (congestive) heart failure: Secondary | ICD-10-CM

## 2022-12-19 DIAGNOSIS — Z950 Presence of cardiac pacemaker: Secondary | ICD-10-CM

## 2022-12-19 NOTE — Patient Instructions (Signed)
Medication Instructions:   STOP DIGOXIN  *If you need a refill on your cardiac medications before your next appointment, please call your pharmacy*   Lab Work:  Your physician recommends that you return for lab work FASTING  If you have labs (blood work) drawn today and your tests are completely normal, you will receive your results only by: MyChart Message (if you have MyChart) OR A paper copy in the mail If you have any lab test that is abnormal or we need to change your treatment, we will call you to review the results.   Follow-Up: At [facility], you and your health needs are our priority.  As part of our continuing mission to provide you with exceptional heart care, we have created designated Provider Care Teams.  These Care Teams include your primary Cardiologist (physician) and Advanced Practice Providers (APPs -  Physician Assistants and Nurse Practitioners) who all work together to provide you with the care you need, when you need it.  We recommend signing up for the patient portal called "MyChart".  Sign up information is provided on this After Visit Summary.  MyChart is used to connect with patients for Virtual Visits (Telemedicine).  Patients are able to view lab/test results, encounter notes, upcoming appointments, etc.  Non-urgent messages can be sent to your provider as well.   To learn more about what you can do with MyChart, go to [email or web address].    Your next appointment:   6 month(s)  Provider:   Olga Millers, MD

## 2022-12-23 DIAGNOSIS — K080 Exfoliation of teeth due to systemic causes: Secondary | ICD-10-CM | POA: Diagnosis not present

## 2022-12-24 DIAGNOSIS — I5022 Chronic systolic (congestive) heart failure: Secondary | ICD-10-CM | POA: Diagnosis not present

## 2022-12-24 DIAGNOSIS — I2510 Atherosclerotic heart disease of native coronary artery without angina pectoris: Secondary | ICD-10-CM | POA: Diagnosis not present

## 2022-12-25 ENCOUNTER — Encounter: Payer: Self-pay | Admitting: *Deleted

## 2022-12-25 LAB — LIPID PANEL
Chol/HDL Ratio: 2.5 ratio (ref 0.0–5.0)
Cholesterol, Total: 136 mg/dL (ref 100–199)
HDL: 55 mg/dL (ref >39)
LDL Chol Calc (NIH): 67 mg/dL (ref 0–99)
Triglycerides: 69 mg/dL (ref 0–149)
VLDL Cholesterol Cal: 14 mg/dL (ref 5–40)

## 2022-12-25 LAB — COMPREHENSIVE METABOLIC PANEL
ALT: 9 IU/L (ref 0–44)
AST: 16 IU/L (ref 0–40)
Albumin/Globulin Ratio: 1.9 (ref 1.2–2.2)
Albumin: 4.1 g/dL (ref 3.7–4.7)
Alkaline Phosphatase: 52 IU/L (ref 44–121)
BUN/Creatinine Ratio: 30 — ABNORMAL HIGH (ref 10–24)
BUN: 17 mg/dL (ref 8–27)
Bilirubin Total: 0.8 mg/dL (ref 0.0–1.2)
CO2: 26 mmol/L (ref 20–29)
Calcium: 9 mg/dL (ref 8.6–10.2)
Chloride: 104 mmol/L (ref 96–106)
Creatinine, Ser: 0.57 mg/dL — ABNORMAL LOW (ref 0.76–1.27)
Globulin, Total: 2.2 g/dL (ref 1.5–4.5)
Glucose: 100 mg/dL — ABNORMAL HIGH (ref 70–99)
Potassium: 4.1 mmol/L (ref 3.5–5.2)
Sodium: 141 mmol/L (ref 134–144)
Total Protein: 6.3 g/dL (ref 6.0–8.5)
eGFR: 96 mL/min/{1.73_m2} (ref >59)

## 2023-01-13 ENCOUNTER — Other Ambulatory Visit: Payer: Self-pay | Admitting: Neurology

## 2023-01-13 NOTE — Telephone Encounter (Signed)
Last seen on 08/06/22 Follow up scheduled on 02/11/23 Last filled on 12/11/22 #60 tablets (30 day supply)

## 2023-01-15 DIAGNOSIS — R059 Cough, unspecified: Secondary | ICD-10-CM | POA: Diagnosis not present

## 2023-01-15 DIAGNOSIS — J189 Pneumonia, unspecified organism: Secondary | ICD-10-CM | POA: Diagnosis not present

## 2023-01-21 ENCOUNTER — Other Ambulatory Visit: Payer: Self-pay | Admitting: Neurology

## 2023-01-21 NOTE — Telephone Encounter (Signed)
Last seen on 08/06/22 per note "Continue desmopressin for nocturia. " Follow up scheduled on 02/11/23 Last filled on 12/17/22 #30 tablets (30 day supply)

## 2023-02-06 DIAGNOSIS — J189 Pneumonia, unspecified organism: Secondary | ICD-10-CM | POA: Diagnosis not present

## 2023-02-06 DIAGNOSIS — M792 Neuralgia and neuritis, unspecified: Secondary | ICD-10-CM | POA: Diagnosis not present

## 2023-02-06 DIAGNOSIS — R059 Cough, unspecified: Secondary | ICD-10-CM | POA: Diagnosis not present

## 2023-02-08 ENCOUNTER — Other Ambulatory Visit: Payer: Self-pay | Admitting: Cardiology

## 2023-02-08 DIAGNOSIS — I5022 Chronic systolic (congestive) heart failure: Secondary | ICD-10-CM

## 2023-02-10 ENCOUNTER — Other Ambulatory Visit: Payer: Self-pay | Admitting: Cardiology

## 2023-02-11 ENCOUNTER — Encounter: Payer: Self-pay | Admitting: Neurology

## 2023-02-11 ENCOUNTER — Ambulatory Visit: Payer: Medicare Other | Admitting: Neurology

## 2023-02-11 VITALS — BP 86/45 | HR 65 | Ht 69.0 in | Wt 164.5 lb

## 2023-02-11 DIAGNOSIS — R269 Unspecified abnormalities of gait and mobility: Secondary | ICD-10-CM | POA: Diagnosis not present

## 2023-02-11 DIAGNOSIS — H532 Diplopia: Secondary | ICD-10-CM

## 2023-02-11 DIAGNOSIS — G7000 Myasthenia gravis without (acute) exacerbation: Secondary | ICD-10-CM | POA: Diagnosis not present

## 2023-02-11 DIAGNOSIS — R350 Frequency of micturition: Secondary | ICD-10-CM

## 2023-02-11 DIAGNOSIS — Z79899 Other long term (current) drug therapy: Secondary | ICD-10-CM | POA: Diagnosis not present

## 2023-02-11 DIAGNOSIS — G4733 Obstructive sleep apnea (adult) (pediatric): Secondary | ICD-10-CM

## 2023-02-11 NOTE — Progress Notes (Signed)
[facility]  PATIENT: David [name]. DOB: 04/23/1936  REFERRING DOCTOR OR PCP:  Merri Brunette (PCP) and Jethro Bolus (Ophtho) SOURCE: Patient, records from Dr. Renne Crigler, Lab results, CPAP download  _________________________________   HISTORICAL  CHIEF COMPLAINT:  Chief Complaint  Patient presents with   Follow-up    Pt in room 10,wife in room. Here for Myastenia gravis pt reports doing well. Pt has pneumonia on antibiotic.     HISTORY OF PRESENT ILLNESS:  David [name]  is a 87 y.o. man with myasthenia gravis, and OSA.     Update 02/11/2023 He feels his MG is doing well.   He takes pyridostigmine 60 mg po qid and mycophenolate 250 mg po bid.     He notes no worsening strength.  No denies diplopia.  He did have blepharoplasty which helped ptosis.    He was recently diagnosed with community acquired pneumonia and has been treated with amoxicillin iniitally and a cephalosporin currently.   He has no difficulty with dysphagia.     He has soe diarrhea and takes Imodium 2-3 times a week  He recently travelled to [place] and also [place].   He enjoys traveling and going to shows   He is sleeping well and stopped trazodone.    The desmopressin has helped his sleep by reducing the nocturia to just once most nights.     He lost 75 pounds in last 2 years but has regained 10 pounds this year.  He no longer snores and stopped CPAP.  He is not interested in retesting a PSG.  His back and leg pain improved with his weight loss.  .  He has severe spinal stenosis at L3-L4 and also has a pars defect with anterolisthesis at L5-S1.  He has not needed ESIs recently. Also has some shoulder pain    MG History:   He had the onset of ptosis, while traveling in [place] and [place], about 3 weeks ago. He first noted the ptosis while trying to watch a movie in the bus (screen was up high) and noting that the eyelids were covering his vision.  He did note the trip was  stressful and he and his wife both had injuries (he sprained ankle and wife broke shoulder).   Lab work was performed and the acetylcholine binding antibodies were elevated at 1.85 (less than 0.24 normal). 57% were blocking antibodies and 25% or modulating antibodies.   Other lab work was noncontributory.     CT scan of the head showed age-related mild chronic microvascular ischemic change.      REVIEW OF SYSTEMS: Constitutional: No fevers, chills, sweats, or change in appetite Eyes: No visual changes, double vision, eye pain.   He notes some ptosis which reduces his visual fields superiorly Ear, nose and throat: No hearing loss, ear pain, nasal congestion, sore throat Cardiovascular: No chest pain, palpitations Respiratory:  No shortness of breath at rest or with exertion.   No wheezes.  He has OSA and is having trouble using CPAP. GastrointestinaI: No nausea, vomiting, diarrhea, abdominal pain, fecal incontinence Genitourinary:  No dysuria, urinary retention or frequency.  No nocturia. Musculoskeletal:  No neck pain, back pain Integumentary: No rashes. Neurological: as above Psychiatric: No depression at this time.  No anxiety Endocrine: No palpitations, diaphoresis, change in appetite, change in weigh or increased thirst Hematologic/Lymphatic:  No anemia, purpura, petechiae. Allergic/Immunologic: No itchy/runny eyes, nasal congestion, recent allergic reactions, rashes  ALLERGIES: Allergies  Allergen Reactions  Azithromycin Other (See Comments)   Clindamycin Hcl Other (See Comments)   Doxycycline Diarrhea   Doxycycline Hyclate Other (See Comments)   Levofloxacin Other (See Comments)   Niacin Other (See Comments)   Gris-Peg [Griseofulvin] Other (See Comments)    headaches   Terbinafine Other (See Comments)    headache    HOME MEDICATIONS:  Current Outpatient Medications:    aspirin EC 81 MG tablet, Take 81 mg by mouth daily. , Disp: , Rfl:    carvedilol (COREG) 6.25 MG  tablet, TAKE 1 TABLET(6.25 MG) BY MOUTH TWICE DAILY, Disp: 180 tablet, Rfl: 2   Cholecalciferol (VITAMIN D3) 50 MCG (2000 UT) CAPS, Take 1 capsule by mouth daily., Disp: , Rfl:    desmopressin (DDAVP) 0.1 MG tablet, TAKE 1 TABLET(0.1 MG) BY MOUTH AT BEDTIME, Disp: 30 tablet, Rfl: 0   ezetimibe-simvastatin (VYTORIN) 10-40 MG tablet, Take 1 tablet by mouth daily., Disp: , Rfl:    JARDIANCE 10 MG TABS tablet, TAKE 1 TABLET BY MOUTH EVERY DAY, Disp: 90 tablet, Rfl: 3   loratadine (CLARITIN) 10 MG tablet, Take 10 mg by mouth daily., Disp: , Rfl:    losartan (COZAAR) 25 MG tablet, Take 1 tablet (25 mg total) by mouth daily., Disp: 90 tablet, Rfl: 3   mycophenolate (CELLCEPT) 250 MG capsule, TAKE 1 CAPSULE BY MOUTH TWICE DAILY, Disp: 180 capsule, Rfl: 0   MYRBETRIQ 50 MG TB24 tablet, Take 50 mg by mouth daily., Disp: , Rfl:    nitroGLYCERIN (NITROSTAT) 0.4 MG SL tablet, Place 1 tablet (0.4 mg total) under the tongue every 5 (five) minutes as needed for chest pain., Disp: 25 tablet, Rfl: 5   Polyethyl Glycol-Propyl Glycol (SYSTANE) 0.4-0.3 % SOLN, Place 1 drop into both eyes daily as needed (dry eyes)., Disp: , Rfl:    pyridostigmine (MESTINON) 60 MG tablet, TAKE 2 TABLET BY MOUTH FOUR TIMES DAILY. (Patient taking differently: Take 60 mg by mouth 4 (four) times daily. TAKE 1 TABLET BY MOUTH FOUR TIMES DAILY.), Disp: 720 tablet, Rfl: 3   spironolactone (ALDACTONE) 25 MG tablet, TAKE 1 TABLET(25 MG) BY MOUTH DAILY, Disp: 30 tablet, Rfl: 11   tamsulosin (FLOMAX) 0.4 MG CAPS capsule, Take 1 capsule (0.4 mg total) by mouth daily., Disp: 60 capsule, Rfl: 2   desmopressin (DDAVP) 0.1 MG tablet, TAKE 1 TABLET(100 MCG) BY MOUTH AT BEDTIME (Patient not taking: Reported on 02/11/2023), Disp: 90 tablet, Rfl: 1   furosemide (LASIX) 20 MG tablet, Take 1 tablet (20 mg total) by mouth as needed for edema (for weight gain of 2-3lb and ankle edema)., Disp: 30 tablet, Rfl: 11  PAST MEDICAL HISTORY: Past Medical History:   Diagnosis Date   Aortic insufficiency    Ascending aorta dilation (HCC)    Balance problem    CAD (coronary artery disease)    previous stent to LAD 2003   Diabetes mellitus without complication (HCC)    No longer taking diabetic medications at this time   GI bleeding    HFrEF (heart failure with reduced ejection fraction) (HCC)    History of complete heart block    History of kidney stones    Hyperlipidemia    Hypertension    Iron deficiency anemia    LBBB (left bundle branch block)    Melanoma (HCC)    Myasthenia gravis (HCC)    eyelids, finger, and toes   NSVT (nonsustained ventricular tachycardia) (HCC)    OSA (obstructive sleep apnea)    Does not wear CPAP.  Presence of permanent cardiac pacemaker    S/P cardiac pacemaker procedure, placement 10/24/14 Medtronic Adapta L model ADDRL 1  10/25/2014   Spinal stenosis    Severe   Tricuspid regurgitation     PAST SURGICAL HISTORY: Past Surgical History:  Procedure Laterality Date   COLONOSCOPY     CORONARY ANGIOPLASTY WITH STENT PLACEMENT  03/01/2002   stenting to LAD - Dr. Aleen Campi   CYSTOSCOPY/URETEROSCOPY/HOLMIUM LASER/STENT PLACEMENT Left 08/31/2019   Procedure: CYSTOSCOPY/RETROGRADE/STENT PLACEMENT;  Surgeon: Rene Paci, MD;  Location: [facility];  Service: Urology;  Laterality: Left;  ONY NEEDS 45 MIN   NM MYOCAR PERF WALL MOTION  11/27/2010   normal   PERMANENT PACEMAKER INSERTION N/A 10/24/2014   Procedure: PERMANENT PACEMAKER INSERTION;  Surgeon: Hillis Range, MD;  Location: [facility] CATH LAB;  Service: Cardiovascular;  Laterality: N/A;   TONSILLECTOMY     TRANSURETHRAL RESECTION OF PROSTATE N/A 07/06/2019   Procedure: TRANSURETHRAL RESECTION OF THE PROSTATE (TURP), BIPOLAR;  Surgeon: Rene Paci, MD;  Location: [facility];  Service: Urology;  Laterality: N/A;   US ECHOCARDIOGRAPHY  12/05/2010   proximal septal thickening,mild concentric LVH,mild deptal  hypokinesis,RV mildly dilated,LA mildly dilated,mild AI,moderate aortic root dilatation,septal motion c/w conduction abnormality    FAMILY HISTORY: Family History  Problem Relation Age of Onset   Breast cancer Mother    Colon cancer Father    Lung cancer Father     SOCIAL HISTORY:  Social History   Socioeconomic History   Marital status: Married    Spouse name: Not on file   Number of children: Not on file   Years of education: Not on file   Highest education level: Not on file  Occupational History   Not on file  Tobacco Use   Smoking status: Former    Current packs/day: 0.00    Types: Cigarettes, Cigars    Start date: 04/28/1949    Quit date: 10/27/1982    Years since quitting: 40.3   Smokeless tobacco: Never  Vaping Use   Vaping status: Never Used  Substance and Sexual Activity   Alcohol use: Not Currently    Comment: 1-2 drink per week   Drug use: No   Sexual activity: Not on file  Other Topics Concern   Not on file  Social History Narrative   Not on file   Social Determinants of Health   Financial Resource Strain: Not on file  Food Insecurity: Not on file  Transportation Needs: No Transportation Needs (07/29/2022)   PRAPARE - Administrator, Civil Service (Medical): No    Lack of Transportation (Non-Medical): No  Physical Activity: Not on file  Stress: Not on file  Social Connections: Not on file  Intimate Partner Violence: Not on file     PHYSICAL EXAM  Vitals:   02/11/23 1045  BP: (!) 86/45  Pulse: 65  Weight: 164 lb 8 oz (74.6 kg)  Height: 5\' 9"  (1.753 m)     Body mass index is 24.29 kg/m.     General: The patient is well-developed and well-nourished and in no acute distress   Neurologic Exam  Mental status: The patient is alert and oriented x 3 at the time of the examination. The patient has apparent normal recent and remote memory, with an apparently normal attention span and concentration ability.   Speech is  normal.  Cranial nerves: Extraocular muscles were intact and no diplopia or ptosis even with prolonged upgaze. He has slightly  reduced neck extension strength.  The voice is strong.. Trapezius is strong.    No obvious hearing deficits are noted.  Motor:  Muscle bulk is normal.   Tone is normal. Strength is  5 / 5 in the arms shoulders and neck.  Strength is 4-/5 in the L5 innervated foot and ankle muscles on the left but 5/5 elsewhere in the left leg.  EHL is 4/5 right and 4+/5 left and he is 5/5 elsewhere on the right.  He is almost able to stand up once without using his arms from a chair but cannot do this twice.  Sensory: He has reduced sensation in the left foot over the L5 dermatome.  Coordination: Cerebellar testing reveals good finger-nose-finger and mildly reduced left heel-to-shin .  Gait and station: Station is normal.  Stride is wide and gait is arthritic.  He can turn 180 degrees in 4  steps.  He has difficulty doing a tandem walk.  Romberg is negative.  Reflexes: Deep tendon reflexes are symmetric and normal in the arms and knees but absent at the ankles    DIAGNOSTIC DATA (LABS, IMAGING, TESTING) - I reviewed patient records, labs, notes, testing and imaging myself where available.  Lab Results  Component Value Date   WBC 5.6 08/06/2022   HGB 14.5 08/06/2022   HCT 42.8 08/06/2022   MCV 92 08/06/2022   PLT 205 08/06/2022      Component Value Date/Time   NA 141 12/24/2022 0943   K 4.1 12/24/2022 0943   CL 104 12/24/2022 0943   CO2 26 12/24/2022 0943   GLUCOSE 100 (H) 12/24/2022 0943   GLUCOSE 84 03/22/2021 0321   BUN 17 12/24/2022 0943   CREATININE 0.57 (L) 12/24/2022 0943   CALCIUM 9.0 12/24/2022 0943   PROT 6.3 12/24/2022 0943   ALBUMIN 4.1 12/24/2022 0943   AST 16 12/24/2022 0943   ALT 9 12/24/2022 0943   ALKPHOS 52 12/24/2022 0943   BILITOT 0.8 12/24/2022 0943   GFRNONAA >60 03/22/2021 0321   GFRAA 105 11/17/2019 1419   Lab Results  Component Value  Date   CHOL 136 12/24/2022   HDL 55 12/24/2022   LDLCALC 67 12/24/2022   TRIG 69 12/24/2022   CHOLHDL 2.5 12/24/2022   Lab Results  Component Value Date   HGBA1C 6.6 (H) 10/24/2014   Lab Results  Component Value Date   VITAMINB12 411 05/08/2021   Lab Results  Component Value Date   TSH 1.780 11/20/2020       ASSESSMENT AND PLAN    1. Myasthenia gravis (HCC)   2. High risk medication use   3. Gait abnormality   4. Diplopia   5. OSA (obstructive sleep apnea)   6. Urinary frequency       1.. Continue Mestinon 60 mg po qid and CellCept 250 mg po bid.   If MG flares up, consider IVIg or other agent and increase Cellcept to 500 mg po bid.     Higher dose of Mestinon caused GI issues 2.  Continue desmopressin for nocturia.     3.   His snoring improved after he lost weight, he probably no longer needs a CPAP.  He prefers not to do another sleep study to determine if he still has OSA. 4.   He will return to see me in 5-6 months but call sooner if he has new or worsening neurologic symptoms.      David [name] A. Epimenio Foot, MD, PhD 02/11/2023, 11:08 AM Certified in  Neurology, Clinical Neurophysiology, Sleep Medicine, Pain Medicine and Neuroimaging  [facility] [address], [address] [place], [place] [address] [phone]

## 2023-02-12 LAB — CBC WITH DIFFERENTIAL/PLATELET
Basophils Absolute: 0 10*3/uL (ref 0.0–0.2)
Basos: 0 %
EOS (ABSOLUTE): 0.2 10*3/uL (ref 0.0–0.4)
Eos: 3 %
Hematocrit: 44.4 % (ref 37.5–51.0)
Hemoglobin: 14.9 g/dL (ref 13.0–17.7)
Immature Grans (Abs): 0 10*3/uL (ref 0.0–0.1)
Immature Granulocytes: 0 %
Lymphocytes Absolute: 1.6 10*3/uL (ref 0.7–3.1)
Lymphs: 28 %
MCH: 32.1 pg (ref 26.6–33.0)
MCHC: 33.6 g/dL (ref 31.5–35.7)
MCV: 96 fL (ref 79–97)
Monocytes Absolute: 0.6 10*3/uL (ref 0.1–0.9)
Monocytes: 11 %
Neutrophils Absolute: 3.3 10*3/uL (ref 1.4–7.0)
Neutrophils: 58 %
Platelets: 201 10*3/uL (ref 150–450)
RBC: 4.64 x10E6/uL (ref 4.14–5.80)
RDW: 12.3 % (ref 11.6–15.4)
WBC: 5.7 10*3/uL (ref 3.4–10.8)

## 2023-02-12 LAB — COMPREHENSIVE METABOLIC PANEL
ALT: 16 IU/L (ref 0–44)
AST: 18 IU/L (ref 0–40)
Albumin: 4 g/dL (ref 3.7–4.7)
Alkaline Phosphatase: 55 IU/L (ref 44–121)
BUN/Creatinine Ratio: 30 — ABNORMAL HIGH (ref 10–24)
BUN: 19 mg/dL (ref 8–27)
Bilirubin Total: 1.1 mg/dL (ref 0.0–1.2)
CO2: 26 mmol/L (ref 20–29)
Calcium: 8.8 mg/dL (ref 8.6–10.2)
Chloride: 103 mmol/L (ref 96–106)
Creatinine, Ser: 0.63 mg/dL — ABNORMAL LOW (ref 0.76–1.27)
Globulin, Total: 2.1 g/dL (ref 1.5–4.5)
Glucose: 89 mg/dL (ref 70–99)
Potassium: 4.4 mmol/L (ref 3.5–5.2)
Sodium: 141 mmol/L (ref 134–144)
Total Protein: 6.1 g/dL (ref 6.0–8.5)
eGFR: 93 mL/min/{1.73_m2} (ref >59)

## 2023-02-23 DIAGNOSIS — D225 Melanocytic nevi of trunk: Secondary | ICD-10-CM | POA: Diagnosis not present

## 2023-02-23 DIAGNOSIS — Z85828 Personal history of other malignant neoplasm of skin: Secondary | ICD-10-CM | POA: Diagnosis not present

## 2023-02-23 DIAGNOSIS — Z85820 Personal history of malignant melanoma of skin: Secondary | ICD-10-CM | POA: Diagnosis not present

## 2023-02-23 DIAGNOSIS — L570 Actinic keratosis: Secondary | ICD-10-CM | POA: Diagnosis not present

## 2023-02-23 DIAGNOSIS — D485 Neoplasm of uncertain behavior of skin: Secondary | ICD-10-CM | POA: Diagnosis not present

## 2023-02-23 DIAGNOSIS — L821 Other seborrheic keratosis: Secondary | ICD-10-CM | POA: Diagnosis not present

## 2023-03-03 DIAGNOSIS — R059 Cough, unspecified: Secondary | ICD-10-CM | POA: Diagnosis not present

## 2023-03-03 DIAGNOSIS — H00012 Hordeolum externum right lower eyelid: Secondary | ICD-10-CM | POA: Diagnosis not present

## 2023-03-03 DIAGNOSIS — G7000 Myasthenia gravis without (acute) exacerbation: Secondary | ICD-10-CM | POA: Diagnosis not present

## 2023-03-13 DIAGNOSIS — E118 Type 2 diabetes mellitus with unspecified complications: Secondary | ICD-10-CM | POA: Diagnosis not present

## 2023-03-13 DIAGNOSIS — I10 Essential (primary) hypertension: Secondary | ICD-10-CM | POA: Diagnosis not present

## 2023-03-14 LAB — LAB REPORT - SCANNED
A1c: 5.6
Albumin, Urine POC: 7.4
Albumin/Creatinine Ratio, Urine, POC: 5
Creatinine, POC: 137.7 mg/dL
EGFR: 92

## 2023-03-18 DIAGNOSIS — E118 Type 2 diabetes mellitus with unspecified complications: Secondary | ICD-10-CM | POA: Diagnosis not present

## 2023-03-18 DIAGNOSIS — Z0000 Encounter for general adult medical examination without abnormal findings: Secondary | ICD-10-CM | POA: Diagnosis not present

## 2023-03-18 DIAGNOSIS — R053 Chronic cough: Secondary | ICD-10-CM | POA: Diagnosis not present

## 2023-03-18 DIAGNOSIS — I2510 Atherosclerotic heart disease of native coronary artery without angina pectoris: Secondary | ICD-10-CM | POA: Diagnosis not present

## 2023-03-18 DIAGNOSIS — G7000 Myasthenia gravis without (acute) exacerbation: Secondary | ICD-10-CM | POA: Diagnosis not present

## 2023-03-24 ENCOUNTER — Ambulatory Visit (INDEPENDENT_AMBULATORY_CARE_PROVIDER_SITE_OTHER): Payer: Medicare Other

## 2023-03-24 DIAGNOSIS — I5022 Chronic systolic (congestive) heart failure: Secondary | ICD-10-CM

## 2023-03-24 DIAGNOSIS — I442 Atrioventricular block, complete: Secondary | ICD-10-CM | POA: Diagnosis not present

## 2023-03-24 LAB — CUP PACEART REMOTE DEVICE CHECK
Battery Impedance: 1531 Ohm
Battery Remaining Longevity: 43 mo
Battery Voltage: 2.76 V
Brady Statistic AP VP Percent: 33 %
Brady Statistic AP VS Percent: 0 %
Brady Statistic AS VP Percent: 56 %
Brady Statistic AS VS Percent: 10 %
Date Time Interrogation Session: 20240827123006
Implantable Lead Connection Status: 753985
Implantable Lead Connection Status: 753985
Implantable Lead Implant Date: 20160329
Implantable Lead Implant Date: 20160329
Implantable Lead Location: 753859
Implantable Lead Location: 753860
Implantable Lead Model: 5076
Implantable Lead Model: 5092
Implantable Pulse Generator Implant Date: 20160329
Lead Channel Impedance Value: 415 Ohm
Lead Channel Impedance Value: 572 Ohm
Lead Channel Pacing Threshold Amplitude: 0.5 V
Lead Channel Pacing Threshold Amplitude: 1 V
Lead Channel Pacing Threshold Pulse Width: 0.4 ms
Lead Channel Pacing Threshold Pulse Width: 0.4 ms
Lead Channel Setting Pacing Amplitude: 2 V
Lead Channel Setting Pacing Amplitude: 2.5 V
Lead Channel Setting Pacing Pulse Width: 0.4 ms
Lead Channel Setting Sensing Sensitivity: 2.8 mV
Zone Setting Status: 755011
Zone Setting Status: 755011

## 2023-03-25 ENCOUNTER — Other Ambulatory Visit (HOSPITAL_BASED_OUTPATIENT_CLINIC_OR_DEPARTMENT_OTHER): Payer: Self-pay

## 2023-04-01 NOTE — Progress Notes (Signed)
Remote pacemaker transmission.   

## 2023-04-13 DIAGNOSIS — M810 Age-related osteoporosis without current pathological fracture: Secondary | ICD-10-CM | POA: Diagnosis not present

## 2023-04-16 ENCOUNTER — Other Ambulatory Visit: Payer: Self-pay | Admitting: Neurology

## 2023-04-22 ENCOUNTER — Other Ambulatory Visit: Payer: Self-pay | Admitting: Cardiology

## 2023-04-27 ENCOUNTER — Institutional Professional Consult (permissible substitution): Payer: Medicare Other | Admitting: Student in an Organized Health Care Education/Training Program

## 2023-05-06 ENCOUNTER — Encounter: Payer: Self-pay | Admitting: Internal Medicine

## 2023-05-06 ENCOUNTER — Ambulatory Visit: Payer: Medicare Other | Admitting: Internal Medicine

## 2023-05-06 ENCOUNTER — Ambulatory Visit (HOSPITAL_COMMUNITY)
Admission: RE | Admit: 2023-05-06 | Discharge: 2023-05-06 | Disposition: A | Discharge status: Home / Self Care | Payer: Medicare Other | Source: Ambulatory Visit | Attending: Internal Medicine | Admitting: Internal Medicine

## 2023-05-06 VITALS — BP 99/55 | HR 61 | Ht 69.0 in | Wt 173.0 lb

## 2023-05-06 DIAGNOSIS — R0609 Other forms of dyspnea: Secondary | ICD-10-CM | POA: Insufficient documentation

## 2023-05-06 DIAGNOSIS — R058 Other specified cough: Secondary | ICD-10-CM | POA: Insufficient documentation

## 2023-05-06 DIAGNOSIS — Z950 Presence of cardiac pacemaker: Secondary | ICD-10-CM | POA: Diagnosis not present

## 2023-05-06 DIAGNOSIS — R918 Other nonspecific abnormal finding of lung field: Secondary | ICD-10-CM | POA: Diagnosis not present

## 2023-05-06 DIAGNOSIS — R059 Cough, unspecified: Secondary | ICD-10-CM | POA: Diagnosis not present

## 2023-05-06 NOTE — Patient Instructions (Addendum)
Try off clariton and only take as needed for itchy/ sneezy / runny nose worse than you notice when you don't take it   Make sure you check your oxygen saturation at your highest level of activity(NOT after you stop)  to be sure it stays over 90% and keep track of it at least once a week, more often if breathing getting worse, and let me know if losing ground. (Collect the dots to connect the dots approach - once a week)   You may need another formal speech/swallowing  eval but I am deferring this to your daughter and Dr Renne Crigler    Please remember to go to the  x-ray department  @  [facility] for your tests - we will call you with the results when they are available     Please schedule a follow up visit in 3 months but call sooner if needed  [facility]) bring all medications - including medications

## 2023-05-06 NOTE — Assessment & Plan Note (Signed)
Doe/ cough x April 2024  - echo 09/03/22  EF 40-35% mod AI  - 05/06/2023   Walked on RA  x  1  lap(s) =  approx 150  ft  @ nl  pace, stopped due to unsteady on feet  with lowest 02 sats 94%   At this point he is more limited by geriatric decline/ AI than any pulmonary issue though he may have mild ILD and rec serial sats and HRCT if note trend down:  Make sure you check your oxygen saturation at your highest level of activity(NOT after you stop)  to be sure it stays over 90% and keep track of it at least once a week, more often if breathing getting worse, and let me know if losing ground. (Collect the dots to connect the dots approach)

## 2023-05-06 NOTE — Assessment & Plan Note (Addendum)
Onset April 2024 in setting of Chronic rhinitis x 2014  and allergy testing ? Bethany? Unhelpful - 05/06/2023 rec clariton prn but stop it if not better on vs off and maybe try zyrtec if tolerates  - low threshold to repeat ST eval for ? Recurrent aspiration?  And do ent eval for chronic rhinitis or repeat the CT sinus if cough recurs.   Discussed in detail all the  indications, usual  risks and alternatives  relative to the benefits with patient who agrees to proceed with w/u as outlined.      >>>> F/u in 3 m, sooner if needed, in [place]  with all meds in hand using a trust but verify approach to confirm accurate Medication  Reconciliation The principal here is that until we are certain that the  patients are doing what we've asked, it makes no sense to ask them to do more.   Each maintenance medication was reviewed in detail including emphasizing most importantly the difference between maintenance and prns and under what circumstances the prns are to be triggered using an action plan format where appropriate.  Total time for H and P, chart review, counseling,  directly observing portions of ambulatory 02 saturation study/ and generating customized AVS unique to this office visit / same day charting > 60 min new pt eval

## 2023-05-06 NOTE — Progress Notes (Signed)
01/2023    David [name]., David [name]    DOB: 1936-01-28    MRN: [id number]   Brief patient profile:  81  yowm  quit smoking 1984 (mostly)  s apparent resp sequelae  referred to pulmonary clinic in [place]  05/06/2023 by Dr Renne Crigler  for recurrent cough  x  01/2023 asosoc with pnds x 2014.     History of Present Illness  05/06/2023  Pulmonary/ 1st office eval/ Gwen Edler / [place] [facility]  Chief Complaint  Patient presents with   Establish Care   Cough  Dyspnea:  limited by weak legs / balance issues > sob  slow pace - usually stays in car  when goes anywhere while fm goes in. Cough: white mucus x one tbsp ended 05/02/23 s obvious reason   Sleep: bed is flat/ one pillow on R side s noct cc  SABA use: none  02: none    Says swallowing fine now but occ chokes on food / daughter is former  ST Pnds x 10 years > allergy testing > ? Bethany  "pos tomatoes" No pets at home    No obvious day to day or daytime pattern/variability or assoc excess/ purulent sputum or mucus plugs or hemoptysis or cp or chest tightness, subjective wheeze or overt sinus or hb symptoms.    Also denies any obvious fluctuation of symptoms with weather or environmental changes or other aggravating or alleviating factors except as outlined above   No unusual exposure hx or h/o childhood pna/ asthma or knowledge of premature birth.  Current Allergies, Complete Past Medical History, Past Surgical History, Family History, and Social History were reviewed in [name] record.  ROS  The following are not active complaints unless bolded Hoarseness, sore throat, dysphagia, dental problems, itching, sneezing,  nasal congestion and sensation of discharge of excess mucus or purulent secretions, ear ache,   fever, chills, sweats, unintended wt loss or wt gain, classically pleuritic or exertional cp,  orthopnea pnd or arm/hand swelling  or leg swelling, presyncope, palpitations, abdominal pain, anorexia, nausea,  vomiting, diarrhea  or change in bowel habits or change in bladder habits, change in stools or change in urine, dysuria, hematuria,  rash, arthralgias, visual complaints, headache, numbness, weakness or ataxia or problems with walking or coordination,  change in mood or  memory.            Outpatient Medications Prior to Visit  Medication Sig Dispense Refill   aspirin EC 81 MG tablet Take 81 mg by mouth daily.      carvedilol (COREG) 6.25 MG tablet TAKE 1 TABLET(6.25 MG) BY MOUTH TWICE DAILY 180 tablet 2   Cholecalciferol (VITAMIN D3) 50 MCG (2000 UT) CAPS Take 1 capsule by mouth daily.     desmopressin (DDAVP) 0.1 MG tablet TAKE 1 TABLET(0.1 MG) BY MOUTH AT BEDTIME 30 tablet 0   ezetimibe-simvastatin (VYTORIN) 10-40 MG tablet Take 1 tablet by mouth daily.     JARDIANCE 10 MG TABS tablet TAKE 1 TABLET BY MOUTH EVERY DAY 90 tablet 3   loratadine (CLARITIN) 10 MG tablet Take 10 mg by mouth daily.     losartan (COZAAR) 25 MG tablet Take 1 tablet (25 mg total) by mouth daily. 90 tablet 3   mycophenolate (CELLCEPT) 250 MG capsule TAKE 1 CAPSULE BY MOUTH TWICE DAILY 180 capsule 0   MYRBETRIQ 50 MG TB24 tablet Take 50 mg by mouth daily.     nitroGLYCERIN (NITROSTAT) 0.4 MG SL tablet Place 1  tablet (0.4 mg total) under the tongue every 5 (five) minutes as needed for chest pain. 25 tablet 5   Polyethyl Glycol-Propyl Glycol (SYSTANE) 0.4-0.3 % SOLN Place 1 drop into both eyes daily as needed (dry eyes).     pyridostigmine (MESTINON) 60 MG tablet TAKE 2 TABLET BY MOUTH FOUR TIMES DAILY. (Patient taking differently: Take 60 mg by mouth 4 (four) times daily. TAKE 1 TABLET BY MOUTH FOUR TIMES DAILY.) 720 tablet 3   spironolactone (ALDACTONE) 25 MG tablet TAKE 1 TABLET(25 MG) BY MOUTH DAILY 30 tablet 7   tamsulosin (FLOMAX) 0.4 MG CAPS capsule Take 1 capsule (0.4 mg total) by mouth daily. 60 capsule 2   desmopressin (DDAVP) 0.1 MG tablet TAKE 1 TABLET(100 MCG) BY MOUTH AT BEDTIME (Patient not taking: Reported  on 02/11/2023) 90 tablet 1   furosemide (LASIX) 20 MG tablet Take 1 tablet (20 mg total) by mouth as needed for edema (for weight gain of 2-3lb and ankle edema). 30 tablet 11   No facility-administered medications prior to visit.    Past Medical History:  Diagnosis Date   Aortic insufficiency    Ascending aorta dilation (HCC)    Balance problem    CAD (coronary artery disease)    previous stent to LAD 2003   Diabetes mellitus without complication (HCC)    No longer taking diabetic medications at this time   GI bleeding    HFrEF (heart failure with reduced ejection fraction) (HCC)    History of complete heart block    History of kidney stones    Hyperlipidemia    Hypertension    Iron deficiency anemia    LBBB (left bundle branch block)    Melanoma (HCC)    Myasthenia gravis (HCC)    eyelids, finger, and toes   NSVT (nonsustained ventricular tachycardia) (HCC)    OSA (obstructive sleep apnea)    Does not wear CPAP.   Presence of permanent cardiac pacemaker    S/P cardiac pacemaker procedure, placement 10/24/14 Medtronic Adapta L model ADDRL 1  10/25/2014   Spinal stenosis    Severe   Tricuspid regurgitation       Objective:     BP (!) 99/55   Pulse 61   Ht 5\' 9"  (1.753 m)   Wt 173 lb (78.5 kg)   SpO2 94%   BMI 25.55 kg/m   SpO2: 94 % RA  Frail elderly wm unsteady gait / slt hoarse /    HEENT : Oropharynx  clear     Nasal turbinates nl    NECK :  without  apparent JVD/ palpable Nodes/TM    LUNGS: no acc muscle use,  Nl contour chest min insp/exp rhonchi bilterally    CV:  RRR  no  S3   2/6 diastolic  murmur s increase in P2, and no edema   ABD:  soft and nontender with nl inspiratory excursion in the supine position. No bruits or organomegaly appreciated   MS:  Nl gait/ ext warm without deformities Or obvious joint restrictions  calf tenderness, cyanosis or clubbing    SKIN: warm and dry without lesions    NEURO:  alert, approp, nl sensorium with  no  motor or cerebellar deficits apparent.      CTa 05/16/21 Mild progression of peripheral subpleural interstitial changes and minor subpleural bibasilar fibrosis. subcentimeter calcified granuloma in the right lower lobe.  CT face:  07/20/16  Mild inflammatory paranasal sinus disease.   CXR PA and Lateral:  05/06/2023 :    I personally reviewed images and impression is as follows:     CM with coarsened markings minimally worse vs prior cxr 03/21/21      Assessment   DOE (dyspnea on exertion) Doe/ cough x April 2024  - echo 09/03/22  EF 40-35% mod AI  - 05/06/2023   Walked on RA  x  1  lap(s) =  approx 150  ft  @ nl  pace, stopped due to unsteady on feet  with lowest 02 sats 94%   At this point he is more limited by geriatric decline/ AI than any pulmonary issue though he may have mild ILD and rec serial sats and HRCT if note trend down:  Make sure you check your oxygen saturation at your highest level of activity(NOT after you stop)  to be sure it stays over 90% and keep track of it at least once a week, more often if breathing getting worse, and let me know if losing ground. (Collect the dots to connect the dots approach)    Upper airway cough syndrome Onset April 2024 in setting of Chronic rhinitis x 2014  and allergy testing ? Bethany? Unhelpful - 05/06/2023 rec clariton prn but stop it if not better on vs off and maybe try zyrtec if tolerates  - low threshold to repeat ST eval for ? Recurrent aspiration?  And do ent eval for chronic rhinitis or repeat the CT sinus if cough recurs.   Discussed in detail all the  indications, usual  risks and alternatives  relative to the benefits with patient who agrees to proceed with w/u as outlined.      >>>> F/u in 3 m, sooner if needed, in [place]  with all meds in hand using a trust but verify approach to confirm accurate Medication  Reconciliation The principal here is that until we are certain that the  patients are doing what we've asked, it makes  no sense to ask them to do more.   Each maintenance medication was reviewed in detail including emphasizing most importantly the difference between maintenance and prns and under what circumstances the prns are to be triggered using an action plan format where appropriate.  Total time for H and P, chart review, counseling,  directly observing portions of ambulatory 02 saturation study/ and generating customized AVS unique to this office visit / same day charting > 60 min new pt eval                    Sandrea Hughs, MD 05/06/2023

## 2023-05-13 DIAGNOSIS — M792 Neuralgia and neuritis, unspecified: Secondary | ICD-10-CM | POA: Diagnosis not present

## 2023-05-20 ENCOUNTER — Telehealth: Payer: Self-pay | Admitting: Internal Medicine

## 2023-05-20 NOTE — Telephone Encounter (Signed)
Please call w/Rad results @ [phone]

## 2023-05-20 NOTE — Telephone Encounter (Signed)
No acute findings, we'll call with the formal report when available

## 2023-05-20 NOTE — Telephone Encounter (Signed)
Spoke with patient and advised. Nothing further needed at this time.   

## 2023-05-20 NOTE — Telephone Encounter (Signed)
CXR 05/06/23 not read yet.

## 2023-05-21 ENCOUNTER — Institutional Professional Consult (permissible substitution): Payer: Medicare Other | Admitting: Pulmonary Disease

## 2023-05-26 ENCOUNTER — Other Ambulatory Visit: Payer: Self-pay | Admitting: Neurology

## 2023-07-07 DIAGNOSIS — K080 Exfoliation of teeth due to systemic causes: Secondary | ICD-10-CM | POA: Diagnosis not present

## 2023-07-16 DIAGNOSIS — L821 Other seborrheic keratosis: Secondary | ICD-10-CM | POA: Diagnosis not present

## 2023-07-16 DIAGNOSIS — L570 Actinic keratosis: Secondary | ICD-10-CM | POA: Diagnosis not present

## 2023-07-16 DIAGNOSIS — Z85828 Personal history of other malignant neoplasm of skin: Secondary | ICD-10-CM | POA: Diagnosis not present

## 2023-07-20 ENCOUNTER — Other Ambulatory Visit: Payer: Self-pay | Admitting: Neurology

## 2023-07-20 ENCOUNTER — Other Ambulatory Visit: Payer: Self-pay

## 2023-07-25 ENCOUNTER — Other Ambulatory Visit: Payer: Self-pay | Admitting: Neurology

## 2023-07-27 NOTE — Telephone Encounter (Signed)
Last seen on 02/11/23 Follow up scheduled on 08/25/22

## 2023-08-06 ENCOUNTER — Telehealth: Payer: Self-pay | Admitting: Neurology

## 2023-08-06 NOTE — Telephone Encounter (Signed)
 Is anyone in [place] team to able to help with this tomorrow? I tried to do it on CMM but was unable. I started this KEY but it will not open.

## 2023-08-06 NOTE — Telephone Encounter (Signed)
 Pt states that he is needing a refill on his  mycophenolate (CELLCEPT) 250 MG capsule but the insurance is requiring a PA. Pt is about to run out tomorrow. Please advise.

## 2023-08-07 NOTE — Telephone Encounter (Signed)
 Pt called wanting to know the update on the PA. Pt stated that he is running out. Pt was advised to speak to the pharmacy to see if they can do an emergency fill for him for the time being until the approval comes in.

## 2023-08-10 ENCOUNTER — Other Ambulatory Visit (HOSPITAL_COMMUNITY): Payer: Self-pay

## 2023-08-10 ENCOUNTER — Telehealth: Payer: Self-pay

## 2023-08-10 NOTE — Telephone Encounter (Signed)
 Pharmacy Patient Advocate Encounter   Received notification from Physician's Office that prior authorization for Mycophenolate  Mofetil 250MG  capsules is required/requested.   Insurance verification completed.   The patient is insured through [facility] .   Per test claim: PA required; PA started via CoverMyMeds. KEY [id number] . Waiting for clinical questions to populate.   Clinical questions have been submitted-awaiting determination (was submitted as an Urgent request)

## 2023-08-10 NOTE — Telephone Encounter (Signed)
 Called and spoke to pt and stated that we are working on pa

## 2023-08-10 NOTE — Telephone Encounter (Signed)
 Pt has called back to speak further with CMA re: his mycophenolate (CELLCEPT) 250 MG capsule

## 2023-08-11 ENCOUNTER — Other Ambulatory Visit (HOSPITAL_COMMUNITY): Payer: Self-pay

## 2023-08-11 NOTE — Telephone Encounter (Signed)
 Called pt at [phone]. Relayed PA approved and he should now be able to pick this up from the pharmacy. He verbalized understanding and appreciation.

## 2023-08-11 NOTE — Telephone Encounter (Signed)
 Pharmacy Patient Advocate Encounter  Received notification from [facility] that Prior Authorization for Mycophenolate  Mofetil 250MG  capsules has been APPROVED from 08/10/2023 to 08/09/2024. Ran test claim, Copay is $18.00. This test claim was processed through [facility]- copay amounts may vary at other pharmacies due to pharmacy/plan contracts, or as the patient moves through the different stages of their insurance plan.   PA #/Case ID/Reference #: PA Case ID #: [id number]

## 2023-08-16 NOTE — Progress Notes (Deleted)
 David [name]., male    DOB: [birth year]    MRN: [id number]   Brief patient profile:  23  yowm quit smoking 1984 (mostly)  s apparent resp sequelae  referred to pulmonary clinic in [place]  05/06/2023 by Dr Renne Crigler  for recurrent cough  x  01/2023 asosoc with pnds x 2014.     History of Present Illness  05/06/2023  Pulmonary/ 1st office eval/ Bernette Seeman / [place] Office  Chief Complaint  Patient presents with   Establish Care   Cough  Dyspnea:  limited by weak legs / balance issues > sob  slow pace - usually stays in car  when goes anywhere while fm goes in. Cough: white mucus x one tbsp ended 05/02/23 s obvious reason   Sleep: bed is flat/ one pillow on R side s noct cc  SABA use: none  02: none    Says swallowing fine now but occ chokes on food / daughter is former  ST Pnds x 10 years > allergy testing > ? Bethany  "pos tomatoes" No pets at home  Rec Try off clariton and only take as needed for itchy/ sneezy / runny nose worse than you notice when you don't take it  Make sure you check your oxygen saturation at your highest level of activity(NOT after you stop)  to be sure it stays over 90% and keep track of it at least once a week, more often if breathing getting worse, and let me know if losing ground. (Collect the dots to connect the dots approach - once a week) You may need another formal speech/swallowing  eval but I am deferring this to your daughter and Dr Renne Crigler  Please schedule a follow up visit in 3 months but call sooner if needed  [facility]) bring all medications - including medications    Cxr c/w ILD  05/06/23    08/18/2023  f/u ov/Dayshaun Whobrey re:  recurrent cough  x  01/2023 asosoc with pnds x 2014.   maint on ***  did *** bring meds  No chief complaint on file.   Dyspnea:  *** Cough: *** Sleeping: *** resp cc  SABA use: *** 02: ***  Lung cancer screening :  ***    No obvious day to day or daytime variability or assoc excess/ purulent sputum or mucus plugs or  hemoptysis or cp or chest tightness, subjective wheeze or overt sinus or hb symptoms.    Also denies any obvious fluctuation of symptoms with weather or environmental changes or other aggravating or alleviating factors except as outlined above   No unusual exposure hx or h/o childhood pna/ asthma or knowledge of premature birth.  Current Allergies, Complete Past Medical History, Past Surgical History, Family History, and Social History were reviewed in [name] record.  ROS  The following are not active complaints unless bolded Hoarseness, sore throat, dysphagia, dental problems, itching, sneezing,  nasal congestion or discharge of excess mucus or purulent secretions, ear ache,   fever, chills, sweats, unintended wt loss or wt gain, classically pleuritic or exertional cp,  orthopnea pnd or arm/hand swelling  or leg swelling, presyncope, palpitations, abdominal pain, anorexia, nausea, vomiting, diarrhea  or change in bowel habits or change in bladder habits, change in stools or change in urine, dysuria, hematuria,  rash, arthralgias, visual complaints, headache, numbness, weakness or ataxia or problems with walking or coordination,  change in mood or  memory.        No outpatient medications  have been marked as taking for the 08/18/23 encounter (Appointment) with Nyoka Cowden, MD.           Past Medical History:  Diagnosis Date   Aortic insufficiency    Ascending aorta dilation (HCC)    Balance problem    CAD (coronary artery disease)    previous stent to LAD 2003   Diabetes mellitus without complication (HCC)    No longer taking diabetic medications at this time   GI bleeding    HFrEF (heart failure with reduced ejection fraction) (HCC)    History of complete heart block    History of kidney stones    Hyperlipidemia    Hypertension    Iron deficiency anemia    LBBB (left bundle branch block)    Melanoma (HCC)    Myasthenia gravis (HCC)    eyelids,  finger, and toes   NSVT (nonsustained ventricular tachycardia) (HCC)    OSA (obstructive sleep apnea)    Does not wear CPAP.   Presence of permanent cardiac pacemaker    S/P cardiac pacemaker procedure, placement 10/24/14 Medtronic Adapta L model ADDRL 1  10/25/2014   Spinal stenosis    Severe   Tricuspid regurgitation       Objective:    Wt Readings from Last 3 Encounters:  05/06/23 173 lb (78.5 kg)  02/11/23 164 lb 8 oz (74.6 kg)  12/19/22 168 lb (76.2 kg)      Vital signs reviewed  08/18/2023  - Note at rest 02 sats  ***% on ***   General appearance:    ***         2/6 diastolic  murmur***  CTa 05/16/21 Mild progression of peripheral subpleural interstitial changes and minor subpleural bibasilar fibrosis. subcentimeter calcified granuloma in the right lower lobe.  CT face:  07/20/16  Mild inflammatory paranasal sinus disease.   CXR PA and Lateral:   05/06/2023 :    I personally reviewed images and impression is as follows:     CM with coarsened markings minimally worse vs prior cxr 03/21/21      Assessment

## 2023-08-18 ENCOUNTER — Ambulatory Visit: Payer: Medicare Other | Admitting: Internal Medicine

## 2023-08-19 NOTE — Progress Notes (Signed)
David L Darrin Nipper., male    DOB: [birth year]    MRN: [id number]   Brief patient profile:  41  yowm quit smoking 1984 (mostly)  s apparent resp sequelae  referred to pulmonary clinic in [place]  05/06/2023 by Dr Renne Crigler  for recurrent cough  x  01/2023 asosoc with pnds x 2014.    History of Present Illness  05/06/2023  Pulmonary/ 1st office eval/ Taeveon Keesling / [place] Office  Chief Complaint  Patient presents with   Establish Care   Cough  Dyspnea:  limited by weak legs / balance issues > sob  slow pace - usually stays in car  when goes anywhere while fm goes in. Cough: white mucus x one tbsp ended 05/02/23 s obvious reason   Sleep: bed is flat/ one pillow on R side s noct cc  SABA use: none  02: none  Says swallowing fine now but occ chokes on food / daughter is former  ST Pnds x 10 years > allergy testing > ? [place]  "pos tomatoes" No pets at home  Rec Try off clariton and only take as needed for itchy/ sneezy / runny nose worse than you notice when you don't take it  Make sure you check your oxygen saturation at your highest level of activity(NOT after you stop)  to be sure it stays over 90% and keep track of it at least once a week, more often if breathing getting worse, and let me know if losing ground. (Collect the dots to connect the dots approach - once a week) You may need another formal speech/swallowing  eval but I am deferring this to your daughter and Dr Renne Crigler  Please schedule a follow up visit in 3 months but call sooner if needed  [facility]) bring all medications - including medications    Cxr c/w ILD  05/06/23    08/20/2023  f/u ov/Miguel Medal re:  recurrent cough  x  01/2023  assoc with pnds x 2014.     Chief Complaint  Patient presents with   Follow-up    Gaining weight.  Breathing is good.  Dyspnea:  very sedentary  Cough: dry/ sporadic/ no assoc with meals Sleeping: flat bed / no resp cc  SABA use: not helpful     No obvious day to day or daytime variability or  assoc excess/ purulent sputum or mucus plugs or hemoptysis or cp or chest tightness, subjective wheeze or overt sinus or hb symptoms.    Also denies any obvious fluctuation of symptoms with weather or environmental changes or other aggravating or alleviating factors except as outlined above   No unusual exposure hx or h/o childhood pna/ asthma or knowledge of premature birth.  Current Allergies, Complete Past Medical History, Past Surgical History, Family History, and Social History were reviewed in [name] record.  ROS  The following are not active complaints unless bolded Hoarseness, sore throat, dysphagia, dental problems, itching, sneezing,  nasal congestion or discharge of excess mucus or purulent secretions, ear ache,   fever, chills, sweats, unintended wt loss or wt gain, classically pleuritic or exertional cp,  orthopnea pnd or arm/hand swelling  or leg swelling, presyncope, palpitations, abdominal pain, anorexia, nausea, vomiting, diarrhea  or change in bowel habits or change in bladder habits, change in stools or change in urine, dysuria, hematuria,  rash, arthralgias, visual complaints, headache, numbness, weakness or ataxia or problems with walking or coordination,  change in mood or  memory.  Current Meds  Medication Sig   aspirin EC 81 MG tablet Take 81 mg by mouth daily.    carvedilol (COREG) 6.25 MG tablet TAKE 1 TABLET(6.25 MG) BY MOUTH TWICE DAILY   Cholecalciferol (VITAMIN D3) 50 MCG (2000 UT) CAPS Take 1 capsule by mouth daily.   desmopressin (DDAVP) 0.1 MG tablet TAKE 1 TABLET(100 MCG) BY MOUTH AT BEDTIME   desmopressin (DDAVP) 0.1 MG tablet TAKE 1 TABLET(0.1 MG) BY MOUTH AT BEDTIME   ezetimibe-simvastatin (VYTORIN) 10-40 MG tablet Take 1 tablet by mouth daily.   JARDIANCE 10 MG TABS tablet TAKE 1 TABLET BY MOUTH EVERY DAY   loratadine (CLARITIN) 10 MG tablet Take 10 mg by mouth daily.   losartan (COZAAR) 25 MG tablet Take 1 tablet (25 mg  total) by mouth daily.   mycophenolate (CELLCEPT) 250 MG capsule TAKE 1 CAPSULE BY MOUTH TWICE DAILY   MYRBETRIQ 50 MG TB24 tablet Take 50 mg by mouth daily.   nitroGLYCERIN (NITROSTAT) 0.4 MG SL tablet Place 1 tablet (0.4 mg total) under the tongue every 5 (five) minutes as needed for chest pain.   Polyethyl Glycol-Propyl Glycol (SYSTANE) 0.4-0.3 % SOLN Place 1 drop into both eyes daily as needed (dry eyes).   pyridostigmine (MESTINON) 60 MG tablet Take 1 tablet (60 mg total) by mouth 4 (four) times daily. TAKE 1 TABLET BY MOUTH FOUR TIMES DAILY.   spironolactone (ALDACTONE) 25 MG tablet TAKE 1 TABLET(25 MG) BY MOUTH DAILY   tamsulosin (FLOMAX) 0.4 MG CAPS capsule Take 1 capsule (0.4 mg total) by mouth daily.           Past Medical History:  Diagnosis Date   Aortic insufficiency    Ascending aorta dilation (HCC)    Balance problem    CAD (coronary artery disease)    previous stent to LAD 2003   Diabetes mellitus without complication (HCC)    No longer taking diabetic medications at this time   GI bleeding    HFrEF (heart failure with reduced ejection fraction) (HCC)    History of complete heart block    History of kidney stones    Hyperlipidemia    Hypertension    Iron deficiency anemia    LBBB (left bundle branch block)    Melanoma (HCC)    Myasthenia gravis (HCC)    eyelids, finger, and toes   NSVT (nonsustained ventricular tachycardia) (HCC)    OSA (obstructive sleep apnea)    Does not wear CPAP.   Presence of permanent cardiac pacemaker    S/P cardiac pacemaker procedure, placement 10/24/14 Medtronic Adapta L model ADDRL 1  10/25/2014   Spinal stenosis    Severe   Tricuspid regurgitation       Objective:    Wt Readings from Last 3 Encounters:  08/20/23 185 lb 9.6 oz (84.2 kg)  05/06/23 173 lb (78.5 kg)  02/11/23 164 lb 8 oz (74.6 kg)     Vital signs reviewed  08/20/2023  - Note at rest 02 sats  94% on RA   General appearance:    amb pleasant wm nad/ walks  with cane     HEENT : Oropharynx  clear/ slt hoarse        NECK :  without  apparent JVD/ palpable Nodes/TM    LUNGS: no acc muscle use,  Nl contour chest  with a distant insp crackles bases   CV:  RRR  no s3  with 2/6 diastolic murmur  or increase in P2, and no edema  ABD:  soft and nontender   MS:    ext warm without deformities Or obvious joint restrictions  calf tenderness, cyanosis or clubbing    SKIN: warm and dry without lesions    NEURO:  alert, approp, nl sensorium with  no motor or cerebellar deficits apparent.            CTa 05/16/21 Mild progression of peripheral subpleural interstitial changes and minor subpleural bibasilar fibrosis. subcentimeter calcified granuloma in the right lower lobe.  CT face:  07/20/16  Mild inflammatory paranasal sinus disease.   CXR PA and Lateral:   05/06/2023 :    I personally reviewed images and impression is as follows:     CM with coarsened markings minimally worse vs prior cxr 03/21/21      Assessment

## 2023-08-20 ENCOUNTER — Ambulatory Visit: Payer: Medicare Other | Admitting: Internal Medicine

## 2023-08-20 ENCOUNTER — Encounter: Payer: Self-pay | Admitting: Internal Medicine

## 2023-08-20 VITALS — BP 106/62 | HR 68 | Temp 97.9°F | Ht 69.0 in | Wt 185.6 lb

## 2023-08-20 DIAGNOSIS — R0609 Other forms of dyspnea: Secondary | ICD-10-CM

## 2023-08-20 NOTE — Patient Instructions (Signed)
Make sure you check your oxygen saturation at your highest level of activity(NOT after you stop)  to be sure it stays over 90% and keep track of it at least once a week, more often if breathing getting worse, and let me know if losing ground. (Collect the dots to connect the dots approach)     Please schedule a follow up visit in 6 months but call sooner if needed

## 2023-08-22 ENCOUNTER — Encounter: Payer: Self-pay | Admitting: Internal Medicine

## 2023-08-22 NOTE — Assessment & Plan Note (Signed)
Doe/ cough x April 2024  - echo 09/03/22  EF 40-35% mod AI  - 05/06/2023   Walked on RA  x  1  lap(s) =  approx 150  ft  @ nl  pace, stopped due to unsteady on feet  with lowest 02 sats 94%  - 08/20/2023   Walked on RA  x  one  lap(s) =  approx 250  ft  @ slow/cane pace, stopped due to fatigue  with lowest 02 sats 95%    Multifactorial and not likely related to occult ILD with sats above noted  - clearly there is deconditioning and concern with AI as well as generalized geriactric decline all contributing to doe   Advised  Walk as much possible and check sats at peak ex to track over time and let me know if losing ground.  F/u in 6 m, sooner if needed  Each maintenance medication was reviewed in detail including emphasizing most importantly the difference between maintenance and prns and under what circumstances the prns are to be triggered using an action plan format where appropriate.  Total time for H and P, chart review, counseling,  directly observing portions of ambulatory 02 saturation study/ and generating customized AVS unique to this office visit / same day charting = 24 min

## 2023-08-24 DIAGNOSIS — K080 Exfoliation of teeth due to systemic causes: Secondary | ICD-10-CM | POA: Diagnosis not present

## 2023-08-26 ENCOUNTER — Encounter: Payer: Self-pay | Admitting: Neurology

## 2023-08-26 ENCOUNTER — Ambulatory Visit: Payer: Medicare Other | Admitting: Neurology

## 2023-08-28 ENCOUNTER — Other Ambulatory Visit: Payer: Self-pay | Admitting: Neurology

## 2023-08-31 DIAGNOSIS — M792 Neuralgia and neuritis, unspecified: Secondary | ICD-10-CM | POA: Diagnosis not present

## 2023-09-02 DIAGNOSIS — L570 Actinic keratosis: Secondary | ICD-10-CM | POA: Diagnosis not present

## 2023-09-02 DIAGNOSIS — Z85828 Personal history of other malignant neoplasm of skin: Secondary | ICD-10-CM | POA: Diagnosis not present

## 2023-09-02 DIAGNOSIS — L821 Other seborrheic keratosis: Secondary | ICD-10-CM | POA: Diagnosis not present

## 2023-09-02 DIAGNOSIS — D225 Melanocytic nevi of trunk: Secondary | ICD-10-CM | POA: Diagnosis not present

## 2023-09-04 ENCOUNTER — Other Ambulatory Visit: Payer: Self-pay | Admitting: Neurology

## 2023-10-05 ENCOUNTER — Other Ambulatory Visit: Payer: Self-pay | Admitting: Neurology

## 2023-10-23 ENCOUNTER — Other Ambulatory Visit: Payer: Self-pay | Admitting: Neurology

## 2023-10-23 NOTE — Telephone Encounter (Signed)
 Last seen on 02/11/23 No6 month follow up scheduled   Too soon to refill last filled on 10/06/23

## 2023-10-24 ENCOUNTER — Other Ambulatory Visit: Payer: Self-pay | Admitting: Neurology

## 2023-10-26 NOTE — Telephone Encounter (Signed)
 Last seen on 02/11/23 No follow up scheduled

## 2023-10-28 ENCOUNTER — Other Ambulatory Visit: Payer: Self-pay

## 2023-10-28 ENCOUNTER — Other Ambulatory Visit: Payer: Self-pay | Admitting: Cardiology

## 2023-10-28 DIAGNOSIS — I5022 Chronic systolic (congestive) heart failure: Secondary | ICD-10-CM

## 2023-10-28 DIAGNOSIS — M810 Age-related osteoporosis without current pathological fracture: Secondary | ICD-10-CM | POA: Diagnosis not present

## 2023-10-28 MED ORDER — LOSARTAN POTASSIUM 25 MG PO TABS: 25.0000 mg | ORAL_TABLET | Freq: Every day | ORAL | 0 refills | Status: DC

## 2023-11-04 ENCOUNTER — Other Ambulatory Visit: Payer: Self-pay | Admitting: Neurology

## 2023-11-04 NOTE — Telephone Encounter (Signed)
 Last seen on 02/11/23 Follow up scheduled on 11/09/23  Pt can discuss below at that visit.

## 2023-11-04 NOTE — Telephone Encounter (Signed)
 Pt is asking for a call to discuss a request that the Rx be changed to 5 a day so that he has an extra to prevent him from running out, please call pt to discuss this request.

## 2023-11-09 ENCOUNTER — Encounter: Payer: Self-pay | Admitting: Neurology

## 2023-11-09 ENCOUNTER — Ambulatory Visit: Admitting: Neurology

## 2023-11-09 VITALS — BP 118/71 | HR 81 | Ht 69.0 in | Wt 185.0 lb

## 2023-11-09 DIAGNOSIS — G7000 Myasthenia gravis without (acute) exacerbation: Secondary | ICD-10-CM

## 2023-11-09 DIAGNOSIS — R269 Unspecified abnormalities of gait and mobility: Secondary | ICD-10-CM | POA: Diagnosis not present

## 2023-11-09 DIAGNOSIS — Z79899 Other long term (current) drug therapy: Secondary | ICD-10-CM

## 2023-11-09 DIAGNOSIS — H532 Diplopia: Secondary | ICD-10-CM

## 2023-11-09 DIAGNOSIS — G4733 Obstructive sleep apnea (adult) (pediatric): Secondary | ICD-10-CM

## 2023-11-09 MED ORDER — MYCOPHENOLATE MOFETIL 250 MG PO CAPS: 250.0000 mg | ORAL_CAPSULE | Freq: Two times a day (BID) | ORAL | 3 refills | Status: AC

## 2023-11-09 MED ORDER — PYRIDOSTIGMINE BROMIDE 60 MG PO TABS: 60.0000 mg | ORAL_TABLET | Freq: Every day | ORAL | 11 refills | Status: AC

## 2023-11-09 NOTE — Addendum Note (Signed)
 Addended by: Hortensia Ma A on: 11/09/2023 01:35 PM   Modules accepted: Level of Service

## 2023-11-09 NOTE — Progress Notes (Addendum)
 [facility]  PATIENT: David [name]. DOB: [birth year]  REFERRING DOCTOR OR PCP:  Merri Brunette (PCP) and Jethro Bolus (Ophtho) SOURCE: Patient, records from Dr. Renne Crigler, Lab results, CPAP download  _________________________________   HISTORICAL  CHIEF COMPLAINT:  Chief Complaint  Patient presents with   Follow-up    Pt in room 10. Britta Mccreedy wife in room. Here for Myasthenia Gravis follow up. Pt reports doing well, needs refill on mycophenolate 250 mg. No concerns.     HISTORY OF PRESENT ILLNESS:  David [name]  is a 88 y.o. man with myasthenia gravis, and OSA.     Update 11/09/2023  He feels his MG is doing well.   He takes pyridostigmine 60 mg po qid (occasionally 5) and mycophenolate 250 mg po bid.  Strength is stable  No denies diplopia.  He did have blepharoplasty which helped ptosis.  Sometimes blinks a lot in stronger light.    No serious infections since his last visit.     He has no difficulty with dysphagia.   He has occasional diarrhea and takes Imodium 2-3 times a week  He enjoys traveling and going to shows. Was just u in a hot air balloon in [place].   He is sleeping well and stopped trazodone.    The desmopressin has helped his sleep by reducing the nocturia to just once most nights.     He lost 75 pounds and is now stable weight.   He no longer snores and stopped CPAP.  He is not interested in retesting a PSG.  The LBP is doing well- has not bothered him much in last 2 years.   He has severe spinal stenosis at L3-L4 and also has a pars defect with anterolisthesis at L5-S1. He did ESI in past.     MG History:   He had the onset of ptosis, while traveling in [place] and [place], about 3 weeks ago. He first noted the ptosis while trying to watch a movie in the bus (screen was up high) and noting that the eyelids were covering his vision.  He did note the trip was stressful and he and his wife both had injuries (he sprained ankle and wife  broke shoulder).   Lab work was performed and the acetylcholine binding antibodies were elevated at 1.85 (less than 0.24 normal). 57% were blocking antibodies and 25% or modulating antibodies.   Other lab work was noncontributory.     CT scan of the head showed age-related mild chronic microvascular ischemic change.      REVIEW OF SYSTEMS: Constitutional: No fevers, chills, sweats, or change in appetite Eyes: No visual changes, double vision, eye pain.   He notes some ptosis which reduces his visual fields superiorly Ear, nose and throat: No hearing loss, ear pain, nasal congestion, sore throat Cardiovascular: No chest pain, palpitations Respiratory:  No shortness of breath at rest or with exertion.   No wheezes.  He has OSA and is having trouble using CPAP. GastrointestinaI: No nausea, vomiting, diarrhea, abdominal pain, fecal incontinence Genitourinary:  No dysuria, urinary retention or frequency.  No nocturia. Musculoskeletal:  No neck pain, back pain Integumentary: No rashes. Neurological: as above Psychiatric: No depression at this time.  No anxiety Endocrine: No palpitations, diaphoresis, change in appetite, change in weigh or increased thirst Hematologic/Lymphatic:  No anemia, purpura, petechiae. Allergic/Immunologic: No itchy/runny eyes, nasal congestion, recent allergic reactions, rashes  ALLERGIES: Allergies  Allergen Reactions   Azithromycin Other (See Comments)   Clindamycin  Hcl Other (See Comments)   Doxycycline Diarrhea   Doxycycline Hyclate Other (See Comments)   Levofloxacin Other (See Comments)   Niacin Other (See Comments)   Gris-Peg [Griseofulvin] Other (See Comments)    headaches   Terbinafine Other (See Comments)    headache    HOME MEDICATIONS:  Current Outpatient Medications:    aspirin EC 81 MG tablet, Take 81 mg by mouth daily. , Disp: , Rfl:    carvedilol (COREG) 6.25 MG tablet, TAKE 1 TABLET(6.25 MG) BY MOUTH TWICE DAILY, Disp: 180 tablet, Rfl: 2    Cholecalciferol (VITAMIN D3) 50 MCG (2000 UT) CAPS, Take 1 capsule by mouth daily., Disp: , Rfl:    desmopressin (DDAVP) 0.1 MG tablet, Take 1 tablet (0.1 mg total) by mouth at bedtime. Call [phone] to schedule follow up for ongoing refills, Disp: 90 tablet, Rfl: 0   ezetimibe-simvastatin (VYTORIN) 10-40 MG tablet, Take 1 tablet by mouth daily., Disp: , Rfl:    JARDIANCE 10 MG TABS tablet, TAKE 1 TABLET BY MOUTH EVERY DAY, Disp: 90 tablet, Rfl: 3   loratadine (CLARITIN) 10 MG tablet, Take 10 mg by mouth daily., Disp: , Rfl:    losartan (COZAAR) 25 MG tablet, Take 1 tablet (25 mg total) by mouth daily., Disp: 90 tablet, Rfl: 0   mycophenolate (CELLCEPT) 250 MG capsule, TAKE 1 CAPSULE BY MOUTH TWICE DAILY, Disp: 180 capsule, Rfl: 0   MYRBETRIQ 50 MG TB24 tablet, Take 50 mg by mouth daily., Disp: , Rfl:    nitroGLYCERIN (NITROSTAT) 0.4 MG SL tablet, Place 1 tablet (0.4 mg total) under the tongue every 5 (five) minutes as needed for chest pain., Disp: 25 tablet, Rfl: 5   Polyethyl Glycol-Propyl Glycol (SYSTANE) 0.4-0.3 % SOLN, Place 1 drop into both eyes daily as needed (dry eyes)., Disp: , Rfl:    pyridostigmine (MESTINON) 60 MG tablet, TAKE 1 TABLET(60 MG) BY MOUTH FOUR TIMES DAILY, Disp: 120 tablet, Rfl: 0   spironolactone (ALDACTONE) 25 MG tablet, TAKE 1 TABLET(25 MG) BY MOUTH DAILY, Disp: 30 tablet, Rfl: 7   tamsulosin (FLOMAX) 0.4 MG CAPS capsule, Take 1 capsule (0.4 mg total) by mouth daily., Disp: 60 capsule, Rfl: 2   desmopressin (DDAVP) 0.1 MG tablet, TAKE 1 TABLET(100 MCG) BY MOUTH AT BEDTIME (Patient not taking: Reported on 11/09/2023), Disp: 90 tablet, Rfl: 1   furosemide (LASIX) 20 MG tablet, Take 1 tablet (20 mg total) by mouth as needed for edema (for weight gain of 2-3lb and ankle edema)., Disp: 30 tablet, Rfl: 11  PAST MEDICAL HISTORY: Past Medical History:  Diagnosis Date   Aortic insufficiency    Ascending aorta dilation (HCC)    Balance problem    CAD (coronary artery  disease)    previous stent to LAD 2003   Diabetes mellitus without complication (HCC)    No longer taking diabetic medications at this time   GI bleeding    HFrEF (heart failure with reduced ejection fraction) (HCC)    History of complete heart block    History of kidney stones    Hyperlipidemia    Hypertension    Iron deficiency anemia    LBBB (left bundle branch block)    Melanoma (HCC)    Myasthenia gravis (HCC)    eyelids, finger, and toes   NSVT (nonsustained ventricular tachycardia) (HCC)    OSA (obstructive sleep apnea)    Does not wear CPAP.   Presence of permanent cardiac pacemaker    S/P cardiac pacemaker procedure, placement 10/24/14  Medtronic Adapta L model ADDRL 1  10/25/2014   Spinal stenosis    Severe   Tricuspid regurgitation     PAST SURGICAL HISTORY: Past Surgical History:  Procedure Laterality Date   COLONOSCOPY     CORONARY ANGIOPLASTY WITH STENT PLACEMENT  03/01/2002   stenting to LAD - Dr. Aleen Campi   CYSTOSCOPY/URETEROSCOPY/HOLMIUM LASER/STENT PLACEMENT Left 08/31/2019   Procedure: CYSTOSCOPY/RETROGRADE/STENT PLACEMENT;  Surgeon: Rene Paci, MD;  Location: [facility];  Service: Urology;  Laterality: Left;  ONY NEEDS 45 MIN   NM MYOCAR PERF WALL MOTION  11/27/2010   normal   PERMANENT PACEMAKER INSERTION N/A 10/24/2014   Procedure: PERMANENT PACEMAKER INSERTION;  Surgeon: Hillis Range, MD;  Location: [facility] CATH LAB;  Service: Cardiovascular;  Laterality: N/A;   TONSILLECTOMY     TRANSURETHRAL RESECTION OF PROSTATE N/A 07/06/2019   Procedure: TRANSURETHRAL RESECTION OF THE PROSTATE (TURP), BIPOLAR;  Surgeon: Rene Paci, MD;  Location: [facility];  Service: Urology;  Laterality: N/A;   US ECHOCARDIOGRAPHY  12/05/2010   proximal septal thickening,mild concentric LVH,mild deptal hypokinesis,RV mildly dilated,LA mildly dilated,mild AI,moderate aortic root dilatation,septal motion c/w conduction abnormality     FAMILY HISTORY: Family History  Problem Relation Age of Onset   Breast cancer Mother    Colon cancer Father    Lung cancer Father     SOCIAL HISTORY:  Social History   Socioeconomic History   Marital status: Married    Spouse name: Not on file   Number of children: Not on file   Years of education: Not on file   Highest education level: Not on file  Occupational History   Not on file  Tobacco Use   Smoking status: Former    Current packs/day: 0.00    Types: Cigarettes, Cigars    Start date: 04/28/1949    Quit date: 10/27/1982    Years since quitting: 41.0   Smokeless tobacco: Never  Vaping Use   Vaping status: Never Used  Substance and Sexual Activity   Alcohol use: Not Currently    Comment: 1-2 drink per week   Drug use: No   Sexual activity: Not on file  Other Topics Concern   Not on file  Social History Narrative   Not on file   Social Drivers of Health   Financial Resource Strain: Not on file  Food Insecurity: Not on file  Transportation Needs: No Transportation Needs (07/29/2022)   PRAPARE - Administrator, Civil Service (Medical): No    Lack of Transportation (Non-Medical): No  Physical Activity: Not on file  Stress: Not on file  Social Connections: Not on file  Intimate Partner Violence: Not on file     PHYSICAL EXAM  Vitals:   11/09/23 1251  BP: 118/71  Pulse: 81  Weight: 185 lb (83.9 kg)  Height: 5\' 9"  (1.753 m)     Body mass index is 27.32 kg/m.     General: The patient is well-developed and well-nourished and in no acute distress   Neurologic Exam  Mental status: The patient is alert and oriented x 3 at the time of the examination. The patient has apparent normal recent and remote memory, with an apparently normal attention span and concentration ability.   Speech is normal.  Cranial nerves: Extraocular muscles were intact and no diplopia or ptosis even with prolonged upgaze. He has slightly reduced neck extension  strength.  The voice is strong.. Trapezius is strong.    No obvious  hearing deficits are noted.  Motor:  Muscle bulk is normal.   Tone is normal.Unable to stand up without using arms.   Strength is  5 / 5 in the arms shoulders and neck.  Strength is 4-/5 in the L5 innervated foot and ankle muscles on the left but 5/5 elsewhere in the left leg.  EHL is 4/5 right and 4+/5 left and he is 5/5 elsewhere on the right.     Sensory: He has reduced sensation in the left foot over the L5 dermatome.  Coordination: Cerebellar testing reveals good finger-nose-finger and mildly reduced left heel-to-shin .  Gait and station: Station is normal.  Stride is wide and gait is arthritic.  He can turn 180 degrees in 4  steps.Poor tandem gait.  Romberg is negative.  Reflexes: Deep tendon reflexes are symmetric and normal in the arms and knees but absent at the ankles    DIAGNOSTIC DATA (LABS, IMAGING, TESTING) - I reviewed patient records, labs, notes, testing and imaging myself where available.  Lab Results  Component Value Date   WBC 5.7 02/11/2023   HGB 14.9 02/11/2023   HCT 44.4 02/11/2023   MCV 96 02/11/2023   PLT 201 02/11/2023      Component Value Date/Time   NA 141 02/11/2023 1134   K 4.4 02/11/2023 1134   CL 103 02/11/2023 1134   CO2 26 02/11/2023 1134   GLUCOSE 89 02/11/2023 1134   GLUCOSE 84 03/22/2021 0321   BUN 19 02/11/2023 1134   CREATININE 0.63 (L) 02/11/2023 1134   CALCIUM 8.8 02/11/2023 1134   PROT 6.1 02/11/2023 1134   ALBUMIN 4.0 02/11/2023 1134   AST 18 02/11/2023 1134   ALT 16 02/11/2023 1134   ALKPHOS 55 02/11/2023 1134   BILITOT 1.1 02/11/2023 1134   GFRNONAA >60 03/22/2021 0321   GFRAA 105 11/17/2019 1419   Lab Results  Component Value Date   CHOL 136 12/24/2022   HDL 55 12/24/2022   LDLCALC 67 12/24/2022   TRIG 69 12/24/2022   CHOLHDL 2.5 12/24/2022   Lab Results  Component Value Date   HGBA1C 6.6 (H) 10/24/2014   Lab Results  Component Value Date    VITAMINB12 411 05/08/2021   Lab Results  Component Value Date   TSH 1.780 11/20/2020       ASSESSMENT AND PLAN    1. Myasthenia gravis (HCC)   2. High risk medication use   3. Gait abnormality   4. OSA (obstructive sleep apnea)   5. Diplopia     1.. Continue Mestinon 60 mg po qid to 5 x d and CellCept 250 mg po bid.   If MG flares up, consider IVIg or other agent and increase Cellcept to 500 mg po bid.     Higher dose of Mestinon caused GI issues 2.  Continue desmopressin for nocturia.     3.   Ok to remain off CPAP as lost weight.  If he gains it back or snoring increases, should go back on.   He prefers not to do another sleep study to determine if he still has OSA. 4.   He will return to see me in 5-6 months but call sooner if he has new or worsening neurologic symptoms.    This visit is part of a comprehensive longitudinal care medical relationship regarding the patients primary diagnosis of myasthenia gravis and related concerns.   Kenedy Haisley A. Godwin Lat, MD, PhD 11/09/2023, 1:19 PM Certified in Neurology, Clinical Neurophysiology, Sleep Medicine, Pain Medicine  and Neuroimaging  [facility] [address] [place], [place] [address] [phone]

## 2023-11-10 ENCOUNTER — Encounter: Payer: Self-pay | Admitting: Neurology

## 2023-11-10 LAB — COMPREHENSIVE METABOLIC PANEL WITH GFR
ALT: 11 IU/L (ref 0–44)
AST: 16 IU/L (ref 0–40)
Albumin: 4.2 g/dL (ref 3.7–4.7)
Alkaline Phosphatase: 62 IU/L (ref 44–121)
BUN/Creatinine Ratio: 22 (ref 10–24)
BUN: 16 mg/dL (ref 8–27)
Bilirubin Total: 1.3 mg/dL — ABNORMAL HIGH (ref 0.0–1.2)
CO2: 25 mmol/L (ref 20–29)
Calcium: 8.7 mg/dL (ref 8.6–10.2)
Chloride: 103 mmol/L (ref 96–106)
Creatinine, Ser: 0.73 mg/dL — ABNORMAL LOW (ref 0.76–1.27)
Globulin, Total: 2.1 g/dL (ref 1.5–4.5)
Glucose: 85 mg/dL (ref 70–99)
Potassium: 4.3 mmol/L (ref 3.5–5.2)
Sodium: 140 mmol/L (ref 134–144)
Total Protein: 6.3 g/dL (ref 6.0–8.5)
eGFR: 89 mL/min/{1.73_m2} (ref >59)

## 2023-11-10 LAB — CBC WITH DIFFERENTIAL/PLATELET
Basophils Absolute: 0 10*3/uL (ref 0.0–0.2)
Basos: 0 %
EOS (ABSOLUTE): 0.1 10*3/uL (ref 0.0–0.4)
Eos: 2 %
Hematocrit: 46.4 % (ref 37.5–51.0)
Hemoglobin: 15.1 g/dL (ref 13.0–17.7)
Immature Grans (Abs): 0 10*3/uL (ref 0.0–0.1)
Immature Granulocytes: 0 %
Lymphocytes Absolute: 1.9 10*3/uL (ref 0.7–3.1)
Lymphs: 31 %
MCH: 30.8 pg (ref 26.6–33.0)
MCHC: 32.5 g/dL (ref 31.5–35.7)
MCV: 95 fL (ref 79–97)
Monocytes Absolute: 0.6 10*3/uL (ref 0.1–0.9)
Monocytes: 10 %
Neutrophils Absolute: 3.5 10*3/uL (ref 1.4–7.0)
Neutrophils: 57 %
Platelets: 183 10*3/uL (ref 150–450)
RBC: 4.9 x10E6/uL (ref 4.14–5.80)
RDW: 12.4 % (ref 11.6–15.4)
WBC: 6 10*3/uL (ref 3.4–10.8)

## 2023-11-23 DIAGNOSIS — S70362A Insect bite (nonvenomous), left thigh, initial encounter: Secondary | ICD-10-CM | POA: Diagnosis not present

## 2023-11-23 DIAGNOSIS — W57XXXA Bitten or stung by nonvenomous insect and other nonvenomous arthropods, initial encounter: Secondary | ICD-10-CM | POA: Diagnosis not present

## 2023-11-29 ENCOUNTER — Other Ambulatory Visit: Payer: Self-pay | Admitting: Neurology

## 2023-11-30 NOTE — Telephone Encounter (Signed)
 Last seen on 11/09/23 per note " Continue desmopressin  for nocturia  Follow up scheduled on 06/16/24

## 2023-12-06 ENCOUNTER — Other Ambulatory Visit: Payer: Self-pay | Admitting: Cardiology

## 2024-01-03 NOTE — Progress Notes (Unsigned)
 David L Laury Portela., male    DOB: 1936/02/21    MRN: [id number]   Brief patient profile:  65 yowm quit smoking 1984 (mostly)  s apparent resp sequelae  referred to pulmonary clinic in   05/06/2023 by Dr Schuyler Custard  for recurrent cough  x  01/2023 asosoc with pnds x 2014.    History of Present Illness  05/06/2023  Pulmonary/ 1st office eval/ David [name] /  Office  Chief Complaint  Patient presents with   Establish Care   Cough  Dyspnea:  limited by weak legs / balance issues > sob  slow pace - usually stays in car  when goes anywhere while fm goes in. Cough: white mucus x one tbsp ended 05/02/23 s obvious reason   Sleep: bed is flat/ one pillow on R side s noct cc  SABA use: none  02: none  Says swallowing fine now but occ chokes on food / daughter is former  ST Pnds x 10 years > allergy testing > ? [place]  "pos tomatoes" No pets at home  Rec Try off clariton and only take as needed for itchy/ sneezy / runny nose worse than you notice when you don't take it  Make sure you check your oxygen saturation at your highest level of activity(NOT after you stop)  to be sure it stays over 90% and keep track of it at least once a week, more often if breathing getting worse, and let me know if losing ground. (Collect the dots to connect the dots approach - once a week) You may need another formal speech/swallowing  eval but I am deferring this to your daughter and Dr Schuyler Custard  Please schedule a follow up visit in 3 months but call sooner if needed  [facility]) bring all medications - including medications    Cxr c/w ILD  05/06/23    08/20/2023  f/u ov/David [name] re:  recurrent cough  x  01/2023  assoc with pnds x 2014.     Chief Complaint  Patient presents with   Follow-up    Gaining weight.  Breathing is good.  Dyspnea:  very sedentary  Cough: dry/ sporadic/ no assoc with meals Sleeping: flat bed / no resp cc  SABA use: not helpful Rec Make sure you check your oxygen saturation at your  highest level of activity(NOT after you stop)  to be sure it stays over 90%    01/05/2024  f/u ov/David [name] re: ? ILD    maint on no resp rx   Chief Complaint  Patient presents with   Follow-up    D.O.E f/u.   Dyspnea:  Not limited by breathing from desired activities   Cough: gone to his satisfaction/ assoc with mild pnds persists assoc with obvious daytime watery rhinorrhea   Sleeping: flat bed  one pillow s noct or early am  resp cc  SABA use: none  02: none     No obvious day to day or daytime variability or assoc excess/ purulent sputum or mucus plugs or hemoptysis or cp or chest tightness, subjective wheeze or overt hb symptoms.    Also denies any obvious fluctuation of symptoms with weather or environmental changes or other aggravating or alleviating factors except as outlined above   No unusual exposure hx or h/o childhood pna/ asthma or knowledge of premature birth.  Current Allergies, Complete Past Medical History, Past Surgical History, Family History, and Social History were reviewed in [name] record.  ROS  The following  are not active complaints unless bolded Hoarseness, sore throat, dysphagia, dental problems, itching, sneezing,  nasal congestion or discharge of excess mucus or purulent secretions, ear ache,   fever, chills, sweats, unintended wt loss or wt gain, classically pleuritic or exertional cp,  orthopnea pnd or arm/hand swelling  or leg swelling, presyncope, palpitations, abdominal pain, anorexia, nausea, vomiting, diarrhea  or change in bowel habits or change in bladder habits, change in stools or change in urine, dysuria, hematuria,  rash, arthralgias, visual complaints, headache, numbness, weakness or ataxia or problems with walking or coordination,  change in mood or  memory.        Current Meds  Medication Sig   aspirin  EC 81 MG tablet Take 81 mg by mouth daily.    carvedilol  (COREG ) 6.25 MG tablet TAKE 1 TABLET(6.25 MG) BY MOUTH TWICE  DAILY   Cholecalciferol (VITAMIN D3) 50 MCG (2000 UT) CAPS Take 1 capsule by mouth daily.   desmopressin  (DDAVP ) 0.1 MG tablet TAKE 1 TABLET(0.1 MG TABLET) BY MOUTH AT BEDTIME. CALL [phone] TO SCHEDULE FOLLOW UP FOR ONGOING REFILLS.   ezetimibe -simvastatin  (VYTORIN ) 10-40 MG tablet Take 1 tablet by mouth daily.   JARDIANCE  10 MG TABS tablet TAKE 1 TABLET BY MOUTH EVERY DAY   loratadine  (CLARITIN ) 10 MG tablet Take 10 mg by mouth daily.   losartan  (COZAAR ) 25 MG tablet Take 1 tablet (25 mg total) by mouth daily.   mycophenolate  (CELLCEPT ) 250 MG capsule Take 1 capsule (250 mg total) by mouth 2 (two) times daily.   MYRBETRIQ  50 MG TB24 tablet Take 50 mg by mouth daily.   nitroGLYCERIN  (NITROSTAT ) 0.4 MG SL tablet Place 1 tablet (0.4 mg total) under the tongue every 5 (five) minutes as needed for chest pain.   Polyethyl Glycol-Propyl Glycol (SYSTANE) 0.4-0.3 % SOLN Place 1 drop into both eyes daily as needed (dry eyes).   pyridostigmine  (MESTINON ) 60 MG tablet Take 1 tablet (60 mg total) by mouth 5 (five) times daily.   spironolactone  (ALDACTONE ) 25 MG tablet TAKE 1 TABLET(25 MG) BY MOUTH DAILY   tamsulosin  (FLOMAX ) 0.4 MG CAPS capsule Take 1 capsule (0.4 mg total) by mouth daily.            Past Medical History:  Diagnosis Date   Aortic insufficiency    Ascending aorta dilation (HCC)    Balance problem    CAD (coronary artery disease)    previous stent to LAD 2003   Diabetes mellitus without complication (HCC)    No longer taking diabetic medications at this time   GI bleeding    HFrEF (heart failure with reduced ejection fraction) (HCC)    History of complete heart block    History of kidney stones    Hyperlipidemia    Hypertension    Iron deficiency anemia    LBBB (left bundle branch block)    Melanoma (HCC)    Myasthenia gravis (HCC)    eyelids, finger, and toes   NSVT (nonsustained ventricular tachycardia) (HCC)    OSA (obstructive sleep apnea)    Does not wear CPAP.    Presence of permanent cardiac pacemaker    S/P cardiac pacemaker procedure, placement 10/24/14 Medtronic Adapta L model ADDRL 1  10/25/2014   Spinal stenosis    Severe   Tricuspid regurgitation       Objective:    Wts   01/05/2024       186    08/20/23 185 lb 9.6 oz (84.2 kg)  05/06/23 173 lb (  78.5 kg)  02/11/23 164 lb 8 oz (74.6 kg)    Vital signs reviewed  01/05/2024  - Note at rest 02 sats  99% on RA   General appearance:    amb wm nad    HEENT : Oropharynx  clear     Nasal turbinates minimal watery secretions/ no polyps or cyanosis    NECK :  without  apparent JVD/ palpable Nodes/TM    LUNGS: no acc muscle use,  Mod kyphotic contour chest which is clear to A and P bilaterally without cough on insp or exp maneuvers   CV:  RRR  no s3  with 2/6 diastolic murmur s increase in P2, and no edema   ABD:  soft and nontender   MS:     ext warm without deformities Or obvious joint restrictions  calf tenderness, cyanosis or clubbing    SKIN: warm and dry without lesions    NEURO:  alert, approp, nl sensorium with  no motor or cerebellar deficits apparent.        CXR PA and Lateral:   01/05/2024 :    I personally reviewed images and impression is as follows:     Kyphosis/ mild ILD no def progression    Assessment

## 2024-01-04 DIAGNOSIS — M792 Neuralgia and neuritis, unspecified: Secondary | ICD-10-CM | POA: Diagnosis not present

## 2024-01-04 DIAGNOSIS — C44222 Squamous cell carcinoma of skin of right ear and external auricular canal: Secondary | ICD-10-CM | POA: Diagnosis not present

## 2024-01-04 DIAGNOSIS — L820 Inflamed seborrheic keratosis: Secondary | ICD-10-CM | POA: Diagnosis not present

## 2024-01-04 DIAGNOSIS — C4442 Squamous cell carcinoma of skin of scalp and neck: Secondary | ICD-10-CM | POA: Diagnosis not present

## 2024-01-05 ENCOUNTER — Ambulatory Visit

## 2024-01-05 ENCOUNTER — Encounter: Payer: Self-pay | Admitting: Internal Medicine

## 2024-01-05 ENCOUNTER — Ambulatory Visit: Admitting: Internal Medicine

## 2024-01-05 VITALS — BP 118/62 | HR 63 | Temp 97.6°F | Ht 69.0 in | Wt 186.0 lb

## 2024-01-05 DIAGNOSIS — Z87891 Personal history of nicotine dependence: Secondary | ICD-10-CM

## 2024-01-05 DIAGNOSIS — R918 Other nonspecific abnormal finding of lung field: Secondary | ICD-10-CM | POA: Diagnosis not present

## 2024-01-05 DIAGNOSIS — R058 Other specified cough: Secondary | ICD-10-CM | POA: Diagnosis not present

## 2024-01-05 DIAGNOSIS — R0609 Other forms of dyspnea: Secondary | ICD-10-CM | POA: Diagnosis not present

## 2024-01-05 DIAGNOSIS — J300 Vasomotor rhinitis: Secondary | ICD-10-CM

## 2024-01-05 MED ORDER — IPRATROPIUM BROMIDE 0.06 % NA SOLN: NASAL | 12 refills | Status: DC

## 2024-01-05 NOTE — Assessment & Plan Note (Signed)
 Trial of atovent  0.6 NS 01/05/2024 >>>   F/u end prn  Each maintenance medication was reviewed in detail including emphasizing most importantly the difference between maintenance and prns and under what circumstances the prns are to be triggered using an action plan format where appropriate.  Total time for H and P, chart review, counseling, reviewing nasal device(s) , directly observing portions of ambulatory 02 saturation study/ and generating customized AVS unique to this office visit / same day charting = 34 min

## 2024-01-05 NOTE — Patient Instructions (Addendum)
 Atrovent nasal spray  up to 2 puffs  every 6 hours as needed  Make sure you check your oxygen saturation at your highest level of activity(NOT after you stop)  to be sure it stays over 90% and keep track of it at least once a week, more often if breathing getting worse, and let me know if losing ground. (Collect the dots to connect the dots approach)    Please remember to go to the  x-ray department  for your tests - we will call you with the results when they are available     Please schedule a follow up visit in 6  months but call sooner if needed

## 2024-01-05 NOTE — Assessment & Plan Note (Signed)
 Doe/ cough x April 2024  - echo 09/03/22  EF 40-35% mod AI  - 05/06/2023   Walked on RA  x  1  lap(s) =  approx 150  ft  @ nl  pace, stopped due to unsteady on feet  with lowest 02 sats 94%  - 08/20/2023   Walked on RA  x  one  lap(s) =  approx 250  ft  @ slow/cane pace, stopped due to fatigue  with lowest 02 sats 95%   - 01/05/2024   Walked on RA  x  3  lap(s) =  approx 750  ft  @ slow pace, stopped due to end of study with lowest 02 sats 95% and no sob    No clinical evidence of significant progression by ILD so no change in recs:  Make sure you check your oxygen saturation at your highest level of activity(NOT after you stop)  to be sure it stays over 90% and keep track of it at least once a week, more often if breathing getting worse, and let me know if losing ground. (Collect the dots to connect the dots approach)    F/u q 6 m, sooner prn

## 2024-01-08 ENCOUNTER — Ambulatory Visit: Payer: Self-pay | Admitting: Internal Medicine

## 2024-01-11 ENCOUNTER — Other Ambulatory Visit: Payer: Self-pay | Admitting: Cardiology

## 2024-01-11 DIAGNOSIS — I5022 Chronic systolic (congestive) heart failure: Secondary | ICD-10-CM

## 2024-01-11 NOTE — Progress Notes (Signed)
Spoke with pt and notified of results per Dr. Wert. Pt verbalized understanding and denied any questions. 

## 2024-01-12 ENCOUNTER — Ambulatory Visit (INDEPENDENT_AMBULATORY_CARE_PROVIDER_SITE_OTHER): Payer: Self-pay

## 2024-01-12 DIAGNOSIS — I442 Atrioventricular block, complete: Secondary | ICD-10-CM

## 2024-01-12 LAB — CUP PACEART REMOTE DEVICE CHECK
Battery Impedance: 1850 Ohm
Battery Remaining Longevity: 36 mo
Battery Voltage: 2.74 V
Brady Statistic AP VP Percent: 35 %
Brady Statistic AP VS Percent: 0 %
Brady Statistic AS VP Percent: 55 %
Brady Statistic AS VS Percent: 10 %
Date Time Interrogation Session: 20250616103521
Implantable Lead Connection Status: 753985
Implantable Lead Connection Status: 753985
Implantable Lead Implant Date: 20160329
Implantable Lead Implant Date: 20160329
Implantable Lead Location: 753859
Implantable Lead Location: 753860
Implantable Lead Model: 5076
Implantable Lead Model: 5092
Implantable Pulse Generator Implant Date: 20160329
Lead Channel Impedance Value: 404 Ohm
Lead Channel Impedance Value: 562 Ohm
Lead Channel Pacing Threshold Amplitude: 0.5 V
Lead Channel Pacing Threshold Amplitude: 1.125 V
Lead Channel Pacing Threshold Pulse Width: 0.4 ms
Lead Channel Pacing Threshold Pulse Width: 0.4 ms
Lead Channel Setting Pacing Amplitude: 2 V
Lead Channel Setting Pacing Amplitude: 2.5 V
Lead Channel Setting Pacing Pulse Width: 0.4 ms
Lead Channel Setting Sensing Sensitivity: 2.8 mV
Zone Setting Status: 755011
Zone Setting Status: 755011

## 2024-01-14 ENCOUNTER — Ambulatory Visit: Payer: Self-pay | Admitting: Cardiology

## 2024-01-19 ENCOUNTER — Other Ambulatory Visit: Payer: Self-pay | Admitting: Cardiology

## 2024-01-23 ENCOUNTER — Other Ambulatory Visit: Payer: Self-pay | Admitting: Cardiology

## 2024-01-23 DIAGNOSIS — I5022 Chronic systolic (congestive) heart failure: Secondary | ICD-10-CM

## 2024-01-24 ENCOUNTER — Other Ambulatory Visit: Payer: Self-pay | Admitting: Cardiology

## 2024-01-27 NOTE — Progress Notes (Signed)
 HPI: Follow-up coronary artery disease, CHF, hypertension and history of complete heart block status post pacemaker. Patient had PCI of his LAD in 2003.  He had a pacemaker placed in 2016.  Echocardiogram March 2016 showed normal LV function, moderate left ventricular hypertrophy, grade 1 diastolic dysfunction, mild aortic insufficiency, mildly dilated aortic root, moderate tricuspid regurgitation.  Nuclear study September 2016 showed ejection fraction 43%, inferior thinning but no ischemia.  Echocardiogram repeated in August 2022 and showed ejection fraction 20 to 25%, mild left ventricular enlargement, mild left ventricular hypertrophy, grade 1 diastolic dysfunction, mild right ventricular enlargement, moderate left atrial enlargement, severe right atrial enlargement, mild mitral regurgitation, moderate tricuspid regurgitation, moderate to severe aortic insufficiency, dilated ascending aorta measuring 45 mm.  At previous office visit patient was volume overloaded and admitted for IV diuresis and medication adjustment.  Patient was noted to be anemic; placed on PPI and GI recommended no further evaluation.  Plan initially was for cardiac catheterization given reduced LV function but given age and comorbidities medical therapy was pursued.  Following discharge he was readmitted with low blood pressure and Entresto  was discontinued. CTA October 2022 showed 44 mm ascending thoracic aortic aneurysm. Echocardiogram repeated February 2024 and showed ejection fraction 40 to 45%, moderate aortic insufficiency, moderately dilated ascending aorta at 45 mm.  Since last seen patient denies dyspnea, chest pain, palpitations, syncope or pedal edema.  He has some unsteadiness and has fallen occasionally in the past.  Current Outpatient Medications  Medication Sig Dispense Refill   aspirin  EC 81 MG tablet Take 81 mg by mouth daily.      carvedilol  (COREG ) 6.25 MG tablet TAKE 1 TABLET(6.25 MG) BY MOUTH TWICE DAILY 60  tablet 1   Cholecalciferol (VITAMIN D3) 50 MCG (2000 UT) CAPS Take 1 capsule by mouth daily.     desmopressin  (DDAVP ) 0.1 MG tablet TAKE 1 TABLET(0.1 MG TABLET) BY MOUTH AT BEDTIME. CALL [phone] TO SCHEDULE FOLLOW UP FOR ONGOING REFILLS. 90 tablet 1   empagliflozin  (JARDIANCE ) 10 MG TABS tablet TAKE 1 TABLET BY MOUTH EVERY DAY 30 tablet 0   ezetimibe -simvastatin  (VYTORIN ) 10-40 MG tablet Take 1 tablet by mouth daily.     ipratropium (ATROVENT ) 0.06 % nasal spray Up to 2 puffs every 6 hours as needed 15 mL 12   loratadine  (CLARITIN ) 10 MG tablet Take 10 mg by mouth daily.     losartan  (COZAAR ) 25 MG tablet TAKE 1 TABLET(25 MG) BY MOUTH DAILY 30 tablet 0   mycophenolate  (CELLCEPT ) 250 MG capsule Take 1 capsule (250 mg total) by mouth 2 (two) times daily. 180 capsule 3   Polyethyl Glycol-Propyl Glycol (SYSTANE) 0.4-0.3 % SOLN Place 1 drop into both eyes daily as needed (dry eyes).     pyridostigmine  (MESTINON ) 60 MG tablet Take 1 tablet (60 mg total) by mouth 5 (five) times daily. 150 tablet 11   spironolactone  (ALDACTONE ) 25 MG tablet TAKE 1 TABLET(25 MG) BY MOUTH DAILY 30 tablet 0   tamsulosin  (FLOMAX ) 0.4 MG CAPS capsule Take 1 capsule (0.4 mg total) by mouth daily. 60 capsule 2   furosemide  (LASIX ) 20 MG tablet Take 1 tablet (20 mg total) by mouth as needed for edema (for weight gain of 2-3lb and ankle edema). (Patient not taking: Reported on 02/04/2024) 30 tablet 11   MYRBETRIQ  50 MG TB24 tablet Take 50 mg by mouth daily.     nitroGLYCERIN  (NITROSTAT ) 0.4 MG SL tablet Place 1 tablet (0.4 mg total) under the tongue every  5 (five) minutes as needed for chest pain. (Patient not taking: Reported on 02/04/2024) 25 tablet 5   No current facility-administered medications for this visit.     Past Medical History:  Diagnosis Date   Aortic insufficiency    Ascending aorta dilation (HCC)    Balance problem    CAD (coronary artery disease)    previous stent to LAD 2003   Diabetes mellitus  without complication (HCC)    No longer taking diabetic medications at this time   GI bleeding    HFrEF (heart failure with reduced ejection fraction) (HCC)    History of complete heart block    History of kidney stones    Hyperlipidemia    Hypertension    Iron deficiency anemia    LBBB (left bundle branch block)    Melanoma (HCC)    Myasthenia gravis (HCC)    eyelids, finger, and toes   NSVT (nonsustained ventricular tachycardia) (HCC)    OSA (obstructive sleep apnea)    Does not wear CPAP.   Presence of permanent cardiac pacemaker    S/P cardiac pacemaker procedure, placement 10/24/14 Medtronic Adapta L model ADDRL 1  10/25/2014   Spinal stenosis    Severe   Tricuspid regurgitation     Past Surgical History:  Procedure Laterality Date   COLONOSCOPY     CORONARY ANGIOPLASTY WITH STENT PLACEMENT  03/01/2002   stenting to LAD - Dr. Bulah   CYSTOSCOPY/URETEROSCOPY/HOLMIUM LASER/STENT PLACEMENT Left 08/31/2019   Procedure: CYSTOSCOPY/RETROGRADE/STENT PLACEMENT;  Surgeon: Devere Lonni Righter, MD;  Location: [facility];  Service: Urology;  Laterality: Left;  ONY NEEDS 45 MIN   NM MYOCAR PERF WALL MOTION  11/27/2010   normal   PERMANENT PACEMAKER INSERTION N/A 10/24/2014   Procedure: PERMANENT PACEMAKER INSERTION;  Surgeon: Lynwood Rakers, MD;  Location: [facility] C[facility];  Service: Cardiovascular;  Laterality: N/A;   TONSILLECTOMY     TRANSURETHRAL RESECTION OF PROSTATE N/A 07/06/2019   Procedure: TRANSURETHRAL RESECTION OF THE PROSTATE (TURP), BIPOLAR;  Surgeon: Devere Lonni Righter, MD;  Location: [facility];  Service: Urology;  Laterality: N/A;   US  ECHOCARDIOGRAPHY  12/05/2010   proximal septal thickening,mild concentric LVH,mild deptal hypokinesis,RV mildly dilated,LA mildly dilated,mild AI,moderate aortic root dilatation,septal motion c/w conduction abnormality    Social History   Socioeconomic History   Marital status: Married    Spouse name:  Not on file   Number of children: Not on file   Years of education: Not on file   Highest education level: Not on file  Occupational History   Not on file  Tobacco Use   Smoking status: Former    Current packs/day: 0.00    Types: Cigarettes, Cigars    Start date: 04/28/1949    Quit date: 10/27/1982    Years since quitting: 41.3   Smokeless tobacco: Never  Vaping Use   Vaping status: Never Used  Substance and Sexual Activity   Alcohol  use: Not Currently    Comment: 1-2 drink per week   Drug use: No   Sexual activity: Not on file  Other Topics Concern   Not on file  Social History Narrative   Not on file   Social Drivers of Health   Financial Resource Strain: Not on file  Food Insecurity: Not on file  Transportation Needs: No Transportation Needs (07/29/2022)   PRAPARE - Administrator, Civil Service (Medical): No    Lack of Transportation (Non-Medical): No  Physical Activity: Not on  file  Stress: Not on file  Social Connections: Not on file  Intimate Partner Violence: Not on file    Family History  Problem Relation Age of Onset   Breast cancer Mother    Colon cancer Father    Lung cancer Father     ROS: no fevers or chills, productive cough, hemoptysis, dysphasia, odynophagia, melena, hematochezia, dysuria, hematuria, rash, seizure activity, orthopnea, PND, pedal edema, claudication. Remaining systems are negative.  Physical Exam: Well-developed well-nourished in no acute distress.  Skin is warm and dry.  HEENT is normal.  Neck is supple.  Chest is clear to auscultation with normal expansion.  Cardiovascular exam is regular rate and rhythm.  Abdominal exam nontender or distended. No masses palpated. Extremities show no edema. neuro grossly intact  EKG Interpretation Date/Time:  Thursday February 04 2024 11:55:03 EDT Ventricular Rate:  81 PR Interval:    QRS Duration:  182 QT Interval:  420 QTC Calculation: 487 R Axis:   -74  Text  Interpretation: Ventricular-paced rhythm Confirmed by Pietro Rogue ([id number]) on 02/04/2024 12:03:01 PM    A/P  1 chronic combined systolic/diastolic congestive heart failure-patient remains euvolemic on examination.  Continue present dose of Jardiance , spironolactone  and Lasix .  Check potassium and renal function.  2 cardiomyopathy-LV function is mildly reduced on most recent echocardiogram.  This is felt secondary to dyssynchrony/left bundle branch block versus ischemia mediated.  Continue losartan  (he did not tolerate Entresto  previously) and carvedilol .  I previously considered cardiac catheterization but given his multiple comorbidities we elected to treat medically and he has done very well.  3 thoracic aortic aneurysm-this was unchanged on most recent echocardiogram.  Will repeat study though I doubt he would be a candidate for aortic root replacement.  4 coronary artery disease-continue aspirin  and statin.  5 aortic insufficiency-we will plan follow-up echocardiogram.  6 hypertension-blood pressure controlled.  Continue present medications.  7 hyperlipidemia-continue Vytorin .  Check lipids and liver.  8 history of pacemaker-managed by electrophysiology.  9 persistent atrial fibrillation-patient is a newly diagnosed atrial fibrillation by interrogating his pacemaker.  However he is asymptomatic.  Will add apixaban  5 mg twice daily and discontinue aspirin .  I will have him seen back by an APP in 4 weeks.  If atrial fibrillation persist would then begin amiodarone followed by cardioversion 4 to 6 weeks later to see if he will hold sinus rhythm.  Will then need to consider whether anticoagulation should be continued long-term as he is unsteady on his feet.  Could also consider referral for watchman.  Check hemoglobin.  Rogue Pietro, MD

## 2024-02-04 ENCOUNTER — Encounter: Payer: Self-pay | Admitting: Cardiology

## 2024-02-04 ENCOUNTER — Ambulatory Visit: Attending: Cardiology | Admitting: Cardiology

## 2024-02-04 ENCOUNTER — Telehealth: Payer: Self-pay | Admitting: *Deleted

## 2024-02-04 ENCOUNTER — Other Ambulatory Visit (HOSPITAL_COMMUNITY): Payer: Self-pay

## 2024-02-04 VITALS — BP 110/60 | HR 81 | Ht 69.0 in | Wt 186.0 lb

## 2024-02-04 DIAGNOSIS — I10 Essential (primary) hypertension: Secondary | ICD-10-CM

## 2024-02-04 DIAGNOSIS — I4819 Other persistent atrial fibrillation: Secondary | ICD-10-CM

## 2024-02-04 DIAGNOSIS — I351 Nonrheumatic aortic (valve) insufficiency: Secondary | ICD-10-CM

## 2024-02-04 DIAGNOSIS — I25118 Atherosclerotic heart disease of native coronary artery with other forms of angina pectoris: Secondary | ICD-10-CM

## 2024-02-04 DIAGNOSIS — I442 Atrioventricular block, complete: Secondary | ICD-10-CM | POA: Diagnosis not present

## 2024-02-04 DIAGNOSIS — I5022 Chronic systolic (congestive) heart failure: Secondary | ICD-10-CM | POA: Diagnosis not present

## 2024-02-04 DIAGNOSIS — I2510 Atherosclerotic heart disease of native coronary artery without angina pectoris: Secondary | ICD-10-CM

## 2024-02-04 MED ORDER — APIXABAN 5 MG PO TABS: 5.0000 mg | ORAL_TABLET | Freq: Two times a day (BID) | ORAL | 6 refills | Status: DC

## 2024-02-04 NOTE — Telephone Encounter (Signed)
 Patient seen today outside of device clinic at the request of Dr. Pietro to determine if patient was in atrial fibrillation. Programmer was used to connect with patient's device and the patient's presenting EGM was AS/VP, and the rate and regularity was indeed c/w atrial fibrillation. Device records indicated Atrial Arrhythmia Trend : 165 days with >4 hours AT/AF. Dr. Pietro was present in the room and is aware. Full device interrogation was not performed today. No charge for check. See report in [facility] for further details.

## 2024-02-04 NOTE — Patient Instructions (Signed)
 Medication Instructions:   START ELIQUIS  5 MG ONE TABLET TWICE DAILY  *If you need a refill on your cardiac medications before your next appointment, please call your pharmacy*   Follow-Up: At [facility], you and your health needs are our priority.  As part of our continuing mission to provide you with exceptional heart care, our providers are all part of one team.  This team includes your primary Cardiologist (physician) and Advanced Practice Providers or APPs (Physician Assistants and Nurse Practitioners) who all work together to provide you with the care you need, when you need it.  Your next appointment:   4 week(s)  Provider:   One of our Advanced Practice Providers (APPs): Morse Clause, PA-C  Lamarr Satterfield, NP Miriam Shams, NP  Olivia Pavy, PA-C Josefa Beauvais, NP  Leontine Salen, PA-C Orren Fabry, PA-C  McComb, PA-C Ernest Dick, NP  Damien Braver, NP Jon Hails, PA-C  Waddell Donath, PA-C    Dayna Dunn, PA-C  Scott Weaver, PA-C Lum Louis, NP Katlyn West, NP Callie Goodrich, PA-C  Evan Williams, PA-C Sheng Haley, PA-C  Xika Zhao, NP Kathleen Johnson, PA-C   Then, Redell Shallow, MD will plan to see you again in 4 month(s).

## 2024-02-09 ENCOUNTER — Telehealth: Payer: Self-pay | Admitting: Neurology

## 2024-02-09 DIAGNOSIS — K080 Exfoliation of teeth due to systemic causes: Secondary | ICD-10-CM | POA: Diagnosis not present

## 2024-02-09 NOTE — Telephone Encounter (Signed)
 Below noted.

## 2024-02-09 NOTE — Telephone Encounter (Signed)
 Pt has called to report that David Redell RAMAN, MD  has replaced his daily aspirin  with  apixaban  (ELIQUIS ) 5 MG TABS tablet

## 2024-02-16 ENCOUNTER — Other Ambulatory Visit: Payer: Self-pay | Admitting: Cardiology

## 2024-02-16 DIAGNOSIS — I5022 Chronic systolic (congestive) heart failure: Secondary | ICD-10-CM

## 2024-02-19 ENCOUNTER — Other Ambulatory Visit: Payer: Self-pay | Admitting: Cardiology

## 2024-02-19 DIAGNOSIS — N401 Enlarged prostate with lower urinary tract symptoms: Secondary | ICD-10-CM | POA: Diagnosis not present

## 2024-02-19 DIAGNOSIS — R350 Frequency of micturition: Secondary | ICD-10-CM | POA: Diagnosis not present

## 2024-02-19 DIAGNOSIS — I5022 Chronic systolic (congestive) heart failure: Secondary | ICD-10-CM

## 2024-02-26 ENCOUNTER — Other Ambulatory Visit: Payer: Self-pay | Admitting: Cardiology

## 2024-03-01 DIAGNOSIS — K080 Exfoliation of teeth due to systemic causes: Secondary | ICD-10-CM | POA: Diagnosis not present

## 2024-03-02 ENCOUNTER — Other Ambulatory Visit: Payer: Self-pay

## 2024-03-02 ENCOUNTER — Other Ambulatory Visit: Payer: Self-pay | Admitting: Neurology

## 2024-03-09 ENCOUNTER — Ambulatory Visit: Attending: Cardiovascular Disease | Admitting: Physician Assistant

## 2024-03-09 ENCOUNTER — Ambulatory Visit (HOSPITAL_COMMUNITY)
Admission: RE | Admit: 2024-03-09 | Discharge: 2024-03-09 | Disposition: A | Discharge status: Home / Self Care | Source: Ambulatory Visit | Attending: Cardiology | Admitting: Cardiology

## 2024-03-09 ENCOUNTER — Ambulatory Visit: Payer: Self-pay | Admitting: Physician Assistant

## 2024-03-09 ENCOUNTER — Encounter: Payer: Self-pay | Admitting: Physician Assistant

## 2024-03-09 VITALS — BP 86/54 | HR 64 | Ht 69.0 in | Wt 189.0 lb

## 2024-03-09 DIAGNOSIS — I5022 Chronic systolic (congestive) heart failure: Secondary | ICD-10-CM | POA: Diagnosis not present

## 2024-03-09 DIAGNOSIS — R5383 Other fatigue: Secondary | ICD-10-CM

## 2024-03-09 DIAGNOSIS — I959 Hypotension, unspecified: Secondary | ICD-10-CM | POA: Diagnosis not present

## 2024-03-09 DIAGNOSIS — I4891 Unspecified atrial fibrillation: Secondary | ICD-10-CM | POA: Diagnosis not present

## 2024-03-09 DIAGNOSIS — Z950 Presence of cardiac pacemaker: Secondary | ICD-10-CM

## 2024-03-09 DIAGNOSIS — J849 Interstitial pulmonary disease, unspecified: Secondary | ICD-10-CM | POA: Diagnosis not present

## 2024-03-09 LAB — BASIC METABOLIC PANEL WITH GFR
BUN/Creatinine Ratio: 23 (ref 10–24)
BUN: 14 mg/dL (ref 8–27)
CO2: 27 mmol/L (ref 20–29)
Calcium: 8.7 mg/dL (ref 8.6–10.2)
Chloride: 101 mmol/L (ref 96–106)
Creatinine, Ser: 0.62 mg/dL — ABNORMAL LOW (ref 0.76–1.27)
Glucose: 98 mg/dL (ref 70–99)
Potassium: 4.4 mmol/L (ref 3.5–5.2)
Sodium: 138 mmol/L (ref 134–144)
eGFR: 93 mL/min/1.73 (ref >59)

## 2024-03-09 LAB — CBC
Hematocrit: 45 % (ref 37.5–51.0)
Hemoglobin: 14.7 g/dL (ref 13.0–17.7)
MCH: 30.7 pg (ref 26.6–33.0)
MCHC: 32.7 g/dL (ref 31.5–35.7)
MCV: 94 fL (ref 79–97)
Platelets: 163 x10E3/uL (ref 150–450)
RBC: 4.79 x10E6/uL (ref 4.14–5.80)
RDW: 14.5 % (ref 11.6–15.4)
WBC: 7.3 x10E3/uL (ref 3.4–10.8)

## 2024-03-09 MED ORDER — LOSARTAN POTASSIUM 25 MG PO TABS: 12.0000 mg | ORAL_TABLET | Freq: Every day | ORAL | 1 refills | Status: DC

## 2024-03-09 NOTE — Progress Notes (Signed)
 Cardiology Office Note   Date:  03/09/2024  ID:  David LITTIE Eveline Mickey., DOB [birth year], MRN [id number] PCP: David Nottingham, MD  [facility] Providers Cardiologist:  Redell Shallow, MD Electrophysiologist:  Lynwood Rakers, MD (Inactive)     History of Present Illness David L Zyion Doxtater. is a 88 y.o. male with PMH of CAD, CHF, hypertension, myasthenia gravis, LBBB and history of complete heart block s/p pacemaker.  He had a PCI of LAD in 2003 and had a pacemaker placed in 2016.  Echocardiogram in 2016 showed normal EF, moderate LVH, grade 1 DD, mild AI, mild aortic root dilatation, moderate TR.  Myoview in 2016 showed EF 43%, inferior thinning but no ischemia.  He was admitted in August 2022 for new onset of heart failure.  He was also noted to be anemic at the time.  Hemoglobin was 9.4.  Baseline hemoglobin was 13.  BNP was 1466.  Iron studies consistent with severely low iron.  He underwent IV diuresis.  Echocardiogram in August 2022 showed EF 20 to 25%, mild LV enlargement, mild LVH, grade 1 DD, mild RV enlargement, moderate LAE, severe right atrial enlargement, mild MR, moderate TR, moderate to severe AI, dilated ascending aorta measuring at 45 mm.  Heart failure medication was titrated.  Ischemic workup was deferred given age and comorbidity.  GI service consulted and started on PPI but no endoscopic procedure was pursued.  He was readmitted to the hospital near the end of August 2022 with symptomatic hypotension.  He was treated with IV fluid.  Entresto  was discontinued and carvedilol  also discontinued.  Entresto  was restarted at a low dose in September 2022 however later transition to low-dose losartan  as he could not tolerate Entresto .  Carvedilol  was resumed.  By February 2023, EF has improved to 40 to 45% on repeat echocardiogram, trivial MR, moderate AI, dilated ascending aorta measuring 45 mm.  Last echocardiogram performed on 09/03/2022 showed EF stable at 40 to 45%, global hypokinesis,  normal RV EF, trivial MR, moderate AI, dilated ascending aorta at 45 mm.  He was most recently seen by Dr. Shallow on 02/04/2024 at which time he denied any dyspnea and palpitation.   He was last seen by Dr. Shallow on 02/04/2024, given device interrogation, he was noted to have asymptomatic A-fib.  He was started on Eliquis .  He presents today for follow-up, the plan is that if A-fib persist I will begin amiodarone therapy followed by cardioversion 4 to 6 weeks later to see if he would hold sinus rhythm.  Could also consider referral for Watchman device.  Patient presents today for follow-up.  Blood pressure is 86/54.  Even on manual recheck by myself, right arm pressure was 80/44, left arm pressure was 86/54.  Surprisingly patient denies any discomfort.  He has no chest pain or shortness of breath.  He has no dizziness or feeling of passing out.  He does feel more fatigued this morning.  He just returned from a weeklong trip to [place] .  He is going out to cruise on Sunday to celebrate their wedding anniversary.  He does have black stools however he denies any recent change to the color of his stools.  He does not appear to be pale.  I discussed his case with DOD Dr. Elmira, we decided to stop his spironolactone  and cut the losartan  in half.  He will need stat CBC and a basic metabolic panel.  He does have bibasilar crackles however has no lower extremity edema to suggest  acute heart failure exacerbation.  I will obtain a chest x-ray as well.  If systolic blood pressure does not improve to greater than 100 mmHg and lab work and the x-ray okay then he can resume travel this Sunday, however I have low very low threshold of sending him to the ED if his condition does not improve.  ROS:   He complains of fatigue but no chest pain or shortness of breath.  He denies any lower extremity edema, orthopnea or PND.  Studies Reviewed EKG Interpretation Date/Time:  Wednesday March 09 2024 11:44:25  EDT Ventricular Rate:  67 PR Interval:    QRS Duration:  178 QT Interval:  454 QTC Calculation: 479 R Axis:   -76  Text Interpretation: Ventricular-paced rhythm When compared with ECG of 04-Feb-2024 11:55, Vent. rate has decreased BY  14 BPM Confirmed by Taleah Bellantoni ([id number]) on 03/09/2024 9:18:37 PM    Cardiac Studies & Procedures   ______________________________________________________________________________________________   STRESS TESTS  MYOCARDIAL PERFUSION IMAGING 04/06/2015  Interpretation Summary  The left ventricular ejection fraction is moderately decreased (30-44%).  Nuclear stress EF: 43%.  This is a low risk study.  Low risk stress nuclear study with a small, mild intensity, fixed inferior defect consistent with inferior thinning; no ischemia; EF 43 with apical hypokinesis and mild LVE.   ECHOCARDIOGRAM  ECHOCARDIOGRAM COMPLETE 09/03/2022  Narrative ECHOCARDIOGRAM REPORT    Patient Name:   David L Schweers Jr. Date of Exam: 09/03/2022 Medical Rec #:  [id number]             Height:       70.0 in Accession #:    [id number]            Weight:       160.5 lb Date of Birth:  07/12/1936             BSA:          1.901 m Patient Age:    87 years              BP:           132/77 mmHg Patient Gender: M                     HR:           61  bpm. Exam Location:  [address]  Procedure: 2D Echo, Cardiac Doppler and Color Doppler  Indications:    I35.0 Aortic Valve disorder  History:        Patient has prior history of Echocardiogram examinations, most recent 09/10/2021. CHF, CAD, Pacemaker, Arrythmias:LBBB and NSVT, CHB; Risk Factors:Diabetes, Sleep Apnea and HLD.  Sonographer:    Waldo Guadalajara RCS Referring Phys: 65 BRIAN S CRENSHAW  IMPRESSIONS   1. Left ventricular ejection fraction, by estimation, is 40 to 45%. The left ventricle has mildly decreased function. The left ventricle demonstrates global hypokinesis. Left ventricular diastolic parameters are  indeterminate. 2. Right ventricular systolic function is normal. The right ventricular size is normal. There is normal pulmonary artery systolic pressure. 3. The mitral valve is normal in structure. Trivial mitral valve regurgitation. No evidence of mitral stenosis. 4. The aortic valve is tricuspid. Aortic valve regurgitation is moderate. No aortic stenosis is present. Aortic regurgitation PHT measures 510 msec. 5. Aortic dilatation noted. There is moderate dilatation of the ascending aorta, measuring 45 mm. 6. The inferior vena cava is normal in size with greater than 50% respiratory variability, suggesting right atrial pressure of 3  mmHg.  FINDINGS Left Ventricle: Left ventricular ejection fraction, by estimation, is 40 to 45%. The left ventricle has mildly decreased function. The left ventricle demonstrates global hypokinesis. The left ventricular internal cavity size was normal in size. There is no left ventricular hypertrophy. Left ventricular diastolic parameters are indeterminate. Indeterminate filling pressures.  Right Ventricle: The right ventricular size is normal. No increase in right ventricular wall thickness. Right ventricular systolic function is normal. There is normal pulmonary artery systolic pressure. The tricuspid regurgitant velocity is 2.21 m/s, and with an assumed right atrial pressure of 3 mmHg, the estimated right ventricular systolic pressure is 22.5 mmHg.  Left Atrium: Left atrial size was normal in size.  Right Atrium: Right atrial size was normal in size.  Pericardium: There is no evidence of pericardial effusion.  Mitral Valve: The mitral valve is normal in structure. Trivial mitral valve regurgitation. No evidence of mitral valve stenosis.  Tricuspid Valve: The tricuspid valve is normal in structure. Tricuspid valve regurgitation is mild . No evidence of tricuspid stenosis.  Aortic Valve: The aortic valve is tricuspid. Aortic valve regurgitation is moderate.  Aortic regurgitation PHT measures 510 msec. No aortic stenosis is present.  Pulmonic Valve: The pulmonic valve was normal in structure. Pulmonic valve regurgitation is trivial. No evidence of pulmonic stenosis.  Aorta: The aortic root is normal in size and structure and aortic dilatation noted. There is moderate dilatation of the ascending aorta, measuring 45 mm.  Venous: The inferior vena cava is normal in size with greater than 50% respiratory variability, suggesting right atrial pressure of 3 mmHg.  IAS/Shunts: No atrial level shunt detected by color flow Doppler.  Additional Comments: A device lead is visualized.   LEFT VENTRICLE PLAX 2D LVIDd:         5.00 cm   Diastology LVIDs:         4.00 cm   LV e' medial:    7.51 cm/s LV PW:         1.20 cm   LV E/e' medial:  11.9 LV IVS:        1.60 cm   LV e' lateral:   10.70 cm/s LVOT diam:     2.35 cm   LV E/e' lateral: 8.3 LV SV:         70 LV SV Index:   37 LVOT Area:     4.34 cm   RIGHT VENTRICLE RV Basal diam:  3.30 cm RV S prime:     7.72 cm/s TAPSE (M-mode): 1.9 cm RVSP:           22.5 mmHg  LEFT ATRIUM             Index        RIGHT ATRIUM           Index LA diam:        4.50 cm 2.37 cm/m   RA Pressure: 3.00 mmHg LA Vol (A2C):   49.0 ml 25.78 ml/m  RA Area:     15.10 cm LA Vol (A4C):   44.2 ml 23.25 ml/m  RA Volume:   31.80 ml  16.73 ml/m LA Biplane Vol: 47.1 ml 24.78 ml/m AORTIC VALVE LVOT Vmax:   75.30 cm/s LVOT Vmean:  52.567 cm/s LVOT VTI:    0.162 m AI PHT:      510 msec  AORTA Ao Root diam: 4.60 cm Ao Asc diam:  4.50 cm  MITRAL VALVE  TRICUSPID VALVE MV Area (PHT):             TR Peak grad:   19.5 mmHg MV Decel Time:             TR Vmax:        221.00 cm/s MV E velocity: 89.10 cm/s  Estimated RAP:  3.00 mmHg RVSP:           22.5 mmHg  SHUNTS Systemic VTI:  0.16 m Systemic Diam: 2.35 cm  Annabella Scarce MD Electronically signed by Annabella Scarce MD Signature Date/Time:  09/03/2022/3:08:02 PM    Final          ______________________________________________________________________________________________      Risk Assessment/Calculations  CHA2DS2-VASc Score = 4   This indicates a 4.8% annual risk of stroke. The patient's score is based upon: CHF History: 1 HTN History: 1 Diabetes History: 0 Stroke History: 0 Vascular Disease History: 0 Age Score: 2 Gender Score: 0            Physical Exam VS:  BP (!) 86/54   Pulse 64   Ht 5' 9 (1.753 m)   Wt 189 lb (85.7 kg)   SpO2 97%   BMI 27.91 kg/m        Wt Readings from Last 3 Encounters:  03/09/24 189 lb (85.7 kg)  02/04/24 186 lb (84.4 kg)  01/05/24 186 lb (84.4 kg)    GEN: Well nourished, well developed in no acute distress NECK: No JVD; No carotid bruits CARDIAC: RRR, no murmurs, rubs, gallops RESPIRATORY: Bibasilar crackles ABDOMEN: Soft, non-tender, non-distended EXTREMITIES:  No edema; No deformity   ASSESSMENT AND PLAN  Hypotension: Blood pressure in the 80s, I discussed the case with DOD Dr. Elmira.  Surprisingly patient is asymptomatic other than just mild fatigue.  He denies any dizziness or feeling of passing out.  Will stop spironolactone  and cut losartan  in half.  He plans to go on a cruise this Sunday, I recommend he hold off on going to the cruise if systolic blood pressure remain less than 100 mmHg.  Will obtain CBC and a basic metabolic panel to rule out anemia and AKI.  We will also obtain chest x-ray to make sure he does not have any pneumonia.  He does have bibasilar crackles on exam but majority of the lung is clear.  Fatigue: Likely related to #1.  But no dizziness or feeling of passing out.  Will obtain blood work and a chest x-ray  Atrial fibrillation: He was started on Eliquis  during the last office visit due to evidence of A-fib on the heart monitor interrogation.  He is in paced rhythm today, therefore I am unable to tell if he is still in A-fib and what is  his overall A-fib burden.  Dr. Pietro previously recommended start him on amiodarone if he is still in A-fib and consider outpatient cardioversion.  I asked the patient to send me a remote transmission once he gets home  Chronic HFrEF: Previously noted to have low EF of 25% in August 2022.  Ejection fraction has improved to 40 to 45% since then.  Ischemic workup deferred due to age  History of pacemaker: Followed by EP service.  Will have the patient send in a remote transmission to see what is his A-fib burden and whether he is currently A-fib.        Dispo: Follow-up in 3 weeks.  Signed, Scot Ford, PA

## 2024-03-09 NOTE — Progress Notes (Signed)
 I have called and spoke with the patient's wife, blood work is reassuring, no anemia, normal white blood cell count, normal renal function and electrolyte

## 2024-03-09 NOTE — Patient Instructions (Addendum)
 Medication Instructions:  1.Stop spironolactone  2.Decrease losartan  to 12.5 mg daily, this will be 1/2 of the 25 mg tablet *If you need a refill on your cardiac medications before your next appointment, please call your pharmacy*  Lab Work: STAT-CBC, BMET-TODAY If you have labs (blood work) drawn today and your tests are completely normal, you will receive your results only by: MyChart Message (if you have MyChart) OR A paper copy in the mail If you have any lab test that is abnormal or we need to change your treatment, we will call you to review the results.  Testing: Chest X-ray ordered today  Follow-Up: At [facility], you and your health needs are our priority.  As part of our continuing mission to provide you with exceptional heart care, our providers are all part of one team.  This team includes your primary Cardiologist (physician) and Advanced Practice Providers or APPs (Physician Assistants and Nurse Practitioners) who all work together to provide you with the care you need, when you need it.  Your next appointment:   3 week(s)  Provider:   Scot Ford, PA-C         Other Instructions 1.We need a remote pacer check when you get home to see if you are in Afib  2.If you continue to experience dizziness and feeling like you are going to pass out, seek immediate medical attention.

## 2024-03-10 ENCOUNTER — Telehealth: Payer: Self-pay | Admitting: *Deleted

## 2024-03-10 DIAGNOSIS — E7800 Pure hypercholesterolemia, unspecified: Secondary | ICD-10-CM | POA: Diagnosis not present

## 2024-03-10 DIAGNOSIS — M810 Age-related osteoporosis without current pathological fracture: Secondary | ICD-10-CM | POA: Diagnosis not present

## 2024-03-10 DIAGNOSIS — R7303 Prediabetes: Secondary | ICD-10-CM | POA: Diagnosis not present

## 2024-03-10 DIAGNOSIS — I10 Essential (primary) hypertension: Secondary | ICD-10-CM | POA: Diagnosis not present

## 2024-03-10 NOTE — Telephone Encounter (Signed)
-----   Message from Scot Ford sent at 03/09/2024  9:18 PM EDT ----- Regarding: remote interrogation Hi.  He was seen in the office today.  Patient was instructed to send in a remote transmission for her pacemaker today to see if she is still using A-fib and that to assess the A-fib burden.  EKG showed paced rhythm therefore unable to tell the underlying rhythm.  THanks Hao

## 2024-03-10 NOTE — Telephone Encounter (Signed)
 Remote transmission received:  Patient is in A-fib 100% of the time. Likely was A-pacing due to device undersensing fine a-fib waves. Sensitivity is programmed at lowest setting, unable to improve. Overall ventricular rates appear controlled. V-paces ~80% of the time.   Forwarding to provider for further review/ recommendations.  ___________________________________________________________________________

## 2024-03-11 NOTE — Telephone Encounter (Signed)
 Please inform the patient or his wife that David [name] has been in 100% afib during the recent month. His HR is well controlled and he is on Eliquis , therefore his afib is fairly controlled. The plan is for us  to discuss starting him on amiodarone when he returns.

## 2024-03-23 DIAGNOSIS — Z0000 Encounter for general adult medical examination without abnormal findings: Secondary | ICD-10-CM | POA: Diagnosis not present

## 2024-03-23 DIAGNOSIS — I2510 Atherosclerotic heart disease of native coronary artery without angina pectoris: Secondary | ICD-10-CM | POA: Diagnosis not present

## 2024-03-23 DIAGNOSIS — G7000 Myasthenia gravis without (acute) exacerbation: Secondary | ICD-10-CM | POA: Diagnosis not present

## 2024-03-23 DIAGNOSIS — N401 Enlarged prostate with lower urinary tract symptoms: Secondary | ICD-10-CM | POA: Diagnosis not present

## 2024-03-23 DIAGNOSIS — I5022 Chronic systolic (congestive) heart failure: Secondary | ICD-10-CM | POA: Diagnosis not present

## 2024-03-23 DIAGNOSIS — E118 Type 2 diabetes mellitus with unspecified complications: Secondary | ICD-10-CM | POA: Diagnosis not present

## 2024-03-24 NOTE — Progress Notes (Signed)
 Remote pacemaker transmission.

## 2024-03-27 ENCOUNTER — Other Ambulatory Visit: Payer: Self-pay | Admitting: Cardiology

## 2024-03-30 NOTE — Progress Notes (Unsigned)
 David L Eveline Raddle., male    DOB: July 19, 1936    MRN: [id number]   Brief patient profile:  89 yowm quit smoking 1984 (mostly)  s apparent resp sequelae  referred to pulmonary clinic in [place]  05/06/2023 by Dr Clarice  for recurrent cough  x  01/2023 asosoc with pnds x 2014.    History of Present Illness  05/06/2023  Pulmonary/ 1st office eval/ [facility] / [place] [facility]  Chief Complaint  Patient presents with   Establish Care   Cough  Dyspnea:  limited by weak legs / balance issues > sob  slow pace - usually stays in car  when goes anywhere while fm goes in. Cough: white mucus x one tbsp ended 05/02/23 s obvious reason   Sleep: bed is flat/ one pillow on R side s noct cc  SABA use: none  02: none  Says swallowing fine now but occ chokes on food / daughter is former  ST Pnds x 10 years > allergy testing > ? Bethany  pos tomatoes No pets at home  Rec Try off clariton and only take as needed for itchy/ sneezy / runny nose worse than you notice when you don't take it  Make sure you check your oxygen saturation at your highest level of activity(NOT after you stop)  to be sure it stays over 90% and keep track of it at least once a week, more often if breathing getting worse, and let me know if losing ground. (Collect the dots to connect the dots approach - once a week) You may need another formal speech/swallowing  eval but I am deferring this to your daughter and Dr Clarice  Please schedule a follow up visit in 3 months but call sooner if needed  [facility]) bring all medications - including medications    Cxr c/w ILD  05/06/23    01/05/2024  f/u ov/Latorria Zeoli re: ? ILD    maint on no resp rx   Chief Complaint  Patient presents with   Follow-up    D.O.E f/u.   Dyspnea:  Not limited by breathing from desired activities   Cough: gone to his satisfaction/ assoc with mild pnds persists assoc with obvious daytime watery rhinorrhea   Sleeping: flat bed  one pillow s noct or early am  resp cc   SABA use: none  02: none  Rec Atrovent  nasal spray  up to 2 puffs  every 6 hours as needed Make sure you check your oxygen saturation at your highest level of activity(NOT after you stop)  to be sure it stays over 90%  Cxr 01/05/24  Lung findings consistent with chronic interstitial fibrosis, without significant change from the previous chest radiograph.   03/31/2024  f/u ov/[place] office/Britney Newstrom re: PF  maint on no rx  Chief Complaint  Patient presents with   Shortness of Breath    SOB with exertion.  Dr. Clarice wanted patient to get checked for crackle sounds in lungs   Dyspnea:  very sedentary/ not lmited by doe  Cough: dry worse with meals though never coughs up food apparently  Sleeping: flat bed one pillow    resp cc  SABA use: none    No obvious day to day or daytime variability or assoc excess/ purulent sputum or mucus plugs or hemoptysis or cp or chest tightness, subjective wheeze or overt sinus or hb symptoms.    Also denies any obvious fluctuation of symptoms with weather or environmental changes or other aggravating or alleviating  factors except as outlined above   No unusual exposure hx or h/o childhood pna/ asthma or knowledge of premature birth.  Current Allergies, Complete Past Medical History, Past Surgical History, Family History, and Social History were reviewed in [name] record.  ROS  The following are not active complaints unless bolded Hoarseness, sore throat, dysphagia, dental problems, itching, sneezing,  nasal congestion or discharge of excess watery mucus esp at meals or purulent secretions, ear ache,   fever, chills, sweats, unintended wt loss or wt gain, classically pleuritic or exertional cp,  orthopnea pnd or arm/hand swelling  or leg swelling L >R  presyncope, palpitations, abdominal pain, anorexia, nausea, vomiting, diarrhea  or change in bowel habits or change in bladder habits, change in stools or change in urine, dysuria,  hematuria,  rash, arthralgias, visual complaints, headache, numbness, weakness or ataxia or problems with walking or coordination,  change in mood or  memory.        Current Meds  Medication Sig   apixaban  (ELIQUIS ) 5 MG TABS tablet Take 1 tablet (5 mg total) by mouth 2 (two) times daily.   carvedilol  (COREG ) 6.25 MG tablet TAKE 1 TABLET(6.25 MG) BY MOUTH TWICE DAILY   Cholecalciferol (VITAMIN D3) 50 MCG (2000 UT) CAPS Take 1 capsule by mouth daily.   desmopressin  (DDAVP ) 0.1 MG tablet TAKE 1 TABLET(0.1 MG TABLET) BY MOUTH AT BEDTIME. CALL [phone] TO SCHEDULE FOLLOW UP FOR ONGOING REFILLS.   empagliflozin  (JARDIANCE ) 10 MG TABS tablet TAKE 1 TABLET BY MOUTH EVERY DAY   ezetimibe -simvastatin  (VYTORIN ) 10-40 MG tablet Take 1 tablet by mouth daily.   furosemide  (LASIX ) 20 MG tablet Take 1 tablet (20 mg total) by mouth as needed for edema (for weight gain of 2-3lb and ankle edema).   ipratropium (ATROVENT ) 0.06 % nasal spray Up to 2 puffs every 6 hours as needed   loratadine  (CLARITIN ) 10 MG tablet Take 10 mg by mouth daily.   losartan  (COZAAR ) 25 MG tablet Take 0.5 tablets (12.5 mg total) by mouth daily.   mycophenolate  (CELLCEPT ) 250 MG capsule Take 1 capsule (250 mg total) by mouth 2 (two) times daily.   MYRBETRIQ  50 MG TB24 tablet Take 50 mg by mouth daily.   nitroGLYCERIN  (NITROSTAT ) 0.4 MG SL tablet Place 1 tablet (0.4 mg total) under the tongue every 5 (five) minutes as needed for chest pain.   Polyethyl Glycol-Propyl Glycol (SYSTANE) 0.4-0.3 % SOLN Place 1 drop into both eyes daily as needed (dry eyes).   pyridostigmine  (MESTINON ) 60 MG tablet Take 1 tablet (60 mg total) by mouth 5 (five) times daily.   tamsulosin  (FLOMAX ) 0.4 MG CAPS capsule Take 1 capsule (0.4 mg total) by mouth daily.          Past Medical History:  Diagnosis Date   Aortic insufficiency    Ascending aorta dilation (HCC)    Balance problem    CAD (coronary artery disease)    previous stent to LAD 2003    Diabetes mellitus without complication (HCC)    No longer taking diabetic medications at this time   GI bleeding    HFrEF (heart failure with reduced ejection fraction) (HCC)    History of complete heart block    History of kidney stones    Hyperlipidemia    Hypertension    Iron deficiency anemia    LBBB (left bundle branch block)    Melanoma (HCC)    Myasthenia gravis (HCC)    eyelids, finger, and toes  NSVT (nonsustained ventricular tachycardia) (HCC)    OSA (obstructive sleep apnea)    Does not wear CPAP.   Presence of permanent cardiac pacemaker    S/P cardiac pacemaker procedure, placement 10/24/14 Medtronic Adapta L model ADDRL 1  10/25/2014   Spinal stenosis    Severe   Tricuspid regurgitation       Objective:    Wts  03/31/2024         195   01/05/2024       186    08/20/23 185 lb 9.6 oz (84.2 kg)  05/06/23 173 lb (78.5 kg)  02/11/23 164 lb 8 oz (74.6 kg)    Vital signs reviewed  03/31/2024  - Note at rest 02 sats  92% on RA   General appearance:    amb wm nad/ slt voice fatigue     HEENT : Oropharynx  clear      Nasal turbinates nl    NECK :  without  apparent JVD/ palpable Nodes/TM    LUNGS: no acc muscle use,   mod kyphotic contour chest which is clear to A and P bilaterally without cough on insp or exp maneuvers   CV:  RRR  no s3  2/6 diastolic murmur  or increase in P2, and no edema   ABD:  soft and nontender   MS:    ext warm without deformities Or obvious joint restrictions  calf tenderness, cyanosis or clubbing    SKIN: warm and dry without lesions    NEURO:  alert, approp, nl sensorium with  no motor or cerebellar deficits apparent.      I personally reviewed images and agree with radiology impression as follows:  CXR:  Pa and lateral 03/09/24  Heterogeneous areas of bilateral interstitial thickening are without change from the prior exam consistent with interstitial lung disease. No evidence of pneumonia or pulmonary edema Cardiac  silhouette is mildly enlarged, stable  My review: no additional findings   Assessment   Assessment & Plan DOE (dyspnea on exertion) Doe/ cough x April 2024  - echo 09/03/22  EF 40-35% mod AI  - 05/06/2023   Walked on RA  x  1  lap(s) =  approx 150  ft  @ nl  pace, stopped due to unsteady on feet  with lowest 02 sats 94%  - 08/20/2023   Walked on RA  x  one  lap(s) =  approx 250  ft  @ slow/cane pace, stopped due to fatigue  with lowest 02 sats 95%   - 01/05/2024   Walked on RA  x  3  lap(s) =  approx 750  ft  @ slow pace, stopped due to end of study with lowest 02 sats 95% and no sob    - 03/31/2024   Walked on RA  x  1  lap(s) =  approx 250  ft  @ slow pace/unsteady gait, held the wall while he walked/refused to use a walker/no SOB  pace, stopped due to fatigue  with lowest 02 sats 91% but improved to 92% before stopped    Because he is getting more frail and inactive we really can't tell for sure his pf is  progressing or not  at this point and reasonable to do HRCT though only if we would consider offering antifibrotics, which really have not been tested in this setting.   Rec try self monitoring at home peak ex sats and HRCT if losing any ground at all if Dr Clarice /  pt/fm want to commit to a PF w/u and rx plan  In meatime rec: Use of PPI is associated with improved survival time and with decreased radiologic fibrosis per King's study published in AJRCCM vol 184 p1390.  Dec 2011 and also may have other beneficial effects as per the latest review in [place] vol 193 p1345 Jun 20016.  This may not always be cause and effect, but given how universally underwhelming  (at least in terms of short term benefit)   and expensive  all the other  Drugs developed to day  have been for pf,   rec start  rx ppi / diet/ lifestyle modification and f/u with serial walking sats and lung volumes for now to put more points on the curve / establish firm baseline before considering additional measures.     Upper airway  cough syndrome Onset April 2024 in setting of Chronic rhinitis x 2014  and allergy testing ? Bethany? Unhelpful - 05/06/2023 rec clariton prn but stop it if not better on vs off and maybe try zyrtec if tolerates  - low threshold to repeat ST eval for ? Recurrent aspiration?  And do ent eval for chronic rhinitis or repeat the CT sinus if cough recurs.  - 01/05/2024 try atovent NS 0.06  - MBS  03/31/2024 >>>   Now describing consistent cough with meals with risk of aspiration > MBS next step p initiate gerd rx today   Discussed in detail all the  indications, usual  risks and alternatives  relative to the benefits with patient who agrees to proceed with w/u as outlined.     Each maintenance medication was reviewed in detail including emphasizing most importantly the difference between maintenance and prns and under what circumstances the prns are to be triggered using an action plan format where appropriate.  Total time for H and P, chart review, counseling,  directly observing portions of ambulatory 02 saturation study/ and generating customized AVS unique to this office visit / same day charting = 41 min               Vasomotor rhinitis Trial of atovent  0.6 NS 01/05/2024 >>> and again 03/31/2024     AVS  Patient Instructions  My office will be contacting you by phone for referral to [facility]   - if you don't hear back from my office within one week please call us  back or notify us  thru MyChart and we'll address it right away.   Atrovent  nasa spray as needed for drippy nose   Make sure you check your oxygen saturation at your highest level of activity(NOT after you stop)  to be sure it stays over 90% and keep track of it at least once a week, more often if breathing getting worse, and let me know if losing ground. (Collect the dots to connect the dots approach)     Pantoprazole  (protonix ) 40 mg   Take  30-60 min before first meal of the day and Pepcid  (famotidine )  20 mg  after supper until return to office - this is the best way to tell whether stomach acid is contributing to your problem.    GERD (REFLUX)  is an extremely common cause of respiratory symptoms just like yours , many times with no obvious heartburn at all.    It can be treated with medication, but also with lifestyle changes including elevation of the head of your bed (ideally with 6 -8inch blocks under the headboard of  your bed),  Smoking cessation, avoidance of late meals, excessive alcohol , and avoid fatty foods, chocolate, peppermint, colas, red wine, and acidic juices such as orange juice.  NO MINT OR MENTHOL PRODUCTS SO NO COUGH DROPS  USE SUGARLESS CANDY INSTEAD (Jolley ranchers or Stover's or Life Savers) or even ice chips will also do - the key is to swallow to prevent all throat clearing. NO OIL BASED VITAMINS - use powdered substitutes.  Avoid fish oil when coughing.   Please schedule a follow up visit in 2 months but call sooner if needed  with all medications /inhalers/ solutions in hand so we can verify exactly what you are taking. This includes all medications from all doctors and over the counters     Ozell America, MD 03/31/2024

## 2024-03-31 ENCOUNTER — Ambulatory Visit (INDEPENDENT_AMBULATORY_CARE_PROVIDER_SITE_OTHER): Admitting: Internal Medicine

## 2024-03-31 ENCOUNTER — Encounter: Payer: Self-pay | Admitting: Internal Medicine

## 2024-03-31 VITALS — BP 124/74 | HR 75 | Temp 98.9°F | Ht 69.0 in | Wt 195.8 lb

## 2024-03-31 DIAGNOSIS — J300 Vasomotor rhinitis: Secondary | ICD-10-CM

## 2024-03-31 DIAGNOSIS — R0609 Other forms of dyspnea: Secondary | ICD-10-CM | POA: Diagnosis not present

## 2024-03-31 DIAGNOSIS — R058 Other specified cough: Secondary | ICD-10-CM

## 2024-03-31 MED ORDER — IPRATROPIUM BROMIDE 0.06 % NA SOLN: NASAL | 12 refills | Status: AC

## 2024-03-31 MED ORDER — FAMOTIDINE 20 MG PO TABS
ORAL_TABLET | ORAL | 11 refills | Status: AC
Start: 2024-03-31 — End: ?

## 2024-03-31 MED ORDER — PANTOPRAZOLE SODIUM 40 MG PO TBEC
40.0000 mg | DELAYED_RELEASE_TABLET | Freq: Every day | ORAL | 2 refills | Status: DC
Start: 2024-03-31 — End: 2024-05-11

## 2024-03-31 NOTE — Assessment & Plan Note (Addendum)
 Trial of atovent  0.6 NS 01/05/2024 >>> and again 03/31/2024

## 2024-03-31 NOTE — Assessment & Plan Note (Addendum)
 Onset April 2024 in setting of Chronic rhinitis x 2014  and allergy testing ? Bethany? Unhelpful - 05/06/2023 rec clariton prn but stop it if not better on vs off and maybe try zyrtec if tolerates  - low threshold to repeat ST eval for ? Recurrent aspiration?  And do ent eval for chronic rhinitis or repeat the CT sinus if cough recurs.  - 01/05/2024 try atovent NS 0.06  - MBS  03/31/2024 >>>   Now describing consistent cough with meals with risk of aspiration > MBS next step p initiate gerd rx today   Discussed in detail all the  indications, usual  risks and alternatives  relative to the benefits with patient who agrees to proceed with w/u as outlined.     Each maintenance medication was reviewed in detail including emphasizing most importantly the difference between maintenance and prns and under what circumstances the prns are to be triggered using an action plan format where appropriate.  Total time for H and P, chart review, counseling,  directly observing portions of ambulatory 02 saturation study/ and generating customized AVS unique to this office visit / same day charting = 41 min

## 2024-03-31 NOTE — Patient Instructions (Addendum)
 My office will be contacting you by phone for referral to [facility]   - if you don't hear back from my office within one week please call us  back or notify us  thru MyChart and we'll address it right away.   Atrovent  nasa spray as needed for drippy nose   Make sure you check your oxygen saturation at your highest level of activity(NOT after you stop)  to be sure it stays over 90% and keep track of it at least once a week, more often if breathing getting worse, and let me know if losing ground. (Collect the dots to connect the dots approach)     Pantoprazole  (protonix ) 40 mg   Take  30-60 min before first meal of the day and Pepcid  (famotidine )  20 mg after supper until return to office - this is the best way to tell whether stomach acid is contributing to your problem.    GERD (REFLUX)  is an extremely common cause of respiratory symptoms just like yours , many times with no obvious heartburn at all.    It can be treated with medication, but also with lifestyle changes including elevation of the head of your bed (ideally with 6 -8inch blocks under the headboard of your bed),  Smoking cessation, avoidance of late meals, excessive alcohol , and avoid fatty foods, chocolate, peppermint, colas, red wine, and acidic juices such as orange juice.  NO MINT OR MENTHOL PRODUCTS SO NO COUGH DROPS  USE SUGARLESS CANDY INSTEAD (Jolley ranchers or Stover's or Life Savers) or even ice chips will also do - the key is to swallow to prevent all throat clearing. NO OIL BASED VITAMINS - use powdered substitutes.  Avoid fish oil when coughing.   Please schedule a follow up visit in 2 months but call sooner if needed  with all medications /inhalers/ solutions in hand so we can verify exactly what you are taking. This includes all medications from all doctors and over the counters

## 2024-03-31 NOTE — Assessment & Plan Note (Addendum)
 Doe/ cough x April 2024  - echo 09/03/22  EF 40-35% mod AI  - 05/06/2023   Walked on RA  x  1  lap(s) =  approx 150  ft  @ nl  pace, stopped due to unsteady on feet  with lowest 02 sats 94%  - 08/20/2023   Walked on RA  x  one  lap(s) =  approx 250  ft  @ slow/cane pace, stopped due to fatigue  with lowest 02 sats 95%   - 01/05/2024   Walked on RA  x  3  lap(s) =  approx 750  ft  @ slow pace, stopped due to end of study with lowest 02 sats 95% and no sob    - 03/31/2024   Walked on RA  x  1  lap(s) =  approx 250  ft  @ slow pace/unsteady gait, held the wall while he walked/refused to use a walker/no SOB  pace, stopped due to fatigue  with lowest 02 sats 91% but improved to 92% before stopped    Because he is getting more frail and inactive we really can't tell for sure his pf is  progressing or not  at this point and reasonable to do HRCT though only if we would consider offering antifibrotics, which really have not been tested in this setting.   Rec try self monitoring at home peak ex sats and HRCT if losing any ground at all if Dr Clarice / pt/fm want to commit to a PF w/u and rx plan  In meatime rec: Use of PPI is associated with improved survival time and with decreased radiologic fibrosis per King's study published in AJRCCM vol 184 p1390.  Dec 2011 and also may have other beneficial effects as per the latest review in [place] vol 193 p1345 Jun 20016.  This may not always be cause and effect, but given how universally underwhelming  (at least in terms of short term benefit)   and expensive  all the other  Drugs developed to day  have been for pf,   rec start  rx ppi / diet/ lifestyle modification and f/u with serial walking sats and lung volumes for now to put more points on the curve / establish firm baseline before considering additional measures.

## 2024-04-01 ENCOUNTER — Other Ambulatory Visit (HOSPITAL_COMMUNITY): Payer: Self-pay | Admitting: *Deleted

## 2024-04-01 DIAGNOSIS — R1310 Dysphagia, unspecified: Secondary | ICD-10-CM

## 2024-04-04 ENCOUNTER — Telehealth: Payer: Self-pay | Admitting: Neurology

## 2024-04-04 NOTE — Telephone Encounter (Signed)
 Patient said Dr. Vear instructed to call with any new prescriptions.  apixaban  (ELIQUIS ) 5 MG TABS tablet (replacing Asprin) and  pantoprazole  (PROTONIX ) 40 MG tablet, and famotidine  (PEPCID ) 20 MG tablet

## 2024-04-07 ENCOUNTER — Ambulatory Visit: Attending: Physician Assistant | Admitting: Physician Assistant

## 2024-04-07 NOTE — Progress Notes (Unsigned)
 This encounter was created in error - please disregard.

## 2024-04-20 ENCOUNTER — Ambulatory Visit (HOSPITAL_COMMUNITY)
Admission: RE | Admit: 2024-04-20 | Discharge: 2024-04-20 | Disposition: A | Discharge status: Home / Self Care | Source: Ambulatory Visit | Attending: Radiology | Admitting: Radiology

## 2024-04-20 ENCOUNTER — Ambulatory Visit (HOSPITAL_COMMUNITY)
Admission: RE | Admit: 2024-04-20 | Discharge: 2024-04-20 | Disposition: A | Discharge status: Home / Self Care | Source: Ambulatory Visit | Attending: Internal Medicine | Admitting: Internal Medicine

## 2024-04-20 DIAGNOSIS — R1310 Dysphagia, unspecified: Secondary | ICD-10-CM

## 2024-04-20 DIAGNOSIS — R0989 Other specified symptoms and signs involving the circulatory and respiratory systems: Secondary | ICD-10-CM | POA: Diagnosis not present

## 2024-04-20 DIAGNOSIS — G7000 Myasthenia gravis without (acute) exacerbation: Secondary | ICD-10-CM | POA: Diagnosis not present

## 2024-04-20 DIAGNOSIS — R058 Other specified cough: Secondary | ICD-10-CM

## 2024-04-20 NOTE — Progress Notes (Signed)
 Modified Barium Swallow Study  Patient Details  Name: David [name]. MRN: [id number] Date of Birth: 1936/02/25  Today's Date: 04/20/2024  HPI/PMH: HPI: 88 yo male referred for MBS by Dr Darlean. Pt PMH + Ocular Myasthenia Gravis x20-30 years, HLD, HTN, ataxia, spinal stenosis, allergic rhinitis, ataxia and weakness with walking and left more than right leg edema.  Pt also with recent issues with chronic throat clearing and coughing, which he attributes to secretions.  He reports occasional choking with foods - not liquids.  Of note, he underwent imaging of cervical spine with concerns for possible right vallecula mass - s/p scoping by ENT *Dr Karis* in 2018? which was negative.  Dr Darlean, pulmonology, started pt on regimen of PPI 30 minutes before breakfast and an H2blocker after supper.  He also advised pt avoid any mint.  Also recommend avoid throat clearing using intentional swallow instead.  Pt also with h/o left facial inflammatory changes prompting ED visit - imaging of neck concerning for parotitis with sialoadenitis of left submandibular and parotid glands, presumed to be viral. Pt reports largest complaint re: swallowing is his congestion stating If I could get rid of that, I would be fine.  He denies any issues with reflux, recurrent pneumonias, requiring heimlich manuever nor having weight loss.  He reports weight gain after having traveled on cruises.   Clinical Impression: Clinical Impression: Patient presents with overall functional oropharyngeal swallow ability. Variance in swallowing included pt triggering swallow at vallecular space with cracker and pyriform with puree and liquids.  Pt also talking prior to initiating swallow with solids filling vallecular space but he did elicit a swallow per verbal cue.  Trace laryngeal penetration of ultrathin *1/2 thin barium, 1/2 water* to cords with large sequential swallows. Cued cough effectively cleared penetration. Of note, pt did not cough  or clear his throat during the MBS.    However prior to MBS, he demonstrated chronic throat clearing both during interview and after water intake.   He did report sensation of regurgitation of the 3 ounces of water provided during [facility] screen.    Recommend continue diet with general precautions. Using teach back with review of a few flouroscopy loops, pt reported understanding.  Factors that may increase risk of adverse event in presence of aspiration [name] 2021): Factors that may increase risk of adverse event in presence of aspiration [name] 2021): Respiratory or GI disease; Limited mobility; Frail or deconditioned   Recommendations/Plan: Swallowing Evaluation Recommendations Swallowing Evaluation Recommendations Recommendations: PO diet PO Diet Recommendation: Regular; Thin liquids (Level 0) Liquid Administration via: Cup; Straw Medication Administration: Whole meds with liquid Supervision: Patient able to self-feed Swallowing strategies  : Slow rate; Small bites/sips Postural changes: Stay upright 30-60 min after meals; Out of bed for meals Oral care recommendations: Oral care BID (2x/day)    Treatment Plan No data recorded   Recommendations Recommendations for follow up therapy are one component of a multi-disciplinary discharge planning process, led by the attending physician.  Recommendations may be updated based on patient status, additional functional criteria and insurance authorization.  Assessment: Orofacial Exam: Orofacial Exam Oral Cavity: Oral Hygiene: WFL Oral Cavity - Dentition: Adequate natural dentition Orofacial Anatomy: WFL Oral Motor/Sensory Function: Other (comment) (suspect subtle left facial asymmetry, pt with slight head tilt to the right)    Anatomy:  Anatomy: WFL   Boluses Administered: Boluses Administered Boluses Administered: Thin liquids (Level 0); Mildly thick liquids (Level 2, nectar thick); Moderately thick  liquids (Level 3, honey thick); Puree; Solid     Oral Impairment Domain: Oral Impairment Domain Lip Closure: No labial escape Tongue control during bolus hold: Posterior escape of less than half of bolus (with thin liquid via straw x1) Bolus preparation/mastication: Slow prolonged chewing/mashing with complete recollection (pt reports he masticates well at baseline) Bolus transport/lingual motion: Brisk tongue motion Oral residue: Trace residue lining oral structures Location of oral residue : Tongue Initiation of pharyngeal swallow : Posterior laryngeal surface of the epiglottis; Valleculae     Pharyngeal Impairment Domain: Pharyngeal Impairment Domain Soft palate elevation: No bolus between soft palate (SP)/pharyngeal wall (PW) Laryngeal elevation: Complete superior movement of thyroid  cartilage with complete approximation of arytenoids to epiglottic petiole Anterior hyoid excursion: Complete anterior movement Epiglottic movement: Partial inversion Laryngeal vestibule closure: Incomplete, narrow column air/contrast in laryngeal vestibule Pharyngeal stripping wave : Present - diminished Pharyngeal contraction (A/P view only): -- (DNT due to pt being in incompatible wheelchair for A-P view) Tongue base retraction: Narrow column of contrast or air between tongue base and PPW Pharyngeal residue: Trace residue within or on pharyngeal structures Location of pharyngeal residue: Valleculae (vallecula retention with liquids spilled to pyriform sinus; vallecula retention with solids/puree)     Esophageal Impairment Domain: Esophageal Impairment Domain Esophageal clearance upright position: Complete clearance, esophageal coating    Pill: Pill Consistency administered: Thin liquids (Level 0) Thin liquids (Level 0): [facility]    Penetration/Aspiration Scale Score: Penetration/Aspiration Scale Score 1.  Material does not enter airway: Mildly thick liquids (Level 2, nectar thick);  Moderately thick liquids (Level 3, honey thick); Puree; Solid; Pill 5.  Material enters airway, CONTACTS cords and not ejected out: Thin liquids (Level 0); Pill    Compensatory Strategies: Compensatory Strategies Compensatory strategies: No       General Information: No data recorded  Diet Prior to this Study: Regular; Thin liquids (Level 0)    No data recorded   Respiratory Status: WFL    Supplemental O2: None (Room air)    History of Recent Intubation: No   Behavior/Cognition: Alert; Cooperative; Pleasant mood  Self-Feeding Abilities: Able to self-feed  Baseline vocal quality/speech: Dysphonic (appears slightly hoarse, ? due to chronicity of throat clearing)  Volitional Cough: Unable to elicit  Volitional Swallow: Able to elicit  Exam Limitations: No limitations   Goal Planning: No data recorded No data recorded No data recorded No data recorded No data recorded  Pain: No data recorded  End of Session: Start Time:SLP Start Time (ACUTE ONLY): 1310  Stop Time: SLP Stop Time (ACUTE ONLY): 1345  Time Calculation:SLP Time Calculation (min) (ACUTE ONLY): 35 min  Charges: SLP Evaluations $ SLP Speech Visit: 1 Visit  SLP Evaluations $Outpatient MBS Swallow: 1 Procedure   SLP visit diagnosis: SLP Visit Diagnosis: Dysphagia, unspecified (R13.10)    Past Medical History:  Past Medical History:  Diagnosis Date   Aortic insufficiency    Ascending aorta dilation    Balance problem    CAD (coronary artery disease)    previous stent to LAD 2003   Diabetes mellitus without complication (HCC)    No longer taking diabetic medications at this time   GI bleeding    HFrEF (heart failure with reduced ejection fraction) (HCC)    History of complete heart block    History of kidney stones    Hyperlipidemia    Hypertension    Iron deficiency anemia    LBBB (left bundle branch block)    Melanoma (HCC)  Myasthenia gravis (HCC)    eyelids, finger, and toes    NSVT (nonsustained ventricular tachycardia) (HCC)    OSA (obstructive sleep apnea)    Does not wear CPAP.   Presence of permanent cardiac pacemaker    S/P cardiac pacemaker procedure, placement 10/24/14 Medtronic Adapta L model ADDRL 1  10/25/2014   Spinal stenosis    Severe   Tricuspid regurgitation    Past Surgical History:  Past Surgical History:  Procedure Laterality Date   COLONOSCOPY     CORONARY ANGIOPLASTY WITH STENT PLACEMENT  03/01/2002   stenting to LAD - Dr. Bulah   CYSTOSCOPY/URETEROSCOPY/HOLMIUM LASER/STENT PLACEMENT Left 08/31/2019   Procedure: CYSTOSCOPY/RETROGRADE/STENT PLACEMENT;  Surgeon: Devere Lonni Righter, MD;  Location: [facility];  Service: Urology;  Laterality: Left;  ONY NEEDS 45 MIN   NM MYOCAR PERF WALL MOTION  11/27/2010   normal   PERMANENT PACEMAKER INSERTION N/A 10/24/2014   Procedure: PERMANENT PACEMAKER INSERTION;  Surgeon: Lynwood Rakers, MD;  Location: [facility] CATH LAB;  Service: Cardiovascular;  Laterality: N/A;   TONSILLECTOMY     TRANSURETHRAL RESECTION OF PROSTATE N/A 07/06/2019   Procedure: TRANSURETHRAL RESECTION OF THE PROSTATE (TURP), BIPOLAR;  Surgeon: Devere Lonni Righter, MD;  Location: [facility];  Service: Urology;  Laterality: N/A;   US  ECHOCARDIOGRAPHY  12/05/2010   proximal septal thickening,mild concentric LVH,mild deptal hypokinesis,RV mildly dilated,LA mildly dilated,mild AI,moderate aortic root dilatation,septal motion c/w conduction abnormality   Madelin POUR, MS [facility] SLP Acute Rehab Services Office [phone]  Nicolas Emmie Caldron 04/20/2024, 3:10 PM

## 2024-04-21 DIAGNOSIS — I739 Peripheral vascular disease, unspecified: Secondary | ICD-10-CM | POA: Diagnosis not present

## 2024-04-21 DIAGNOSIS — R600 Localized edema: Secondary | ICD-10-CM | POA: Diagnosis not present

## 2024-05-04 ENCOUNTER — Encounter (HOSPITAL_BASED_OUTPATIENT_CLINIC_OR_DEPARTMENT_OTHER): Payer: Self-pay

## 2024-05-04 ENCOUNTER — Emergency Department (HOSPITAL_BASED_OUTPATIENT_CLINIC_OR_DEPARTMENT_OTHER): Admitting: Radiology

## 2024-05-04 ENCOUNTER — Other Ambulatory Visit: Payer: Self-pay

## 2024-05-04 ENCOUNTER — Inpatient Hospital Stay (HOSPITAL_BASED_OUTPATIENT_CLINIC_OR_DEPARTMENT_OTHER)
Admission: EM | Admit: 2024-05-04 | Discharge: 2024-05-07 | DRG: 291 | Disposition: A | Discharge status: Home / Self Care | Source: Ambulatory Visit

## 2024-05-04 ENCOUNTER — Inpatient Hospital Stay (HOSPITAL_COMMUNITY)

## 2024-05-04 DIAGNOSIS — I509 Heart failure, unspecified: Principal | ICD-10-CM

## 2024-05-04 DIAGNOSIS — J9601 Acute respiratory failure with hypoxia: Secondary | ICD-10-CM | POA: Diagnosis present

## 2024-05-04 DIAGNOSIS — Z79899 Other long term (current) drug therapy: Secondary | ICD-10-CM | POA: Diagnosis not present

## 2024-05-04 DIAGNOSIS — R5383 Other fatigue: Secondary | ICD-10-CM | POA: Diagnosis not present

## 2024-05-04 DIAGNOSIS — R918 Other nonspecific abnormal finding of lung field: Secondary | ICD-10-CM | POA: Diagnosis not present

## 2024-05-04 DIAGNOSIS — I5043 Acute on chronic combined systolic (congestive) and diastolic (congestive) heart failure: Secondary | ICD-10-CM | POA: Diagnosis present

## 2024-05-04 DIAGNOSIS — Z7984 Long term (current) use of oral hypoglycemic drugs: Secondary | ICD-10-CM

## 2024-05-04 DIAGNOSIS — Z881 Allergy status to other antibiotic agents status: Secondary | ICD-10-CM

## 2024-05-04 DIAGNOSIS — I442 Atrioventricular block, complete: Secondary | ICD-10-CM | POA: Diagnosis present

## 2024-05-04 DIAGNOSIS — I4891 Unspecified atrial fibrillation: Secondary | ICD-10-CM | POA: Diagnosis present

## 2024-05-04 DIAGNOSIS — M810 Age-related osteoporosis without current pathological fracture: Secondary | ICD-10-CM | POA: Diagnosis not present

## 2024-05-04 DIAGNOSIS — N401 Enlarged prostate with lower urinary tract symptoms: Secondary | ICD-10-CM | POA: Diagnosis present

## 2024-05-04 DIAGNOSIS — Z85820 Personal history of malignant melanoma of skin: Secondary | ICD-10-CM

## 2024-05-04 DIAGNOSIS — I5021 Acute systolic (congestive) heart failure: Secondary | ICD-10-CM | POA: Diagnosis not present

## 2024-05-04 DIAGNOSIS — I110 Hypertensive heart disease with heart failure: Principal | ICD-10-CM | POA: Diagnosis present

## 2024-05-04 DIAGNOSIS — Z87891 Personal history of nicotine dependence: Secondary | ICD-10-CM | POA: Diagnosis not present

## 2024-05-04 DIAGNOSIS — N138 Other obstructive and reflux uropathy: Secondary | ICD-10-CM | POA: Diagnosis present

## 2024-05-04 DIAGNOSIS — Z87442 Personal history of urinary calculi: Secondary | ICD-10-CM

## 2024-05-04 DIAGNOSIS — E878 Other disorders of electrolyte and fluid balance, not elsewhere classified: Secondary | ICD-10-CM | POA: Diagnosis not present

## 2024-05-04 DIAGNOSIS — Z888 Allergy status to other drugs, medicaments and biological substances status: Secondary | ICD-10-CM

## 2024-05-04 DIAGNOSIS — E876 Hypokalemia: Secondary | ICD-10-CM | POA: Diagnosis not present

## 2024-05-04 DIAGNOSIS — I2510 Atherosclerotic heart disease of native coronary artery without angina pectoris: Secondary | ICD-10-CM | POA: Diagnosis not present

## 2024-05-04 DIAGNOSIS — Z91198 Patient's noncompliance with other medical treatment and regimen for other reason: Secondary | ICD-10-CM

## 2024-05-04 DIAGNOSIS — R338 Other retention of urine: Secondary | ICD-10-CM | POA: Diagnosis present

## 2024-05-04 DIAGNOSIS — R42 Dizziness and giddiness: Secondary | ICD-10-CM | POA: Diagnosis not present

## 2024-05-04 DIAGNOSIS — G4733 Obstructive sleep apnea (adult) (pediatric): Secondary | ICD-10-CM | POA: Diagnosis not present

## 2024-05-04 DIAGNOSIS — Z1152 Encounter for screening for COVID-19: Secondary | ICD-10-CM

## 2024-05-04 DIAGNOSIS — E785 Hyperlipidemia, unspecified: Secondary | ICD-10-CM | POA: Diagnosis present

## 2024-05-04 DIAGNOSIS — Z955 Presence of coronary angioplasty implant and graft: Secondary | ICD-10-CM | POA: Diagnosis not present

## 2024-05-04 DIAGNOSIS — Z801 Family history of malignant neoplasm of trachea, bronchus and lung: Secondary | ICD-10-CM

## 2024-05-04 DIAGNOSIS — Z803 Family history of malignant neoplasm of breast: Secondary | ICD-10-CM

## 2024-05-04 DIAGNOSIS — E873 Alkalosis: Secondary | ICD-10-CM | POA: Diagnosis not present

## 2024-05-04 DIAGNOSIS — G7000 Myasthenia gravis without (acute) exacerbation: Secondary | ICD-10-CM | POA: Diagnosis not present

## 2024-05-04 DIAGNOSIS — Z950 Presence of cardiac pacemaker: Secondary | ICD-10-CM

## 2024-05-04 DIAGNOSIS — Z79624 Long term (current) use of inhibitors of nucleotide synthesis: Secondary | ICD-10-CM

## 2024-05-04 DIAGNOSIS — E118 Type 2 diabetes mellitus with unspecified complications: Secondary | ICD-10-CM | POA: Diagnosis not present

## 2024-05-04 DIAGNOSIS — Z7983 Long term (current) use of bisphosphonates: Secondary | ICD-10-CM

## 2024-05-04 DIAGNOSIS — Z9079 Acquired absence of other genital organ(s): Secondary | ICD-10-CM

## 2024-05-04 DIAGNOSIS — J984 Other disorders of lung: Secondary | ICD-10-CM | POA: Diagnosis not present

## 2024-05-04 DIAGNOSIS — Z7901 Long term (current) use of anticoagulants: Secondary | ICD-10-CM

## 2024-05-04 DIAGNOSIS — I082 Rheumatic disorders of both aortic and tricuspid valves: Secondary | ICD-10-CM | POA: Diagnosis not present

## 2024-05-04 DIAGNOSIS — Z800 Family history of malignant neoplasm of digestive organs: Secondary | ICD-10-CM

## 2024-05-04 DIAGNOSIS — R0602 Shortness of breath: Secondary | ICD-10-CM | POA: Diagnosis not present

## 2024-05-04 LAB — CBC WITH DIFFERENTIAL/PLATELET
Abs Immature Granulocytes: 0.02 K/uL (ref 0.00–0.07)
Basophils Absolute: 0 K/uL (ref 0.0–0.1)
Basophils Relative: 0 %
Eosinophils Absolute: 0.1 K/uL (ref 0.0–0.5)
Eosinophils Relative: 1 %
HCT: 44.4 % (ref 39.0–52.0)
Hemoglobin: 13.6 g/dL (ref 13.0–17.0)
Immature Granulocytes: 0 %
Lymphocytes Relative: 23 %
Lymphs Abs: 1.3 K/uL (ref 0.7–4.0)
MCH: 30 pg (ref 26.0–34.0)
MCHC: 30.6 g/dL (ref 30.0–36.0)
MCV: 97.8 fL (ref 80.0–100.0)
Monocytes Absolute: 0.8 K/uL (ref 0.1–1.0)
Monocytes Relative: 13 %
Neutro Abs: 3.5 K/uL (ref 1.7–7.7)
Neutrophils Relative %: 63 %
Platelets: 162 K/uL (ref 150–400)
RBC: 4.54 MIL/uL (ref 4.22–5.81)
RDW: 13.8 % (ref 11.5–15.5)
WBC: 5.7 K/uL (ref 4.0–10.5)
nRBC: 0 % (ref 0.0–0.2)

## 2024-05-04 LAB — URINALYSIS, ROUTINE W REFLEX MICROSCOPIC
Bacteria, UA: NONE SEEN
Bilirubin Urine: NEGATIVE
Glucose, UA: >1000 mg/dL — AB
Hgb urine dipstick: NEGATIVE
Ketones, ur: NEGATIVE mg/dL
Leukocytes,Ua: NEGATIVE
Nitrite: NEGATIVE
Specific Gravity, Urine: 1.031 — ABNORMAL HIGH (ref 1.005–1.030)
pH: 6 (ref 5.0–8.0)

## 2024-05-04 LAB — BASIC METABOLIC PANEL WITH GFR
Anion gap: 10 (ref 5–15)
BUN: 14 mg/dL (ref 8–23)
CO2: 29 mmol/L (ref 22–32)
Calcium: 9.2 mg/dL (ref 8.9–10.3)
Chloride: 105 mmol/L (ref 98–111)
Creatinine, Ser: 0.62 mg/dL (ref 0.61–1.24)
GFR, Estimated: >60 mL/min (ref >60)
Glucose, Bld: 191 mg/dL — ABNORMAL HIGH (ref 70–99)
Potassium: 3.8 mmol/L (ref 3.5–5.1)
Sodium: 144 mmol/L (ref 135–145)

## 2024-05-04 LAB — PRO BRAIN NATRIURETIC PEPTIDE: Pro Brain Natriuretic Peptide: 3066 pg/mL — ABNORMAL HIGH (ref <300.0)

## 2024-05-04 LAB — GLUCOSE, CAPILLARY: Glucose-Capillary: 103 mg/dL — ABNORMAL HIGH (ref 70–99)

## 2024-05-04 LAB — HEMOGLOBIN A1C
Hgb A1c MFr Bld: 5.6 % (ref 4.8–5.6)
Mean Plasma Glucose: 114.02 mg/dL

## 2024-05-04 LAB — PHOSPHORUS: Phosphorus: 4.1 mg/dL (ref 2.5–4.6)

## 2024-05-04 LAB — RESP PANEL BY RT-PCR (RSV, FLU A&B, COVID)  RVPGX2
Influenza A by PCR: NEGATIVE
Influenza B by PCR: NEGATIVE
Resp Syncytial Virus by PCR: NEGATIVE
SARS Coronavirus 2 by RT PCR: NEGATIVE

## 2024-05-04 LAB — TSH: TSH: 1.409 u[IU]/mL (ref 0.350–4.500)

## 2024-05-04 LAB — TROPONIN I (HIGH SENSITIVITY)
Troponin I (High Sensitivity): 20 ng/L — ABNORMAL HIGH (ref <18)
Troponin I (High Sensitivity): 17 ng/L (ref <18)

## 2024-05-04 LAB — MAGNESIUM: Magnesium: 1.8 mg/dL (ref 1.7–2.4)

## 2024-05-04 MED ORDER — INSULIN ASPART 100 UNIT/ML IJ SOLN: 0.0000 [IU] | Freq: Three times a day (TID) | INTRAMUSCULAR | Status: DC

## 2024-05-04 MED ORDER — GUAIFENESIN ER 600 MG PO TB12
600.0000 mg | ORAL_TABLET | Freq: Two times a day (BID) | ORAL | Status: DC
  Administered 2024-05-04 – 2024-05-07 (×6): 600 mg via ORAL
  Filled 2024-05-04 (×6): qty 1

## 2024-05-04 MED ORDER — SODIUM CHLORIDE 0.9% FLUSH: 3.0000 mL | INTRAVENOUS | Status: DC | PRN

## 2024-05-04 MED ORDER — FAMOTIDINE 20 MG PO TABS
20.0000 mg | ORAL_TABLET | Freq: Every day | ORAL | Status: DC
  Administered 2024-05-04 – 2024-05-06 (×3): 20 mg via ORAL
  Filled 2024-05-04 (×3): qty 1

## 2024-05-04 MED ORDER — TAMSULOSIN HCL 0.4 MG PO CAPS
0.4000 mg | ORAL_CAPSULE | Freq: Every day | ORAL | Status: DC
Start: 2024-05-05 — End: 2024-05-07
  Administered 2024-05-05 – 2024-05-07 (×3): 0.4 mg via ORAL
  Filled 2024-05-04 (×3): qty 1

## 2024-05-04 MED ORDER — SODIUM CHLORIDE 0.9% FLUSH
3.0000 mL | Freq: Two times a day (BID) | INTRAVENOUS | Status: DC
  Administered 2024-05-04 – 2024-05-07 (×6): 3 mL via INTRAVENOUS

## 2024-05-04 MED ORDER — APIXABAN 5 MG PO TABS
5.0000 mg | ORAL_TABLET | Freq: Two times a day (BID) | ORAL | Status: DC
Start: 2024-05-04 — End: 2024-05-07
  Administered 2024-05-04 – 2024-05-07 (×6): 5 mg via ORAL
  Filled 2024-05-04 (×6): qty 1

## 2024-05-04 MED ORDER — ONDANSETRON HCL 4 MG PO TABS: 4.0000 mg | ORAL_TABLET | Freq: Four times a day (QID) | ORAL | Status: DC | PRN

## 2024-05-04 MED ORDER — FUROSEMIDE 10 MG/ML IJ SOLN
40.0000 mg | Freq: Once | INTRAMUSCULAR | Status: AC
  Administered 2024-05-04: 40 mg via INTRAVENOUS
  Filled 2024-05-04: qty 4

## 2024-05-04 MED ORDER — FUROSEMIDE 10 MG/ML IJ SOLN
40.0000 mg | Freq: Two times a day (BID) | INTRAMUSCULAR | Status: DC
  Administered 2024-05-05 – 2024-05-06 (×3): 40 mg via INTRAVENOUS
  Filled 2024-05-04 (×3): qty 4

## 2024-05-04 MED ORDER — MYCOPHENOLATE MOFETIL 250 MG PO CAPS
250.0000 mg | ORAL_CAPSULE | Freq: Two times a day (BID) | ORAL | Status: DC
  Administered 2024-05-04 – 2024-05-07 (×6): 250 mg via ORAL
  Filled 2024-05-04 (×8): qty 1

## 2024-05-04 MED ORDER — LORATADINE 10 MG PO TABS
10.0000 mg | ORAL_TABLET | Freq: Every day | ORAL | Status: DC
Start: 2024-05-05 — End: 2024-05-07
  Administered 2024-05-05 – 2024-05-07 (×3): 10 mg via ORAL
  Filled 2024-05-04 (×3): qty 1

## 2024-05-04 MED ORDER — IPRATROPIUM BROMIDE 0.06 % NA SOLN: 1.0000 | Freq: Four times a day (QID) | NASAL | Status: DC | PRN

## 2024-05-04 MED ORDER — ACETAMINOPHEN 325 MG PO TABS: 650.0000 mg | ORAL_TABLET | Freq: Four times a day (QID) | ORAL | Status: DC | PRN

## 2024-05-04 MED ORDER — PYRIDOSTIGMINE BROMIDE 60 MG PO TABS
60.0000 mg | ORAL_TABLET | Freq: Four times a day (QID) | ORAL | Status: DC
  Administered 2024-05-04 – 2024-05-07 (×11): 60 mg via ORAL
  Filled 2024-05-04 (×11): qty 1

## 2024-05-04 MED ORDER — SODIUM CHLORIDE 0.9 % IV SOLN: 250.0000 mL | INTRAVENOUS | Status: AC | PRN

## 2024-05-04 MED ORDER — ACETAMINOPHEN 650 MG RE SUPP: 650.0000 mg | Freq: Four times a day (QID) | RECTAL | Status: DC | PRN

## 2024-05-04 MED ORDER — ONDANSETRON HCL 4 MG/2ML IJ SOLN: 4.0000 mg | Freq: Four times a day (QID) | INTRAMUSCULAR | Status: DC | PRN

## 2024-05-04 MED ORDER — DESMOPRESSIN ACETATE 0.1 MG PO TABS
0.1000 mg | ORAL_TABLET | Freq: Every day | ORAL | Status: DC
  Administered 2024-05-04 – 2024-05-06 (×3): 0.1 mg via ORAL
  Filled 2024-05-04 (×4): qty 1

## 2024-05-04 MED ORDER — HYDROCODONE-ACETAMINOPHEN 5-325 MG PO TABS: 1.0000 | ORAL_TABLET | ORAL | Status: DC | PRN

## 2024-05-04 NOTE — Assessment & Plan Note (Addendum)
 Now foley is in place Conitnue flomax 

## 2024-05-04 NOTE — Subjective & Objective (Signed)
 Patient has been short of breath for past 1 week Has been feeling tired history of irregular atrial fibrillation has a pacemaker He went for a orthopedic shot this morning and was told to go to emergency department because of shortness of breath. At baseline he does not use oxygen On arrival to [facility] patient was hypoxic in high 80s on room air He had not had any fevers or chills no nausea no vomiting no chest pain Patient takes Eliquis  for A-fib proBNP elevated to 3000 Workup worrisome for CHF work of breathing improved on oxygen Patient was given a dose of Lasix  while waiting for bed

## 2024-05-04 NOTE — ED Triage Notes (Signed)
 Endorses SHOB x1 week, worsening today. Fatigue, dizziness. Hx a-fib and HF, has pacemaker. Got a bone infusion shot this morning and recommended to come to ED d/t SHOB and WOB.

## 2024-05-04 NOTE — Assessment & Plan Note (Signed)
 Order sliding scale

## 2024-05-04 NOTE — ED Notes (Addendum)
 Condom cath pulled off by patient. Will try male purewick.

## 2024-05-04 NOTE — Assessment & Plan Note (Signed)
 Status post pacemaker

## 2024-05-04 NOTE — Assessment & Plan Note (Signed)
 Hold Coreg  6.25 mg p.o.given myasthenia continue simvastatin  40 mg daily Need to discuss MG with Neurology

## 2024-05-04 NOTE — H&P (Incomplete)
 David [name]. FMW:[id number] DOB: October 17, 1935 DOA: 05/04/2024     PCP: Clarice Nottingham, MD   Outpatient Specialists:   CARDS: Dr. Redell Shallow, MD Pulmonary   Dr. Darlean Neurology Dr. Suanne Urology Dr. Carolynn Patient arrived to ER on 05/04/24 at 1149 Referred by Attending Silvester Ales, MD   Patient coming from:    home Lives With family     Chief Complaint:   Chief Complaint  Patient presents with   Shortness of Breath    HPI: David [name]. is a 88 y.o. male with medical history significant of CAD, DM2 systolic CHF, complete heart block with pacemaker, HLD, HTN, iron deficiency anemia, LBBB, melanoma, myasthenia gravis NSVT, sleep apnea spinal stenosis tricuspid regurgitation aortic insufficiency history of GI bleed A-fib on Eliquis     Presented with worsening shortness of breath Patient has been short of breath for past 1 week Has been feeling tired history of irregular atrial fibrillation has a pacemaker He went for a orthopedic shot this morning and was told to go to emergency department because of shortness of breath. At baseline he does not use oxygen On arrival to [facility] patient was hypoxic in high 80s on room air He had not had any fevers or chills no nausea no vomiting no chest pain Patient takes Eliquis  for A-fib proBNP elevated to 3000 Workup worrisome for CHF work of breathing improved on oxygen Patient was given a dose of Lasix  while waiting for bed    He has been getting up at night to pee a lot, has been exhausted Wife says he has not been taking his lasix  at home   He is currently on Cellcept  and Pyridostigminine for Myasthenia Gravis His MG has been fairly stable but his speech has been a bit off He has been mumbling for the past 1 week That seems to be a change    Denies significant ETOH intake   Does not smoke      Regarding pertinent Chronic problems:     Hyperlipidemia - on statins vytorin  Lipid Panel     Component  Value Date/Time   CHOL 136 12/24/2022 0943   TRIG 69 12/24/2022 0943   HDL 55 12/24/2022 0943   CHOLHDL 2.5 12/24/2022 0943   LDLCALC 67 12/24/2022 0943   LABVLDL 14 12/24/2022 0943     HTN on Coreg , cozaar    chronic CHF diastolic/systolic/ combined - last echo  Recent Results (from the past 56199 hours)  ECHOCARDIOGRAM COMPLETE   Collection Time: 09/03/22 12:18 PM  Result Value   Area-P 1/2 5.66   S' Lateral 4.00   P 1/2 time 510   Est EF 40 - 45%   Narrative      ECHOCARDIOGRAM REPORT      IMPRESSIONS    1. Left ventricular ejection fraction, by estimation, is 40 to 45%. The left ventricle has mildly decreased function. The left ventricle demonstrates global hypokinesis. Left ventricular diastolic parameters are indeterminate.  2. Right ventricular systolic function is normal. The right ventricular size is normal. There is normal pulmonary artery systolic pressure.  3. The mitral valve is normal in structure. Trivial mitral valve regurgitation. No evidence of mitral stenosis.  4. The aortic valve is tricuspid. Aortic valve regurgitation is moderate. No aortic stenosis is present. Aortic regurgitation PHT measures 510 msec.  5. Aortic dilatation noted. There is moderate dilatation of the ascending aorta, measuring 45 mm.  6. The inferior vena cava is normal in size with greater  than 50% respiratory variability, suggesting right atrial pressure of 3 mmHg.           CAD  - On  statin, betablocker,                  -  followed by cardiology                    DM 2 -  Lab Results  Component Value Date   HGBA1C 6.6 (H) 10/24/2014    PO meds only      OSA  noncompliant with CPAP     A. Fib -   atrial fibrillation CHA2DS2 vas score  6    current  on anticoagulation with  Eliquis ,    -  Rate control:   Coreg        While in ER: Clinical Course as of 05/04/24 2008  Wed May 04, 2024  [id number] 88 year old male here with worsening shortness of breath over the last few days.   Was hypoxic on arrival.  Workup consistent with acute CHF.  Doing better on oxygen after had some diuresis.  Will need admission for further evaluation. [MB]    Clinical Course User Index [MB] Towana Ozell BROCKS, MD     Lab Orders         Resp panel by RT-PCR (RSV, Flu A&B, Covid) Anterior Nasal Swab         Basic metabolic panel         CBC with Differential         Brain natriuretic peptide         Urinalysis, Routine w reflex microscopic -Urine, Clean Catch     CXR - Cardiomegaly with increased densities in both lungs. Findings are concerning for pulmonary edema. 2. Possible small right pleural effusion.  Following Medications were ordered in ER: Medications  furosemide  (LASIX ) injection 40 mg (40 mg Intravenous Given 05/04/24 1336)    ___________    ED Triage Vitals [05/04/24 1156]  Encounter Vitals Group     BP 135/78     Girls Systolic BP Percentile      Girls Diastolic BP Percentile      Boys Systolic BP Percentile      Boys Diastolic BP Percentile      Pulse Rate 71     Resp (!) 24     Temp (!) 97.5 F (36.4 C)     Temp Source Oral     SpO2 (!) 88 %     Weight 200 lb (90.7 kg)     Height 5' 9 (1.753 m)     Head Circumference      Peak Flow      Pain Score 0     Pain Loc      Pain Education      Exclude from Growth Chart   UFJK(75)@     _________________________________________ Significant initial  Findings: Abnormal Labs Reviewed  BASIC METABOLIC PANEL WITH GFR - Abnormal; Notable for the following components:      Result Value   Glucose, Bld 191 (*)    All other components within normal limits  PRO BRAIN NATRIURETIC PEPTIDE - Abnormal; Notable for the following components:   Pro Brain Natriuretic Peptide 3,066.0 (*)    All other components within normal limits  URINALYSIS, ROUTINE W REFLEX MICROSCOPIC - Abnormal; Notable for the following components:   Specific Gravity, Urine 1.031 (*)    Glucose, UA >1,000 (*)    Protein,  ur TRACE (*)    All other  components within normal limits  _________________________ Troponin ordered   ECG: Ordered Personally reviewed and interpreted by me showing: HR : 64  Rhythm: Electronic ventricular pacemaker few PVCs similar to prior 8/25 QTC 426  The recent clinical data is shown below. Vitals:   05/04/24 1310 05/04/24 1700 05/04/24 1726 05/04/24 1856  BP: (!) 146/80 137/74  (!) 154/79  Pulse: (!) 55 60  60  Resp: (!) 23 (!) 23  20  Temp:   97.9 F (36.6 C) 98.1 F (36.7 C)  TempSrc:   Oral Oral  SpO2: 97% 97%  100%  Weight:      Height:       WBC     Component Value Date/Time   WBC 5.7 05/04/2024 1230   LYMPHSABS 1.3 05/04/2024 1230   LYMPHSABS 1.9 11/09/2023 1329   MONOABS 0.8 05/04/2024 1230   EOSABS 0.1 05/04/2024 1230   EOSABS 0.1 11/09/2023 1329   BASOSABS 0.0 05/04/2024 1230   BASOSABS 0.0 11/09/2023 1329    UA  no evidence of UTI   Urine analysis:    Component Value Date/Time   COLORURINE YELLOW 05/04/2024 1359   APPEARANCEUR CLEAR 05/04/2024 1359   LABSPEC 1.031 (H) 05/04/2024 1359   PHURINE 6.0 05/04/2024 1359   GLUCOSEU >1,000 (A) 05/04/2024 1359   HGBUR NEGATIVE 05/04/2024 1359   BILIRUBINUR NEGATIVE 05/04/2024 1359   KETONESUR NEGATIVE 05/04/2024 1359   PROTEINUR TRACE (A) 05/04/2024 1359   NITRITE NEGATIVE 05/04/2024 1359   LEUKOCYTESUR NEGATIVE 05/04/2024 1359    Results for orders placed or performed during the hospital encounter of 05/04/24  Resp panel by RT-PCR (RSV, Flu A&B, Covid) Anterior Nasal Swab     Status: None   Collection Time: 05/04/24 12:30 PM   Specimen: Anterior Nasal Swab  Result Value Ref Range Status   SARS Coronavirus 2 by RT PCR NEGATIVE NEGATIVE Final        Influenza A by PCR NEGATIVE NEGATIVE Final   Influenza B by PCR NEGATIVE NEGATIVE Final        Resp Syncytial Virus by PCR NEGATIVE NEGATIVE Final         __________________________________________________________ Recent Labs  Lab 05/04/24 1230  NA 144  K 3.8  CO2  29  GLUCOSE 191*  BUN 14  CREATININE 0.62  CALCIUM  9.2    Cr  stable,   Lab Results  Component Value Date   CREATININE 0.62 05/04/2024   CREATININE 0.62 (L) 03/09/2024   CREATININE 0.73 (L) 11/09/2023    No results for input(s): AST, ALT, ALKPHOS, BILITOT, PROT, ALBUMIN in the last 168 hours. Lab Results  Component Value Date   CALCIUM  9.2 05/04/2024     Plt: Lab Results  Component Value Date   PLT 162 05/04/2024   Recent Labs  Lab 05/04/24 1230  WBC 5.7  NEUTROABS 3.5  HGB 13.6  HCT 44.4  MCV 97.8  PLT 162    HG/HCT  stable,      Component Value Date/Time   HGB 13.6 05/04/2024 1230   HGB 14.7 03/09/2024 1313   HCT 44.4 05/04/2024 1230   HCT 45.0 03/09/2024 1313   MCV 97.8 05/04/2024 1230   MCV 94 03/09/2024 1313  _______________________________________________ Hospitalist was called for admission for  Acute on chronic congestive heart failure, unspecified heart failure type The following Work up has been ordered so far:  Orders Placed This Encounter  Procedures   Critical Care   Resp  panel by RT-PCR (RSV, Flu A&B, Covid) Anterior Nasal Swab   DG Chest 2 View   Basic metabolic panel   CBC with Differential   Brain natriuretic peptide   Urinalysis, Routine w reflex microscopic -Urine, Clean Catch   ED Cardiac monitoring   Cardiac Monitoring Continuous x 48 hours Indications for use: Other; Other indications for use: CHF exacarbation.   Insert foley catheter   Consult to hospitalist   ED Pulse oximetry, continuous   EKG 12-Lead   ED EKG   EKG   Insert peripheral IV   Admit to Inpatient (patient's expected length of stay will be greater than 2 midnights or inpatient only procedure)     OTHER Significant initial  Findings:  labs showing:     DM  labs:  HbA1C: Recent Labs    05/04/24 2033  HGBA1C 5.6       CBG (last 3)  Recent Labs    05/04/24 2231  GLUCAP 103*          Cultures: No results found for: SDES,  SPECREQUEST, CULT, REPTSTATUS   Radiological Exams on Admission: CT HEAD WO CONTRAST ( ) Result Date: 05/04/2024 EXAM: CT HEAD WITHOUT CONTRAST 05/04/2024 09:54:53 PM TECHNIQUE: CT of the head was performed without the administration of intravenous contrast. Automated exposure control, iterative reconstruction, and/or weight based adjustment of the mA/kV was utilized to reduce the radiation dose to as low as reasonably achievable. COMPARISON: CT head 04/21/2016 CLINICAL HISTORY: Motor neuron disease suspected. FINDINGS: BRAIN AND VENTRICLES: No acute hemorrhage. No evidence of acute infarct. No hydrocephalus. No extra-axial collection. No mass effect or midline shift. ORBITS: No acute abnormality. SINUSES: No acute abnormality. SOFT TISSUES AND SKULL: No skull fracture. IMPRESSION: 1. No acute intracranial abnormality. Electronically signed by: Gilmore Molt MD 05/04/2024 11:56 PM EDT RP Workstation: [id number]   DG Chest 2 View Result Date: 05/04/2024 CLINICAL DATA:  Shortness of breath.  Fatigue and dizziness. EXAM: CHEST - 2 VIEW COMPARISON:  03/09/2024 FINDINGS: Decreased lung volumes. Increased densities in both lungs are concerning for pulmonary edema. Cardiomegaly with a left chest dual lead pacemaker. Negative for a pneumothorax. Blunting at the right costophrenic angle and cannot exclude a small right pleural effusion. IMPRESSION: 1. Cardiomegaly with increased densities in both lungs. Findings are concerning for pulmonary edema. 2. Possible small right pleural effusion. Electronically Signed   By: Juliene Balder M.D.   On: 05/04/2024 13:20   _______________________________________________________________________________________________________ Latest  Blood pressure (!) 154/79, pulse 60, temperature 98.1 F (36.7 C), temperature source Oral, resp. rate 20, height 5' 9 (1.753 m), weight 90.7 kg, SpO2 100%.   Vitals  labs and radiology finding personally reviewed  Review of Systems:     Pertinent positives include:  shortness of breath at rest. dyspnea on exertion,  non-productive cough,  Constitutional:  No weight loss, night sweats, Fevers, chills, fatigue, weight loss  HEENT:  No headaches, Difficulty swallowing,Tooth/dental problems,Sore throat,  No sneezing, itching, ear ache, nasal congestion, post nasal drip,  Cardio-vascular:  No chest pain, Orthopnea, PND, anasarca, dizziness, palpitations.no Bilateral lower extremity swelling  GI:  No heartburn, indigestion, abdominal pain, nausea, vomiting, diarrhea, change in bowel habits, loss of appetite, melena, blood in stool, hematemesis Resp:  no No excess mucus, no productive cough, NoNo coughing up of blood.No change in color of mucus.No wheezing. Skin:  no rash or lesions. No jaundice GU:  no dysuria, change in color of urine, no urgency or frequency. No straining to urinate.  No flank pain.  Musculoskeletal:  No joint pain or no joint swelling. No decreased range of motion. No back pain.  Psych:  No change in mood or affect. No depression or anxiety. No memory loss.  Neuro: no localizing neurological complaints, no tingling, no weakness, no double vision, no gait abnormality, no slurred speech, no confusion  All systems reviewed and apart from HOPI all are negative _______________________________________________________________________________________________ Past Medical History:   Past Medical History:  Diagnosis Date   Aortic insufficiency    Ascending aorta dilation    Balance problem    CAD (coronary artery disease)    previous stent to LAD 2003   Diabetes mellitus without complication (HCC)    No longer taking diabetic medications at this time   GI bleeding    HFrEF (heart failure with reduced ejection fraction) (HCC)    History of complete heart block    History of kidney stones    Hyperlipidemia    Hypertension    Iron deficiency anemia    LBBB (left bundle branch block)    Melanoma (HCC)     Myasthenia gravis (HCC)    eyelids, finger, and toes   NSVT (nonsustained ventricular tachycardia) (HCC)    OSA (obstructive sleep apnea)    Does not wear CPAP.   Presence of permanent cardiac pacemaker    S/P cardiac pacemaker procedure, placement 10/24/14 Medtronic Adapta L model ADDRL 1  10/25/2014   Spinal stenosis    Severe   Tricuspid regurgitation    Past Surgical History:  Procedure Laterality Date   COLONOSCOPY     CORONARY ANGIOPLASTY WITH STENT PLACEMENT  03/01/2002   stenting to LAD - Dr. Bulah   CYSTOSCOPY/URETEROSCOPY/HOLMIUM LASER/STENT PLACEMENT Left 08/31/2019   Procedure: CYSTOSCOPY/RETROGRADE/STENT PLACEMENT;  Surgeon: Devere Lonni Righter, MD;  Location: [facility];  Service: Urology;  Laterality: Left;  ONY NEEDS 45 MIN   NM MYOCAR PERF WALL MOTION  11/27/2010   normal   PERMANENT PACEMAKER INSERTION N/A 10/24/2014   Procedure: PERMANENT PACEMAKER INSERTION;  Surgeon: Lynwood Rakers, MD;  Location: [facility] CATH LAB;  Service: Cardiovascular;  Laterality: N/A;   TONSILLECTOMY     TRANSURETHRAL RESECTION OF PROSTATE N/A 07/06/2019   Procedure: TRANSURETHRAL RESECTION OF THE PROSTATE (TURP), BIPOLAR;  Surgeon: Devere Lonni Righter, MD;  Location: [facility];  Service: Urology;  Laterality: N/A;   US  ECHOCARDIOGRAPHY  12/05/2010   proximal septal thickening,mild concentric LVH,mild deptal hypokinesis,RV mildly dilated,LA mildly dilated,mild AI,moderate aortic root dilatation,septal motion c/w conduction abnormality    Social History:  Ambulatory needs assistance    reports that he quit smoking about 41 years ago. His smoking use included cigarettes and cigars. He started smoking about 75 years ago. He has never used smokeless tobacco. He reports that he does not currently use alcohol . He reports that he does not use drugs.     Family History:   Family History  Problem Relation Age of Onset   Breast cancer Mother    Colon cancer  Father    Lung cancer Father    ______________________________________________________________________________________________ Allergies: Allergies  Allergen Reactions   Azithromycin Other (See Comments)   Clindamycin Hcl Other (See Comments)   Doxycycline Diarrhea   Doxycycline Hyclate Other (See Comments)   Levofloxacin Other (See Comments)   Niacin Other (See Comments)   Gris-Peg [Griseofulvin] Other (See Comments)    headaches   Terbinafine Other (See Comments)    headache   Prior to Admission medications   Medication Sig Start Date End  Date Taking? Authorizing Provider  apixaban  (ELIQUIS ) 5 MG TABS tablet Take 1 tablet (5 mg total) by mouth 2 (two) times daily. 02/04/24   Pietro Redell RAMAN, MD  carvedilol  (COREG ) 6.25 MG tablet TAKE 1 TABLET(6.25 MG) BY MOUTH TWICE DAILY 02/16/24   Pietro Redell RAMAN, MD  Cholecalciferol (VITAMIN D3) 50 MCG (2000 UT) CAPS Take 1 capsule by mouth daily.    [provider]  desmopressin  (DDAVP ) 0.1 MG tablet TAKE 1 TABLET(0.1 MG TABLET) BY MOUTH AT BEDTIME. CALL [phone] TO SCHEDULE FOLLOW UP FOR ONGOING REFILLS. 11/30/23   Sater, Charlie LABOR, MD  empagliflozin  (JARDIANCE ) 10 MG TABS tablet TAKE 1 TABLET BY MOUTH EVERY DAY 03/29/24   Pietro Redell RAMAN, MD  ezetimibe -simvastatin  (VYTORIN ) 10-40 MG tablet Take 1 tablet by mouth daily.    [provider]  famotidine  (PEPCID ) 20 MG tablet One after supper 03/31/24   Darlean Ozell NOVAK, MD  furosemide  (LASIX ) 20 MG tablet Take 1 tablet (20 mg total) by mouth as needed for edema (for weight gain of 2-3lb and ankle edema). 03/11/21 03/31/24  McDaniel, Jill D, NP  ipratropium (ATROVENT ) 0.06 % nasal spray Up to 2 puffs every 6 hours as needed 03/31/24   Darlean Ozell NOVAK, MD  loratadine  (CLARITIN ) 10 MG tablet Take 10 mg by mouth daily.    [provider]  losartan  (COZAAR ) 25 MG tablet Take 0.5 tablets (12.5 mg total) by mouth daily. 03/09/24   Meng, Hao, PA  mycophenolate  (CELLCEPT ) 250 MG  capsule Take 1 capsule (250 mg total) by mouth 2 (two) times daily. 11/09/23   Sater, Charlie LABOR, MD  MYRBETRIQ  50 MG TB24 tablet Take 50 mg by mouth daily. 08/21/20   [provider]  nitroGLYCERIN  (NITROSTAT ) 0.4 MG SL tablet Place 1 tablet (0.4 mg total) under the tongue every 5 (five) minutes as needed for chest pain. 04/19/21   Walker, Caitlin S, NP  pantoprazole  (PROTONIX ) 40 MG tablet Take 1 tablet (40 mg total) by mouth daily. Take 30-60 min before first meal of the day 03/31/24   Darlean Ozell NOVAK, MD  Polyethyl Glycol-Propyl Glycol (SYSTANE) 0.4-0.3 % SOLN Place 1 drop into both eyes daily as needed (dry eyes).    [provider]  pyridostigmine  (MESTINON ) 60 MG tablet Take 1 tablet (60 mg total) by mouth 5 (five) times daily. 11/09/23   Sater, Charlie LABOR, MD  tamsulosin  (FLOMAX ) 0.4 MG CAPS capsule Take 1 capsule (0.4 mg total) by mouth daily. 03/12/21   Arvil Poe D, NP    ___________________________________________________________________________________________________ Physical Exam:    05/04/2024    6:56 PM 05/04/2024    5:00 PM 05/04/2024    1:10 PM  Vitals with BMI  Systolic 154 137 853  Diastolic 79 74 80  Pulse 60 60 55     1. General:  in No  Acute distress   Chronically ill-appearing 2. Psychological: Alert and   Oriented 3. Head/ENT:   Dry Mucous Membranes                          Head Non traumatic, neck supple                           Poor Dentition 4. SKIN: normal  Skin turgor,  Skin clean Dry and intact no rash    5. Heart: Regular rate and rhythm no  Murmur, no Rub or gallop 6. Lungs:  some crackles   7. Abdomen: Soft,  non-tender, Non distended   obese  bowel sounds present 8. Lower extremities: no clubbing, cyanosis, some edema 9. Neurologically Grossly intact, moving all 4 extremities equally  10. MSK: Normal range of motion    Chart has been  reviewed  ______________________________________________________________________________________________  Assessment/Plan  88 y.o. male with medical history significant of CAD, DM2 systolic CHF, complete heart block with pacemaker, HLD, HTN, iron deficiency anemia, LBBB, melanoma, myasthenia gravis NSVT, sleep apnea spinal stenosis tricuspid regurgitation aortic insufficiency history of GI bleed A-fib on Eliquis    Admitted for   Acute on chronic congestive heart failure,      Present on Admission:  Acute HFrEF (heart failure with reduced ejection fraction) (HCC)  BPH with obstruction/lower urinary tract symptoms  CAD in native artery  CHB (complete heart block), symptomatic  Myasthenia gravis (HCC)  Type 2 diabetes mellitus with unspecified complications (HCC)  Acute urinary retention  OSA (obstructive sleep apnea)     Acute HFrEF (heart failure with reduced ejection fraction) (HCC) - Pt diagnosed with CHF based on presence of the following: , rales on exam  Pulmonary edema on CXR, and   bilateral leg edema   With noted response to IV diuretic in ER  admit on telemetry,  cycle cardiac enzymes,     obtain serial ECG  to evaluate for ischemia as a cause of heart failure  monitor daily weight:  Filed Weights   05/04/24 1156  Weight: 90.7 kg       diurese with IV lasix    40 mg IV BID   and monitor orthostatics and creatinine to avoid over diuresis.  Order echogram to evaluate EF and valves    BPH with obstruction/lower urinary tract symptoms Continue Flomax  0.4 mg daily  CAD in native artery Hold Coreg  6.25 mg p.o.given myasthenia continue simvastatin  40 mg daily Need to discuss MG with Neurology   CHB (complete heart block), symptomatic Status post pacemaker  Myasthenia gravis (HCC) Continue Mestinon  60 mg 5 times daily and cellcept   Type 2 diabetes mellitus with unspecified complications (HCC) Family denies HgA1c < 6 Hold off on sliding scale for now  Acute  urinary retention Now foley is in place Conitnue flomax   OSA (obstructive sleep apnea) No longer on CPAP   Other plan as per orders.  DVT prophylaxis:  SCD      Code Status:    Code Status: Prior FULL CODE  as per patient  I had personally discussed CODE STATUS with patient and family  ACP   none    Family Communication:   Family   at  Bedside  plan of care was discussed   with   Wife,    Diet heart healthy   Disposition Plan:       To home once workup is complete and patient is stable   Following barriers for discharge:                                                         Electrolytes corrected  Will need consultants to evaluate patient prior to discharge                           Work of breathing improves       Consult Orders  (From admission, onward)           Start     Ordered   05/04/24 1337  Consult to hospitalist  Called [facility]--Leslie  Once       Provider:  (Not yet assigned)  Question Answer Comment  Place call to: [facility]   Reason for Consult Admit      05/04/24 1336                               Would benefit from PT/OT eval prior to DC  Ordered                   Swallow eval - SLP ordered                   Consults called:   Neurology is aware   Admission status:  ED Disposition     ED Disposition  Admit   Condition  --   Comment  Hospital Area: [facility] [[id number]]  Level of Care: Telemetry Cardiac [103]  May admit patient to [facility] or [facility] if equivalent level of care is available:: Yes  Interfacility transfer: Yes  Covid Evaluation: Asymptomatic - no recent exposure (last 10 days) testing not required  Diagnosis: Acute exacerbation of CHF (congestive heart failure) [facility]) [[id number]]  Admitting Physician: FERNAND PROST [[id number]]  Attending Physician: FERNAND PROST [[id number]]  Certification:: I certify this patient will need inpatient  services for at least 2 midnights  Expected Medical Readiness: 05/06/2024            inpatient     I Expect 2 midnight stay secondary to severity of patient's current illness need for inpatient interventions justified by the following:  hemodynamic instability despite optimal treatment ( tachypnea  hypoxia,  )   Severe lab/radiological/exam abnormalities including:    Acute on chronic congestive heart failure,    and extensive comorbidities including:  DM2    CHF   CAD   That are currently affecting medical management.   I expect  patient to be hospitalized for 2 midnights requiring inpatient medical care.  Patient is at high risk for adverse outcome (such as loss of life or disability) if not treated.  Indication for inpatient stay as follows:  Hemodynamic instability despite maximal medical therapy,   New or worsening hypoxia    Need for IV diuretics    Level of care     tele  For   24H      Cleveland Paiz 05/05/2024, 1:27 AM    [facility]     after 2 AM please page floor coverage   If 7AM-7PM, please contact the day team taking care of the patient using [email or web address]

## 2024-05-04 NOTE — ED Notes (Signed)
 Pt SOB and increased work of breathing. Placing a condom cath due to the lasix  and SOB so pt does not have to get up to use the bathroom.

## 2024-05-04 NOTE — ED Notes (Signed)
[facility] here to transfer pt  

## 2024-05-04 NOTE — ED Notes (Signed)
 Visitor called out saying pt's bed is wet, Clinical research associate and RN Alan went to pt's room and cleaned pt and replaced bed sheets, pad, and condom catherine. Pt repositioned and call bell in reach.

## 2024-05-04 NOTE — ED Notes (Signed)
 Called [facility] to transport the patient to [facility] rm# 9

## 2024-05-04 NOTE — Assessment & Plan Note (Signed)
-  Continue Flomax 0.4 mg daily

## 2024-05-04 NOTE — ED Notes (Signed)
 Pt reports does not use oxygen at home

## 2024-05-04 NOTE — ED Notes (Signed)
 Pt pulled off male purewick. Pt expressing concerns about not being able to urinate in the bed.

## 2024-05-04 NOTE — ED Provider Notes (Addendum)
 [place] EMERGENCY DEPARTMENT AT [facility] Provider Note   CSN: [id number] Arrival date & time: 05/04/24  1149     Patient presents with: Shortness of Breath   David [name]. is a 88 y.o. male.  History of heart failure, CAD, hypertension, diabetes, myasthenia gravis, and A-fib presents today with 1 week of worsening shortness of breath.  Patient also reports generalized fatigue and generalized weakness.  Patient has also had a cough for approximate 1 week.  Patient currently satting at mid to high 80s on room air.  Patient denies fever, chills, nausea, vomiting, chest pain, numbness, unilateral weakness, confusion, or any other complaints at this time.    Shortness of Breath Associated symptoms: cough        Prior to Admission medications   Medication Sig Start Date End Date Taking? Authorizing Provider  apixaban  (ELIQUIS ) 5 MG TABS tablet Take 1 tablet (5 mg total) by mouth 2 (two) times daily. 02/04/24   Pietro Redell RAMAN, MD  carvedilol  (COREG ) 6.25 MG tablet TAKE 1 TABLET(6.25 MG) BY MOUTH TWICE DAILY 02/16/24   Pietro Redell RAMAN, MD  Cholecalciferol (VITAMIN D3) 50 MCG (2000 UT) CAPS Take 1 capsule by mouth daily.    [provider]  desmopressin  (DDAVP ) 0.1 MG tablet TAKE 1 TABLET(0.1 MG TABLET) BY MOUTH AT BEDTIME. CALL [phone] TO SCHEDULE FOLLOW UP FOR ONGOING REFILLS. 11/30/23   Sater, Charlie LABOR, MD  empagliflozin  (JARDIANCE ) 10 MG TABS tablet TAKE 1 TABLET BY MOUTH EVERY DAY 03/29/24   Pietro Redell RAMAN, MD  ezetimibe -simvastatin  (VYTORIN ) 10-40 MG tablet Take 1 tablet by mouth daily.    [provider]  famotidine  (PEPCID ) 20 MG tablet One after supper 03/31/24   Darlean Ozell NOVAK, MD  furosemide  (LASIX ) 20 MG tablet Take 1 tablet (20 mg total) by mouth as needed for edema (for weight gain of 2-3lb and ankle edema). 03/11/21 03/31/24  McDaniel, Jill D, NP  ipratropium (ATROVENT ) 0.06 % nasal spray Up to 2 puffs every 6 hours as needed 03/31/24    Darlean Ozell NOVAK, MD  loratadine  (CLARITIN ) 10 MG tablet Take 10 mg by mouth daily.    [provider]  losartan  (COZAAR ) 25 MG tablet Take 0.5 tablets (12.5 mg total) by mouth daily. 03/09/24   Meng, Hao, PA  mycophenolate  (CELLCEPT ) 250 MG capsule Take 1 capsule (250 mg total) by mouth 2 (two) times daily. 11/09/23   Sater, Charlie LABOR, MD  MYRBETRIQ  50 MG TB24 tablet Take 50 mg by mouth daily. 08/21/20   [provider]  nitroGLYCERIN  (NITROSTAT ) 0.4 MG SL tablet Place 1 tablet (0.4 mg total) under the tongue every 5 (five) minutes as needed for chest pain. 04/19/21   Walker, Caitlin S, NP  pantoprazole  (PROTONIX ) 40 MG tablet Take 1 tablet (40 mg total) by mouth daily. Take 30-60 min before first meal of the day 03/31/24   Darlean Ozell NOVAK, MD  Polyethyl Glycol-Propyl Glycol (SYSTANE) 0.4-0.3 % SOLN Place 1 drop into both eyes daily as needed (dry eyes).    [provider]  pyridostigmine  (MESTINON ) 60 MG tablet Take 1 tablet (60 mg total) by mouth 5 (five) times daily. 11/09/23   Sater, Charlie LABOR, MD  tamsulosin  (FLOMAX ) 0.4 MG CAPS capsule Take 1 capsule (0.4 mg total) by mouth daily. 03/12/21   McDaniel, Jill D, NP    Allergies: Azithromycin, Clindamycin hcl, Doxycycline, Doxycycline hyclate, Levofloxacin, Niacin, Gris-peg [griseofulvin], and Terbinafine    Review of Systems  Constitutional:  Positive for fatigue.  Respiratory:  Positive for cough and shortness of breath.     Updated Vital Signs BP (!) 146/80   Pulse (!) 55   Temp (!) 97.5 F (36.4 C) (Oral)   Resp (!) 23   Ht 5' 9 (1.753 m)   Wt 90.7 kg   SpO2 97%   BMI 29.53 kg/m   Physical Exam Vitals and nursing note reviewed.  Constitutional:      General: He is not in acute distress.    Appearance: He is well-developed. He is ill-appearing. He is not toxic-appearing or diaphoretic.  HENT:     Head: Normocephalic and atraumatic.  Eyes:     Conjunctiva/sclera: Conjunctivae normal.  Cardiovascular:      Rate and Rhythm: Normal rate and regular rhythm.     Heart sounds: No murmur heard. Pulmonary:     Effort: Pulmonary effort is normal. Tachypnea present. No respiratory distress.     Breath sounds: Normal breath sounds. No wheezing.     Comments: Shallow inspiration, subtle crackling in lower lobes Abdominal:     Palpations: Abdomen is soft.     Tenderness: There is no abdominal tenderness.  Musculoskeletal:        General: No swelling.     Cervical back: Neck supple.     Right lower leg: Edema present.     Left lower leg: Edema present.     Comments: Bilateral lower extremity edema without pitting.  Skin:    General: Skin is warm and dry.     Capillary Refill: Capillary refill takes less than 2 seconds.  Neurological:     General: No focal deficit present.     Mental Status: He is alert.  Psychiatric:        Mood and Affect: Mood normal.     (all labs ordered are listed, but only abnormal results are displayed) Labs Reviewed  BASIC METABOLIC PANEL WITH GFR - Abnormal; Notable for the following components:      Result Value   Glucose, Bld 191 (*)    All other components within normal limits  PRO BRAIN NATRIURETIC PEPTIDE - Abnormal; Notable for the following components:   Pro Brain Natriuretic Peptide 3,066.0 (*)    All other components within normal limits  URINALYSIS, ROUTINE W REFLEX MICROSCOPIC - Abnormal; Notable for the following components:   Specific Gravity, Urine 1.031 (*)    Glucose, UA >1,000 (*)    Protein, ur TRACE (*)    All other components within normal limits  RESP PANEL BY RT-PCR (RSV, FLU A&B, COVID)  [id number]  CBC WITH DIFFERENTIAL/PLATELET    EKG: EKG Interpretation Date/Time:  Wednesday May 04 2024 11:58:03 EDT Ventricular Rate:  64 PR Interval:    QRS Duration:  145 QT Interval:  412 QTC Calculation: 426 R Axis:   -73  Text Interpretation: Electronic ventricular pacemaker few PVCs similar to prior 8/25 Confirmed by Towana Sharper  [phone]) on 05/04/2024 12:03:39 PM  Radiology: DG Chest 2 View Result Date: 05/04/2024 CLINICAL DATA:  Shortness of breath.  Fatigue and dizziness. EXAM: CHEST - 2 VIEW COMPARISON:  03/09/2024 FINDINGS: Decreased lung volumes. Increased densities in both lungs are concerning for pulmonary edema. Cardiomegaly with a left chest dual lead pacemaker. Negative for a pneumothorax. Blunting at the right costophrenic angle and cannot exclude a small right pleural effusion. IMPRESSION: 1. Cardiomegaly with increased densities in both lungs. Findings are concerning for pulmonary edema. 2. Possible small right pleural effusion. Electronically Signed  By: Juliene Balder M.D.   On: 05/04/2024 13:20     .Critical Care  Performed by: Francis Ileana SAILOR, PA-C Authorized by: Francis Ileana SAILOR, PA-C   Critical care provider statement:    Critical care time (minutes):  30   Critical care was necessary to treat or prevent imminent or life-threatening deterioration of the following conditions:  Respiratory failure   Critical care was time spent personally by me on the following activities:  Development of treatment plan with patient or surrogate, discussions with consultants, evaluation of patient's response to treatment, examination of patient, ordering and review of laboratory studies, ordering and review of radiographic studies, ordering and performing treatments and interventions, pulse oximetry, re-evaluation of patient's condition and review of old charts   Care discussed with: admitting provider      Medications Ordered in the ED  furosemide  (LASIX ) injection 40 mg (40 mg Intravenous Given 05/04/24 1336)    Clinical Course as of 05/04/24 1541  Wed May 04, 2024  3031 88 year old male here with worsening shortness of breath over the last few days.  Was hypoxic on arrival.  Workup consistent with acute CHF.  Doing better on oxygen after had some diuresis.  Will need admission for further evaluation. [MB]    Clinical  Course User Index [MB] Towana Ozell BROCKS, MD                                 Medical Decision Making Amount and/or Complexity of Data Reviewed Labs: ordered. Radiology: ordered.  Risk Prescription drug management. Decision regarding hospitalization.   This patient presents to the ED for concern of fatigue and shortness of breath, this involves an extensive number of treatment options, and is a complaint that carries with it a high risk of complications and morbidity.  The differential diagnosis includes CHF, pneumonia, COVID, flu, RSV,   Co morbidities / Chronic conditions that complicate the patient evaluation  CHF, CAD, hypertension   Additional history obtained:  Additional history obtained from EMR External records from outside source obtained and reviewed including everywhere   Lab Tests:  I Ordered, and personally interpreted labs.  The pertinent results include: CBC unremarkable, proBNP 3,066, BMP with elevated glucose at 191, negative respiratory panel   Imaging Studies ordered:  I ordered imaging studies including chest x-ray I independently visualized and interpreted imaging which showed cardiomegaly with increased densities in both lungs, findings are concerning for pulmonary edema.  Possible right small pleural effusion I agree with the radiologist interpretation   Cardiac Monitoring: / EKG:  The patient was maintained on a cardiac monitor.  I personally viewed and interpreted the cardiac monitored which showed an underlying rhythm of: Electronic ventricular pacemaker, few PVCs, similar to previous tracings   Problem List / ED Course / Critical interventions / Medication management  I ordered medication including Lasix  I have reviewed the patients home medicines and have made adjustments as needed   Consultations Obtained:  I requested consultation with the Hospitalist, Dr. Fernand and discussed lab and imaging findings as well as pertinent plan - they  recommend: admit   Test / Admission - Considered:  Admit for CHF exacerbation     Final diagnoses:  Acute on chronic congestive heart failure, unspecified heart failure type [facility])    ED Discharge Orders     None          Francis Ileana SAILOR, PA-C 05/04/24 1418  Francis Ileana SAILOR, PA-C 05/04/24 1541    Towana Ozell BROCKS, MD 05/04/24 [phone]

## 2024-05-04 NOTE — Assessment & Plan Note (Signed)
-   Pt diagnosed with CHF based on presence of the following: , rales on exam  Pulmonary edema on CXR, and   bilateral leg edema   With noted response to IV diuretic in ER  admit on telemetry,  cycle cardiac enzymes,     obtain serial ECG  to evaluate for ischemia as a cause of heart failure  monitor daily weight:  Filed Weights   05/04/24 1156  Weight: 90.7 kg       diurese with IV lasix    40 mg IV BID   and monitor orthostatics and creatinine to avoid over diuresis.  Order echogram to evaluate EF and valves

## 2024-05-04 NOTE — Assessment & Plan Note (Signed)
 Continue Mestinon  60 mg 5 times daily

## 2024-05-05 ENCOUNTER — Inpatient Hospital Stay (HOSPITAL_COMMUNITY)

## 2024-05-05 DIAGNOSIS — G7000 Myasthenia gravis without (acute) exacerbation: Secondary | ICD-10-CM | POA: Diagnosis not present

## 2024-05-05 DIAGNOSIS — I5021 Acute systolic (congestive) heart failure: Secondary | ICD-10-CM | POA: Diagnosis not present

## 2024-05-05 LAB — CBC WITH DIFFERENTIAL/PLATELET
Abs Immature Granulocytes: 0.02 K/uL (ref 0.00–0.07)
Basophils Absolute: 0 K/uL (ref 0.0–0.1)
Basophils Relative: 0 %
Eosinophils Absolute: 0.1 K/uL (ref 0.0–0.5)
Eosinophils Relative: 1 %
HCT: 44.4 % (ref 39.0–52.0)
Hemoglobin: 13.4 g/dL (ref 13.0–17.0)
Immature Granulocytes: 0 %
Lymphocytes Relative: 21 %
Lymphs Abs: 1.5 K/uL (ref 0.7–4.0)
MCH: 29.5 pg (ref 26.0–34.0)
MCHC: 30.2 g/dL (ref 30.0–36.0)
MCV: 97.8 fL (ref 80.0–100.0)
Monocytes Absolute: 0.9 K/uL (ref 0.1–1.0)
Monocytes Relative: 14 %
Neutro Abs: 4.3 K/uL (ref 1.7–7.7)
Neutrophils Relative %: 64 %
Platelets: 160 K/uL (ref 150–400)
RBC: 4.54 MIL/uL (ref 4.22–5.81)
RDW: 13.8 % (ref 11.5–15.5)
WBC: 6.8 K/uL (ref 4.0–10.5)
nRBC: 0 % (ref 0.0–0.2)

## 2024-05-05 LAB — COMPREHENSIVE METABOLIC PANEL WITH GFR
ALT: 12 U/L (ref 0–44)
AST: 20 U/L (ref 15–41)
Albumin: 3.1 g/dL — ABNORMAL LOW (ref 3.5–5.0)
Alkaline Phosphatase: 49 U/L (ref 38–126)
Anion gap: 10 (ref 5–15)
BUN: 10 mg/dL (ref 8–23)
CO2: 31 mmol/L (ref 22–32)
Calcium: 8.4 mg/dL — ABNORMAL LOW (ref 8.9–10.3)
Chloride: 101 mmol/L (ref 98–111)
Creatinine, Ser: 0.57 mg/dL — ABNORMAL LOW (ref 0.61–1.24)
GFR, Estimated: >60 mL/min (ref >60)
Glucose, Bld: 97 mg/dL (ref 70–99)
Potassium: 3.7 mmol/L (ref 3.5–5.1)
Sodium: 142 mmol/L (ref 135–145)
Total Bilirubin: 2.9 mg/dL — ABNORMAL HIGH (ref 0.0–1.2)
Total Protein: 5.7 g/dL — ABNORMAL LOW (ref 6.5–8.1)

## 2024-05-05 LAB — ECHOCARDIOGRAM COMPLETE
Area-P 1/2: 4.01 cm2
Height: 69 in
S' Lateral: 3.41 cm
Weight: 3072 [oz_av]

## 2024-05-05 LAB — PHOSPHORUS: Phosphorus: 3.8 mg/dL (ref 2.5–4.6)

## 2024-05-05 LAB — MAGNESIUM: Magnesium: 1.8 mg/dL (ref 1.7–2.4)

## 2024-05-05 MED ORDER — PERFLUTREN LIPID MICROSPHERE
1.0000 mL | INTRAVENOUS | Status: AC | PRN
  Administered 2024-05-05: 3 mL via INTRAVENOUS

## 2024-05-05 NOTE — Plan of Care (Signed)

## 2024-05-05 NOTE — Evaluation (Signed)
 Clinical/Bedside Swallow Evaluation Patient Details  Name: David [name]. MRN: [id number] Date of Birth: 24-May-1936  Today's Date: 05/05/2024 Time: SLP Start Time (ACUTE ONLY): 1120 SLP Stop Time (ACUTE ONLY): 1145 SLP Time Calculation (min) (ACUTE ONLY): 25 min  Past Medical History:  Past Medical History:  Diagnosis Date   Aortic insufficiency    Ascending aorta dilation    Balance problem    CAD (coronary artery disease)    previous stent to LAD 2003   Diabetes mellitus without complication (HCC)    No longer taking diabetic medications at this time   GI bleeding    HFrEF (heart failure with reduced ejection fraction) (HCC)    History of complete heart block    History of kidney stones    Hyperlipidemia    Hypertension    Iron deficiency anemia    LBBB (left bundle branch block)    Melanoma (HCC)    Myasthenia gravis (HCC)    eyelids, finger, and toes   NSVT (nonsustained ventricular tachycardia) (HCC)    OSA (obstructive sleep apnea)    Does not wear CPAP.   Presence of permanent cardiac pacemaker    S/P cardiac pacemaker procedure, placement 10/24/14 Medtronic Adapta L model ADDRL 1  10/25/2014   Spinal stenosis    Severe   Tricuspid regurgitation    Past Surgical History:  Past Surgical History:  Procedure Laterality Date   COLONOSCOPY     CORONARY ANGIOPLASTY WITH STENT PLACEMENT  03/01/2002   stenting to LAD - Dr. Bulah   CYSTOSCOPY/URETEROSCOPY/HOLMIUM LASER/STENT PLACEMENT Left 08/31/2019   Procedure: CYSTOSCOPY/RETROGRADE/STENT PLACEMENT;  Surgeon: Devere Lonni Righter, MD;  Location: [facility];  Service: Urology;  Laterality: Left;  ONY NEEDS 45 MIN   NM MYOCAR PERF WALL MOTION  11/27/2010   normal   PERMANENT PACEMAKER INSERTION N/A 10/24/2014   Procedure: PERMANENT PACEMAKER INSERTION;  Surgeon: Lynwood Rakers, MD;  Location: [facility] CATH LAB;  Service: Cardiovascular;  Laterality: N/A;   TONSILLECTOMY     TRANSURETHRAL RESECTION OF  PROSTATE N/A 07/06/2019   Procedure: TRANSURETHRAL RESECTION OF THE PROSTATE (TURP), BIPOLAR;  Surgeon: Devere Lonni Righter, MD;  Location: [facility];  Service: Urology;  Laterality: N/A;   US  ECHOCARDIOGRAPHY  12/05/2010   proximal septal thickening,mild concentric LVH,mild deptal hypokinesis,RV mildly dilated,LA mildly dilated,mild AI,moderate aortic root dilatation,septal motion c/w conduction abnormality   HPI:  Patient is an 88 y.o. male with PMH: CAD, HTN, myasthenia gravis, CHB s/p PPM, CHF, OSA, SAH, DM-2. He had an OP MBS on 04/20/24 which reported: overall functional oropharyngeal swallow ability with trace penetration of ultrathin (1/2 barium, 1/2 water) consistency when taking large, sequential swallows.  He presented to the [facility] ED on 05/04/24 with SOB, CHF exacerbation and was hypoxic at 80's on RA. He was transferred to [facility]. CTH was negative for infarct or ICH. He takes Cellcept  and Pyridostigminine for his MG which has been fairly stable but his spouse has noticed his speech intelligibility having declined some. Neurology was consulted due to concern for worsening MG, who recommended every 6 hour monitoring of NIF and VC, elective intubation if difficulty clearing secretions or airway compromise, NPO until swallow evaluation completed.    Assessment / Plan / Recommendation  Clinical Impression  Patient presents with clinical s/s of dysphagia as per this bedside swallow evaluation, however suspect that he is at or near his baseline swallow function as per review of recent MBS (completed as an OP) on  04/20/24. This MBS reported penetration with larger sips of thin liquids but no aspiration. During today's BSE, patient exhibited mildly delayed throat clearing with thin liquids but no overt s/s aspiration with puree or mechanical soft solids. SLP does not suspect that patient has had a significant change in his swallow function since recent MBS and is  recommending initiate PO diet of regular solids, thin liquids. SLP will plan to check in at least once to ensure continued toleration. SLP Visit Diagnosis: Dysphagia, unspecified (R13.10)    Aspiration Risk  Mild aspiration risk    Diet Recommendation Regular;Thin liquid    Liquid Administration via: Cup;Straw Medication Administration: Whole meds with liquid Supervision: Patient able to self feed Compensations: Slow rate;Small sips/bites Postural Changes: Seated upright at 90 degrees    Other  Recommendations Oral Care Recommendations: Oral care BID     Assistance Recommended at Discharge    Functional Status Assessment Patient has had a recent decline in their functional status and demonstrates the ability to make significant improvements in function in a reasonable and predictable amount of time.  Frequency and Duration min 1 x/week  1 week       Prognosis Prognosis for improved oropharyngeal function: Good      Swallow Study   General Date of Onset: 05/04/24 HPI: Patient is an 88 y.o. male with PMH: CAD, HTN, myasthenia gravis, CHB s/p PPM, CHF, OSA, SAH, DM-2. He had an OP MBS on 04/20/24 which reported: overall functional oropharyngeal swallow ability with trace penetration of ultrathin (1/2 barium, 1/2 water) consistency when taking large, sequential swallows.  He presented to the [facility] ED on 05/04/24 with SOB, CHF exacerbation and was hypoxic at 80's on RA. He was transferred to [facility]. CTH was negative for infarct or ICH. He takes Cellcept  and Pyridostigminine for his MG which has been fairly stable but his spouse has noticed his speech intelligibility having declined some. Neurology was consulted due to concern for worsening MG, who recommended every 6 hour monitoring of NIF and VC, elective intubation if difficulty clearing secretions or airway compromise, NPO until swallow evaluation completed. Type of Study: Bedside Swallow Evaluation Previous Swallow  Assessment: MBS 04/20/24 Diet Prior to this Study: NPO Temperature Spikes Noted: No Respiratory Status: Nasal cannula History of Recent Intubation: No Behavior/Cognition: Alert;Cooperative;Pleasant mood Oral Cavity Assessment: Within Functional Limits Oral Care Completed by SLP: No Oral Cavity - Dentition: Adequate natural dentition Vision: Functional for self-feeding Self-Feeding Abilities: Able to feed self Patient Positioning: Upright in bed Baseline Vocal Quality: Normal Volitional Cough: Strong Volitional Swallow: Able to elicit    Oral/Motor/Sensory Function Overall Oral Motor/Sensory Function: Within functional limits   Ice Chips     Thin Liquid Thin Liquid: Impaired Presentation: Straw;Self Fed Pharyngeal  Phase Impairments: Throat Clearing - Delayed    Nectar Thick     Honey Thick     Puree Puree: Within functional limits Presentation: Self Fed;Spoon   Solid     Solid: Within functional limits      Norleen IVAR Blase, MA, CCC-SLP Speech Therapy

## 2024-05-05 NOTE — Evaluation (Signed)
 Physical Therapy Evaluation Patient Details Name: David [name]. MRN: [id number] DOB: 1936-01-02 Today's Date: 05/05/2024  History of Present Illness  88 yo M adm 05/04/24 with SOB, CHF exacerbation. PMH: CAD, HTN, myasthenia gravis, CHB s/p PPM, CHF, OSA, BPH, Lt foot drop, SAH, T2DM  Clinical Impression  Pt pleasant with flat affect, flexed trunk and reliance on wife for stability with walking. Pt holds furniture or wife for mobility at home but performed better with RW and educated for continued use at home and acutely. Pt with decreased balance, strength, gait and function who will benefit from acute therapy as well as OPPT to maximize function and independence to decrease burden of care. Pt required 2L throughout session to maintain SPO2 >90%, normally on RA at home.        If plan is discharge home, recommend the following: A little help with walking and/or transfers;A little help with bathing/dressing/bathroom;Assistance with cooking/housework;Assist for transportation   Can travel by private vehicle        Equipment Recommendations None recommended by PT  Recommendations for Other Services  OT consult    Functional Status Assessment Patient has had a recent decline in their functional status and demonstrates the ability to make significant improvements in function in a reasonable and predictable amount of time.     Precautions / Restrictions Precautions Precautions: Fall;Other (comment) Recall of Precautions/Restrictions: Impaired Precaution/Restrictions Comments: watch sats      Mobility  Bed Mobility Overal bed mobility: Modified Independent             General bed mobility comments: increased time, cues to initiate movement    Transfers Overall transfer level: Needs assistance   Transfers: Sit to/from Stand Sit to Stand: Contact guard assist           General transfer comment: cues for hand placement, safety and sequence to rise from bed and  recliner    Ambulation/Gait Ambulation/Gait assistance: Min assist Gait Distance (Feet): 120 Feet Assistive device: 1 person hand held assist Gait Pattern/deviations: Step-through pattern, Decreased stride length, Trunk flexed   Gait velocity interpretation: <1.8 ft/sec, indicate of risk for recurrent falls   General Gait Details: cues for posture, direction and safety with pt needing cues to be aware of fatigue and limit gait distance. Pt walked 120' holding wife's arm with min physical assist as well as guarding for balance and lines. pt then performed 20' in room with RW with increased stability and safety at [facility] with recommendation to continue RW use  Stairs            Wheelchair Mobility     Tilt Bed    Modified Rankin (Stroke Patients Only)       Balance Overall balance assessment: Needs assistance Sitting-balance support: No upper extremity supported, Feet supported Sitting balance-Leahy Scale: Fair     Standing balance support: Single extremity supported, Bilateral upper extremity supported, Reliant on assistive device for balance, During functional activity Standing balance-Leahy Scale: Poor Standing balance comment: UE support on wife and RW in standing                             Pertinent Vitals/Pain Pain Assessment Pain Assessment: No/denies pain    Home Living Family/patient expects to be discharged to:: Private residence Living Arrangements: Spouse/significant other Available Help at Discharge: Family;Available 24 hours/day Type of Home: House Home Access: Stairs to enter Entrance Stairs-Rails: Right Entrance Stairs-Number of Steps: 2  Home Layout: One level Home Equipment: Agricultural consultant (2 wheels);Shower seat      Prior Function Prior Level of Function : Needs assist             Mobility Comments: holds onto wife with walking ADLs Comments: sponge bathes independently, wife assist with showers     Extremity/Trunk  Assessment   Upper Extremity Assessment Upper Extremity Assessment: Generalized weakness    Lower Extremity Assessment Lower Extremity Assessment: Generalized weakness    Cervical / Trunk Assessment Cervical / Trunk Assessment: Kyphotic  Communication   Communication Communication: Impaired Factors Affecting Communication: Hearing impaired    Cognition Arousal: Alert Behavior During Therapy: Flat affect   PT - Cognitive impairments: Safety/Judgement                       PT - Cognition Comments: pt with limited response to questions and needing cues to respond, aware of place and self, decreased awareness of assist and safety Following commands: Intact       Cueing Cueing Techniques: Verbal cues     General Comments      Exercises     Assessment/Plan    PT Assessment Patient needs continued PT services  PT Problem List Decreased strength;Decreased activity tolerance;Decreased balance;Decreased mobility;Decreased knowledge of use of DME;Decreased safety awareness       PT Treatment Interventions DME instruction;Gait training;Functional mobility training;Therapeutic activities;Therapeutic exercise;Cognitive remediation;Balance training    PT Goals (Current goals can be found in the Care Plan section)  Acute Rehab PT Goals Patient Stated Goal: return home, watch WW[employer] movies PT Goal Formulation: With patient/family Time For Goal Achievement: 05/19/24 Potential to Achieve Goals: Good    Frequency Min 2X/week     Co-evaluation               AM-PAC PT 6 Clicks Mobility  Outcome Measure Help needed turning from your back to your side while in a flat bed without using bedrails?: None Help needed moving from lying on your back to sitting on the side of a flat bed without using bedrails?: None Help needed moving to and from a bed to a chair (including a wheelchair)?: A Little Help needed standing up from a chair using your arms (e.g., wheelchair or  bedside chair)?: A Little Help needed to walk in hospital room?: A Little Help needed climbing 3-5 steps with a railing? : A Little 6 Click Score: 20    End of Session Equipment Utilized During Treatment: Gait belt;Oxygen Activity Tolerance: Patient tolerated treatment well Patient left: in chair;with call bell/phone within reach;with chair alarm set;with family/visitor present Nurse Communication: Mobility status PT Visit Diagnosis: Other abnormalities of gait and mobility (R26.89);Difficulty in walking, not elsewhere classified (R26.2);Muscle weakness (generalized) (M62.81)    Time: 9270-9240 PT Time Calculation (min) (ACUTE ONLY): 30 min   Charges:   PT Evaluation $PT Eval Moderate Complexity: 1 Mod PT Treatments $Gait Training: 8-22 mins PT General Charges $$ ACUTE PT VISIT: 1 Visit         Lenoard SQUIBB, PT Acute Rehabilitation Services Office: [phone]   Jhair Witherington B Vivaan Helseth 05/05/2024, 8:45 AM

## 2024-05-05 NOTE — Discharge Instructions (Signed)
 Heart failure is worsened by too much salt and fluid in your body. When you have too much salt and fluid, this makes your heart work much harder. Because your heart is weaker than normal (heart failure) it can't keep up with the extra work. This causes the extra fluid to build up in your legs causing swelling and in your lungs causing you to be very short of breath. We have worked hard during your hospital stay to get the fluid and salt out of your body to reduce the work on your heart. You will continue on many medications that help your heart work better and also keep fluid off. It is very important that you continue to be compliant with your regimen to improve your heart function and quality of life.   Following are important information and instructions relative to your:  - Your latest measure of heart function: EF (ejection fraction) is 55-60% - ACE/ARB: Losartan  12.5 mg daily  - Beta Blocker: Coreg  6.25 mg BID  - Your other medicines (if any) are listed separately in the discharge instructions. - Continue not to smoke. Do not start smoking.                 If you have more questions about this, call 1-800-Quit-Now or              speak with your primary care physician (PCP). - Consume a low salt diet (less than 2 grams of sodium daily) - You should continue to be as active as tolerated. - Follow up with your PCP and / or cardiologist within 7 days (see 'Appointments' section). - Go home and weigh yourself. This is your dry weight. - You should weigh yourself daily. - You should take an extra dose of your diuretic (Lasix ) if the following occur:     - If your weight is up by 3 pounds in one day or 5 pounds in one week     - If you are experiencing more swelling than normal in both your ankles     - If you are experiencing gradually worsening trouble breathing. - If taking an extra dose of your diuretic does not give you relief and / or improve your weight, then please call your PCP and / or  cardiologist for instructions. - If you feel lightheaded or dizzy please do not take your lasix  and call your Cardiologist or primary care physician.

## 2024-05-05 NOTE — Progress Notes (Signed)
 Progress Note   Patient: David [name]. FMW:[id number] DOB: [birth year] DOA: 05/04/2024     1 DOS: the patient was seen and examined on 05/05/2024   Brief hospital course: The patient is a 88 year old male with a history of CAD s/p PIC to LAD in 2003, A-fib on Eliquis , HFmrEF (LVEF 40-45%), complete heart block status post PPM, diabetes mellitus, IDA, HLD, HTN, myasthenia gravis on Mestinon  and CellCept , OSA, who presented to the emergency department on 05/04/2024 complaining of worsening dyspnea and fatigue for the past week.  Notably, the patient was in clinic receiving Prolia  injection for osteoporosis when he was noted to have increased work of breathing and significant shortness of breath, and was recommended to present to the ED.  He presented to [facility] where he was noted to be hypoxic to the 80s on room air.  He was started on supplemental oxygen and diuresed with IV Lasix  with significant improvement in symptoms. He was admitted for acute on chronic CHF exacerbation.  Assessment and Plan:  #Acute hypoxic respiratory failure secondary to Acute CHF exacerbation #Acute on chronic CHF exacerbation - Patient presented with 1 week of worsening DOE and fatigue, noted to have pulmonary rales and bilateral pedal edema.  - Per discussion with the patient and his wife, the appears appears to not be compliant with or even aware any fluid or sodium restrictions that he should be adhering to.  Additionally, although they are meticulous and recording daily BP, HR, and weights, they were unaware of how to utilize his prescription for PRN Lasix . Recently returned from a cruise ship to [place]  where he reports consuming large amounts of seafood, after which he began to notice an increase in his weight, lower extremity swelling, and dyspnea on exertion. - Reports normal sleeping on his side using only 1 pillow - Was hypoxic down to 85 on RA in the ED, necessitating supplemental oxygen with  with titration up to 3 L nasal cannula to maintain appropriate SpO2 - CXR findings of cardiomegaly with increased densities in both lung fields concerning for pulmonary edema. - proBNP elevated to 3066 - Responded well to dose of IV Lasix  40 mg given in the ED - Echo (05/05/2024) shows improvement in LVEF to 55-60% (from prior 40 to 45% in 08/2022), no regional wall motion abnormalities, mild concentric left ventricular hypertrophy, mildly reduced RV systolic function, and mild aortic regurgitation. - Per chart review, patient's GDMT previously limited by symptomatic hypotension.  Was unable to tolerate spironolactone  or Entresto  even at lower dose, and was ultimately titrated down to low dose Losartan . His current GDMT regimen includes Coreg  6.25 mg twice daily, Jardiance  10 mg, and losartan  12.5 mg. These were held on admission. Will hold off on resuming now due to relatively lower blood pressures in the 90-110s today. - Continue diuresis with IV Lasix  40 mg BID - Wean O2 as tolerated to maintain SpO2 greater than 92% - Strict I's and O's - Daily weights - Patient's wife will need to be provided with clear written instructions on discharge for when to administer Lasix  and which of his GDMT to continue versus hold if not all are resumed on discharge - Measure proBNP and dry weight on discharge  #Myasthenia gravis - Continue Mestinon  60 mg QID and CellCept  - Neurology consulted -- recommended every 6 hour NIF's and VC -- monitor for VC less than 15 to 20 mL/kg and/or NIF less than - 20 cm/H2O.   #BPH with LUTS - Continue Flomax   0.4 mg daily and desmopressin  nightly  Subjective: Patient seen at bedside.  No acute events overnight per patient or nursing staff.  Patient's wife, [name], and son were at bedside.  Patient reports feeling well with significant improvement in shortness of breath and lower extremity swelling.  More history was obtained from the patient and his wife including the fact that  they were unaware of the importance of adhering to a sodium and fluid restriction, and were also not aware of his prescription for Lasix  and the criteria for when he would need to take it.  He reports that he just came back from a cruise to [place]  during which he ate a lot of lobster, which is when he started to notice worsening of his symptoms in his lower extremity swelling.  At this time, he denies any chest pain, abdominal pain, nausea, vomiting.  Physical Exam: Vitals:   05/04/24 2310 05/05/24 0425 05/05/24 0805 05/05/24 0833  BP: 135/77 (!) 149/90 128/70 128/70  Pulse: 61 62 60 60  Resp: 20 20 14 20   Temp: 97.6 F (36.4 C) (!) 97.5 F (36.4 C)  98.5 F (36.9 C)  TempSrc: Oral Oral  Axillary  SpO2: 95% 98% 96%   Weight:  87.1 kg    Height:       Physical Exam Constitutional:      General: He is not in acute distress.    Appearance: He is not ill-appearing.  HENT:     Mouth/Throat:     Mouth: Mucous membranes are moist.  Cardiovascular:     Rate and Rhythm: Normal rate and regular rhythm.     Heart sounds: Normal heart sounds. No murmur heard. Pulmonary:     Effort: Pulmonary effort is normal. No respiratory distress.     Breath sounds: Rales (Mild bibasilar rales) present. No wheezing.  Abdominal:     General: Bowel sounds are normal. There is no distension.     Palpations: Abdomen is soft.     Tenderness: There is no abdominal tenderness. There is no guarding.  Musculoskeletal:     Right lower leg: 1+ Edema present.     Left lower leg: 1+ Edema present.  Skin:    General: Skin is warm and dry.     Capillary Refill: Capillary refill takes less than 2 seconds.  Neurological:     Mental Status: He is alert and oriented to person, place, and time. Mental status is at baseline.    Family Communication: Discussed with the patient, his wife Ms. [name], and his son at bedside.  Significant amount of time was spent educating the patient and his wife on the importance of  compliance with sodium and fluid restrictions, monitoring daily weights, and when to take as needed Lasix .  Disposition: Status is: Inpatient Remains inpatient appropriate because: Acute hypoxic respiratory failure requiring supplemental oxygen and CHF exacerbation requiring continued diuresis  Planned Discharge Destination: Home   DVT Prophylaxis: apixaban  (ELIQUIS ) tablet 5 mg    Time spent: 50 minutes  Author: Dearion Huot Al-Sultani, MD 05/05/2024 12:16 PM  For on call review [email or web address].

## 2024-05-05 NOTE — Progress Notes (Signed)
 Patient VC AND NIF performed with good effort.  NIF -23 VC .74

## 2024-05-05 NOTE — TOC CM/SW Note (Addendum)
 Transition of Care [facility]) - Inpatient Brief Assessment   Patient Details  Name: David [name]. MRN: [id number] Date of Birth: 07-02-1936  Transition of Care [facility]) CM/SW Contact:    Waddell Barnie Rama, RN Phone Number: 05/05/2024, 4:19 PM   Clinical Narrative: From home with spouse, has PCP and insurance on file, states has no HH services in place at this time , has a cane at home.  States family member (wife)  will transport them home at [employer] and family is support system, states gets medications from [facility] on [address]/ [address]  Pta self ambulatory with walker.   Per pt eval rec outpatient pt and rolling walker.  NCM offered choice , he has no preference.  NCM made referral to Jermaine with [employer] for DME. Wife states they would like to go to [facility] for outpatient pt.  NCM has script for MD to sign for patient to take with to get apt.   10/10- NCM faxed script referral to [facility].    Transition of Care Asessment: Insurance and Status: Insurance coverage has been reviewed Patient has primary care physician: Yes Home environment has been reviewed: home with wife Prior level of function:: ambulatory with walker Prior/Current Home Services: Current home services (bp cuff, cane,) Social Drivers of Health Review: SDOH reviewed no interventions necessary Readmission risk has been reviewed: Yes Transition of care needs: transition of care needs identified, TOC will continue to follow

## 2024-05-05 NOTE — Evaluation (Signed)
 Occupational Therapy Evaluation Patient Details Name: David [name]. MRN: [id number] DOB: 1936-04-01 Today's Date: 05/05/2024   History of Present Illness   88 yo M adm 05/04/24 with SOB, CHF exacerbation. PMH: CAD, HTN, myasthenia gravis, CHB s/p PPM, CHF, OSA, BPH, Lt foot drop, SAH, T2DM     Clinical Impressions Pt admitted based on above, and was seen based on problem list below. PTA pt was mostly ind with ADLs, but receiving assistance with bathing. Today pt is requiring set up  to CGA for ADLs. Functional transfers are  CGA with use of RW. Anticipate pt will progress well no follow up OT needs. Will continue to follow acutely to improve functional independence with ADLs and increase activity tolerance given new O2 requirement.    If plan is discharge home, recommend the following:   A little help with walking and/or transfers;A little help with bathing/dressing/bathroom;Assistance with cooking/housework     Functional Status Assessment   Patient has had a recent decline in their functional status and demonstrates the ability to make significant improvements in function in a reasonable and predictable amount of time.     Equipment Recommendations   None recommended by OT      Precautions/Restrictions   Precautions Precautions: Fall;Other (comment) Recall of Precautions/Restrictions: Impaired Precaution/Restrictions Comments: watch sats Restrictions Weight Bearing Restrictions Per Provider Order: No     Mobility Bed Mobility Overal bed mobility: Modified Independent     General bed mobility comments: Increased time, from near flat bed    Transfers Overall transfer level: Needs assistance Equipment used: Rolling walker (2 wheels) Transfers: Sit to/from Stand Sit to Stand: Contact guard assist           General transfer comment: Cues for hand placement on RW      Balance Overall balance assessment: Needs assistance Sitting-balance support: No  upper extremity supported, Feet supported Sitting balance-Leahy Scale: Fair     Standing balance support: Bilateral upper extremity supported, During functional activity, Reliant on assistive device for balance Standing balance-Leahy Scale: Poor Standing balance comment: reliant on RW         ADL either performed or assessed with clinical judgement   ADL Overall ADL's : Needs assistance/impaired Eating/Feeding: Set up;Sitting   Grooming: Wash/dry hands;Wash/dry face;Oral care;Standing;Contact guard assist           Upper Body Dressing : Set up;Sitting   Lower Body Dressing: Contact guard assist;Sit to/from stand Lower Body Dressing Details (indicate cue type and reason): Able to figure 4 legs Toilet Transfer: Contact guard assist;Ambulation;Rolling walker (2 wheels)   Toileting- Clothing Manipulation and Hygiene: Contact guard assist;Sit to/from stand       Functional mobility during ADLs: Contact guard assist;Rolling walker (2 wheels) General ADL Comments: CGA for balance, increased time     Vision Patient Visual Report: No change from baseline Vision Assessment?: No apparent visual deficits            Pertinent Vitals/Pain Pain Assessment Pain Assessment: No/denies pain     Extremity/Trunk Assessment Upper Extremity Assessment Upper Extremity Assessment: Generalized weakness   Lower Extremity Assessment Lower Extremity Assessment: Defer to PT evaluation   Cervical / Trunk Assessment Cervical / Trunk Assessment: Kyphotic   Communication Communication Communication: Impaired Factors Affecting Communication: Hearing impaired   Cognition Arousal: Alert Behavior During Therapy: Flat affect Cognition: No apparent impairments     Following commands: Intact       Cueing  General Comments   Cueing Techniques: Verbal cues  VSS on 2L [place]           Home Living Family/patient expects to be discharged to:: Private residence Living Arrangements:  Spouse/significant other Available Help at Discharge: Family;Available 24 hours/day Type of Home: House Home Access: Stairs to enter [employer] of Steps: 2 Entrance Stairs-Rails: Right Home Layout: One level     Bathroom Shower/Tub: Producer, television/film/video: Handicapped height Bathroom Accessibility: Yes How Accessible: Accessible via walker Home Equipment: Rolling Walker (2 wheels);Shower seat          Prior Functioning/Environment Prior Level of Function : Needs assist             Mobility Comments: holds onto wife with walking ADLs Comments: Ind with most ADLs, wife assists with showering back and head    OT Problem List: Decreased strength;Decreased range of motion;Decreased activity tolerance;Impaired balance (sitting and/or standing);Decreased safety awareness;Cardiopulmonary status limiting activity   OT Treatment/Interventions: Self-care/ADL training;Therapeutic exercise;Energy conservation;DME and/or AE instruction;Therapeutic activities;Patient/family education;Balance training      OT Goals(Current goals can be found in the care plan section)   Acute Rehab OT Goals Patient Stated Goal: To go home OT Goal Formulation: With patient Time For Goal Achievement: 05/19/24 Potential to Achieve Goals: Good   OT Frequency:  Min 2X/week       AM-PAC OT 6 Clicks Daily Activity     Outcome Measure Help from another person eating meals?: None Help from another person taking care of personal grooming?: A Little Help from another person toileting, which includes using toliet, bedpan, or urinal?: A Little Help from another person bathing (including washing, rinsing, drying)?: A Little Help from another person to put on and taking off regular upper body clothing?: A Little Help from another person to put on and taking off regular lower body clothing?: A Little 6 Click Score: 19   End of Session Equipment Utilized During Treatment: Rolling  walker (2 wheels) Nurse Communication: Mobility status  Activity Tolerance: Patient tolerated treatment well Patient left: in bed;with call bell/phone within reach;with bed alarm set  OT Visit Diagnosis: Unsteadiness on feet (R26.81);Other abnormalities of gait and mobility (R26.89);Muscle weakness (generalized) (M62.81)                Time: 9061-9040 OT Time Calculation (min): 21 min Charges:  OT General Charges $OT Visit: 1 Visit OT Evaluation $OT Eval Moderate Complexity: 1 Mod  Adrianne BROCKS, OT  Acute Rehabilitation Services Office [phone] Secure chat preferred   Adrianne GORMAN Savers 05/05/2024, 11:10 AM

## 2024-05-05 NOTE — Plan of Care (Signed)
 Reassessment at 1600  States he is doing well this afternoon.  O2 titrated form 3L to 1L   Neuro: Mental Status: Patient is awake, alert, oriented to person, place, month, year, and situation. Patient is able to give a clear and coherent history. No signs of aphasia or neglect Counts higher than 20 on a single breath  Cranial Nerves: II: Visual Fields are full. Pupils are equal, round, and reactive to light.   III,IV, VI: EOMI without ptosis or diploplia.  V: Facial sensation is symmetric to temperature VII: Facial movement is symmetric resting and smiling VIII: Hearing is intact to voice X: Palate elevates symmetrically XI: Shoulder shrug is symmetric. XII: Tongue protrudes midline without atrophy or fasciculations.  Motor: Tone is normal. Bulk is normal. 5/5 strength was present in all four extremities.  Neck flexion 4+/5 Neck Extension 4+/5 Sensory: Sensation is symmetric to light touch and temperature in the arms and legs.    Jorene Last, DNP, FNP-BC [facility] Pager: [phone]

## 2024-05-05 NOTE — Progress Notes (Signed)
 NIF and VC performed with great effort   NIF -35 VC 1.3L

## 2024-05-05 NOTE — Progress Notes (Signed)
 SATURATION QUALIFICATIONS: (This note is used to comply with regulatory documentation for home oxygen)  Patient Saturations on Room Air at Rest = 87%  Patient Saturations on 2L at rest = 95%  Patient Saturations on 2 Liters of oxygen while Ambulating = 92%  Please briefly explain why patient needs home oxygen:Pt requires 2L at rest and with mobility to maintain SPO2 >90% Elizer Bostic P, PT Acute Rehabilitation Services Office: [phone]

## 2024-05-05 NOTE — Consult Note (Signed)
 NEUROLOGY CONSULT NOTE   Date of service: May 05, 2024 Patient Name: David [name]. MRN:  [id number] DOB:  January 22, 1936 Chief Complaint: Neurology evaluation requested for concern for worsening myasthenia gravis Requesting Provider: Doutova, Anastassia, MD  History of Present Illness  Ariez L Seamus Warehime. is a 88 y.o. male with hx of CAD, diabetes, systolic CHF, heart block status post pacemaker, hyperlipidemia, hypertension, myasthenia gravis on Mestinon  and CellCept , sleep apnea, GI  A-fib on Eliquis  who presents with shortness of breath for about a week along with fatigue and dizziness.  Went to his doctor to get Prolia  injection for osteoporosis.  He was noted to have significant difficulty with walking and intensity short of breath at the doctor's office and sent to the ED.  He presented to [facility] ED where he was noted to be hypoxic to 80s on room air.  He was diuresed with little Lasix  with significant improvement.  proBNP was elevated to 3000.  He was admitted for CHF exacerbation.  As far as history of myasthenia gravis, patient has been compliant with CellCept  and pyridostigmine .  He does feel that his speech seems a bit off.  In the past, myasthenia has presented with difficulty with chewing and tightness of his jaw along with ptosis.  However, has had multiple surgeries of his eyes to help with ptosis.  Wife believes that his speech sounds a little off but it is much better than yesterday.  He also seems to be stronger than yesterday.    ROS  Comprehensive ROS performed and pertinent positives documented in HPI   Past History   Past Medical History:  Diagnosis Date   Aortic insufficiency    Ascending aorta dilation    Balance problem    CAD (coronary artery disease)    previous stent to LAD 2003   Diabetes mellitus without complication (HCC)    No longer taking diabetic medications at this time   GI bleeding    HFrEF (heart failure with reduced  ejection fraction) (HCC)    History of complete heart block    History of kidney stones    Hyperlipidemia    Hypertension    Iron deficiency anemia    LBBB (left bundle branch block)    Melanoma (HCC)    Myasthenia gravis (HCC)    eyelids, finger, and toes   NSVT (nonsustained ventricular tachycardia) (HCC)    OSA (obstructive sleep apnea)    Does not wear CPAP.   Presence of permanent cardiac pacemaker    S/P cardiac pacemaker procedure, placement 10/24/14 Medtronic Adapta L model ADDRL 1  10/25/2014   Spinal stenosis    Severe   Tricuspid regurgitation     Past Surgical History:  Procedure Laterality Date   COLONOSCOPY     CORONARY ANGIOPLASTY WITH STENT PLACEMENT  03/01/2002   stenting to LAD - Dr. Bulah   CYSTOSCOPY/URETEROSCOPY/HOLMIUM LASER/STENT PLACEMENT Left 08/31/2019   Procedure: CYSTOSCOPY/RETROGRADE/STENT PLACEMENT;  Surgeon: Devere Lonni Righter, MD;  Location: [facility];  Service: Urology;  Laterality: Left;  ONY NEEDS 45 MIN   NM MYOCAR PERF WALL MOTION  11/27/2010   normal   PERMANENT PACEMAKER INSERTION N/A 10/24/2014   Procedure: PERMANENT PACEMAKER INSERTION;  Surgeon: Lynwood Rakers, MD;  Location: [facility] CATH LAB;  Service: Cardiovascular;  Laterality: N/A;   TONSILLECTOMY     TRANSURETHRAL RESECTION OF PROSTATE N/A 07/06/2019   Procedure: TRANSURETHRAL RESECTION OF THE PROSTATE (TURP), BIPOLAR;  Surgeon: Devere Lonni Righter, MD;  Location:  [facility];  Service: Urology;  Laterality: N/A;   US  ECHOCARDIOGRAPHY  12/05/2010   proximal septal thickening,mild concentric LVH,mild deptal hypokinesis,RV mildly dilated,LA mildly dilated,mild AI,moderate aortic root dilatation,septal motion c/w conduction abnormality    Family History: Family History  Problem Relation Age of Onset   Breast cancer Mother    Colon cancer Father    Lung cancer Father     Social History  reports that he quit smoking about 41 years ago. His smoking  use included cigarettes and cigars. He started smoking about 75 years ago. He has never used smokeless tobacco. He reports that he does not currently use alcohol . He reports that he does not use drugs.  Allergies  Allergen Reactions   Azithromycin     Runny nose    Clindamycin Hcl     Runny nose    Doxycycline Diarrhea   Doxycycline Hyclate     unknown   Levofloxacin     unknown   Magnesium     Myasthenia gravis (Mg can precipitate MG crisis)   Niacin     unknown   Gris-Peg [Griseofulvin] Other (See Comments)    headaches   Terbinafine Other (See Comments)    headache    Medications   Current Facility-Administered Medications:    0.9 %  sodium chloride  infusion, 250 mL, Intravenous, PRN, Doutova, Anastassia, MD   acetaminophen  (TYLENOL ) tablet 650 mg, 650 mg, Oral, Q6H PRN **OR** acetaminophen  (TYLENOL ) suppository 650 mg, 650 mg, Rectal, Q6H PRN, Doutova, Anastassia, MD   apixaban  (ELIQUIS ) tablet 5 mg, 5 mg, Oral, BID, Doutova, Anastassia, MD, 5 mg at 05/04/24 2255   desmopressin  (DDAVP ) tablet 0.1 mg, 0.1 mg, Oral, QHS, Doutova, Anastassia, MD, 0.1 mg at 05/04/24 2259   famotidine  (PEPCID ) tablet 20 mg, 20 mg, Oral, QHS, Doutova, Anastassia, MD, 20 mg at 05/04/24 2255   furosemide  (LASIX ) injection 40 mg, 40 mg, Intravenous, Q12H, Doutova, Anastassia, MD   guaiFENesin (MUCINEX) 12 hr tablet 600 mg, 600 mg, Oral, BID, Doutova, Anastassia, MD, 600 mg at 05/04/24 2255   HYDROcodone -acetaminophen  (NORCO/VICODIN) 5-325 MG per tablet 1-2 tablet, 1-2 tablet, Oral, Q4H PRN, Doutova, Anastassia, MD   ipratropium (ATROVENT ) 0.06 % nasal spray 1 spray, 1 spray, Each Nare, Q6H PRN, Doutova, Anastassia, MD   loratadine  (CLARITIN ) tablet 10 mg, 10 mg, Oral, Daily, Doutova, Anastassia, MD   mycophenolate  (CELLCEPT ) capsule 250 mg, 250 mg, Oral, BID, Doutova, Anastassia, MD, 250 mg at 05/04/24 2259   ondansetron  (ZOFRAN ) tablet 4 mg, 4 mg, Oral, Q6H PRN **OR** ondansetron  (ZOFRAN ) injection 4  mg, 4 mg, Intravenous, Q6H PRN, Doutova, Anastassia, MD   pyridostigmine  (MESTINON ) tablet 60 mg, 60 mg, Oral, QID, Doutova, Anastassia, MD, 60 mg at 05/04/24 2255   sodium chloride  flush (NS) 0.9 % injection 3 mL, 3 mL, Intravenous, Q12H, Doutova, Anastassia, MD, 3 mL at 05/04/24 2300   sodium chloride  flush (NS) 0.9 % injection 3 mL, 3 mL, Intravenous, PRN, Doutova, Anastassia, MD   tamsulosin  (FLOMAX ) capsule 0.4 mg, 0.4 mg, Oral, Daily, Doutova, Anastassia, MD  Vitals   Vitals:   05/04/24 1856 05/04/24 2048 05/04/24 2310 05/05/24 0425  BP: (!) 154/79 134/76 135/77 (!) 149/90  Pulse: 60 (!) 54 61 62  Resp: 20 20 20 20   Temp: 98.1 F (36.7 C) 97.6 F (36.4 C) 97.6 F (36.4 C) (!) 97.5 F (36.4 C)  TempSrc: Oral Oral Oral Oral  SpO2: 100% 94% 95% 98%  Weight:    87.1 kg  Height:        Body mass index is 28.35 kg/m.   Physical Exam   General: Laying comfortably in bed; in no acute distress.  HENT: Normal oropharynx and mucosa. Normal external appearance of ears and nose.  Neck: Supple, no pain or tenderness  CV: No JVD. No peripheral edema.  Pulmonary: Symmetric Chest rise. Normal respiratory effort.  Abdomen: Soft to touch, non-tender.  Ext: No cyanosis, or deformity.  Edema noted in bilateral lower extremity. Skin: No rash. Normal palpation of skin.   Musculoskeletal: Normal digits and nails by inspection. No clubbing.   Neurologic Examination  Mental status/Cognition: Alert, oriented to self, place, month and year, good attention.  Speech/language: Fluent, comprehension intact, object naming intact, repetition intact.  Cranial nerves:   CN II Pupils equal and reactive to light, no VF deficits    CN III,IV,VI EOM intact, no gaze preference or deviation, no nystagmus    CN V normal sensation in V1, V2, and V3 segments bilaterally    CN VII no asymmetry, no nasolabial fold flattening    CN VIII normal hearing to speech    CN IX & X normal palatal elevation, no uvular  deviation    CN XI 5/5 head turn and 5/5 shoulder shrug bilaterally    CN XII midline tongue protrusion    Motor:  Muscle bulk: Normal, tone normal, pronator drift none tremor none. Neck flexion is 4 out of 5. Neck extension is 4 out of 5. Mvmt Root Nerve  Muscle Right Left Comments  SA C5/6 Ax Deltoid 5 5   EF C5/6 Mc Biceps 5 5   EE C6/7/8 Rad Triceps 5 5   WF C6/7 Med FCR     WE C7/8 PIN ECU     F Ab C8/T1 U ADM/FDI 5 5   HF L1/2/3 Fem Illopsoas 5 5   KE L2/3/4 Fem Quad 5 5   DF L4/5 D Peron Tib Ant 5 5   PF S1/2 Tibial Grc/Sol 5 5    Sensation:  Light touch Intact throughout   Pin prick    Temperature    Vibration   Proprioception    Coordination/Complex Motor:  - Finger to Nose intact bilaterally - Heel to shin intact bilaterally - Rapid alternating movement are normal - Gait: Deferred for patient safety.  Labs/Imaging/Neurodiagnostic studies   CBC:  Recent Labs  Lab May 24, 2024 1230 05/05/24 0243  WBC 5.7 6.8  NEUTROABS 3.5 4.3  HGB 13.6 13.4  HCT 44.4 44.4  MCV 97.8 97.8  PLT 162 160   Basic Metabolic Panel:  Lab Results  Component Value Date   NA 142 05/05/2024   K 3.7 05/05/2024   CO2 31 05/05/2024   GLUCOSE 97 05/05/2024   BUN 10 05/05/2024   CREATININE 0.57 (L) 05/05/2024   CALCIUM  8.4 (L) 05/05/2024   GFRNONAA >60 05/05/2024   GFRAA 105 11/17/2019   Lipid Panel:  Lab Results  Component Value Date   LDLCALC 67 12/24/2022   HgbA1c:  Lab Results  Component Value Date   HGBA1C 5.6 05/24/2024   Urine Drug Screen: No results found for: LABOPIA, COCAINSCRNUR, LABBENZ, AMPHETMU, THCU, LABBARB  Alcohol  Level No results found for: [facility] INR  Lab Results  Component Value Date   INR 1.00 04/21/2016   APTT  Lab Results  Component Value Date   APTT 31 04/21/2016   AED levels: No results found for: PHENYTOIN, ZONISAMIDE, LAMOTRIGINE, LEVETIRACETA  CT Head without contrast(Personally reviewed): CTH was  negative for a  large hypodensity concerning for a large territory infarct or hyperdensity concerning for an ICH.  ASSESSMENT   Dragan L Gio Janoski. is a 88 y.o. male with hx of CAD, diabetes, systolic CHF, heart block status post pacemaker, hyperlipidemia, hypertension, myasthenia gravis on Mestinon  and CellCept , sleep apnea, GI  A-fib on Eliquis  who presents with shortness of breath and hypoxic to 80s on room air.  Found to have CHF exacerbation and significantly improved after Lasix  administration.  He does have history of myasthenia gravis on CellCept  and Mestinon .  Reports compliance.  In the past, with his MG exacerbation, he has had difficulty chewing and tightness of his jaw.  He denies any jaw tightness or difficulty chewing today.  His voice does seem to be different than his baseline per his wife.  However, wife reports that his voice is much improved from yesterday.  On neuroexam, he has slight weakness of his neck extension and flexion at about 4 out of 5.  The origin of neck flexion extension is C2-C4 and close to phrenic nerves which is C3-C5.  Overall, with his improvement, I discussed in detail with patient and his wife and would hold off on IVIG for myasthenia gravis at this time with close monitoring of his respiratory parameters and every 6 hours this and vital capacities for now.  If no send vital capacities are downtrending or neck flexion and extension is worsening, we should consider further treatment with IVIG.  RECOMMENDATIONS  - Every 6 hours NIFs and VC for now. Can be spaced out if stable over the next 24-48 hours. - NPO until swallow eval - Recommend elective intubation for respiratory compromise if VC falls below 15 to 20 mL/kg and or NIFs falls below -20cm/H2O. If poor effort and not sure, can always get ABG to assess for CO2 retention. Elective intubation for airway cmopromise if she has difficulty clearing her secretions. Oxygen saturation should not be used to make decision  regarding intubation. - Recommend Mestinon  PO 60mg  QID. If unable to swallow, can switch from PO to IV.(30mg  PO is equivalent to 1mg  IV). - Medications that may worsen or trigger MG exacerbation: Class IA antiarrhythmics, magnesium, flouroquinolones, macrolides, aminoglycosides, penicillamine, curare, interferon alpha, botox, quinine. Use with caution: calcium  channel blocker, beta blockers and statins.  ______________________________________________________________________  Plan discussed with patient and his wife at the bedside.  Plan also discussed with Dr. Keturah with the overnight hospitalist team.  Signed, Cledith Abdou, MD [facility]

## 2024-05-05 NOTE — Hospital Course (Addendum)
 The patient is a 88 year old male with a history of CAD s/p PIC to LAD in 2003, A-fib on Eliquis , HFmrEF (LVEF 40-45%), complete heart block status post PPM, diabetes mellitus, IDA, HLD, HTN, myasthenia gravis on Mestinon  and CellCept , OSA, who presented to the emergency department on 05/04/2024 complaining of worsening dyspnea and fatigue for the past week.  Notably, the patient was in clinic receiving Prolia  injection for osteoporosis when he was noted to have increased work of breathing and significant shortness of breath, and was recommended to present to the ED.  He presented to [facility] ED where he was noted to be hypoxic to the 80s on room air.  He was started on supplemental oxygen and diuresed with IV Lasix  with significant improvement in symptoms. He was admitted for acute on chronic CHF exacerbation.

## 2024-05-05 NOTE — Assessment & Plan Note (Signed)
-  No longer on CPAP

## 2024-05-06 ENCOUNTER — Encounter (HOSPITAL_COMMUNITY): Payer: Self-pay | Admitting: Internal Medicine

## 2024-05-06 LAB — BASIC METABOLIC PANEL WITH GFR
Anion gap: 9 (ref 5–15)
BUN: 15 mg/dL (ref 8–23)
CO2: 35 mmol/L — ABNORMAL HIGH (ref 22–32)
Calcium: 8.2 mg/dL — ABNORMAL LOW (ref 8.9–10.3)
Chloride: 97 mmol/L — ABNORMAL LOW (ref 98–111)
Creatinine, Ser: 0.74 mg/dL (ref 0.61–1.24)
GFR, Estimated: >60 mL/min (ref >60)
Glucose, Bld: 100 mg/dL — ABNORMAL HIGH (ref 70–99)
Potassium: 3.4 mmol/L — ABNORMAL LOW (ref 3.5–5.1)
Sodium: 141 mmol/L (ref 135–145)

## 2024-05-06 LAB — PHOSPHORUS: Phosphorus: 3.5 mg/dL (ref 2.5–4.6)

## 2024-05-06 MED ORDER — POTASSIUM CHLORIDE ER 10 MEQ PO TBCR
40.0000 meq | EXTENDED_RELEASE_TABLET | Freq: Once | ORAL | Status: AC
  Administered 2024-05-06: 40 meq via ORAL
  Filled 2024-05-06: qty 4

## 2024-05-06 MED ORDER — FUROSEMIDE 10 MG/ML IJ SOLN
40.0000 mg | Freq: Every day | INTRAMUSCULAR | Status: DC
  Administered 2024-05-07: 40 mg via INTRAVENOUS
  Filled 2024-05-06: qty 4

## 2024-05-06 NOTE — Progress Notes (Signed)
 Attempted to do a VC and NIF; however, Pt. Was too tired and non-compliant. Pt. Wife was trying to coach and encourage David [name]. I (Respiratory therapist)  said ill come back around 04:00 and try again.

## 2024-05-06 NOTE — Progress Notes (Signed)
 Pt. Attempted FVC 5 times, without consistency. -Best FVC was 1.32. -FEV1 was 2.10 -FEV1/FVC was 0.79 -Pt attempted NIF. Pt. Best results are -27.

## 2024-05-06 NOTE — Progress Notes (Signed)
 Patient performed a NIF and VC with low effort as he was still sleepy.   NIF -23 VC 1.2L

## 2024-05-06 NOTE — Progress Notes (Signed)
 Heart Failure Nurse Navigator Progress Note  PCP: Clarice Nottingham, MD PCP-Cardiologist: Pietro Admission Diagnosis: Acute on chronic congestive heart failure.  Admitted from: Home  Presentation:   David [name]. presented with cough,hypoxic sats in the 80s, shortness of breath x 1 week, fatigue, dizziness, some BLE edema. . Got a bone infusion shot this morning and recommended to come to ED d/t SHOB and WOB. BP 146/80, HR 55, Pro BNP 3,066, Troponin 20, CXR with cardiomegaly with increased densities in both lungs, small right pleural effusion. Per his wife Recently returned from a cruise ship to [place]  where he reports consuming large amounts of seafood, after which he began to notice an increase in his weight, lower extremity swelling, and dyspnea on exertion .  Patient and his wife were both educated on the sign and symptoms of heart failure, daily weights, when to call his doctor or go to the ED, Diet/ fluid restrictions, taking all medications as prescribed and attending all medical appointments. After questions were answered both patient and his wife verbalized their understanding of the education. A HF TOC appointment was scheduled for 05/18/2024 @ 10:30 am.   ECHO/ LVEF: 55-60%  Clinical Course:  Past Medical History:  Diagnosis Date   Aortic insufficiency    Ascending aorta dilation    Balance problem    CAD (coronary artery disease)    previous stent to LAD 2003   Diabetes mellitus without complication (HCC)    No longer taking diabetic medications at this time   GI bleeding    HFrEF (heart failure with reduced ejection fraction) (HCC)    History of complete heart block    History of kidney stones    Hyperlipidemia    Hypertension    Iron deficiency anemia    LBBB (left bundle branch block)    Melanoma (HCC)    Myasthenia gravis (HCC)    eyelids, finger, and toes   NSVT (nonsustained ventricular tachycardia) (HCC)    OSA (obstructive sleep apnea)    Does not  wear CPAP.   Presence of permanent cardiac pacemaker    S/P cardiac pacemaker procedure, placement 10/24/14 Medtronic Adapta L model ADDRL 1  10/25/2014   Spinal stenosis    Severe   Tricuspid regurgitation      Social History   Socioeconomic History   Marital status: Married    Spouse name: [name]   Number of children: 4   Years of education: Not on file   Highest education level: High school graduate  Occupational History   Occupation: Retired  Tobacco Use   Smoking status: Former    Current packs/day: 0.00    Types: Cigarettes, Cigars    Start date: 04/28/1949    Quit date: 10/27/1982    Years since quitting: 41.5   Smokeless tobacco: Never  Vaping Use   Vaping status: Never Used  Substance and Sexual Activity   Alcohol  use: Not Currently   Drug use: No   Sexual activity: Not on file  Other Topics Concern   Not on file  Social History Narrative   Not on file   Social Drivers of Health   Financial Resource Strain: Low Risk  (05/06/2024)   Overall Financial Resource Strain (CARDIA)    Difficulty of Paying Living Expenses: Not hard at all  Food Insecurity: Not on file  Transportation Needs: No Transportation Needs (05/06/2024)   PRAPARE - Administrator, Civil Service (Medical): No    Lack of Transportation (Non-Medical): No  Physical Activity: Not on file  Stress: Not on file  Social Connections: Not on file   Education Assessment and Provision:  Detailed education and instructions provided on heart failure disease management including the following:  Signs and symptoms of Heart Failure When to call the physician Importance of daily weights Low sodium diet Fluid restriction Medication management Anticipated future follow-up appointments  Patient education given on each of the above topics.  Patient acknowledges understanding via teach back method and acceptance of all instructions.  Education Materials:  Living Better With Heart Failure  Booklet, HF zone tool, & Daily Weight Tracker Tool.  Patient has scale at home: Yes Patient has pill box at home: Yes    High Risk Criteria for Readmission and/or Poor Patient Outcomes: Heart failure hospital admissions (last 6 months): 1  No Show rate: 12 % Difficult social situation: No, lives with his wife Demonstrates medication adherence: lives with wife Primary Language: English Literacy level: Reading, writing, and comprehension.   Barriers of Care:   Diet/ fluid compliance  Considerations/Referrals:   Referral made to Heart Failure Pharmacist Stewardship: NA Referral made to Heart Failure CSW/NCM TOC: NA Referral made to Heart & Vascular TOC clinic: Yes, 05/18/2024 @ 10:30 am.   Items for Follow-up on DC/TOC: Continued HF education Diet/ fluid restrictions   Stephane Haddock, BSN, RN Heart Failure Teacher, adult education Only

## 2024-05-06 NOTE — Plan of Care (Signed)
  Problem: Education: Goal: Knowledge of General Education information will improve Description: Including pain rating scale, medication(s)/side effects and non-pharmacologic comfort measures Outcome: Progressing   Problem: Safety: Goal: Ability to remain free from injury will improve Outcome: Progressing   Problem: Fluid Volume: Goal: Ability to maintain a balanced intake and output will improve Outcome: Progressing

## 2024-05-06 NOTE — Progress Notes (Signed)
 Patient NIF and VC performed with great effort.  NIF -26 VC 1.37L

## 2024-05-06 NOTE — Progress Notes (Signed)
 Mobility Specialist Progress Note;   05/06/24 1404  Mobility  Activity Ambulated with assistance  Level of Assistance Contact guard assist, steadying assist  Assistive Device Front wheel walker  Distance Ambulated (ft) 400 ft  Activity Response Tolerated well  Mobility Referral Yes  Mobility visit 1 Mobility  Mobility Specialist Start Time (ACUTE ONLY) 1404  Mobility Specialist Stop Time (ACUTE ONLY) 1435  Mobility Specialist Time Calculation (min) (ACUTE ONLY) 31 min   Pt agreeable to mobility w/ encouragement. On RA upon arrival, pt had taken [place] out of nose (SPO2 88%). Required MinG assistance to safely ambulate in hallway. Ambulated on 2LO2 to maintain SPO2. Was able to void in BR. Pt returned to bed and left with all needs met, alarm on. Wife present.   Lauraine Erm Mobility Specialist Please contact via SecureChat or [employer] [phone]

## 2024-05-06 NOTE — Progress Notes (Signed)
 Progress Note   Patient: David [name]. FMW:[id number] DOB: 11/08/1935 DOA: 05/04/2024     2 DOS: the patient was seen and examined on 05/06/2024   Brief hospital course: The patient is a 88 year old male with a history of CAD s/p PIC to LAD in 2003, A-fib on Eliquis , HFmrEF (LVEF 40-45%), complete heart block status post PPM, diabetes mellitus, IDA, HLD, HTN, myasthenia gravis on Mestinon  and CellCept , OSA, who presented to the emergency department on 05/04/2024 complaining of worsening dyspnea and fatigue for the past week.  Notably, the patient was in clinic receiving Prolia  injection for osteoporosis when he was noted to have increased work of breathing and significant shortness of breath, and was recommended to present to the ED.  He presented to [facility] where he was noted to be hypoxic to the 80s on room air.  He was started on supplemental oxygen and diuresed with IV Lasix  with significant improvement in symptoms. He was admitted for acute on chronic CHF exacerbation.  Assessment and Plan:  #Acute hypoxic respiratory failure secondary to Acute CHF exacerbation -- resolved #Acute on chronic CHF exacerbation - Patient presented with 1 week of worsening DOE and fatigue, noted to have pulmonary rales and bilateral pedal edema.  - Per discussion with the patient and his wife, the appears appears to not be compliant with or even aware any fluid or sodium restrictions that he should be adhering to.  Additionally, although they are meticulous and recording daily BP, HR, and weights, they were unaware of how to utilize his prescription for PRN Lasix . Recently returned from a cruise ship to [place]  where he reports consuming large amounts of seafood, after which he began to notice an increase in his weight, lower extremity swelling, and dyspnea on exertion. - Reports normal sleeping on his side using only 1 pillow - Was hypoxic down to 85 on RA in the ED, necessitating supplemental  oxygen with with titration up to 3 L nasal cannula to maintain appropriate SpO2 - CXR findings of cardiomegaly with increased densities in both lung fields concerning for pulmonary edema. - proBNP elevated to 3066 - Responded well to dose of IV Lasix  40 mg given in the ED - Echo (05/05/2024) shows improvement in LVEF to 55-60% (from prior 40 to 45% in 08/2022), no regional wall motion abnormalities, mild concentric left ventricular hypertrophy, mildly reduced RV systolic function, and mild aortic regurgitation. - Per chart review, patient's GDMT previously limited by symptomatic hypotension.  Was unable to tolerate spironolactone  or Entresto  even at lower dose, and was ultimately titrated down to low dose Losartan . His current GDMT regimen includes Coreg  6.25 mg twice daily, Jardiance  10 mg, and losartan  12.5 mg. These were held on admission. Will hold off on resuming now due to relatively lower blood pressures in the 90-110s today. - Continue diuresis with IV Lasix  40 mg BID - Currently on RA maintaining SpO2 > 90% - Strict I's and O's -- net -5.2 L since admission - Daily weights -- weight on admission (10/8) 200 lbs > 189 lbs (10/10) - Patient's wife will need to be provided with clear written instructions on discharge for when to administer Lasix  and which of his GDMT to continue versus hold if not all are resumed on discharge - Measure proBNP and dry weight on discharge  # ? Hypotension - Patient had multiple low blood pressures low charted this morning - 85/49 (60) and 100/51 (64) on repeat.  Discussed with RN who later found out  that blood pressures were being measured on the patient's wrist.  Upon rechecking on the patient's upper arm, his blood pressure was noted to be 121/70 and 124/65 on repeat - However, will continue to hold home antihypertensive medications  #Contraction alkalosis - Labs today showing metabolic alkalosis with hypochloremia, consistent with contraction alkalosis in the  setting of ongoing diuresis - Will dial back diuresis to IV Lasix  40 mg once daily  #Hypokalemia - K 3.4 - Repleted  #Myasthenia gravis - Continue Mestinon  60 mg QID and CellCept  - Neurology consulted -- recommended every 6 hour NIF's and VC -- monitor for VC less than 15 to 20 mL/kg and/or NIF less than - 20 cm/H2O.   #BPH with LUTS - Continue Flomax  0.4 mg daily and desmopressin  nightly  Subjective: Patient seen at bedside.  No acute events overnight per patient or nursing staff.  Patient's wife, [name], was at bedside.  Patient reports feeling very well today with significant improvement in his shortness of breath and lower extremity swelling, reports being ready to go home.  Denies chest pain, shortness of breath, abdominal pain, nausea, vomiting, fevers, chills.  He is currently on room air maintaining SpO2 greater than 92% during my conversation with him.  Physical Exam: Vitals:   05/06/24 0339 05/06/24 0500 05/06/24 0746 05/06/24 0747  BP:   (!) 161/145 (!) 85/49  Pulse:   62 (!) 59  Resp: (!) (P) 22  20 20   Temp: (P) 98.4 F (36.9 C)  98.1 F (36.7 C) 98.1 F (36.7 C)  TempSrc:   Oral Oral  SpO2:   90% 90%  Weight:  85.9 kg    Height:       Physical Exam Constitutional:      General: He is not in acute distress.    Appearance: He is not ill-appearing.  HENT:     Mouth/Throat:     Mouth: Mucous membranes are moist.  Cardiovascular:     Rate and Rhythm: Normal rate and regular rhythm.     Heart sounds: Normal heart sounds. No murmur heard. Pulmonary:     Effort: Pulmonary effort is normal. No respiratory distress.     Breath sounds: Rales (Mild bibasilar rales) present. No wheezing.  Abdominal:     General: Bowel sounds are normal. There is no distension.     Palpations: Abdomen is soft.     Tenderness: There is no abdominal tenderness. There is no guarding.  Musculoskeletal:     Right lower leg: Edema (Trace) present.     Left lower leg: Edema (Trace)  present.  Skin:    General: Skin is warm and dry.     Capillary Refill: Capillary refill takes less than 2 seconds.  Neurological:     Mental Status: He is alert and oriented to person, place, and time. Mental status is at baseline.    Family Communication: Discussed with the patient, his wife Ms. [name].  Reiterated importance of compliance with sodium and fluid restrictions, monitoring daily weights, and when to take as needed Lasix .  Disposition: Status is: Inpatient Remains inpatient appropriate because: CHF exacerbation requiring continued diuresis Planned Discharge Destination: Home   DVT Prophylaxis: apixaban  (ELIQUIS ) tablet 5 mg    Time spent: 50 minutes  Author: Lorenda Grecco Al-Sultani, MD 05/06/2024 9:55 AM  For on call review [email or web address].

## 2024-05-07 LAB — RENAL FUNCTION PANEL
Albumin: 3.1 g/dL — ABNORMAL LOW (ref 3.5–5.0)
Anion gap: 10 (ref 5–15)
BUN: 14 mg/dL (ref 8–23)
CO2: 33 mmol/L — ABNORMAL HIGH (ref 22–32)
Calcium: 8.5 mg/dL — ABNORMAL LOW (ref 8.9–10.3)
Chloride: 99 mmol/L (ref 98–111)
Creatinine, Ser: 0.65 mg/dL (ref 0.61–1.24)
GFR, Estimated: >60 mL/min (ref >60)
Glucose, Bld: 91 mg/dL (ref 70–99)
Phosphorus: 2.6 mg/dL (ref 2.5–4.6)
Potassium: 3.8 mmol/L (ref 3.5–5.1)
Sodium: 142 mmol/L (ref 135–145)

## 2024-05-07 LAB — BRAIN NATRIURETIC PEPTIDE: B Natriuretic Peptide: 165.1 pg/mL — ABNORMAL HIGH (ref 0.0–100.0)

## 2024-05-07 MED ORDER — FUROSEMIDE 20 MG PO TABS: 20.0000 mg | ORAL_TABLET | Freq: Every day | ORAL | 1 refills | Status: AC

## 2024-05-07 NOTE — Discharge Summary (Signed)
 Physician Discharge Summary   Patient: David [name]. MRN: [id number] DOB: 1936/06/24  Admit date:     05/04/2024  Discharge date: 05/07/24  Discharge Physician: Duffy Al-Sultani   PCP: Clarice Nottingham, MD   Recommendations at discharge:  {Tip this will not be part of the note when signed- Example include specific recommendations for outpatient follow-up, pending tests to follow-up on. (Optional):26781}  ***  Discharge Diagnoses: Principal Problem:   Acute exacerbation of CHF (congestive heart failure) (HCC) Active Problems:   CHB (complete heart block), symptomatic   CAD in native artery   OSA (obstructive sleep apnea)   Myasthenia gravis (HCC)   BPH with obstruction/lower urinary tract symptoms   Type 2 diabetes mellitus with unspecified complications (HCC)   Acute HFrEF (heart failure with reduced ejection fraction) (HCC)   Acute urinary retention  Resolved Problems:   * No resolved hospital problems. [facility] Course: The patient is a 88 year old male with a history of CAD s/p PIC to LAD in 2003, A-fib on Eliquis , HFmrEF (LVEF 40-45%), complete heart block status post PPM, diabetes mellitus, IDA, HLD, HTN, myasthenia gravis on Mestinon  and CellCept , OSA, who presented to the emergency department on 05/04/2024 complaining of worsening dyspnea and fatigue for the past week.  Notably, the patient was in clinic receiving Prolia  injection for osteoporosis when he was noted to have increased work of breathing and significant shortness of breath, and was recommended to present to the ED.  He presented to [facility] ED where he was noted to be hypoxic to the 80s on room air.  He was started on supplemental oxygen and diuresed with IV Lasix  with significant improvement in symptoms. He was admitted for acute on chronic CHF exacerbation.  Assessment and Plan: No notes have been filed under this hospital service. Service: Hospitalist     {Tip this will not be part of  the note when signed Body mass index is 27.97 kg/m. , ,  (Optional):26781}  {(NOTE) Pain control PDMP Statment (Optional):26782} Consultants: *** Procedures performed: ***  Disposition: {Plan; Disposition:26390} Diet recommendation:  {Diet_Plan:26776} DISCHARGE MEDICATION: Allergies as of 05/07/2024       Reactions   Azithromycin    Runny nose    Clindamycin Hcl    Runny nose    Doxycycline Diarrhea   Doxycycline Hyclate    unknown   Levofloxacin    unknown   Magnesium    Myasthenia gravis (Mg can precipitate MG crisis)   Niacin    unknown   Gris-peg [griseofulvin] Other (See Comments)   headaches   Terbinafine Other (See Comments)   headache        Medication List     PAUSE taking these medications    carvedilol  6.25 MG tablet Wait to take this until your doctor or other care provider tells you to start again. This medication was held because your blood pressure was low while you were in the hospital.  Keep a log of your blood pressures at home and take that to your next PCP or cardiology visit for them to assess resumption of this medication.  Commonly known as: COREG  TAKE 1 TABLET(6.25 MG) BY MOUTH TWICE DAILY       TAKE these medications    apixaban  5 MG Tabs tablet Commonly known as: ELIQUIS  Take 1 tablet (5 mg total) by mouth 2 (two) times daily.   desmopressin  0.1 MG tablet Commonly known as: DDAVP  TAKE 1 TABLET(0.1 MG TABLET) BY MOUTH AT BEDTIME. CALL [phone]  TO SCHEDULE FOLLOW UP FOR ONGOING REFILLS.   ezetimibe -simvastatin  10-40 MG tablet Commonly known as: VYTORIN  Take 1 tablet by mouth daily.   famotidine  20 MG tablet Commonly known as: Pepcid  One after supper What changed:  how much to take how to take this when to take this additional instructions   furosemide  20 MG tablet Commonly known as: Lasix  Take 1 tablet (20 mg total) by mouth daily. Take an extra 20 mg for weight gain of 3 lbs in 1 day, 5 lbs in one week, worsening  ankle swelling, or worsening shortness of breath. Let your provider know if you do so. What changed:  when to take this reasons to take this additional instructions   ipratropium 0.06 % nasal spray Commonly known as: ATROVENT  Up to 2 puffs every 6 hours as needed   Jardiance  10 MG Tabs tablet Generic drug: empagliflozin  TAKE 1 TABLET BY MOUTH EVERY DAY   loratadine  10 MG tablet Commonly known as: CLARITIN  Take 10 mg by mouth daily.   losartan  25 MG tablet Commonly known as: COZAAR  Take 0.5 tablets (12.5 mg total) by mouth daily.   mycophenolate  250 MG capsule Commonly known as: CELLCEPT  Take 1 capsule (250 mg total) by mouth 2 (two) times daily.   nitroGLYCERIN  0.4 MG SL tablet Commonly known as: NITROSTAT  Place 1 tablet (0.4 mg total) under the tongue every 5 (five) minutes as needed for chest pain.   pantoprazole  40 MG tablet Commonly known as: Protonix  Take 1 tablet (40 mg total) by mouth daily. Take 30-60 min before first meal of the day   pyridostigmine  60 MG tablet Commonly known as: MESTINON  Take 1 tablet (60 mg total) by mouth 5 (five) times daily. What changed: when to take this   Systane 0.4-0.3 % Soln Generic drug: Polyethyl Glycol-Propyl Glycol Place 1 drop into both eyes daily as needed (dry eyes).   tamsulosin  0.4 MG Caps capsule Commonly known as: FLOMAX  Take 1 capsule (0.4 mg total) by mouth daily.   vitamin D3 50 MCG (2000 UT) Caps Take 1 capsule by mouth daily.               Durable Medical Equipment  (From admission, onward)           Start     Ordered   05/05/24 1616  For home use only DME Walker rolling  Once       Question Answer Comment  Walker: With 5 Inch Wheels   Patient needs a walker to treat with the following condition Weakness      05/05/24 1616            Follow-up Information     Clarice Nottingham, MD. Schedule an appointment as soon as possible for a visit in 1 week(s).   Specialty: Internal  Medicine Contact information: [address] [place] [address] [phone]         [facility].   Specialty: Specialist Contact information: [address] [place] [address] [phone]         [facility]. Go in 11 day(s).   Specialty: Cardiology Why: Hospital follow up 05/18/2024 @ 10:30 am PLEASE bring a current medication list to appointment FREE valet parking, Entrance C, poff [employer] [facility] and Neurosurgeon information: [employer] [place]  [phone]               Discharge  Exam: Filed Weights   05/04/24 1156 05/05/24 0425 05/06/24 0500  Weight: 90.7 kg 87.1 kg 85.9 kg   ***  Condition at discharge: {DC Condition:26389}  The results of significant diagnostics from this hospitalization (including imaging, microbiology, ancillary and laboratory) are listed below for reference.   Imaging Studies: ECHOCARDIOGRAM COMPLETE Result Date: 05/05/2024    ECHOCARDIOGRAM REPORT   Patient Name:   Omarii Scalzo. Date of Exam: 05/05/2024 Medical Rec #:  [id number]             Height:       69.0 in Accession #:    [id number]            Weight:       192.0 lb Date of Birth:  [birth year]             BSA:          2.030 m Patient Age:    88 years              BP:           128/70 mmHg Patient Gender: M                     HR:           72 bpm. Exam Location:  Inpatient Procedure: 2D Echo, Cardiac Doppler, Color Doppler and Intracardiac            Opacification Agent (Both Spectral and Color Flow Doppler were            utilized during procedure). Indications:    CHF  History:        Patient has prior history of Echocardiogram examinations, most                 recent 09/03/2022. CHF, CAD; Risk Factors:Sleep Apnea,                 Hypertension, Dyslipidemia and Diabetes.  Sonographer:    Philomena Daring Referring Phys: [id number]  ANASTASSIA DOUTOVA IMPRESSIONS  1. Left ventricular ejection fraction, by estimation, is 55 to 60%. The left ventricle has normal function. The left ventricle has no regional wall motion abnormalities. There is mild concentric left ventricular hypertrophy. Left ventricular diastolic parameters are indeterminate.  2. Peak RV-RA gradient 28 mmHg. Right ventricular systolic function is mildly reduced. The right ventricular size is mildly enlarged.  3. Left atrial size was mildly dilated.  4. Right atrial size was mildly dilated.  5. The mitral valve is normal in structure. No evidence of mitral valve regurgitation. No evidence of mitral stenosis.  6. The aortic valve is tricuspid. There is mild calcification of the aortic valve. Aortic valve regurgitation is mild. No aortic stenosis is present.  7. Aortic dilatation noted. There is moderate dilatation of the aortic root, measuring 45 mm. There is moderate dilatation of the ascending aorta, measuring 46 mm.  8. IVC not visualized. FINDINGS  Left Ventricle: Left ventricular ejection fraction, by estimation, is 55 to 60%. The left ventricle has normal function. The left ventricle has no regional wall motion abnormalities. Definity contrast agent was given IV to delineate the left ventricular  endocardial borders. The left ventricular internal cavity size was normal in size. There is mild concentric left ventricular hypertrophy. Left ventricular diastolic parameters are indeterminate. Right Ventricle: Peak RV-RA gradient 28 mmHg. The right ventricular size is mildly enlarged. No increase in right ventricular wall thickness. Right ventricular systolic function is mildly reduced.  Left Atrium: Left atrial size was mildly dilated. Right Atrium: Right atrial size was mildly dilated. Pericardium: There is no evidence of pericardial effusion. Mitral Valve: The mitral valve is normal in structure. No evidence of mitral valve regurgitation. No evidence of mitral valve stenosis.  Tricuspid Valve: The tricuspid valve is normal in structure. Tricuspid valve regurgitation is mild. Aortic Valve: The aortic valve is tricuspid. There is mild calcification of the aortic valve. Aortic valve regurgitation is mild. No aortic stenosis is present. Pulmonic Valve: The pulmonic valve was normal in structure. Pulmonic valve regurgitation is not visualized. Aorta: Aortic dilatation noted. There is moderate dilatation of the aortic root, measuring 45 mm. There is moderate dilatation of the ascending aorta, measuring 46 mm. Venous: IVC not visualized. IAS/Shunts: No atrial level shunt detected by color flow Doppler. Additional Comments: A device lead is visualized.  LEFT VENTRICLE PLAX 2D LVIDd:         4.77 cm   Diastology LVIDs:         3.41 cm   LV e' medial:    6.64 cm/s LV PW:         0.86 cm   LV E/e' medial:  14.3 LV IVS:        1.39 cm   LV e' lateral:   8.81 cm/s LVOT diam:     2.57 cm   LV E/e' lateral: 10.8 LV SV:         102 LV SV Index:   50 LVOT Area:     5.19 cm  RIGHT VENTRICLE RV S prime:     8.16 cm/s TAPSE (M-mode): 1.6 cm LEFT ATRIUM             Index        RIGHT ATRIUM           Index LA diam:        4.41 cm 2.17 cm/m   RA Area:     24.20 cm LA Vol (A2C):   70.3 ml 34.62 ml/m  RA Volume:   62.10 ml  30.58 ml/m LA Vol (A4C):   56.2 ml 27.68 ml/m LA Biplane Vol: 66.7 ml 32.85 ml/m  AORTIC VALVE LVOT Vmax:   91.90 cm/s LVOT Vmean:  59.600 cm/s LVOT VTI:    0.197 m  AORTA Ao Root diam: 4.45 cm Ao Asc diam:  4.58 cm MITRAL VALVE               TRICUSPID VALVE MV Area (PHT): 4.01 cm    TR Peak grad:   28.3 mmHg MV Decel Time: 189 msec    TR Vmax:        266.00 cm/s MV E velocity: 95.20 cm/s                            SHUNTS                            Systemic VTI:  0.20 m                            Systemic Diam: 2.57 cm Dalton McleanMD Electronically signed by Ezra Kanner Signature Date/Time: 05/05/2024/1:20:45 PM    Final    CT HEAD WO CONTRAST ( ) Result Date:  05/04/2024 EXAM: CT HEAD WITHOUT CONTRAST 05/04/2024 09:54:53 PM TECHNIQUE: CT of the head was performed without the  administration of intravenous contrast. Automated exposure control, iterative reconstruction, and/or weight based adjustment of the mA/kV was utilized to reduce the radiation dose to as low as reasonably achievable. COMPARISON: CT head 04/21/2016 CLINICAL HISTORY: Motor neuron disease suspected. FINDINGS: BRAIN AND VENTRICLES: No acute hemorrhage. No evidence of acute infarct. No hydrocephalus. No extra-axial collection. No mass effect or midline shift. ORBITS: No acute abnormality. SINUSES: No acute abnormality. SOFT TISSUES AND SKULL: No skull fracture. IMPRESSION: 1. No acute intracranial abnormality. Electronically signed by: Gilmore Molt MD 05/04/2024 11:56 PM EDT RP Workstation: [id number]   DG Chest 2 View Result Date: 05/04/2024 CLINICAL DATA:  Shortness of breath.  Fatigue and dizziness. EXAM: CHEST - 2 VIEW COMPARISON:  03/09/2024 FINDINGS: Decreased lung volumes. Increased densities in both lungs are concerning for pulmonary edema. Cardiomegaly with a left chest dual lead pacemaker. Negative for a pneumothorax. Blunting at the right costophrenic angle and cannot exclude a small right pleural effusion. IMPRESSION: 1. Cardiomegaly with increased densities in both lungs. Findings are concerning for pulmonary edema. 2. Possible small right pleural effusion. Electronically Signed   By: Juliene Balder M.D.   On: 05/04/2024 13:20   DG SWALLOW FUNC OP MEDICARE SPEECH PATH Result Date: 04/20/2024 Table formatting from the original result was not included. Modified Barium Swallow Study Patient Details Name: Ignacio Lowder. MRN: [id number] Date of Birth: 1936/06/11 Today's Date: 04/20/2024 HPI/PMH: HPI: 88 yo male referred for MBS by Dr Darlean. Pt PMH + Ocular Myasthenia Gravis x20-30 years, HLD, HTN, ataxia, spinal stenosis, allergic rhinitis, ataxia and weakness with walking and left more than  right leg edema.  Pt also with recent issues with chronic throat clearing and coughing, which he attributes to secretions.  He reports occasional choking with foods - not liquids.  Of note, he underwent imaging of cervical spine with concerns for possible right vallecula mass - s/p scoping by ENT *Dr Karis* in 2018? which was negative.  Dr Darlean, pulmonology, started pt on regimen of PPI 30 minutes before breakfast and an H2blocker after supper.  He also advised pt avoid any mint.  Also recommend avoid throat clearing using intentional swallow instead.  Pt also with h/o left facial inflammatory changes prompting ED visit - imaging of neck concerning for parotitis with sialoadenitis of left submandibular and parotid glands, presumed to be viral. Pt reports largest complaint re: swallowing is his congestion  stating If I could get rid of that, I would be fine.  He denies any issues with reflux, recurrent pneumonias, requiring heimlich manuever nor having weight loss.  He reports weight gain after having traveled on cruises. Clinical Impression: Clinical Impression: Patient presents with overall functional oropharyngeal swallow ability. Variance in swallowing included pt triggering swallow at vallecular space with cracker and pyriform with puree and liquids.  Pt also talking prior to initiating swallow with solids filling vallecular space - He did elicit a swallow per cue.  Trace laryngeal penetration of ultrathin *1/2 thin barium, 1/2 water* to cords with large sequential swallows. Cued cough effectively cleared penetration. Of note, pt did not cough or clear his throat during the MBS.  However prior to MBS, he demonstrated chronic throat clearing both during interview and after water intake.   He did report sensation of regurgitation of the 3 ounces of water provided during [facility] screen.  Recommend continue diet with general precautions. Factors that may increase risk of adverse event in presence of aspiration  [name] 2021): Factors that may increase risk of  adverse event in presence of aspiration [name] 2021): Respiratory or GI disease; Limited mobility; Frail or deconditioned Recommendations/Plan: Swallowing Evaluation Recommendations Swallowing Evaluation Recommendations Recommendations: PO diet PO Diet Recommendation: Regular; Thin liquids (Level 0) Liquid Administration via: Cup; Straw Medication Administration: Whole meds with liquid Supervision: Patient able to self-feed Swallowing strategies  : Slow rate; Small bites/sips Postural changes: Stay upright 30-60 min after meals; Out of bed for meals Oral care recommendations: Oral care BID (2x/day) Treatment Plan Treatment Plan Treatment recommendations: No treatment recommended at this time Recommendations Recommendations for follow up therapy are one component of a multi-disciplinary discharge planning process, led by the attending physician.  Recommendations may be updated based on patient status, additional functional criteria and insurance authorization. Assessment: Orofacial Exam: Orofacial Exam Oral Cavity: Oral Hygiene: WFL Oral Cavity - Dentition: Adequate natural dentition Orofacial Anatomy: WFL Oral Motor/Sensory Function: Other (comment) (suspect subtle left facial asymmetry, pt with slight head tilt to the right) Anatomy: Anatomy: WFL Boluses Administered: Boluses Administered Boluses Administered: Thin liquids (Level 0); Mildly thick liquids (Level 2, nectar thick); Moderately thick liquids (Level 3, honey thick); Puree; Solid  Oral Impairment Domain: Oral Impairment Domain Lip Closure: No labial escape Tongue control during bolus hold: Posterior escape of less than half of bolus (with thin liquid via straw x1) Bolus preparation/mastication: Slow prolonged chewing/mashing with complete recollection (pt reports he masticates well at baseline) Bolus transport/lingual motion: Brisk tongue motion Oral residue: Trace residue lining oral  structures Location of oral residue : Tongue Initiation of pharyngeal swallow : Posterior laryngeal surface of the epiglottis; Valleculae  Pharyngeal Impairment Domain: Pharyngeal Impairment Domain Soft palate elevation: No bolus between soft palate (SP)/pharyngeal wall (PW) Laryngeal elevation: Complete superior movement of thyroid  cartilage with complete approximation of arytenoids to epiglottic petiole Anterior hyoid excursion: Complete anterior movement Epiglottic movement: Partial inversion Laryngeal vestibule closure: Incomplete, narrow column air/contrast in laryngeal vestibule Pharyngeal stripping wave : Present - diminished Pharyngeal contraction (A/P view only): -- (DNT due to pt being in incompatible wheelchair for A-P view) Tongue base retraction: Narrow column of contrast or air between tongue base and PPW Pharyngeal residue: Trace residue within or on pharyngeal structures Location of pharyngeal residue: Valleculae (vallecula retention with liquids spilled to pyriform sinus; vallecula retention with solids/puree)  Esophageal Impairment Domain: Esophageal Impairment Domain Esophageal clearance upright position: Complete clearance, esophageal coating Pill: Pill Consistency administered: Thin liquids (Level 0) Thin liquids (Level 0): [facility] Penetration/Aspiration Scale Score: Penetration/Aspiration Scale Score 1.  Material does not enter airway: Mildly thick liquids (Level 2, nectar thick); Moderately thick liquids (Level 3, honey thick); Puree; Solid; Pill 5.  Material enters airway, CONTACTS cords and not ejected out: Thin liquids (Level 0); Pill Compensatory Strategies: Compensatory Strategies Compensatory strategies: No   General Information: No data recorded Diet Prior to this Study: Regular; Thin liquids (Level 0)   No data recorded  Respiratory Status: WFL   Supplemental O2: None (Room air)   History of Recent Intubation: No  Behavior/Cognition: Alert; Cooperative; Pleasant mood Self-Feeding  Abilities: Able to self-feed Baseline vocal quality/speech: Dysphonic (appears slightly hoarse, ? due to chronicity of throat clearing) Volitional Cough: Unable to elicit Volitional Swallow: Able to elicit Exam Limitations: No limitations Goal Planning: No data recorded No data recorded No data recorded No data recorded Consulted and agree with results and recommendations: Patient Pain: No data recorded End of Session: Start Time:SLP Start Time (ACUTE ONLY): 1310 Stop Time: SLP Stop Time (ACUTE ONLY): 1345 Time Calculation:SLP Time Calculation (  min) (ACUTE ONLY): 35 min Charges: SLP Evaluations $ SLP Speech Visit: 1 Visit SLP Evaluations $Outpatient MBS Swallow: 1 Procedure SLP visit diagnosis: SLP Visit Diagnosis: Dysphagia, unspecified (R13.10) Past Medical History: Past Medical History: Diagnosis Date  Aortic insufficiency   Ascending aorta dilation   Balance problem   CAD (coronary artery disease)   previous stent to LAD 2003  Diabetes mellitus without complication (HCC)   No longer taking diabetic medications at this time  GI bleeding   HFrEF (heart failure with reduced ejection fraction) (HCC)   History of complete heart block   History of kidney stones   Hyperlipidemia   Hypertension   Iron deficiency anemia   LBBB (left bundle branch block)   Melanoma (HCC)   Myasthenia gravis (HCC)   eyelids, finger, and toes  NSVT (nonsustained ventricular tachycardia) (HCC)   OSA (obstructive sleep apnea)   Does not wear CPAP.  Presence of permanent cardiac pacemaker   S/P cardiac pacemaker procedure, placement 10/24/14 Medtronic Adapta L model ADDRL 1  10/25/2014  Spinal stenosis   Severe  Tricuspid regurgitation  Past Surgical History: Past Surgical History: Procedure Laterality Date  COLONOSCOPY    CORONARY ANGIOPLASTY WITH STENT PLACEMENT  03/01/2002  stenting to LAD - Dr. Bulah  CYSTOSCOPY/URETEROSCOPY/HOLMIUM LASER/STENT PLACEMENT Left 08/31/2019  Procedure: CYSTOSCOPY/RETROGRADE/STENT PLACEMENT;  Surgeon: Devere Lonni Righter, MD;  Location: [facility];  Service: Urology;  Laterality: Left;  ONY NEEDS 45 MIN  NM MYOCAR PERF WALL MOTION  11/27/2010  normal  PERMANENT PACEMAKER INSERTION N/A 10/24/2014  Procedure: PERMANENT PACEMAKER INSERTION;  Surgeon: Lynwood Rakers, MD;  [facility];  Service: Cardiovascular;  Laterality: N/A;  TONSILLECTOMY    TRANSURETHRAL RESECTION OF PROSTATE N/A 07/06/2019  Procedure: TRANSURETHRAL RESECTION OF THE PROSTATE (TURP), BIPOLAR;  Surgeon: Devere Lonni Righter, MD;  Location: [facility];  Service: Urology;  Laterality: N/A;  US  ECHOCARDIOGRAPHY  12/05/2010  proximal septal thickening,mild concentric LVH,mild deptal hypokinesis,RV mildly dilated,LA mildly dilated,mild AI,moderate aortic root dilatation,septal motion c/w conduction abnormality Madelin POUR, MS [facility] SLP Acute Rehab Services Office [phone] Nicolas Emmie Caldron 04/20/2024, 7:16 PM CLINICAL DATA:  History of myasthenia gravis. Having occasional dysphagia. Reported crackles in lower lung fields, concern for possible aspiration. Consult for modified barium swallow study with speech pathology. EXAM: MODIFIED BARIUM SWALLOW TECHNIQUE: Different consistencies of barium were administered orally to the patient by the Speech Pathologist. Imaging of the pharynx was performed in the lateral projection. Kimble Clas, PA-C was present in the fluoroscopy room during this study, which was supervised and interpreted by Dr. Medford Necessary. FLUOROSCOPY: Radiation Exposure Index (as provided by the fluoroscopic device): 14.6 mGy Kerma COMPARISON:  None Available. FINDINGS: Vestibular Penetration: Intermittent flash penetration with thin barium. Aspiration:  None seen. Other: Early spill to the vallecula and piriform sinus is present with all consistencies. IMPRESSION: Intermittent flash penetration with thin barium.  No aspiration. Early spill to the vallecula and piriform sinuses with all consistencies  Please refer to the Speech Pathologists report for complete details and recommendations. Electronically Signed   By: Lonni Necessary M.D.   On: 04/20/2024 16:57   Microbiology: Results for orders placed or performed during the hospital encounter of 05/04/24  Resp panel by RT-PCR (RSV, Flu A&B, Covid) Anterior Nasal Swab     Status: None   Collection Time: 05/04/24 12:30 PM   Specimen: Anterior Nasal Swab  Result Value Ref Range Status   SARS Coronavirus 2 by RT PCR NEGATIVE NEGATIVE Final  Comment: (NOTE) SARS-CoV-2 target nucleic acids are NOT DETECTED.  The SARS-CoV-2 RNA is generally detectable in upper respiratory specimens during the acute phase of infection. The lowest concentration of SARS-CoV-2 viral copies this assay can detect is 138 copies/mL. A negative result does not preclude SARS-Cov-2 infection and should not be used as the sole basis for treatment or other patient management decisions. A negative result may occur with  improper specimen collection/handling, submission of specimen other than nasopharyngeal swab, presence of viral mutation(s) within the areas targeted by this assay, and inadequate number of viral copies(<138 copies/mL). A negative result must be combined with clinical observations, patient history, and epidemiological information. The expected result is Negative.  Fact Sheet for Patients:  BloggerCourse.com  Fact Sheet for Healthcare Providers:  SeriousBroker.it  This test is no t yet approved or cleared by the [place]  FDA and  has been authorized for detection and/or diagnosis of SARS-CoV-2 by FDA under an Emergency Use Authorization (EUA). This EUA will remain  in effect (meaning this test can be used) for the duration of the COVID-19 declaration under Section 564(b)(1) of the Act, 21 U.S.C.section 360bbb-3(b)(1), unless the authorization is terminated  or revoked sooner.        Influenza A by PCR NEGATIVE NEGATIVE Final   Influenza B by PCR NEGATIVE NEGATIVE Final    Comment: (NOTE) The Xpert Xpress SARS-CoV-2/FLU/RSV plus assay is intended as an aid in the diagnosis of influenza from Nasopharyngeal swab specimens and should not be used as a sole basis for treatment. Nasal washings and aspirates are unacceptable for Xpert Xpress SARS-CoV-2/FLU/RSV testing.  Fact Sheet for Patients: BloggerCourse.com  Fact Sheet for Healthcare Providers: SeriousBroker.it  This test is not yet approved or cleared by the [place]  FDA and has been authorized for detection and/or diagnosis of SARS-CoV-2 by FDA under an Emergency Use Authorization (EUA). This EUA will remain in effect (meaning this test can be used) for the duration of the COVID-19 declaration under Section 564(b)(1) of the Act, 21 U.S.C. section 360bbb-3(b)(1), unless the authorization is terminated or revoked.     Resp Syncytial Virus by PCR NEGATIVE NEGATIVE Final    Comment: (NOTE) Fact Sheet for Patients: BloggerCourse.com  Fact Sheet for Healthcare Providers: SeriousBroker.it  This test is not yet approved or cleared by the [place]  FDA and has been authorized for detection and/or diagnosis of SARS-CoV-2 by FDA under an Emergency Use Authorization (EUA). This EUA will remain in effect (meaning this test can be used) for the duration of the COVID-19 declaration under Section 564(b)(1) of the Act, 21 U.S.C. section 360bbb-3(b)(1), unless the authorization is terminated or revoked.  Performed at [facility], [address] [place], [place] [address]     Labs: CBC: Recent Labs  Lab 05/04/24 1230 05/05/24 0243  WBC 5.7 6.8  NEUTROABS 3.5 4.3  HGB 13.6 13.4  HCT 44.4 44.4  MCV 97.8 97.8  PLT 162 160   Basic Metabolic Panel: Recent Labs  Lab 05/04/24 1230  05/04/24 2033 05/05/24 0243 05/06/24 0309 05/07/24 0330  NA 144  --  142 141 142  K 3.8  --  3.7 3.4* 3.8  CL 105  --  101 97* 99  CO2 29  --  31 35* 33*  GLUCOSE 191*  --  97 100* 91  BUN 14  --  10 15 14   CREATININE 0.62  --  0.57* 0.74 0.65  CALCIUM  9.2  --  8.4* 8.2* 8.5*  MG  --  1.8 1.8  --   --   PHOS  --  4.1 3.8 3.5 2.6   Liver Function Tests: Recent Labs  Lab 05/05/24 0243 05/07/24 0330  AST 20  --   ALT 12  --   ALKPHOS 49  --   BILITOT 2.9*  --   PROT 5.7*  --   ALBUMIN 3.1* 3.1*   CBG: Recent Labs  Lab 05/04/24 2231  GLUCAP 103*    Discharge time spent: {LESS THAN/GREATER THAN:26388} 30 minutes.  Signed: Lavene Penagos Al-Sultani, MD [facility] 05/07/2024

## 2024-05-07 NOTE — Plan of Care (Signed)
  Problem: Education: Goal: Knowledge of General Education information will improve Description: Including pain rating scale, medication(s)/side effects and non-pharmacologic comfort measures Outcome: Progressing   Problem: Health Behavior/Discharge Planning: Goal: Ability to manage health-related needs will improve Outcome: Progressing   Problem: Clinical Measurements: Goal: Ability to maintain clinical measurements within normal limits will improve Outcome: Progressing Goal: Will remain free from infection Outcome: Progressing Goal: Diagnostic test results will improve Outcome: Progressing Goal: Respiratory complications will improve Outcome: Progressing Goal: Cardiovascular complication will be avoided Outcome: Progressing   Problem: Activity: Goal: Risk for activity intolerance will decrease Outcome: Progressing   Problem: Nutrition: Goal: Adequate nutrition will be maintained Outcome: Progressing   Problem: Coping: Goal: Level of anxiety will decrease Outcome: Progressing   Problem: Elimination: Goal: Will not experience complications related to bowel motility Outcome: Progressing Goal: Will not experience complications related to urinary retention Outcome: Progressing   Problem: Pain Managment: Goal: General experience of comfort will improve and/or be controlled Outcome: Progressing   Problem: Safety: Goal: Ability to remain free from injury will improve Outcome: Progressing   Problem: Skin Integrity: Goal: Risk for impaired skin integrity will decrease Outcome: Progressing   Problem: Education: Goal: Ability to describe self-care measures that may prevent or decrease complications (Diabetes Survival Skills Education) will improve Outcome: Progressing Goal: Individualized Educational Video(s) Outcome: Progressing   Problem: Coping: Goal: Ability to adjust to condition or change in health will improve Outcome: Progressing   Problem: Fluid  Volume: Goal: Ability to maintain a balanced intake and output will improve Outcome: Progressing   Problem: Health Behavior/Discharge Planning: Goal: Ability to identify and utilize available resources and services will improve Outcome: Progressing Goal: Ability to manage health-related needs will improve Outcome: Progressing   Problem: Metabolic: Goal: Ability to maintain appropriate glucose levels will improve Outcome: Progressing   Problem: Nutritional: Goal: Maintenance of adequate nutrition will improve Outcome: Progressing Goal: Progress toward achieving an optimal weight will improve Outcome: Progressing   Problem: Skin Integrity: Goal: Risk for impaired skin integrity will decrease Outcome: Progressing   Problem: Tissue Perfusion: Goal: Adequacy of tissue perfusion will improve Outcome: Progressing   Problem: Education: Goal: Ability to demonstrate management of disease process will improve Outcome: Progressing

## 2024-05-07 NOTE — Progress Notes (Signed)
 NIF = -40 cmH20 VC - 1.39 liters  Pt demonstrated excellent effort and understanding.

## 2024-05-07 NOTE — Progress Notes (Signed)
 NIF done   NIF = 22   Pt had good effort and understanding.

## 2024-05-07 NOTE — Progress Notes (Signed)
 Pt refused FVC and NIF.

## 2024-05-07 NOTE — Progress Notes (Signed)
 Mobility Specialist Progress Note;   05/07/24 0916  Mobility  Activity Ambulated with assistance  Level of Assistance Contact guard assist, steadying assist  Assistive Device Front wheel walker  Distance Ambulated (ft) 40 ft  Activity Response Tolerated well  Mobility Referral Yes  Mobility visit 1 Mobility  Mobility Specialist Start Time (ACUTE ONLY) [id number]  Mobility Specialist Stop Time (ACUTE ONLY) 0931  Mobility Specialist Time Calculation (min) (ACUTE ONLY) 15 min   Pt in chair upon arrival, agreeable to minimum mobility w/ encouragement. On RA when arrived. Required MinG assistance to ambulate short distance. SPO2 maintained 89-93% on RA throughout. Pt returned back to chair and left with all needs met. Wife present.   Lauraine Erm Mobility Specialist Please contact via SecureChat or [employer] [phone]

## 2024-05-07 NOTE — Plan of Care (Signed)
  Problem: Education: Goal: Knowledge of General Education information will improve Description: Including pain rating scale, medication(s)/side effects and non-pharmacologic comfort measures Outcome: Adequate for Discharge   Problem: Health Behavior/Discharge Planning: Goal: Ability to manage health-related needs will improve Outcome: Adequate for Discharge   Problem: Clinical Measurements: Goal: Ability to maintain clinical measurements within normal limits will improve Outcome: Adequate for Discharge Goal: Will remain free from infection Outcome: Adequate for Discharge Goal: Diagnostic test results will improve Outcome: Adequate for Discharge Goal: Respiratory complications will improve Outcome: Adequate for Discharge Goal: Cardiovascular complication will be avoided Outcome: Adequate for Discharge   Problem: Activity: Goal: Risk for activity intolerance will decrease Outcome: Adequate for Discharge   Problem: Nutrition: Goal: Adequate nutrition will be maintained Outcome: Adequate for Discharge   Problem: Coping: Goal: Level of anxiety will decrease Outcome: Adequate for Discharge   Problem: Elimination: Goal: Will not experience complications related to bowel motility Outcome: Adequate for Discharge Goal: Will not experience complications related to urinary retention Outcome: Adequate for Discharge   Problem: Pain Managment: Goal: General experience of comfort will improve and/or be controlled Outcome: Adequate for Discharge   Problem: Safety: Goal: Ability to remain free from injury will improve Outcome: Adequate for Discharge   Problem: Skin Integrity: Goal: Risk for impaired skin integrity will decrease Outcome: Adequate for Discharge   Problem: Education: Goal: Ability to describe self-care measures that may prevent or decrease complications (Diabetes Survival Skills Education) will improve Outcome: Adequate for Discharge Goal: Individualized Educational  Video(s) Outcome: Adequate for Discharge   Problem: Coping: Goal: Ability to adjust to condition or change in health will improve Outcome: Adequate for Discharge   Problem: Fluid Volume: Goal: Ability to maintain a balanced intake and output will improve Outcome: Adequate for Discharge   Problem: Health Behavior/Discharge Planning: Goal: Ability to identify and utilize available resources and services will improve Outcome: Adequate for Discharge Goal: Ability to manage health-related needs will improve Outcome: Adequate for Discharge   Problem: Metabolic: Goal: Ability to maintain appropriate glucose levels will improve Outcome: Adequate for Discharge   Problem: Nutritional: Goal: Maintenance of adequate nutrition will improve Outcome: Adequate for Discharge Goal: Progress toward achieving an optimal weight will improve Outcome: Adequate for Discharge   Problem: Skin Integrity: Goal: Risk for impaired skin integrity will decrease Outcome: Adequate for Discharge   Problem: Tissue Perfusion: Goal: Adequacy of tissue perfusion will improve Outcome: Adequate for Discharge   Problem: Education: Goal: Ability to demonstrate management of disease process will improve Outcome: Adequate for Discharge

## 2024-05-08 NOTE — Discharge Summary (Incomplete)
 Physician Discharge Summary   Patient: David [name]. MRN: [id number] DOB: [birth year]  Admit date:     05/04/2024  Discharge date: 05/07/24  Discharge Physician: Duffy Al-Sultani   PCP: Clarice Nottingham, MD   Recommendations at discharge:  {Tip this will not be part of the note when signed- Example include specific recommendations for outpatient follow-up, pending tests to follow-up on. (Optional):26781} Follow up with PCP within 1 week of discharge to go over BP logs to determine resumption or holding of antihypertensives changed during this hospitalization, repeat BMP to check electrolytes, assess volume status and the need for continued daily Lasix  use, possibly repeat ambulatory SpO2 Follow up with Cardiology on 05/18/2024 for further optimization of heart failure GDMT  Discharge Diagnoses: Principal Problem:   Acute exacerbation of CHF (congestive heart failure) (HCC) Active Problems:   CHB (complete heart block), symptomatic   CAD in native artery   OSA (obstructive sleep apnea)   Myasthenia gravis (HCC)   BPH with obstruction/lower urinary tract symptoms   Type 2 diabetes mellitus with unspecified complications (HCC)   Acute HFrEF (heart failure with reduced ejection fraction) (HCC)   Acute urinary retention   Hospital Course: The patient is a 88 year old male with a history of CAD s/p PIC to LAD in 2003, A-fib on Eliquis , HFmrEF (LVEF 40-45%), complete heart block status post PPM, diabetes mellitus, IDA, HLD, HTN, myasthenia gravis on Mestinon  and CellCept , OSA, who presented to the emergency department on 05/04/2024 complaining of worsening dyspnea and fatigue for the past week.  Notably, the patient was in clinic receiving Prolia  injection for osteoporosis when he was noted to have increased work of breathing and significant shortness of breath, and was recommended to present to the ED.  He presented to [facility] ED where he was noted to be hypoxic to the 80s on  room air.  He was started on supplemental oxygen and diuresed with IV Lasix  with significant improvement in symptoms. He was admitted for acute on chronic CHF exacerbation.  #Acute hypoxic respiratory failure secondary to Acute CHF exacerbation -- resolved #Acute on chronic CHF exacerbation - Patient presented with 1 week of worsening DOE and fatigue, noted to have pulmonary rales and bilateral pedal edema.  - Per discussion with the patient and his wife, he appears appears to not be compliant with or even aware any fluid or sodium restrictions that he should be adhering to.  Additionally, although they are meticulous and recording daily BP, HR, and weights, they were unaware of how to utilize his prescription for PRN Lasix . Recently returned from a cruise ship to [place]  where he reports consuming large amounts of seafood, after which he began to notice an increase in his weight, lower extremity swelling, and dyspnea on exertion. - Reports normally sleeping on his side using only 1 pillow - Was hypoxic down to 85 on RA in the ED, necessitating supplemental oxygen with with titration up to 3 L nasal cannula to maintain appropriate SpO2 - CXR findings of cardiomegaly with increased densities in both lung fields concerning for pulmonary edema. - proBNP elevated to 3066 - Responded well to dose of IV Lasix  40 mg given in the ED - Echo (05/05/2024) shows improvement in LVEF to 55-60% (from prior 40 to 45% in 08/2022), no regional wall motion abnormalities, mild concentric left ventricular hypertrophy, mildly reduced RV systolic function, and mild aortic regurgitation. - Per chart review, patient's GDMT previously limited by symptomatic hypotension.  Was unable to tolerate spironolactone  or Entresto   even at lower dose, and was ultimately titrated down to low dose Losartan . His current GDMT regimen includes Coreg  6.25 mg twice daily, Jardiance  10 mg, and losartan  12.5 mg. These were held on admission and  throughout the hospitalization due to low blood pressures. - He was diuresed with IV Lasix  40 mg BID - Although strict I's and O's were ordered, the patient had difficulty complying with constantly having his urine checked but he was at least net -5.2 L since from what was recorded - His O2 requirement was weaned back down to RA and was maintaining SpO2 > 90%. - On the day prior to discharge, he was noted to be in contraction alkalosis and diuresis was dialed back to IV Lasix  40 mg once daily. Overnight, he desatted to the 80   - Daily weights -- weight on admission (10/8) 200 lbs > 189 lbs (10/10)   - Patient's wife will need to be provided with clear written instructions on discharge for when to administer Lasix  and which of his GDMT to continue versus hold if not all are resumed on discharge - Measure proBNP and dry weight on discharge    {Tip this will not be part of the note when signed Body mass index is 27.97 kg/m. , ,  (Optional):26781}  {(NOTE) Pain control PDMP Statment (Optional):26782} Consultants: *** Procedures performed: ***  Disposition: {Plan; Disposition:26390} Diet recommendation:  {Diet_Plan:26776} DISCHARGE MEDICATION: Allergies as of 05/07/2024       Reactions   Azithromycin    Runny nose    Clindamycin Hcl    Runny nose    Doxycycline Diarrhea   Doxycycline Hyclate    unknown   Levofloxacin    unknown   Magnesium    Myasthenia gravis (Mg can precipitate MG crisis)   Niacin    unknown   Gris-peg [griseofulvin] Other (See Comments)   headaches   Terbinafine Other (See Comments)   headache        Medication List     PAUSE taking these medications    carvedilol  6.25 MG tablet Wait to take this until your doctor or other care provider tells you to start again. This medication was held because your blood pressure was low while you were in the hospital.  Keep a log of your blood pressures at home and take that to your next PCP or cardiology  visit for them to assess resumption of this medication.  Commonly known as: COREG  TAKE 1 TABLET(6.25 MG) BY MOUTH TWICE DAILY       TAKE these medications    apixaban  5 MG Tabs tablet Commonly known as: ELIQUIS  Take 1 tablet (5 mg total) by mouth 2 (two) times daily.   desmopressin  0.1 MG tablet Commonly known as: DDAVP  TAKE 1 TABLET(0.1 MG TABLET) BY MOUTH AT BEDTIME. CALL [phone] TO SCHEDULE FOLLOW UP FOR ONGOING REFILLS.   ezetimibe -simvastatin  10-40 MG tablet Commonly known as: VYTORIN  Take 1 tablet by mouth daily.   famotidine  20 MG tablet Commonly known as: Pepcid  One after supper What changed:  how much to take how to take this when to take this additional instructions   furosemide  20 MG tablet Commonly known as: Lasix  Take 1 tablet (20 mg total) by mouth daily. Take an extra 20 mg for weight gain of 3 lbs in 1 day, 5 lbs in one week, worsening ankle swelling, or worsening shortness of breath. Let your provider know if you do so. What changed:  when to take this reasons to take  this additional instructions   ipratropium 0.06 % nasal spray Commonly known as: ATROVENT  Up to 2 puffs every 6 hours as needed   Jardiance  10 MG Tabs tablet Generic drug: empagliflozin  TAKE 1 TABLET BY MOUTH EVERY DAY   loratadine  10 MG tablet Commonly known as: CLARITIN  Take 10 mg by mouth daily.   losartan  25 MG tablet Commonly known as: COZAAR  Take 0.5 tablets (12.5 mg total) by mouth daily.   mycophenolate  250 MG capsule Commonly known as: CELLCEPT  Take 1 capsule (250 mg total) by mouth 2 (two) times daily.   nitroGLYCERIN  0.4 MG SL tablet Commonly known as: NITROSTAT  Place 1 tablet (0.4 mg total) under the tongue every 5 (five) minutes as needed for chest pain.   pantoprazole  40 MG tablet Commonly known as: Protonix  Take 1 tablet (40 mg total) by mouth daily. Take 30-60 min before first meal of the day   pyridostigmine  60 MG tablet Commonly known as:  MESTINON  Take 1 tablet (60 mg total) by mouth 5 (five) times daily. What changed: when to take this   Systane 0.4-0.3 % Soln Generic drug: Polyethyl Glycol-Propyl Glycol Place 1 drop into both eyes daily as needed (dry eyes).   tamsulosin  0.4 MG Caps capsule Commonly known as: FLOMAX  Take 1 capsule (0.4 mg total) by mouth daily.   vitamin D3 50 MCG (2000 UT) Caps Take 1 capsule by mouth daily.               Durable Medical Equipment  (From admission, onward)           Start     Ordered   05/05/24 1616  For home use only DME Walker rolling  Once       Question Answer Comment  Walker: With 5 Inch Wheels   Patient needs a walker to treat with the following condition Weakness      05/05/24 1616            Follow-up Information     Clarice Nottingham, MD. Schedule an appointment as soon as possible for a visit in 1 week(s).   Specialty: Internal Medicine Contact information: [address] [place] [address] [phone]         [facility].   Specialty: Specialist Contact information: [address] [place] [address] [phone]         [facility]. Go in 11 day(s).   Specialty: Cardiology Why: Hospital follow up 05/18/2024 @ 10:30 am PLEASE bring a current medication list to appointment FREE valet parking, Entrance C, poff [employer] [facility] and Neuro[facility]geon information: [address] [place]  [phone]               Discharge Exam: Fredricka Weights   05/04/24 1156 05/05/24 0425 05/06/24 0500  Weight: 90.7 kg 87.1 kg 85.9 kg   ***  Condition at discharge: {DC Condition:26389}  The results of significant diagnostics from this hospitalization (including imaging, microbiology, ancillary and laboratory) are listed below for reference.   Imaging Studies: ECHOCARDIOGRAM COMPLETE Result  Date: 05/05/2024    ECHOCARDIOGRAM REPORT   Patient Name:   Carry Ortez. Date of Exam: 05/05/2024 Medical Rec #:  [id number]             Height:       69.0 in Accession #:    [id number]  Weight:       192.0 lb Date of Birth:  1935/12/19             BSA:          2.030 m Patient Age:    88 years              BP:           128/70 mmHg Patient Gender: M                     HR:           72 bpm. Exam Location:  Inpatient Procedure: 2D Echo, Cardiac Doppler, Color Doppler and Intracardiac            Opacification Agent (Both Spectral and Color Flow Doppler were            utilized during procedure). Indications:    CHF  History:        Patient has prior history of Echocardiogram examinations, most                 recent 09/03/2022. CHF, CAD; Risk Factors:Sleep Apnea,                 Hypertension, Dyslipidemia and Diabetes.  Sonographer:    Philomena Daring Referring Phys: [id number] ANASTASSIA DOUTOVA IMPRESSIONS  1. Left ventricular ejection fraction, by estimation, is 55 to 60%. The left ventricle has normal function. The left ventricle has no regional wall motion abnormalities. There is mild concentric left ventricular hypertrophy. Left ventricular diastolic parameters are indeterminate.  2. Peak RV-RA gradient 28 mmHg. Right ventricular systolic function is mildly reduced. The right ventricular size is mildly enlarged.  3. Left atrial size was mildly dilated.  4. Right atrial size was mildly dilated.  5. The mitral valve is normal in structure. No evidence of mitral valve regurgitation. No evidence of mitral stenosis.  6. The aortic valve is tricuspid. There is mild calcification of the aortic valve. Aortic valve regurgitation is mild. No aortic stenosis is present.  7. Aortic dilatation noted. There is moderate dilatation of the aortic root, measuring 45 mm. There is moderate dilatation of the ascending aorta, measuring 46 mm.  8. IVC not visualized. FINDINGS  Left Ventricle: Left ventricular ejection fraction,  by estimation, is 55 to 60%. The left ventricle has normal function. The left ventricle has no regional wall motion abnormalities. Definity contrast agent was given IV to delineate the left ventricular  endocardial borders. The left ventricular internal cavity size was normal in size. There is mild concentric left ventricular hypertrophy. Left ventricular diastolic parameters are indeterminate. Right Ventricle: Peak RV-RA gradient 28 mmHg. The right ventricular size is mildly enlarged. No increase in right ventricular wall thickness. Right ventricular systolic function is mildly reduced. Left Atrium: Left atrial size was mildly dilated. Right Atrium: Right atrial size was mildly dilated. Pericardium: There is no evidence of pericardial effusion. Mitral Valve: The mitral valve is normal in structure. No evidence of mitral valve regurgitation. No evidence of mitral valve stenosis. Tricuspid Valve: The tricuspid valve is normal in structure. Tricuspid valve regurgitation is mild. Aortic Valve: The aortic valve is tricuspid. There is mild calcification of the aortic valve. Aortic valve regurgitation is mild. No aortic stenosis is present. Pulmonic Valve: The pulmonic valve was normal in structure. Pulmonic valve regurgitation is not visualized. Aorta: Aortic dilatation noted. There is moderate dilatation of the aortic root, measuring 45 mm. There is moderate dilatation of  the ascending aorta, measuring 46 mm. Venous: IVC not visualized. IAS/Shunts: No atrial level shunt detected by color flow Doppler. Additional Comments: A device lead is visualized.  LEFT VENTRICLE PLAX 2D LVIDd:         4.77 cm   Diastology LVIDs:         3.41 cm   LV e' medial:    6.64 cm/s LV PW:         0.86 cm   LV E/e' medial:  14.3 LV IVS:        1.39 cm   LV e' lateral:   8.81 cm/s LVOT diam:     2.57 cm   LV E/e' lateral: 10.8 LV SV:         102 LV SV Index:   50 LVOT Area:     5.19 cm  RIGHT VENTRICLE RV S prime:     8.16 cm/s TAPSE  (M-mode): 1.6 cm LEFT ATRIUM             Index        RIGHT ATRIUM           Index LA diam:        4.41 cm 2.17 cm/m   RA Area:     24.20 cm LA Vol (A2C):   70.3 ml 34.62 ml/m  RA Volume:   62.10 ml  30.58 ml/m LA Vol (A4C):   56.2 ml 27.68 ml/m LA Biplane Vol: 66.7 ml 32.85 ml/m  AORTIC VALVE LVOT Vmax:   91.90 cm/s LVOT Vmean:  59.600 cm/s LVOT VTI:    0.197 m  AORTA Ao Root diam: 4.45 cm Ao Asc diam:  4.58 cm MITRAL VALVE               TRICUSPID VALVE MV Area (PHT): 4.01 cm    TR Peak grad:   28.3 mmHg MV Decel Time: 189 msec    TR Vmax:        266.00 cm/s MV E velocity: 95.20 cm/s                            SHUNTS                            Systemic VTI:  0.20 m                            Systemic Diam: 2.57 cm Dalton McleanMD Electronically signed by Ezra Kanner Signature Date/Time: 05/05/2024/1:20:45 PM    Final    CT HEAD WO CONTRAST ( ) Result Date: 05/04/2024 EXAM: CT HEAD WITHOUT CONTRAST 05/04/2024 09:54:53 PM TECHNIQUE: CT of the head was performed without the administration of intravenous contrast. Automated exposure control, iterative reconstruction, and/or weight based adjustment of the mA/kV was utilized to reduce the radiation dose to as low as reasonably achievable. COMPARISON: CT head 04/21/2016 CLINICAL HISTORY: Motor neuron disease suspected. FINDINGS: BRAIN AND VENTRICLES: No acute hemorrhage. No evidence of acute infarct. No hydrocephalus. No extra-axial collection. No mass effect or midline shift. ORBITS: No acute abnormality. SINUSES: No acute abnormality. SOFT TISSUES AND SKULL: No skull fracture. IMPRESSION: 1. No acute intracranial abnormality. Electronically signed by: Gilmore Molt MD 05/04/2024 11:56 PM EDT RP Workstation: [id number]   DG Chest 2 View Result Date: 05/04/2024 CLINICAL DATA:  Shortness of breath.  Fatigue and dizziness. EXAM: CHEST - 2 VIEW  COMPARISON:  03/09/2024 FINDINGS: Decreased lung volumes. Increased densities in both lungs are concerning for  pulmonary edema. Cardiomegaly with a left chest dual lead pacemaker. Negative for a pneumothorax. Blunting at the right costophrenic angle and cannot exclude a small right pleural effusion. IMPRESSION: 1. Cardiomegaly with increased densities in both lungs. Findings are concerning for pulmonary edema. 2. Possible small right pleural effusion. Electronically Signed   By: Juliene Balder M.D.   On: 05/04/2024 13:20   DG SWALLOW FUNC OP MEDICARE SPEECH PATH Result Date: 04/20/2024 Table formatting from the original result was not included. Modified Barium Swallow Study Patient Details Name: Jearld Hemp. MRN: [id number] Date of Birth: [birth year] Today's Date: 04/20/2024 HPI/PMH: HPI: 88 yo male referred for MBS by Dr Darlean. Pt PMH + Ocular Myasthenia Gravis x20-30 years, HLD, HTN, ataxia, spinal stenosis, allergic rhinitis, ataxia and weakness with walking and left more than right leg edema.  Pt also with recent issues with chronic throat clearing and coughing, which he attributes to secretions.  He reports occasional choking with foods - not liquids.  Of note, he underwent imaging of cervical spine with concerns for possible right vallecula mass - s/p scoping by ENT *Dr Karis* in 2018? which was negative.  Dr Darlean, pulmonology, started pt on regimen of PPI 30 minutes before breakfast and an H2blocker after supper.  He also advised pt avoid any mint.  Also recommend avoid throat clearing using intentional swallow instead.  Pt also with h/o left facial inflammatory changes prompting ED visit - imaging of neck concerning for parotitis with sialoadenitis of left submandibular and parotid glands, presumed to be viral. Pt reports largest complaint re: swallowing is his congestion  stating If I could get rid of that, I would be fine.  He denies any issues with reflux, recurrent pneumonias, requiring heimlich manuever nor having weight loss.  He reports weight gain after having traveled on cruises. Clinical Impression:  Clinical Impression: Patient presents with overall functional oropharyngeal swallow ability. Variance in swallowing included pt triggering swallow at vallecular space with cracker and pyriform with puree and liquids.  Pt also talking prior to initiating swallow with solids filling vallecular space - He did elicit a swallow per cue.  Trace laryngeal penetration of ultrathin *1/2 thin barium, 1/2 water* to cords with large sequential swallows. Cued cough effectively cleared penetration. Of note, pt did not cough or clear his throat during the MBS.  However prior to MBS, he demonstrated chronic throat clearing both during interview and after water intake.   He did report sensation of regurgitation of the 3 ounces of water provided during [facility] screen.  Recommend continue diet with general precautions. Factors that may increase risk of adverse event in presence of aspiration [name] 2021): Factors that may increase risk of adverse event in presence of aspiration [name] 2021): Respiratory or GI disease; Limited mobility; Frail or deconditioned Recommendations/Plan: Swallowing Evaluation Recommendations Swallowing Evaluation Recommendations Recommendations: PO diet PO Diet Recommendation: Regular; Thin liquids (Level 0) Liquid Administration via: Cup; Straw Medication Administration: Whole meds with liquid Supervision: Patient able to self-feed Swallowing strategies  : Slow rate; Small bites/sips Postural changes: Stay upright 30-60 min after meals; Out of bed for meals Oral care recommendations: Oral care BID (2x/day) Treatment Plan Treatment Plan Treatment recommendations: No treatment recommended at this time Recommendations Recommendations for follow up therapy are one component of a multi-disciplinary discharge planning process, led by the attending physician.  Recommendations may be updated based on  patient status, additional functional criteria and insurance authorization. Assessment: Orofacial  Exam: Orofacial Exam Oral Cavity: Oral Hygiene: WFL Oral Cavity - Dentition: Adequate natural dentition Orofacial Anatomy: WFL Oral Motor/Sensory Function: Other (comment) (suspect subtle left facial asymmetry, pt with slight head tilt to the right) Anatomy: Anatomy: WFL Boluses Administered: Boluses Administered Boluses Administered: Thin liquids (Level 0); Mildly thick liquids (Level 2, nectar thick); Moderately thick liquids (Level 3, honey thick); Puree; Solid  Oral Impairment Domain: Oral Impairment Domain Lip Closure: No labial escape Tongue control during bolus hold: Posterior escape of less than half of bolus (with thin liquid via straw x1) Bolus preparation/mastication: Slow prolonged chewing/mashing with complete recollection (pt reports he masticates well at baseline) Bolus transport/lingual motion: Brisk tongue motion Oral residue: Trace residue lining oral structures Location of oral residue : Tongue Initiation of pharyngeal swallow : Posterior laryngeal surface of the epiglottis; Valleculae  Pharyngeal Impairment Domain: Pharyngeal Impairment Domain Soft palate elevation: No bolus between soft palate (SP)/pharyngeal wall (PW) Laryngeal elevation: Complete superior movement of thyroid  cartilage with complete approximation of arytenoids to epiglottic petiole Anterior hyoid excursion: Complete anterior movement Epiglottic movement: Partial inversion Laryngeal vestibule closure: Incomplete, narrow column air/contrast in laryngeal vestibule Pharyngeal stripping wave : Present - diminished Pharyngeal contraction (A/P view only): -- (DNT due to pt being in incompatible wheelchair for A-P view) Tongue base retraction: Narrow column of contrast or air between tongue base and PPW Pharyngeal residue: Trace residue within or on pharyngeal structures Location of pharyngeal residue: Valleculae (vallecula retention with liquids spilled to pyriform sinus; vallecula retention with solids/puree)  Esophageal Impairment  Domain: Esophageal Impairment Domain Esophageal clearance upright position: Complete clearance, esophageal coating Pill: Pill Consistency administered: Thin liquids (Level 0) Thin liquids (Level 0): [facility] Penetration/Aspiration Scale Score: Penetration/Aspiration Scale Score 1.  Material does not enter airway: Mildly thick liquids (Level 2, nectar thick); Moderately thick liquids (Level 3, honey thick); Puree; Solid; Pill 5.  Material enters airway, CONTACTS cords and not ejected out: Thin liquids (Level 0); Pill Compensatory Strategies: Compensatory Strategies Compensatory strategies: No   General Information: No data recorded Diet Prior to this Study: Regular; Thin liquids (Level 0)   No data recorded  Respiratory Status: WFL   Supplemental O2: None (Room air)   History of Recent Intubation: No  Behavior/Cognition: Alert; Cooperative; Pleasant mood Self-Feeding Abilities: Able to self-feed Baseline vocal quality/speech: Dysphonic (appears slightly hoarse, ? due to chronicity of throat clearing) Volitional Cough: Unable to elicit Volitional Swallow: Able to elicit Exam Limitations: No limitations Goal Planning: No data recorded No data recorded No data recorded No data recorded Consulted and agree with results and recommendations: Patient Pain: No data recorded End of Session: Start Time:SLP Start Time (ACUTE ONLY): 1310 Stop Time: SLP Stop Time (ACUTE ONLY): 1345 Time Calculation:SLP Time Calculation (min) (ACUTE ONLY): 35 min Charges: SLP Evaluations $ SLP Speech Visit: 1 Visit SLP Evaluations $Outpatient MBS Swallow: 1 Procedure SLP visit diagnosis: SLP Visit Diagnosis: Dysphagia, unspecified (R13.10) Past Medical History: Past Medical History: Diagnosis Date . Aortic insufficiency  . Ascending aorta dilation  . Balance problem  . CAD (coronary artery disease)   previous stent to LAD 2003 . Diabetes mellitus without complication (HCC)   No longer taking diabetic medications at this time . GI bleeding  . HFrEF  (heart failure with reduced ejection fraction) (HCC)  . History of complete heart block  . History of kidney stones  . Hyperlipidemia  . Hypertension  . Iron deficiency anemia  . LBBB (  left bundle branch block)  . Melanoma (HCC)  . Myasthenia gravis (HCC)   eyelids, finger, and toes . NSVT (nonsustained ventricular tachycardia) (HCC)  . OSA (obstructive sleep apnea)   Does not wear CPAP. SABRA Presence of permanent cardiac pacemaker  . S/P cardiac pacemaker procedure, placement 10/24/14 Medtronic Adapta L model ADDRL 1  10/25/2014 . Spinal stenosis   Severe . Tricuspid regurgitation  Past Surgical History: Past Surgical History: Procedure Laterality Date . COLONOSCOPY   . CORONARY ANGIOPLASTY WITH STENT PLACEMENT  03/01/2002  stenting to LAD - Dr. Bulah . CYSTOSCOPY/URETEROSCOPY/HOLMIUM LASER/STENT PLACEMENT Left 08/31/2019  Procedure: CYSTOSCOPY/RETROGRADE/STENT PLACEMENT;  Surgeon: Devere Lonni Righter, MD;  Location: [facility];  Service: Urology;  Laterality: Left;  ONY NEEDS 45 MIN . NM MYOCAR PERF WALL MOTION  11/27/2010  normal . PERMANENT PACEMAKER INSERTION N/A 10/24/2014  Procedure: PERMANENT PACEMAKER INSERTION;  Surgeon: Lynwood Rakers, MD;  Location: [facility] CATH LAB;  Service: Cardiovascular;  Laterality: N/A; . TONSILLECTOMY   . TRANSURETHRAL RESECTION OF PROSTATE N/A 07/06/2019  Procedure: TRANSURETHRAL RESECTION OF THE PROSTATE (TURP), BIPOLAR;  Surgeon: Devere Lonni Righter, MD;  Location: [facility];  Service: Urology;  Laterality: N/A; . US  ECHOCARDIOGRAPHY  12/05/2010  proximal septal thickening,mild concentric LVH,mild deptal hypokinesis,RV mildly dilated,LA mildly dilated,mild AI,moderate aortic root dilatation,septal motion c/w conduction abnormality Madelin POUR, MS [facility] SLP Acute Rehab Services Office [phone] Nicolas Emmie Caldron 04/20/2024, 7:16 PM CLINICAL DATA:  History of myasthenia gravis. Having occasional dysphagia. Reported crackles in lower lung fields, concern  for possible aspiration. Consult for modified barium swallow study with speech pathology. EXAM: MODIFIED BARIUM SWALLOW TECHNIQUE: Different consistencies of barium were administered orally to the patient by the Speech Pathologist. Imaging of the pharynx was performed in the lateral projection. Kimble Clas, PA-C was present in the fluoroscopy room during this study, which was supervised and interpreted by Dr. Medford Necessary. FLUOROSCOPY: Radiation Exposure Index (as provided by the fluoroscopic device): 14.6 mGy Kerma COMPARISON:  None Available. FINDINGS: Vestibular Penetration: Intermittent flash penetration with thin barium. Aspiration:  None seen. Other: Early spill to the vallecula and piriform sinus is present with all consistencies. IMPRESSION: Intermittent flash penetration with thin barium.  No aspiration. Early spill to the vallecula and piriform sinuses with all consistencies Please refer to the Speech Pathologists report for complete details and recommendations. Electronically Signed   By: Lonni Necessary M.D.   On: 04/20/2024 16:57   Microbiology: Results for orders placed or performed during the hospital encounter of 05/04/24  Resp panel by RT-PCR (RSV, Flu A&B, Covid) Anterior Nasal Swab     Status: None   Collection Time: 05/04/24 12:30 PM   Specimen: Anterior Nasal Swab  Result Value Ref Range Status   SARS Coronavirus 2 by RT PCR NEGATIVE NEGATIVE Final    Comment: (NOTE) SARS-CoV-2 target nucleic acids are NOT DETECTED.  The SARS-CoV-2 RNA is generally detectable in upper respiratory specimens during the acute phase of infection. The lowest concentration of SARS-CoV-2 viral copies this assay can detect is 138 copies/mL. A negative result does not preclude SARS-Cov-2 infection and should not be used as the sole basis for treatment or other patient management decisions. A negative result may occur with  improper specimen collection/handling, submission of specimen  other than nasopharyngeal swab, presence of viral mutation(s) within the areas targeted by this assay, and inadequate number of viral copies(<138 copies/mL). A negative result must be combined with clinical observations, patient history, and epidemiological information. The expected result  is Negative.  Fact Sheet for Patients:  BloggerCourse.com  Fact Sheet for Healthcare Providers:  SeriousBroker.it  This test is no t yet approved or cleared by the [place]  FDA and  has been authorized for detection and/or diagnosis of SARS-CoV-2 by FDA under an Emergency Use Authorization (EUA). This EUA will remain  in effect (meaning this test can be used) for the duration of the COVID-19 declaration under Section 564(b)(1) of the Act, 21 U.S.C.section 360bbb-3(b)(1), unless the authorization is terminated  or revoked sooner.       Influenza A by PCR NEGATIVE NEGATIVE Final   Influenza B by PCR NEGATIVE NEGATIVE Final    Comment: (NOTE) The Xpert Xpress SARS-CoV-2/FLU/RSV plus assay is intended as an aid in the diagnosis of influenza from Nasopharyngeal swab specimens and should not be used as a sole basis for treatment. Nasal washings and aspirates are unacceptable for Xpert Xpress SARS-CoV-2/FLU/RSV testing.  Fact Sheet for Patients: BloggerCourse.com  Fact Sheet for Healthcare Providers: SeriousBroker.it  This test is not yet approved or cleared by the [place]  FDA and has been authorized for detection and/or diagnosis of SARS-CoV-2 by FDA under an Emergency Use Authorization (EUA). This EUA will remain in effect (meaning this test can be used) for the duration of the COVID-19 declaration under Section 564(b)(1) of the Act, 21 U.S.C. section 360bbb-3(b)(1), unless the authorization is terminated or revoked.     Resp Syncytial Virus by PCR NEGATIVE NEGATIVE Final     Comment: (NOTE) Fact Sheet for Patients: BloggerCourse.com  Fact Sheet for Healthcare Providers: SeriousBroker.it  This test is not yet approved or cleared by the [place]  FDA and has been authorized for detection and/or diagnosis of SARS-CoV-2 by FDA under an Emergency Use Authorization (EUA). This EUA will remain in effect (meaning this test can be used) for the duration of the COVID-19 declaration under Section 564(b)(1) of the Act, 21 U.S.C. section 360bbb-3(b)(1), unless the authorization is terminated or revoked.  Performed at [facility], [address], [place], [place] [address]     Labs: CBC: Recent Labs  Lab 05/04/24 1230 05/05/24 0243  WBC 5.7 6.8  NEUTROABS 3.5 4.3  HGB 13.6 13.4  HCT 44.4 44.4  MCV 97.8 97.8  PLT 162 160   Basic Metabolic Panel: Recent Labs  Lab 05/04/24 1230 05/04/24 2033 05/05/24 0243 05/06/24 0309 05/07/24 0330  NA 144  --  142 141 142  K 3.8  --  3.7 3.4* 3.8  CL 105  --  101 97* 99  CO2 29  --  31 35* 33*  GLUCOSE 191*  --  97 100* 91  BUN 14  --  10 15 14   CREATININE 0.62  --  0.57* 0.74 0.65  CALCIUM  9.2  --  8.4* 8.2* 8.5*  MG  --  1.8 1.8  --   --   PHOS  --  4.1 3.8 3.5 2.6   Liver Function Tests: Recent Labs  Lab 05/05/24 0243 05/07/24 0330  AST 20  --   ALT 12  --   ALKPHOS 49  --   BILITOT 2.9*  --   PROT 5.7*  --   ALBUMIN 3.1* 3.1*   CBG: Recent Labs  Lab 05/04/24 2231  GLUCAP 103*    Discharge time spent: {LESS THAN/GREATER THAN:26388} 30 minutes.  Signed: Samary Shatz Al-Sultani, MD [facility] 05/07/2024

## 2024-05-09 ENCOUNTER — Telehealth: Payer: Self-pay

## 2024-05-09 NOTE — Transitions of Care (Post Inpatient/ED Visit) (Signed)
   05/09/2024  Name: David [name]. MRN: [id number] DOB: 10-24-1935  Today's TOC FU Call Status: Today's TOC FU Call Status:: Unsuccessful Call (1st Attempt) Unsuccessful Call (1st Attempt) Date: 05/09/24  Attempted to reach the patient regarding the most recent Inpatient/ED visit.  Follow Up Plan: Additional outreach attempts will be made to reach the patient to complete the Transitions of Care (Post Inpatient/ED visit) call.   Jayke Caul J. Ardel Jagger RN, MSN [facility], [facility] RN Care Manager Direct Dial: [phone]  Fax: [phone] Website: [email or web address]

## 2024-05-10 ENCOUNTER — Telehealth: Payer: Self-pay

## 2024-05-10 NOTE — Transitions of Care (Post Inpatient/ED Visit) (Signed)
 05/10/2024  Name: David [name]. MRN: [id number] DOB: 08/30/1935  Today's TOC FU Call Status: Today's TOC FU Call Status:: Successful TOC FU Call Completed TOC FU Call Complete Date: 05/10/24 Patient's Name and Date of Birth confirmed.  Transition Care Management Follow-up Telephone Call Date of Discharge: 05/07/24 Discharge Facility: [facility]) Type of Discharge: Inpatient Admission Primary Inpatient Discharge Diagnosis:: Acute exacerbation of CHF How have you been since you were released from the hospital?: Better Any questions or concerns?: No  Items Reviewed: Did you receive and understand the discharge instructions provided?: Yes Medications obtained,verified, and reconciled?: Yes (Medications Reviewed) Any new allergies since your discharge?: No Dietary orders reviewed?: Yes Type of Diet Ordered:: 2 g Sodium Do you have support at home?: Yes People in Home [RPT]: spouse Name of Support/Comfort Primary Source: [name]  Medications Reviewed Today: Medications Reviewed Today     Reviewed by Talyn Dessert, RN (Case Manager) on 05/10/24 at 1414  Med List Status: <None>   Medication Order Taking? Sig Documenting Provider Last Dose Status Informant  apixaban  (ELIQUIS ) 5 MG TABS tablet [id number] Yes Take 1 tablet (5 mg total) by mouth 2 (two) times daily. Pietro Redell RAMAN, MD  Active Spouse/Significant Other  carvedilol  (COREG ) 6.25 MG tablet [id number]  TAKE 1 TABLET(6.25 MG) BY MOUTH TWICE DAILY Crenshaw, Redell RAMAN, MD  Active Spouse/Significant Other  Cholecalciferol (VITAMIN D3) 50 MCG (2000 UT) CAPS [id number] Yes Take 1 capsule by mouth daily. [provider]  Active Spouse/Significant Other  desmopressin  (DDAVP ) 0.1 MG tablet [id number] Yes TAKE 1 TABLET(0.1 MG TABLET) BY MOUTH AT BEDTIME. CALL [phone] TO SCHEDULE FOLLOW UP FOR ONGOING REFILLS. Sater, Charlie LABOR, MD  Active Spouse/Significant Other  empagliflozin  (JARDIANCE ) 10 MG TABS tablet [id number]  Yes TAKE 1 TABLET BY MOUTH EVERY DAY Crenshaw, Redell RAMAN, MD  Active Spouse/Significant Other  ezetimibe -simvastatin  (VYTORIN ) 10-40 MG tablet [id number] Yes Take 1 tablet by mouth daily. [provider]  Active Spouse/Significant Other  famotidine  (PEPCID ) 20 MG tablet [id number] Yes One after supper  Patient taking differently: Take 20 mg by mouth every evening.   Darlean Ozell NOVAK, MD  Active Spouse/Significant Other  furosemide  (LASIX ) 20 MG tablet [id number] Yes Take 1 tablet (20 mg total) by mouth daily. Take an extra 20 mg for weight gain of 3 lbs in 1 day, 5 lbs in one week, worsening ankle swelling, or worsening shortness of breath. Let your provider know if you do so. Al-Sultani, Anmar, MD  Active   ipratropium (ATROVENT ) 0.06 % nasal spray [id number] Yes Up to 2 puffs every 6 hours as needed Darlean Ozell NOVAK, MD  Active Spouse/Significant Other  loratadine  (CLARITIN ) 10 MG tablet [id number] Yes Take 10 mg by mouth daily. [provider]  Active Spouse/Significant Other  losartan  (COZAAR ) 25 MG tablet [id number] Yes Take 0.5 tablets (12.5 mg total) by mouth daily. Meng, Hao, [place]  Active Spouse/Significant Other  mycophenolate  (CELLCEPT ) 250 MG capsule [id number] Yes Take 1 capsule (250 mg total) by mouth 2 (two) times daily. Sater, Charlie LABOR, MD  Active Spouse/Significant Other  nitroGLYCERIN  (NITROSTAT ) 0.4 MG SL tablet [id number] Yes Place 1 tablet (0.4 mg total) under the tongue every 5 (five) minutes as needed for chest pain. Vannie Reche RAMAN, NP  Active Spouse/Significant Other  pantoprazole  (PROTONIX ) 40 MG tablet [id number] Yes Take 1 tablet (40 mg total) by mouth daily. Take 30-60 min before first meal of the day Darlean Ozell NOVAK, MD  Active Spouse/Significant Other  Polyethyl Glycol-Propyl  Glycol (SYSTANE) 0.4-0.3 % SOLN [id number] Yes Place 1 drop into both eyes daily as needed (dry eyes). [provider]  Active Spouse/Significant Other  pyridostigmine  (MESTINON ) 60 MG  tablet [id number] Yes Take 1 tablet (60 mg total) by mouth 5 (five) times daily. Sater, Charlie LABOR, MD  Active Spouse/Significant Other           Med Note (SATTERFIELD, DARIUS E   Wed May 04, 2024  8:55 PM) Wife states he just needs one more to take for tonight. Wife did verify he only takes 4 times a day   tamsulosin  (FLOMAX ) 0.4 MG CAPS capsule [id number] Yes Take 1 capsule (0.4 mg total) by mouth daily. Arvil Kate BIRCH, NP  Active Spouse/Significant Other            Home Care and Equipment/Supplies: Were Home Health Services Ordered?: NA Any new equipment or medical supplies ordered?: NA  Functional Questionnaire: Do you need assistance with bathing/showering or dressing?: No Do you need assistance with meal preparation?: No Do you need assistance with eating?: No Do you have difficulty maintaining continence: No Do you need assistance with getting out of bed/getting out of a chair/moving?: No Do you have difficulty managing or taking your medications?: No  Follow up appointments reviewed: PCP Follow-up appointment confirmed?: Yes Date of PCP follow-up appointment?: 05/16/24 Follow-up Provider: Dr.[facility] Follow-up appointment confirmed?: Yes Date of Specialist follow-up appointment?: 05/18/24 Follow-Up Specialty Provider:: Cardiology Do you need transportation to your follow-up appointment?: No Do you understand care options if your condition(s) worsen?: Yes-patient verbalized understanding  SDOH Interventions Today    Flowsheet Row Most Recent Value  SDOH Interventions   Food Insecurity Interventions Intervention Not Indicated  Housing Interventions Intervention Not Indicated  Transportation Interventions Intervention Not Indicated  Utilities Interventions Intervention Not Indicated    Quinterrius Errington J. Dakota Vanwart RN, MSN [facility]  [facility], [facility] RN Care Manager Direct Dial: [phone]  Fax: [phone] Website:  [email or web address]

## 2024-05-10 NOTE — Patient Instructions (Signed)
 Visit Information  Thank you for taking time to visit with me today.   Report to your doctor weight gain of 2 -3 pounds in a day or 5 pounds in a week Please weight daily or as ordered by your doctor Limit salt intake Monitor for shortness of breath, swelling of feet, ankles or abdomen and weight gain. Never use the saltshaker.  Read all food labels and avoid canned, processed, and pickled foods.   Follow your doctor's recommendations for daily salt intake Watch for bleeding while on Blood Thinners--watch for blood in your stool which can make your stool black, maroon, mahogany or red---, blood in your urine which can make your urine pink or red, nosebleeds , also watch for possible bruising     Patient verbalizes understanding of instructions and care plan provided today and agrees to view in MyChart. Active MyChart status and patient understanding of how to access instructions and care plan via MyChart confirmed with patient.     The patient has been provided with contact information for the care management team and has been advised to call with any health related questions or concerns.   Please call the care guide team at [phone] if you need to cancel or reschedule your appointment.   Please call the Suicide and Crisis Lifeline: 988 if you are experiencing a Mental Health or Behavioral Health Crisis or need someone to talk to.  Nathalee Smarr J. Tauna Macfarlane RN, MSN [facility], [facility] RN Care Manager Direct Dial: [phone]  Fax: [phone] Website: [email or web address]

## 2024-05-11 ENCOUNTER — Other Ambulatory Visit: Payer: Self-pay | Admitting: Internal Medicine

## 2024-05-11 DIAGNOSIS — R058 Other specified cough: Secondary | ICD-10-CM

## 2024-05-11 DIAGNOSIS — R531 Weakness: Secondary | ICD-10-CM | POA: Diagnosis not present

## 2024-05-16 DIAGNOSIS — Z09 Encounter for follow-up examination after completed treatment for conditions other than malignant neoplasm: Secondary | ICD-10-CM | POA: Diagnosis not present

## 2024-05-16 DIAGNOSIS — I5022 Chronic systolic (congestive) heart failure: Secondary | ICD-10-CM | POA: Diagnosis not present

## 2024-05-16 DIAGNOSIS — Z23 Encounter for immunization: Secondary | ICD-10-CM | POA: Diagnosis not present

## 2024-05-17 ENCOUNTER — Telehealth (HOSPITAL_COMMUNITY): Payer: Self-pay

## 2024-05-17 NOTE — Telephone Encounter (Signed)
 Called to confirm/remind patient of their appointment at [facility] on 05/18/24 10:30.   Appointment:   [] Confirmed  [x] Left mess   [] No answer/No voice mail  [] VM Full/unable to leave message  [] Phone not in service  Patient reminded to bring all medications and/or complete list.  Confirmed patient has transportation. Gave directions, instructed to utilize valet parking.

## 2024-05-17 NOTE — Progress Notes (Signed)
 HEART & VASCULAR TRANSITION OF CARE CONSULT NOTE    Referring Physician: Dr. Mosie PCP: Clarice Nottingham, MD  Cardiologist: Redell Shallow, MD   Chief Complaint: Chronic HFrEF  HPI: Referred to clinic by Dr. Mosie for heart failure consultation.   David [name]. is a 88 y.o. male with chronic HFrEF, CAD (PCI to LAD 03'), HTN, myasthenia gravis, LBBB, new onset a fib 7/25 (started on eliquis ), CHB s/p PPM.   See cardiac studies below.   Has had several admission for A/C HFrEF. Heart catheterization previously considered but team opted for medical mgmt 2/2 advanced age and medical comorbidities. At another admission GDMT had to be cut back 2/2 hypotension.   Latest admission was earlier this month for A/C HFrEF and acute resp failure. Placed on O2 via [place] and diuresed well with IV lasix . Echo during admission with EF 55-60%, no RWMA, mild concentric LVH, mildly reduced RV, mild aortic regurgitation. Transitioned to PO diuretics at discharge.   Today he presents for AHF [facility] clinic visit with his wife. Overall feeling fine. Denies palpitations, CP, dizziness, edema, or PND/Orthopnea. SOB with prolonged exertion. Appetite too good, tries to watch what he eats but they eat out a lot. Their go to is [employer]. Other favorites include BLTs and hot dogs. No fever or chills. Weight at home 183-183 pounds. HR 70s-80s. SBP 120s-130s. Taking all medications. Drinks ~4 16 oz water bottles/day.  Denies ETOH, tobacco or drug use.   Cardiac Testing  Echo 10/25: EF 55-60%, no RWMA, mild concentric LVH, mildly reduced RV, mild aortic regurgitation Echo 2/24: EF 40-45%, GHK, normal RV, trivial MR, mod MR Echo 2/23: EF 40-45%, trivial MR, mod AI Echo 8/22: EF 20 to 25%, mild LV enlargement, mild LVH, GIDD, mild RV enlargement, moderate LAE, severe RAE, mild MR, mod TR, mod to severe AI, dilated ascending aorta measuring at 45 mm  Myoview 2016: EF 43%, inferior thinning but no ischemia.    Past Medical History:  Diagnosis Date   Aortic insufficiency    Ascending aorta dilation    Balance problem    CAD (coronary artery disease)    previous stent to LAD 2003   Diabetes mellitus without complication (HCC)    No longer taking diabetic medications at this time   GI bleeding    HFrEF (heart failure with reduced ejection fraction) (HCC)    History of complete heart block    History of kidney stones    Hyperlipidemia    Hypertension    Iron deficiency anemia    LBBB (left bundle branch block)    Melanoma (HCC)    Myasthenia gravis (HCC)    eyelids, finger, and toes   NSVT (nonsustained ventricular tachycardia) (HCC)    OSA (obstructive sleep apnea)    Does not wear CPAP.   Presence of permanent cardiac pacemaker    S/P cardiac pacemaker procedure, placement 10/24/14 Medtronic Adapta L model ADDRL 1  10/25/2014   Spinal stenosis    Severe   Tricuspid regurgitation     Current Outpatient Medications  Medication Sig Dispense Refill   apixaban  (ELIQUIS ) 5 MG TABS tablet Take 1 tablet (5 mg total) by mouth 2 (two) times daily. 60 tablet 6   [Paused] carvedilol  (COREG ) 6.25 MG tablet TAKE 1 TABLET(6.25 MG) BY MOUTH TWICE DAILY 180 tablet 3   Cholecalciferol (VITAMIN D3) 50 MCG (2000 UT) CAPS Take 1 capsule by mouth daily.     desmopressin  (DDAVP ) 0.1 MG tablet TAKE 1  TABLET(0.1 MG TABLET) BY MOUTH AT BEDTIME. CALL [phone] TO SCHEDULE FOLLOW UP FOR ONGOING REFILLS. 90 tablet 1   empagliflozin  (JARDIANCE ) 10 MG TABS tablet TAKE 1 TABLET BY MOUTH EVERY DAY 90 tablet 3   ezetimibe -simvastatin  (VYTORIN ) 10-40 MG tablet Take 1 tablet by mouth daily.     famotidine  (PEPCID ) 20 MG tablet One after supper (Patient taking differently: Take 20 mg by mouth every evening.) 30 tablet 11   furosemide  (LASIX ) 20 MG tablet Take 1 tablet (20 mg total) by mouth daily. Take an extra 20 mg for weight gain of 3 lbs in 1 day, 5 lbs in one week, worsening ankle swelling, or worsening  shortness of breath. Let your provider know if you do so. 30 tablet 1   ipratropium (ATROVENT ) 0.06 % nasal spray Up to 2 puffs every 6 hours as needed 15 mL 12   loratadine  (CLARITIN ) 10 MG tablet Take 10 mg by mouth daily.     losartan  (COZAAR ) 25 MG tablet Take 0.5 tablets (12.5 mg total) by mouth daily. 45 tablet 1   mycophenolate  (CELLCEPT ) 250 MG capsule Take 1 capsule (250 mg total) by mouth 2 (two) times daily. 180 capsule 3   nitroGLYCERIN  (NITROSTAT ) 0.4 MG SL tablet Place 1 tablet (0.4 mg total) under the tongue every 5 (five) minutes as needed for chest pain. 25 tablet 5   pantoprazole  (PROTONIX ) 40 MG tablet TAKE 1 TABLET(40 MG) BY MOUTH DAILY 30 TO 60 MINUTES BEFORE FIRST MEAL OF THE DAY 90 tablet 1   Polyethyl Glycol-Propyl Glycol (SYSTANE) 0.4-0.3 % SOLN Place 1 drop into both eyes daily as needed (dry eyes).     pyridostigmine  (MESTINON ) 60 MG tablet Take 1 tablet (60 mg total) by mouth 5 (five) times daily. 150 tablet 11   tamsulosin  (FLOMAX ) 0.4 MG CAPS capsule Take 1 capsule (0.4 mg total) by mouth daily. 60 capsule 2   No current facility-administered medications for this visit.    Allergies  Allergen Reactions   Azithromycin     Runny nose    Clindamycin Hcl     Runny nose    Doxycycline Diarrhea   Doxycycline Hyclate     unknown   Levofloxacin     unknown   Magnesium     Myasthenia gravis (Mg can precipitate MG crisis)   Niacin     unknown   Gris-Peg [Griseofulvin] Other (See Comments)    headaches   Terbinafine Other (See Comments)    headache      Social History   Socioeconomic History   Marital status: Married    Spouse name: [name]   Number of children: 4   Years of education: Not on file   Highest education level: High school graduate  Occupational History   Occupation: Retired  Tobacco Use   Smoking status: Former    Current packs/day: 0.00    Types: Cigarettes, Cigars    Start date: 04/28/1949    Quit date: 10/27/1982    Years since  quitting: 41.5   Smokeless tobacco: Never  Vaping Use   Vaping status: Never Used  Substance and Sexual Activity   Alcohol  use: Not Currently   Drug use: No   Sexual activity: Not on file  Other Topics Concern   Not on file  Social History Narrative   Not on file   Social Drivers of Health   Financial Resource Strain: Low Risk  (05/06/2024)   Overall Financial Resource Strain (CARDIA)    Difficulty of  Paying Living Expenses: Not hard at all  Food Insecurity: No Food Insecurity (05/10/2024)   Hunger Vital Sign    Worried About Running Out of Food in the Last Year: Never true    Ran Out of Food in the Last Year: Never true  Transportation Needs: No Transportation Needs (05/10/2024)   PRAPARE - Administrator, Civil Service (Medical): No    Lack of Transportation (Non-Medical): No  Physical Activity: Not on file  Stress: Not on file  Social Connections: Unknown (05/06/2024)   Social Connection and Isolation Panel    Frequency of Communication with Friends and Family: Never    Frequency of Social Gatherings with Friends and Family: Never    Attends Religious Services: Never    Database administrator or Organizations: No    Attends Banker Meetings: Never    Marital Status: Not on file  Intimate Partner Violence: Patient Unable To Answer (05/10/2024)   Humiliation, Afraid, Rape, and Kick questionnaire    Fear of Current or Ex-Partner: Patient unable to answer    Emotionally Abused: Patient unable to answer    Physically Abused: Patient unable to answer    Sexually Abused: Patient unable to answer      Family History  Problem Relation Age of Onset   Breast cancer Mother    Colon cancer Father    Lung cancer Father     There were no vitals filed for this visit.  PHYSICAL EXAM: General:  elderly appearing.  No respiratory difficulty. Walked into clinic Neck: JVD ~7/8 cm.  Cor: Regular rate & rhythm. No murmurs. Lungs: clear, crackles  LLL Extremities: non-pitting BLE edema  Neuro: alert & oriented x 3. Affect pleasant.   ECG: none today  MDT device interrogation: AP-VS <0.1% Appears to have no AT/AF since earlier this month   ASSESSMENT & PLAN: Chronic systolic HF> chronic HFpEF - Echo 10/25: EF 55-60%, no RWMA, mild concentric LVH, mildly reduced RV, mild aortic regurgitation - Suspect 2/2 LBBB vs ischemia - NYHA II - Volume mildly overloaded. Continue lasix  20 mg daily + PRN - GDMT initially limited by soft BP - Continue losartan  12.5 mg daily - Restart coreg  3.125 mg BID  - Continue Jardiance  10 mg daily - Not a candidate for advanced therapies with advanced age.  - BMET/BNP today adding Mg per PCP request  CAD - s/p PCI to LAD 03' - Continue statin/BB  - Off ASA with Eliquis  - Denies CP  Hypotension - BP now stable. Continue losartan  12.5 mg daily - Restart BB  PAF - Continue Eliquis  5 mg BID - Restart coreg  3.125 mg BID - Plan for possible amiodarone/watchman - Appears to no longer be in AF on device interrogation since earlier this month.    Referred to HFSW (PCP, Medications, Transportation, ETOH Abuse, Drug Abuse, Insurance, Financial ):No Refer to Pharmacy: No Refer to Home Health: No Refer to Advanced Heart Failure Clinic: No  Refer to General Cardiology: Patient of Dr. Redell Shallow, MD  Follow up as scheduled with [facility].

## 2024-05-18 ENCOUNTER — Ambulatory Visit

## 2024-05-18 ENCOUNTER — Ambulatory Visit (HOSPITAL_COMMUNITY): Payer: Self-pay | Admitting: Internal Medicine

## 2024-05-18 ENCOUNTER — Encounter (HOSPITAL_COMMUNITY): Payer: Self-pay

## 2024-05-18 ENCOUNTER — Ambulatory Visit (HOSPITAL_COMMUNITY)
Admit: 2024-05-18 | Discharge: 2024-05-18 | Disposition: A | Discharge status: Home / Self Care | Attending: Internal Medicine | Admitting: Internal Medicine

## 2024-05-18 VITALS — HR 61 | Ht 69.0 in | Wt 188.0 lb

## 2024-05-18 DIAGNOSIS — Z950 Presence of cardiac pacemaker: Secondary | ICD-10-CM | POA: Diagnosis not present

## 2024-05-18 DIAGNOSIS — Z7984 Long term (current) use of oral hypoglycemic drugs: Secondary | ICD-10-CM | POA: Diagnosis not present

## 2024-05-18 DIAGNOSIS — Z79899 Other long term (current) drug therapy: Secondary | ICD-10-CM | POA: Insufficient documentation

## 2024-05-18 DIAGNOSIS — I442 Atrioventricular block, complete: Secondary | ICD-10-CM | POA: Insufficient documentation

## 2024-05-18 DIAGNOSIS — I083 Combined rheumatic disorders of mitral, aortic and tricuspid valves: Secondary | ICD-10-CM | POA: Diagnosis not present

## 2024-05-18 DIAGNOSIS — I5022 Chronic systolic (congestive) heart failure: Secondary | ICD-10-CM | POA: Diagnosis not present

## 2024-05-18 DIAGNOSIS — I25118 Atherosclerotic heart disease of native coronary artery with other forms of angina pectoris: Secondary | ICD-10-CM | POA: Diagnosis not present

## 2024-05-18 DIAGNOSIS — I110 Hypertensive heart disease with heart failure: Secondary | ICD-10-CM | POA: Insufficient documentation

## 2024-05-18 DIAGNOSIS — I447 Left bundle-branch block, unspecified: Secondary | ICD-10-CM | POA: Diagnosis not present

## 2024-05-18 DIAGNOSIS — G7000 Myasthenia gravis without (acute) exacerbation: Secondary | ICD-10-CM | POA: Diagnosis not present

## 2024-05-18 DIAGNOSIS — I5032 Chronic diastolic (congestive) heart failure: Secondary | ICD-10-CM | POA: Diagnosis not present

## 2024-05-18 DIAGNOSIS — I2510 Atherosclerotic heart disease of native coronary artery without angina pectoris: Secondary | ICD-10-CM | POA: Insufficient documentation

## 2024-05-18 DIAGNOSIS — Z7901 Long term (current) use of anticoagulants: Secondary | ICD-10-CM | POA: Diagnosis not present

## 2024-05-18 DIAGNOSIS — I480 Paroxysmal atrial fibrillation: Secondary | ICD-10-CM | POA: Diagnosis not present

## 2024-05-18 DIAGNOSIS — Z955 Presence of coronary angioplasty implant and graft: Secondary | ICD-10-CM | POA: Insufficient documentation

## 2024-05-18 DIAGNOSIS — I959 Hypotension, unspecified: Secondary | ICD-10-CM | POA: Insufficient documentation

## 2024-05-18 LAB — BASIC METABOLIC PANEL WITH GFR
Anion gap: 6 (ref 5–15)
BUN: 13 mg/dL (ref 8–23)
CO2: 26 mmol/L (ref 22–32)
Calcium: 8.1 mg/dL — ABNORMAL LOW (ref 8.9–10.3)
Chloride: 102 mmol/L (ref 98–111)
Creatinine, Ser: 0.65 mg/dL (ref 0.61–1.24)
GFR, Estimated: >60 mL/min (ref >60)
Glucose, Bld: 122 mg/dL — ABNORMAL HIGH (ref 70–99)
Potassium: 3.7 mmol/L (ref 3.5–5.1)
Sodium: 134 mmol/L — ABNORMAL LOW (ref 135–145)

## 2024-05-18 LAB — MAGNESIUM: Magnesium: 2.2 mg/dL (ref 1.7–2.4)

## 2024-05-18 LAB — BRAIN NATRIURETIC PEPTIDE: B Natriuretic Peptide: 360.8 pg/mL — ABNORMAL HIGH (ref 0.0–100.0)

## 2024-05-18 MED ORDER — CARVEDILOL 3.125 MG PO TABS: 3.1250 mg | ORAL_TABLET | Freq: Two times a day (BID) | ORAL | 3 refills | Status: DC

## 2024-05-18 MED ORDER — NITROGLYCERIN 0.4 MG SL SUBL: 0.4000 mg | SUBLINGUAL_TABLET | SUBLINGUAL | 5 refills | Status: AC | PRN

## 2024-05-18 NOTE — Patient Instructions (Addendum)
 Medication Changes:  REFILL FOR NITROGLYCERIN  SENT TO PHARMACY   START CARVEDILOL  3.125MG  TWICE DAILY   Lab Work:  Labs done today, your results will be available in MyChart, we will contact you for abnormal readings  Follow-Up in: AS SCHEDULED WITH CARDIOLOGY OFFICE   At the [facility], you and your health needs are our priority. We have a designated team specialized in the treatment of Heart Failure. This Care Team includes your primary Heart Failure Specialized Cardiologist (physician), Advanced Practice Providers (APPs- Physician Assistants and Nurse Practitioners), and Pharmacist who all work together to provide you with the care you need, when you need it.   You may see any of the following providers on your designated Care Team at your next follow up:  Dr. Toribio Fuel Dr. Ezra Shuck Dr. Ria Commander Dr. Odis Brownie Greig Mosses, NP Caffie Shed, [place] [facility] [place], [place] Beckey Coe, NP [place] Lee, NP Tinnie Redman, PharmD   Please be sure to bring in all your medications bottles to every appointment.   Need to Contact Us :  If you have any questions or concerns before your next appointment please send us  a message through [place] or call our office at [phone].    TO LEAVE A MESSAGE FOR THE NURSE SELECT OPTION 2, PLEASE LEAVE A MESSAGE INCLUDING: YOUR NAME DATE OF BIRTH CALL BACK NUMBER REASON FOR CALL**this is important as we prioritize the call backs  YOU WILL RECEIVE A CALL BACK THE SAME DAY AS LONG AS YOU CALL BEFORE 4:00 PM

## 2024-05-19 LAB — CUP PACEART REMOTE DEVICE CHECK
Battery Impedance: 1946 Ohm
Battery Remaining Longevity: 32 mo
Battery Voltage: 2.74 V
Date Time Interrogation Session: 20251022104527
Implantable Lead Connection Status: 753985
Implantable Lead Connection Status: 753985
Implantable Lead Implant Date: 20160329
Implantable Lead Implant Date: 20160329
Implantable Lead Location: 753859
Implantable Lead Location: 753860
Implantable Lead Model: 5076
Implantable Lead Model: 5092
Implantable Pulse Generator Implant Date: 20160329
Lead Channel Impedance Value: 390 Ohm
Lead Channel Impedance Value: 560 Ohm
Lead Channel Pacing Threshold Amplitude: 0.5 V
Lead Channel Pacing Threshold Amplitude: 0.75 V
Lead Channel Pacing Threshold Pulse Width: 0.4 ms
Lead Channel Pacing Threshold Pulse Width: 0.4 ms
Lead Channel Setting Pacing Amplitude: 2 V
Lead Channel Setting Pacing Amplitude: 2.5 V
Lead Channel Setting Pacing Pulse Width: 0.4 ms
Lead Channel Setting Sensing Sensitivity: 2.8 mV
Zone Setting Status: 755011
Zone Setting Status: 755011

## 2024-05-19 NOTE — Telephone Encounter (Signed)
 Pt aware via aware

## 2024-05-20 ENCOUNTER — Ambulatory Visit: Payer: Self-pay | Admitting: Cardiology

## 2024-05-20 NOTE — Progress Notes (Signed)
 HPI: Follow-up atrial fibrillation, coronary artery disease, CHF, hypertension and history of complete heart block status post pacemaker. Patient had PCI of his LAD in 2003.  He had a pacemaker placed in 2016.  Echocardiogram March 2016 showed normal LV function, moderate left ventricular hypertrophy, grade 1 diastolic dysfunction, mild aortic insufficiency, mildly dilated aortic root, moderate tricuspid regurgitation.  Nuclear study September 2016 showed ejection fraction 43%, inferior thinning but no ischemia.  Echocardiogram repeated in August 2022 and showed ejection fraction 20 to 25%, mild left ventricular enlargement, mild left ventricular hypertrophy, grade 1 diastolic dysfunction, mild right ventricular enlargement, moderate left atrial enlargement, severe right atrial enlargement, mild mitral regurgitation, moderate tricuspid regurgitation, moderate to severe aortic insufficiency, dilated ascending aorta measuring 45 mm.  At previous office visit patient was volume overloaded and admitted for IV diuresis and medication adjustment.  Patient was noted to be anemic; placed on PPI and GI recommended no further evaluation.  Plan initially was for cardiac catheterization given reduced LV function but given age and comorbidities medical therapy was pursued.  Following discharge he was readmitted with low blood pressure and Entresto  was discontinued. CTA October 2022 showed 44 mm ascending thoracic aortic aneurysm.  Echo 10/25 showed normal LV function, mild RVE/RV dysfunction; mild biatrial enlargement, dilated ascending aorta (4.6 cm); mild AI. Previously found to be in atrial fibrillation on device check. Recently seen for hypotension (losartan  dosed decreased and spironolactone  DCed). Recently admitted with CHF, improved with diuretics. Since last seen patient denies dyspnea, chest pain, palpitations, syncope or bleeding.  Current Outpatient Medications  Medication Sig Dispense Refill   apixaban   (ELIQUIS ) 5 MG TABS tablet Take 1 tablet (5 mg total) by mouth 2 (two) times daily. 60 tablet 6   carvedilol  (COREG ) 3.125 MG tablet Take 1 tablet (3.125 mg total) by mouth 2 (two) times daily with a meal. 60 tablet 3   Cholecalciferol (VITAMIN D3) 50 MCG (2000 UT) CAPS Take 1 capsule by mouth daily.     desmopressin  (DDAVP ) 0.1 MG tablet TAKE 1 TABLET(0.1 MG TABLET) BY MOUTH AT BEDTIME. CALL [phone] TO SCHEDULE FOLLOW UP FOR ONGOING REFILLS. 90 tablet 1   empagliflozin  (JARDIANCE ) 10 MG TABS tablet TAKE 1 TABLET BY MOUTH EVERY DAY 90 tablet 3   ezetimibe -simvastatin  (VYTORIN ) 10-40 MG tablet Take 1 tablet by mouth daily.     famotidine  (PEPCID ) 20 MG tablet One after supper 30 tablet 11   furosemide  (LASIX ) 20 MG tablet Take 1 tablet (20 mg total) by mouth daily. Take an extra 20 mg for weight gain of 3 lbs in 1 day, 5 lbs in one week, worsening ankle swelling, or worsening shortness of breath. Let your provider know if you do so. 30 tablet 1   ipratropium (ATROVENT ) 0.06 % nasal spray Up to 2 puffs every 6 hours as needed 15 mL 12   loratadine  (CLARITIN ) 10 MG tablet Take 10 mg by mouth daily.     losartan  (COZAAR ) 25 MG tablet Take 0.5 tablets (12.5 mg total) by mouth daily. 45 tablet 1   mycophenolate  (CELLCEPT ) 250 MG capsule Take 1 capsule (250 mg total) by mouth 2 (two) times daily. 180 capsule 3   nitroGLYCERIN  (NITROSTAT ) 0.4 MG SL tablet Place 1 tablet (0.4 mg total) under the tongue every 5 (five) minutes as needed for chest pain. 25 tablet 5   pantoprazole  (PROTONIX ) 40 MG tablet TAKE 1 TABLET(40 MG) BY MOUTH DAILY 30 TO 60 MINUTES BEFORE FIRST MEAL OF THE DAY  90 tablet 1   Polyethyl Glycol-Propyl Glycol (SYSTANE) 0.4-0.3 % SOLN Place 1 drop into both eyes daily as needed (dry eyes).     pyridostigmine  (MESTINON ) 60 MG tablet Take 1 tablet (60 mg total) by mouth 5 (five) times daily. 150 tablet 11   tamsulosin  (FLOMAX ) 0.4 MG CAPS capsule Take 1 capsule (0.4 mg total) by mouth daily.  60 capsule 2   No current facility-administered medications for this visit.     Past Medical History:  Diagnosis Date   Aortic insufficiency    Ascending aorta dilation    Balance problem    CAD (coronary artery disease)    previous stent to LAD 2003   Diabetes mellitus without complication (HCC)    No longer taking diabetic medications at this time   GI bleeding    HFrEF (heart failure with reduced ejection fraction) (HCC)    History of complete heart block    History of kidney stones    Hyperlipidemia    Hypertension    Iron deficiency anemia    LBBB (left bundle branch block)    Melanoma (HCC)    Myasthenia gravis (HCC)    eyelids, finger, and toes   NSVT (nonsustained ventricular tachycardia) (HCC)    OSA (obstructive sleep apnea)    Does not wear CPAP.   Presence of permanent cardiac pacemaker    S/P cardiac pacemaker procedure, placement 10/24/14 Medtronic Adapta L model ADDRL 1  10/25/2014   Spinal stenosis    Severe   Tricuspid regurgitation     Past Surgical History:  Procedure Laterality Date   COLONOSCOPY     CORONARY ANGIOPLASTY WITH STENT PLACEMENT  03/01/2002   stenting to LAD - Dr. Bulah   CYSTOSCOPY/URETEROSCOPY/HOLMIUM LASER/STENT PLACEMENT Left 08/31/2019   Procedure: CYSTOSCOPY/RETROGRADE/STENT PLACEMENT;  Surgeon: Devere Lonni Righter, MD;  Location: [facility];  Service: Urology;  Laterality: Left;  ONY NEEDS 45 MIN   NM MYOCAR PERF WALL MOTION  11/27/2010   normal   PERMANENT PACEMAKER INSERTION N/A 10/24/2014   Procedure: PERMANENT PACEMAKER INSERTION;  Surgeon: Lynwood Rakers, MD;  Location: [facility] CATH LAB;  Service: Cardiovascular;  Laterality: N/A;   TONSILLECTOMY     TRANSURETHRAL RESECTION OF PROSTATE N/A 07/06/2019   Procedure: TRANSURETHRAL RESECTION OF THE PROSTATE (TURP), BIPOLAR;  Surgeon: Devere Lonni Righter, MD;  Location: [facility];  Service: Urology;  Laterality: N/A;   US  ECHOCARDIOGRAPHY   12/05/2010   proximal septal thickening,mild concentric LVH,mild deptal hypokinesis,RV mildly dilated,LA mildly dilated,mild AI,moderate aortic root dilatation,septal motion c/w conduction abnormality    Social History   Socioeconomic History   Marital status: Married    Spouse name: [name]   Number of children: 4   Years of education: Not on file   Highest education level: High school graduate  Occupational History   Occupation: Retired  Tobacco Use   Smoking status: Former    Current packs/day: 0.00    Types: Cigarettes, Cigars    Start date: 04/28/1949    Quit date: 10/27/1982    Years since quitting: 41.6   Smokeless tobacco: Never  Vaping Use   Vaping status: Never Used  Substance and Sexual Activity   Alcohol  use: Not Currently   Drug use: No   Sexual activity: Not on file  Other Topics Concern   Not on file  Social History Narrative   Not on file   Social Drivers of Health   Financial Resource Strain: Low Risk  (05/06/2024)  Overall Financial Resource Strain (CARDIA)    Difficulty of Paying Living Expenses: Not hard at all  Food Insecurity: No Food Insecurity (05/10/2024)   Hunger Vital Sign    Worried About Running Out of Food in the Last Year: Never true    Ran Out of Food in the Last Year: Never true  Transportation Needs: No Transportation Needs (05/10/2024)   PRAPARE - Administrator, Civil Service (Medical): No    Lack of Transportation (Non-Medical): No  Physical Activity: Not on file  Stress: Not on file  Social Connections: Unknown (05/06/2024)   Social Connection and Isolation Panel    Frequency of Communication with Friends and Family: Never    Frequency of Social Gatherings with Friends and Family: Never    Attends Religious Services: Never    Database Administrator or Organizations: No    Attends Banker Meetings: Never    Marital Status: Not on file  Intimate Partner Violence: Patient Unable To Answer (05/10/2024)    Humiliation, Afraid, Rape, and Kick questionnaire    Fear of Current or Ex-Partner: Patient unable to answer    Emotionally Abused: Patient unable to answer    Physically Abused: Patient unable to answer    Sexually Abused: Patient unable to answer    Family History  Problem Relation Age of Onset   Breast cancer Mother    Colon cancer Father    Lung cancer Father     ROS: no fevers or chills, productive cough, hemoptysis, dysphasia, odynophagia, melena, hematochezia, dysuria, hematuria, rash, seizure activity, orthopnea, PND, pedal edema, claudication. Remaining systems are negative.  Physical Exam: Well-developed well-nourished in no acute distress.  Skin is warm and dry.  HEENT is normal.  Neck is supple.  Chest is clear to auscultation with normal expansion.  Cardiovascular exam is regular rate and rhythm.  Abdominal exam nontender or distended. No masses palpated. Extremities show no edema. neuro grossly intact       A/P  1 chronic combined systolic/diastolic congestive heart failure-Continue present dose of Jardiance  and Lasix .  Recent CHF likely exacerbated by atrial fibrillation.  Check potassium and renal function.  2 cardiomyopathy-LV function is mildly reduced on most recent echocardiogram.  This is felt secondary to dyssynchrony/left bundle branch block versus ischemia mediated.  Continue losartan  (he did not tolerate Entresto  previously) and carvedilol . BP will not allow spironolactone .  3 thoracic aortic aneurysm-this was unchanged on most recent echocardiogram.  Plan conservative measures given his age and comorbidities.  4 coronary artery disease-continue statin.  5 aortic insufficiency-mild on most recent echo.   6 hypertension-now with difficulties with hypotension; continue to follow.  7 hyperlipidemia-continue Vytorin .    8 history of pacemaker-managed by electrophysiology.  9 persistent atrial fibrillation-continue apixaban  and carvedilol .  Most  recent device interrogation showed AV pacing.  Redell Shallow, MD

## 2024-05-20 NOTE — Progress Notes (Signed)
 Remote PPM Transmission

## 2024-05-25 ENCOUNTER — Ambulatory Visit: Admitting: Internal Medicine

## 2024-05-25 ENCOUNTER — Encounter: Payer: Self-pay | Admitting: Internal Medicine

## 2024-05-25 VITALS — BP 104/66 | HR 65 | Ht 69.0 in | Wt 189.0 lb

## 2024-05-25 DIAGNOSIS — Z87891 Personal history of nicotine dependence: Secondary | ICD-10-CM | POA: Diagnosis not present

## 2024-05-25 DIAGNOSIS — R0609 Other forms of dyspnea: Secondary | ICD-10-CM | POA: Diagnosis not present

## 2024-05-25 NOTE — Assessment & Plan Note (Addendum)
 Doe/ cough x April 2024  - echo 09/03/22  EF 40-35% mod AI  - 05/06/2023   Walked on RA  x  1  lap(s) =  approx 150  ft  @ nl  pace, stopped due to unsteady on feet  with lowest 02 sats 94%  - 08/20/2023   Walked on RA  x  one  lap(s) =  approx 250  ft  @ slow/cane pace, stopped due to fatigue  with lowest 02 sats 95%   - 01/05/2024   Walked on RA  x  3  lap(s) =  approx 750  ft  @ slow pace, stopped due to end of study with lowest 02 sats 95% and no sob   - 03/31/2024   Walked on RA  x  1  lap(s) =  approx 250  ft  @ slow pace/unsteady gait, held the wall while he walked/refused to use a walker/no SOB  pace, stopped due to fatigue  with lowest 02 sats 91% but improved to 92% before stopped    - See admit 05/04/24 classic pulmonary edema pattern assoc with 10 lb wt gain over baseline around 183-5, leg swelling   No evidence of significant PF contibuting to his symptoms which rapidly responded to diuretics so no further f/u in pulmonary clinic needed         Each maintenance medication was reviewed in detail including emphasizing most importantly the difference between maintenance and prns and under what circumstances the prns are to be triggered using an action plan format where appropriate.  Total time for H and P, chart review, counseling, reviewing  admit details and ways to prevent this from happening again,  and generating customized AVS unique to this office visit / same day charting = 34 min post hosp f/u

## 2024-05-25 NOTE — Progress Notes (Signed)
 David [name]., male    DOB: 1936/02/08    MRN: [id number]   Brief patient profile:  88 yowm quit smoking 1984 (mostly)  s apparent resp sequelae  referred to pulmonary clinic in [place]  05/06/2023 by Dr Clarice  for recurrent cough  x  01/2023 asosoc with pnds x 2014.    History of Present Illness  05/06/2023  Pulmonary/ 1st office eval/ Eph[facility] Carrick / [place] [facility]  Chief Complaint  Patient presents with   Establish Care   Cough  Dyspnea:  limited by weak legs / balance issues > sob  slow pace - usually stays in car  when goes anywhere while fm goes in. Cough: white mucus x one tbsp ended 05/02/23 s obvious reason   Sleep: bed is flat/ one pillow on R side s noct cc  SABA use: none  02: none  Says swallowing fine now but occ chokes on food / daughter is former  ST Pnds x 10 years > allergy testing > ? [place]  pos tomatoes No pets at home  Rec Try off clariton and only take as needed for itchy/ sneezy / runny nose worse than you notice when you don't take it  Make sure you check your oxygen saturation at your highest level of activity(NOT after you stop)  to be sure it stays over 90% and keep track of it at least once a week, more often if breathing getting worse, and let me know if losing ground. (Collect the dots to connect the dots approach - once a week) You may need another formal speech/swallowing  eval but I am deferring this to your daughter and Dr Clarice  Please schedule a follow up visit in 3 months but call sooner if needed  [facility]) bring all medications - including medications    Cxr c/w ILD  05/06/23    01/05/2024  f/u ov/Jamail Cullers re: ? ILD    maint on no resp rx   Chief Complaint  Patient presents with   Follow-up    D.O.E f/u.   Dyspnea:  Not limited by breathing from desired activities   Cough: gone to his satisfaction/ assoc with mild pnds persists assoc with obvious daytime watery rhinorrhea   Sleeping: flat bed  one pillow s noct or early am  resp cc   SABA use: none  02: none  Rec Atrovent  nasal spray  up to 2 puffs  every 6 hours as needed Make sure you check your oxygen saturation at your highest level of activity(NOT after you stop)  to be sure it stays over 90%  Cxr 01/05/24  Lung findings consistent with chronic interstitial fibrosis, without significant change from the previous chest radiograph.   03/31/2024  f/u ov/[place] office/Kymberley Raz re: PF  maint on no rx  Chief Complaint  Patient presents with   Shortness of Breath    SOB with exertion.  Dr. Clarice wanted patient to get checked for crackle sounds in lungs   Dyspnea:  very sedentary/ not lmited by doe  Cough: dry worse with meals though never coughs up food apparently  Sleeping: flat bed one pillow    resp cc  SABA use: none  Patient Instructions  Atrovent  nasa spray as needed for drippy nose Make sure you check your oxygen saturation at your highest level of activity(NOT after you stop)  to be sure it stays over 90%  Pantoprazole  (protonix ) 40 mg   Take  30-60 min before first meal of the day and  Pepcid  (famotidine )  20 mg after supper until return to office  GERD diet reviewed, bed blocks rec  Please schedule a follow up visit in 2 months but call sooner if needed  with all medications /inhalers/ solutions in hand   - MBS    04/20/24 neg     Admit date:     05/04/2024  Discharge date: 05/07/24    Recommendations at discharge:    Follow up with PCP within 1 week of discharge to go over BP logs to determine resumption or holding of antihypertensives changed during this hospitalization, repeat BMP to check electrolytes, assess volume status and the need for continued daily Lasix  use, possibly repeat ambulatory SpO2 Follow up with Cardiology on 05/18/2024 for further optimization of heart failure GDMT   Discharge Diagnoses: Principal Problem:   Acute exacerbation of CHF (congestive heart failure) (HCC)   CHB (complete heart block), symptomatic   CAD in native  artery   OSA (obstructive sleep apnea)   Myasthenia gravis (HCC)   BPH with obstruction/lower urinary tract symptoms   Type 2 diabetes mellitus with unspecified complications (HCC)   Acute HFrEF (heart failure with reduced ejection fraction) (HCC)   Acute urinary retention     Hospital Course: The patient is a 88 year old male with a history of CAD s/p PIC to LAD in 2003, A-fib on Eliquis , HFmrEF (LVEF 40-45%), complete heart block status post PPM, diabetes mellitus, IDA, HLD, HTN, myasthenia gravis on Mestinon  and CellCept , OSA, who presented to the emergency department on 05/04/2024 complaining of worsening dyspnea and fatigue for the past week.  Notably, the patient was in clinic receiving Prolia  injection for osteoporosis when he was noted to have increased work of breathing and significant shortness of breath, and was recommended to present to the ED.   He presented to [facility] ED where he was noted to be hypoxic to the 80s on room air.  He was started on supplemental oxygen and diuresed with IV Lasix  with significant improvement in symptoms. He was admitted for acute on chronic CHF exacerbation.   #Acute hypoxic respiratory failure secondary to Acute CHF exacerbation -- resolved #Acute on chronic CHF exacerbation - Patient presented with 1 week of worsening DOE and fatigue, noted to have pulmonary rales and bilateral pedal edema.  - Per discussion with the patient and his wife, he appears appears to not be compliant with or even aware any fluid or sodium restrictions that he should be adhering to.  Additionally, although they are meticulous and recording daily BP, HR, and weights, they were unaware of how to utilize his prescription for PRN Lasix . Recently returned from a cruise ship to [place]  where he reports consuming large amounts of seafood, after which he began to notice an increase in his weight, lower extremity swelling, and dyspnea on exertion. - Reports normally sleeping on  his side using only 1 pillow - Was hypoxic down to 85 on RA in the ED, necessitating supplemental oxygen with with titration up to 3 L nasal cannula to maintain appropriate SpO2 - CXR findings of cardiomegaly with increased densities in both lung fields concerning for pulmonary edema. - proBNP elevated to 3066 - Responded well to dose of IV Lasix  40 mg given in the ED - Echo (05/05/2024) showed improvement in LVEF to 55-60% (from prior 40 to 45% in 08/2022), no regional wall motion abnormalities, mild concentric left ventricular hypertrophy, mildly reduced RV systolic function, and mild aortic regurgitation. - Per chart review, patient's GDMT previously limited  by symptomatic hypotension.  Was unable to tolerate spironolactone  or Entresto  even at lower dose, and was ultimately titrated down to low dose Losartan . His current GDMT regimen includes Coreg  6.25 mg twice daily, Jardiance  10 mg, and losartan  12.5 mg. These were held on admission and throughout the hospitalization due to low blood pressures. - He was diuresed with IV Lasix  40 mg BID - Although strict I's and O's were ordered, the patient had difficulty complying with constantly having his urine checked but he was at least net -5.2 L since from what was recorded - His O2 requirement was weaned back down to RA and was maintaining SpO2 > 90%. - On the day prior to discharge, he was noted to be in contraction alkalosis and diuresis was dialed back to IV Lasix  40 mg once daily. Overnight, he desatted to the 85 and was placed back on 2L [place], which was weaned back down to RA in the morning.  -That morning morning, the patient's SpO2 was measured during ambulation.  He maintained SpO2 between 89% and 93% on room air throughout. - In light of that, I had an extensive discussion with the patient, his wife, and his son relaying the options to continue to be hospitalized with IV diuretics given his overnight drop in SpO2 versus going home with oral Lasix  to  be taken daily until he is seen by his PCP and/or cardiology.  The patient elected to be discharged that day.  - The patient's family was given clear verbal and written instructions on discharge on taking Lasix  20 mg daily and an extra 20 mg for weight gain of 3 lbs in 1 day, 5 lbs in one week, worsening ankle swelling, or worsening shortness of breath. He was instructed to hold Coreg  until he sees his PCP or cardiology. The patient and his family were extensively educated on the importance of dietary compliance to include a daily sodium restriction of 2 g and daily fluid restriction of 2L.    - Weight on discharge: 186.7 lbs - BNP on discharge: 165   #Myasthenia gravis - Continue Mestinon  60 mg QID and CellCept  - Neurology consulted -- recommended every 6 hour NIF's and VC -- monitor for VC less than 15 to 20 mL/kg and/or NIF less than - 20 cm/H2O.            05/25/2024  f/u ov/Zaxton Angerer re: PFs/p 10 lb diuresis as inpt > much improved    maint on gerd rx s dysphagia    Chief Complaint  Patient presents with   Medical Management of Chronic Issues   Shortness of Breath    Recent admission for CHF. His breathing is unchanged since the last visit.    Dyspnea:   gets off in [facility]  for restautrants/ doctors offices o/w avoid activities like grocery shopping  Cough: still sensation of pnds daytime only - does not recall if atrovent  NS helped  Sleeping: flat bed one pillow s  resp cc  SABA use: none     No obvious day to day or daytime variability or assoc excess/ purulent sputum or mucus plugs or hemoptysis or cp or chest tightness, subjective wheeze or overt sinus or hb symptoms.    Also denies any obvious fluctuation of symptoms with weather or environmental changes or other aggravating or alleviating factors except as outlined above   No unusual exposure hx or h/o childhood pna/ asthma or knowledge of premature birth.  Current Allergies, Complete Past Medical History, Past Surgical History,  Family History, and Social History were reviewed in [name] record.  ROS  The following are not active complaints unless bolded Hoarseness, sore throat, dysphagia, dental problems, itching, sneezing,  nasal congestion or discharge of excess mucus or purulent secretions, ear ache,   fever, chills, sweats, unintended wt loss or wt gain, classically pleuritic or exertional cp,  orthopnea pnd or arm/hand swelling  or leg swelling, presyncope, palpitations, abdominal pain, anorexia, nausea, vomiting, diarrhea  or change in bowel habits or change in bladder habits, change in stools or change in urine, dysuria, hematuria,  rash, arthralgias, visual complaints, headache, numbness, weakness or ataxia or problems with walking or coordination,  change in mood or  memory.        Current Meds  Medication Sig   apixaban  (ELIQUIS ) 5 MG TABS tablet Take 1 tablet (5 mg total) by mouth 2 (two) times daily.   carvedilol  (COREG ) 3.125 MG tablet Take 1 tablet (3.125 mg total) by mouth 2 (two) times daily with a meal.   Cholecalciferol (VITAMIN D3) 50 MCG (2000 UT) CAPS Take 1 capsule by mouth daily.   desmopressin  (DDAVP ) 0.1 MG tablet TAKE 1 TABLET(0.1 MG TABLET) BY MOUTH AT BEDTIME. CALL [phone] TO SCHEDULE FOLLOW UP FOR ONGOING REFILLS.   empagliflozin  (JARDIANCE ) 10 MG TABS tablet TAKE 1 TABLET BY MOUTH EVERY DAY   ezetimibe -simvastatin  (VYTORIN ) 10-40 MG tablet Take 1 tablet by mouth daily.   famotidine  (PEPCID ) 20 MG tablet One after supper   furosemide  (LASIX ) 20 MG tablet Take 1 tablet (20 mg total) by mouth daily. Take an extra 20 mg for weight gain of 3 lbs in 1 day, 5 lbs in one week, worsening ankle swelling, or worsening shortness of breath. Let your provider know if you do so.   ipratropium (ATROVENT ) 0.06 % nasal spray Up to 2 puffs every 6 hours as needed   loratadine  (CLARITIN ) 10 MG tablet Take 10 mg by mouth daily.   losartan  (COZAAR ) 25 MG tablet Take 0.5 tablets  (12.5 mg total) by mouth daily.   mycophenolate  (CELLCEPT ) 250 MG capsule Take 1 capsule (250 mg total) by mouth 2 (two) times daily.   nitroGLYCERIN  (NITROSTAT ) 0.4 MG SL tablet Place 1 tablet (0.4 mg total) under the tongue every 5 (five) minutes as needed for chest pain.   pantoprazole  (PROTONIX ) 40 MG tablet TAKE 1 TABLET(40 MG) BY MOUTH DAILY 30 TO 60 MINUTES BEFORE FIRST MEAL OF THE DAY   Polyethyl Glycol-Propyl Glycol (SYSTANE) 0.4-0.3 % SOLN Place 1 drop into both eyes daily as needed (dry eyes).   pyridostigmine  (MESTINON ) 60 MG tablet Take 1 tablet (60 mg total) by mouth 5 (five) times daily.   tamsulosin  (FLOMAX ) 0.4 MG CAPS capsule Take 1 capsule (0.4 mg total) by mouth daily.            Past Medical History:  Diagnosis Date   Aortic insufficiency    Ascending aorta dilation (HCC)    Balance problem    CAD (coronary artery disease)    previous stent to LAD 2003   Diabetes mellitus without complication (HCC)    No longer taking diabetic medications at this time   GI bleeding    HFrEF (heart failure with reduced ejection fraction) (HCC)    History of complete heart block    History of kidney stones    Hyperlipidemia    Hypertension    Iron deficiency anemia    LBBB (left bundle branch block)    Melanoma (  HCC)    Myasthenia gravis (HCC)    eyelids, finger, and toes   NSVT (nonsustained ventricular tachycardia) (HCC)    OSA (obstructive sleep apnea)    Does not wear CPAP.   Presence of permanent cardiac pacemaker    S/P cardiac pacemaker procedure, placement 10/24/14 Medtronic Adapta L model ADDRL 1  10/25/2014   Spinal stenosis    Severe   Tricuspid regurgitation       Objective:    Wts  05/25/2024      189  03/31/2024         195   01/05/2024       186    08/20/23 185 lb 9.6 oz (84.2 kg)  05/06/23 173 lb (78.5 kg)  02/11/23 164 lb 8 oz (74.6 kg)     Vital signs reviewed  05/25/2024  - Note at rest 02 sats  96% on RA   General appearance:  pleasant   amb  wm / 2 wheeled walker     HEENT : Oropharynx  clear      Nasal turbinates nl    NECK :  without  apparent JVD/ palpable Nodes/TM    LUNGS: no acc muscle use,  Nl contour chest which is clear to A and P bilaterally without cough on insp or exp maneuvers   CV:  RRR  no s3 or murmur or increase in P2, and no edema   ABD:  soft and nontender   MS:   ext warm without deformities Or obvious joint restrictions  calf tenderness, cyanosis or clubbing    SKIN: warm and dry without lesions    NEURO:  alert, approp, nl sensorium with  no motor or cerebellar deficits apparent.     I personally reviewed images and agree with radiology impression as follows:  CXR:   pa and lateal 05/04/24 1. Cardiomegaly with increased densities in both lungs. Findings are concerning for pulmonary edema. 2. Possible small right pleural effusion.     Assessment   Assessment & Plan DOE (dyspnea on exertion) Doe/ cough x April 2024  - echo 09/03/22  EF 40-35% mod AI  - 05/06/2023   Walked on RA  x  1  lap(s) =  approx 150  ft  @ nl  pace, stopped due to unsteady on feet  with lowest 02 sats 94%  - 08/20/2023   Walked on RA  x  one  lap(s) =  approx 250  ft  @ slow/cane pace, stopped due to fatigue  with lowest 02 sats 95%   - 01/05/2024   Walked on RA  x  3  lap(s) =  approx 750  ft  @ slow pace, stopped due to end of study with lowest 02 sats 95% and no sob   - 03/31/2024   Walked on RA  x  1  lap(s) =  approx 250  ft  @ slow pace/unsteady gait, held the wall while he walked/refused to use a walker/no SOB  pace, stopped due to fatigue  with lowest 02 sats 91% but improved to 92% before stopped    - See admit 05/04/24 classic pulmonary edema pattern assoc with 10 lb wt gain over baseline around 183-5, leg swelling   No evidence of significant PF contibuting to his symptoms which rapidly responded to diuretics so no further f/u in pulmonary clinic needed         Each maintenance medication was reviewed in detail  including emphasizing most importantly the difference  between maintenance and prns and under what circumstances the prns are to be triggered using an action plan format where appropriate.  Total time for H and P, chart review, counseling, reviewing  admit details and ways to prevent this from happening again,  and generating customized AVS unique to this office visit / same day charting = 34 min post hosp f/u          AVS  Patient Instructions  Keep wt down to mid to low 80s and let Dr Clarice or Pietro know if trending up above this level  Keep track of your  If you are satisfied with your treatment plan,  let your doctor know and he/she can either refill your medications or you can return here when your prescription runs out.     If in any way you are not 100% satisfied,  please tell us .  If 100% better, tell your friends!  Pulmonary follow up is as needed     Ozell America, MD 05/25/2024

## 2024-05-25 NOTE — Patient Instructions (Signed)
 Keep wt down to mid to low 80s and let Dr Clarice or Pietro know if trending up above this level  Keep track of your  If you are satisfied with your treatment plan,  let your doctor know and he/she can either refill your medications or you can return here when your prescription runs out.     If in any way you are not 100% satisfied,  please tell us .  If 100% better, tell your friends!  Pulmonary follow up is as needed

## 2024-05-26 ENCOUNTER — Encounter: Payer: Self-pay | Admitting: Physician Assistant

## 2024-06-02 ENCOUNTER — Ambulatory Visit: Attending: Cardiology | Admitting: Cardiology

## 2024-06-02 ENCOUNTER — Encounter: Payer: Self-pay | Admitting: Cardiology

## 2024-06-02 VITALS — BP 130/78 | HR 75 | Ht 69.0 in | Wt 183.6 lb

## 2024-06-02 DIAGNOSIS — I480 Paroxysmal atrial fibrillation: Secondary | ICD-10-CM | POA: Diagnosis not present

## 2024-06-02 DIAGNOSIS — I5032 Chronic diastolic (congestive) heart failure: Secondary | ICD-10-CM | POA: Diagnosis not present

## 2024-06-02 DIAGNOSIS — Z950 Presence of cardiac pacemaker: Secondary | ICD-10-CM

## 2024-06-02 DIAGNOSIS — I10 Essential (primary) hypertension: Secondary | ICD-10-CM

## 2024-06-02 DIAGNOSIS — I25118 Atherosclerotic heart disease of native coronary artery with other forms of angina pectoris: Secondary | ICD-10-CM | POA: Diagnosis not present

## 2024-06-02 NOTE — Patient Instructions (Signed)

## 2024-06-03 ENCOUNTER — Ambulatory Visit: Payer: Self-pay | Admitting: Cardiology

## 2024-06-03 LAB — BASIC METABOLIC PANEL WITH GFR
BUN/Creatinine Ratio: 29 — ABNORMAL HIGH (ref 10–24)
BUN: 19 mg/dL (ref 8–27)
CO2: 26 mmol/L (ref 20–29)
Calcium: 8.7 mg/dL (ref 8.6–10.2)
Chloride: 102 mmol/L (ref 96–106)
Creatinine, Ser: 0.65 mg/dL — ABNORMAL LOW (ref 0.76–1.27)
Glucose: 82 mg/dL (ref 70–99)
Potassium: 4.4 mmol/L (ref 3.5–5.2)
Sodium: 138 mmol/L (ref 134–144)
eGFR: 92 mL/min/1.73 (ref >59)

## 2024-06-06 NOTE — Telephone Encounter (Signed)
 Letter of results sent to pt

## 2024-06-10 ENCOUNTER — Other Ambulatory Visit: Payer: Self-pay | Admitting: Physician Assistant

## 2024-06-10 DIAGNOSIS — I5022 Chronic systolic (congestive) heart failure: Secondary | ICD-10-CM

## 2024-06-16 ENCOUNTER — Ambulatory Visit: Admitting: Neurology

## 2024-06-16 ENCOUNTER — Encounter: Payer: Self-pay | Admitting: Neurology

## 2024-06-16 ENCOUNTER — Other Ambulatory Visit: Payer: Self-pay | Admitting: Internal Medicine

## 2024-06-16 DIAGNOSIS — R058 Other specified cough: Secondary | ICD-10-CM

## 2024-06-16 NOTE — Telephone Encounter (Signed)
 Pt requesting refill of Protonix  - not mentioned in LOV notes to continue or d/c. Please advise, thank you

## 2024-06-27 ENCOUNTER — Encounter: Payer: Self-pay | Admitting: Neurology

## 2024-06-27 ENCOUNTER — Ambulatory Visit: Admitting: Neurology

## 2024-06-27 VITALS — BP 110/60 | HR 80 | Resp 15 | Ht 69.0 in | Wt 185.0 lb

## 2024-06-27 DIAGNOSIS — R350 Frequency of micturition: Secondary | ICD-10-CM

## 2024-06-27 DIAGNOSIS — Z79899 Other long term (current) drug therapy: Secondary | ICD-10-CM | POA: Diagnosis not present

## 2024-06-27 DIAGNOSIS — G4733 Obstructive sleep apnea (adult) (pediatric): Secondary | ICD-10-CM

## 2024-06-27 DIAGNOSIS — G7000 Myasthenia gravis without (acute) exacerbation: Secondary | ICD-10-CM

## 2024-06-27 DIAGNOSIS — R269 Unspecified abnormalities of gait and mobility: Secondary | ICD-10-CM | POA: Diagnosis not present

## 2024-06-27 NOTE — Progress Notes (Signed)
 [facility]  PATIENT: David [name] David [name]. DOB: [birth year]  REFERRING DOCTOR OR PCP:  Ryan Hives (PCP) and Oneil Platts (Ophtho) SOURCE: Patient, records from Dr. Hives, Lab results, CPAP download  _________________________________   HISTORICAL  CHIEF COMPLAINT:  Chief Complaint  Patient presents with   Migraine    Rm 10, wife present, Migraine follow up    HISTORY OF PRESENT ILLNESS:  David [name]  is a 88 y.o. man with myasthenia gravis, and OSA.     Update 06/27/2024  He was hospitalized in October 2025 for CHF symptoms after having shortness of breath while he was receiving a Prolia  injection.   He feels back to baseline.  Notes and labs from hospitalization were reviewed.  Besides CHF, he has CAD, A-fib (on Eliquis ), heart block status post PPM.  Gait is much better since he started to use a walker more.   He feels his MG is doing well.   He takes pyridostigmine  60 mg po qid (rarely 5) and mycophenolate  250 mg po bid.  CBC/D and LFT were fine Strength is stable  No denies diplopia.  He did have blepharoplasty which helped ptosis.  Sometimes blinks a lot in stronger light.    No serious infections since his last visit.   He has tolerated the medications for myasthenia gravis well.  He has no difficulty with dysphagia.   He has occasional diarrhea and takes Imodium 2-3 times a week  He enjoys traveling and went to [place]  a couple months ago.  In 2024, did a hot air balloon in [place] .   He is sleeping well .    The desmopressin  and tamsulosin  have helped his sleep by reducing the nocturia to just once most nights.   After losing weight,  no longer snores and stopped CPAP.  He is not interested in retesting a PSG.  The LBP is doing well- has not bothered him much in last 2 years.   He has severe spinal stenosis at L3-L4 and also has a pars defect with anterolisthesis at L5-S1. He did ESI in past.     MG History:   He had the onset of ptosis, while  traveling in [place] and [place], about 3 weeks ago. He first noted the ptosis while trying to watch a movie in the bus (screen was up high) and noting that the eyelids were covering his vision.  He did note the trip was stressful and he and his wife both had injuries (he sprained ankle and wife broke shoulder).   Lab work was performed and the acetylcholine binding antibodies were elevated at 1.85 (less than 0.24 normal). 57% were blocking antibodies and 25% or modulating antibodies.   Other lab work was noncontributory.     CT scan of the head showed age-related mild chronic microvascular ischemic change.      REVIEW OF SYSTEMS: Constitutional: No fevers, chills, sweats, or change in appetite Eyes: No visual changes, double vision, eye pain.   He notes some ptosis which reduces his visual fields superiorly Ear, nose and throat: No hearing loss, ear pain, nasal congestion, sore throat Cardiovascular: No chest pain, palpitations Respiratory:  No shortness of breath at rest or with exertion.   No wheezes.  He has OSA and is having trouble using CPAP. GastrointestinaI: No nausea, vomiting, diarrhea, abdominal pain, fecal incontinence Genitourinary:  No dysuria, urinary retention or frequency.  No nocturia. Musculoskeletal:  No neck pain, back pain Integumentary: No rashes. Neurological: as above Psychiatric:  No depression at this time.  No anxiety Endocrine: No palpitations, diaphoresis, change in appetite, change in weigh or increased thirst Hematologic/Lymphatic:  No anemia, purpura, petechiae. Allergic/Immunologic: No itchy/runny eyes, nasal congestion, recent allergic reactions, rashes  ALLERGIES: Allergies  Allergen Reactions   Azithromycin     Runny nose  Other Reaction(s): interaction w/ myasthenious gravis   Clindamycin     Other Reaction(s): interaction w/ myasthenious gravis   Clindamycin Hcl     Runny nose    Doxycycline Diarrhea   Doxycycline Hyclate     unknown  Other  Reaction(s): headaches   Levofloxacin     unknown  Other Reaction(s): interaction w/ myasthenious gravis   Magnesium     Myasthenia gravis (Mg can precipitate MG crisis)   Nasal Spray     Other Reaction(s): Unknown   Niacin     unknown  Other Reaction(s): Patient does not think he is allergic to this   Terbinafine Hcl     Other Reaction(s): severe flushing   Gris-Peg [Griseofulvin] Other (See Comments)    headaches   Terbinafine Other (See Comments)    headache  Other Reaction(s): headaches    HOME MEDICATIONS:  Current Outpatient Medications:    apixaban  (ELIQUIS ) 5 MG TABS tablet, Take 1 tablet (5 mg total) by mouth 2 (two) times daily., Disp: 60 tablet, Rfl: 6   carvedilol  (COREG ) 3.125 MG tablet, Take 1 tablet (3.125 mg total) by mouth 2 (two) times daily with a meal., Disp: 60 tablet, Rfl: 3   Cholecalciferol (VITAMIN D3) 50 MCG (2000 UT) CAPS, Take 1 capsule by mouth daily., Disp: , Rfl:    desmopressin  (DDAVP ) 0.1 MG tablet, TAKE 1 TABLET(0.1 MG TABLET) BY MOUTH AT BEDTIME. CALL [phone] TO SCHEDULE FOLLOW UP FOR ONGOING REFILLS., Disp: 90 tablet, Rfl: 1   empagliflozin  (JARDIANCE ) 10 MG TABS tablet, TAKE 1 TABLET BY MOUTH EVERY DAY, Disp: 90 tablet, Rfl: 3   ezetimibe -simvastatin  (VYTORIN ) 10-40 MG tablet, Take 1 tablet by mouth daily., Disp: , Rfl:    famotidine  (PEPCID ) 20 MG tablet, One after supper, Disp: 30 tablet, Rfl: 11   furosemide  (LASIX ) 20 MG tablet, Take 1 tablet (20 mg total) by mouth daily. Take an extra 20 mg for weight gain of 3 lbs in 1 day, 5 lbs in one week, worsening ankle swelling, or worsening shortness of breath. Let your provider know if you do so., Disp: 30 tablet, Rfl: 1   ipratropium (ATROVENT ) 0.06 % nasal spray, Up to 2 puffs every 6 hours as needed, Disp: 15 mL, Rfl: 12   loratadine  (CLARITIN ) 10 MG tablet, Take 10 mg by mouth daily., Disp: , Rfl:    losartan  (COZAAR ) 25 MG tablet, TAKE 1/2 TABLET(12.5 MG) BY MOUTH DAILY, Disp: 45 tablet,  Rfl: 1   mycophenolate  (CELLCEPT ) 250 MG capsule, Take 1 capsule (250 mg total) by mouth 2 (two) times daily., Disp: 180 capsule, Rfl: 3   nitroGLYCERIN  (NITROSTAT ) 0.4 MG SL tablet, Place 1 tablet (0.4 mg total) under the tongue every 5 (five) minutes as needed for chest pain., Disp: 25 tablet, Rfl: 5   pantoprazole  (PROTONIX ) 40 MG tablet, TAKE 1 TABLET(40 MG) BY MOUTH DAILY 30 TO 60 MINUTES BEFORE FIRST MEAL OF THE DAY, Disp: 90 tablet, Rfl: 1   Polyethyl Glycol-Propyl Glycol (SYSTANE) 0.4-0.3 % SOLN, Place 1 drop into both eyes daily as needed (dry eyes)., Disp: , Rfl:    pyridostigmine  (MESTINON ) 60 MG tablet, Take 1 tablet (60 mg total) by mouth  5 (five) times daily., Disp: 150 tablet, Rfl: 11   tamsulosin  (FLOMAX ) 0.4 MG CAPS capsule, Take 1 capsule (0.4 mg total) by mouth daily., Disp: 60 capsule, Rfl: 2  PAST MEDICAL HISTORY: Past Medical History:  Diagnosis Date   Aortic insufficiency    Ascending aorta dilation    Balance problem    CAD (coronary artery disease)    previous stent to LAD 2003   Diabetes mellitus without complication (HCC)    No longer taking diabetic medications at this time   GI bleeding    HFrEF (heart failure with reduced ejection fraction) (HCC)    History of complete heart block    History of kidney stones    Hyperlipidemia    Hypertension    Iron deficiency anemia    LBBB (left bundle branch block)    Melanoma (HCC)    Myasthenia gravis (HCC)    eyelids, finger, and toes   NSVT (nonsustained ventricular tachycardia) (HCC)    OSA (obstructive sleep apnea)    Does not wear CPAP.   Presence of permanent cardiac pacemaker    S/P cardiac pacemaker procedure, placement 10/24/14 Medtronic Adapta L model ADDRL 1  10/25/2014   Spinal stenosis    Severe   Tricuspid regurgitation     PAST SURGICAL HISTORY: Past Surgical History:  Procedure Laterality Date   COLONOSCOPY     CORONARY ANGIOPLASTY WITH STENT PLACEMENT  03/01/2002   stenting to LAD - Dr.  Bulah   CYSTOSCOPY/URETEROSCOPY/HOLMIUM LASER/STENT PLACEMENT Left 08/31/2019   Procedure: CYSTOSCOPY/RETROGRADE/STENT PLACEMENT;  Surgeon: Devere Lonni Righter, MD;  Location: [facility];  Service: Urology;  Laterality: Left;  ONY NEEDS 45 MIN   NM MYOCAR PERF WALL MOTION  11/27/2010   normal   PERMANENT PACEMAKER INSERTION N/A 10/24/2014   Procedure: PERMANENT PACEMAKER INSERTION;  Surgeon: Lynwood Rakers, MD;  Location: [facility] CATH LAB;  Service: Cardiovascular;  Laterality: N/A;   TONSILLECTOMY     TRANSURETHRAL RESECTION OF PROSTATE N/A 07/06/2019   Procedure: TRANSURETHRAL RESECTION OF THE PROSTATE (TURP), BIPOLAR;  Surgeon: Devere Lonni Righter, MD;  Location: [facility];  Service: Urology;  Laterality: N/A;   US  ECHOCARDIOGRAPHY  12/05/2010   proximal septal thickening,mild concentric LVH,mild deptal hypokinesis,RV mildly dilated,LA mildly dilated,mild AI,moderate aortic root dilatation,septal motion c/w conduction abnormality    FAMILY HISTORY: Family History  Problem Relation Age of Onset   Breast cancer Mother    Colon cancer Father    Lung cancer Father     SOCIAL HISTORY:  Social History   Socioeconomic History   Marital status: Married    Spouse name: [name]   Number of children: 4   Years of education: Not on file   Highest education level: High school graduate  Occupational History   Occupation: Retired  Tobacco Use   Smoking status: Former    Current packs/day: 0.00    Types: Cigarettes, Cigars    Start date: 04/28/1949    Quit date: 10/27/1982    Years since quitting: 41.6   Smokeless tobacco: Never  Vaping Use   Vaping status: Never Used  Substance and Sexual Activity   Alcohol  use: Not Currently   Drug use: No   Sexual activity: Not on file  Other Topics Concern   Not on file  Social History Narrative   Not on file   Social Drivers of Health   Financial Resource Strain: Low Risk  (05/06/2024)   Overall Financial  Resource Strain (CARDIA)  Difficulty of Paying Living Expenses: Not hard at all  Food Insecurity: No Food Insecurity (05/10/2024)   Hunger Vital Sign    Worried About Running Out of Food in the Last Year: Never true    Ran Out of Food in the Last Year: Never true  Transportation Needs: No Transportation Needs (05/10/2024)   PRAPARE - Administrator, Civil Service (Medical): No    Lack of Transportation (Non-Medical): No  Physical Activity: Not on file  Stress: Not on file  Social Connections: Unknown (05/06/2024)   Social Connection and Isolation Panel    Frequency of Communication with Friends and Family: Never    Frequency of Social Gatherings with Friends and Family: Never    Attends Religious Services: Never    Database Administrator or Organizations: No    Attends Banker Meetings: Never    Marital Status: Not on file  Intimate Partner Violence: Patient Unable To Answer (05/10/2024)   Humiliation, Afraid, Rape, and Kick questionnaire    Fear of Current or Ex-Partner: Patient unable to answer    Emotionally Abused: Patient unable to answer    Physically Abused: Patient unable to answer    Sexually Abused: Patient unable to answer     PHYSICAL EXAM  Vitals:   06/27/24 0957  BP: 110/60  Pulse: 80  Resp: 15  SpO2: 95%  Weight: 185 lb (83.9 kg)  Height: 5' 9 (1.753 m)     Body mass index is 27.32 kg/m.     General: The patient is well-developed and well-nourished and in no acute distress   Neurologic Exam  Mental status: The patient is alert and oriented x 3 at the time of the examination. The patient has apparent normal recent and remote memory, with an apparently normal attention span and concentration ability.   Speech is normal.  Cranial nerves: Extraocular muscles were intact and no diplopia or ptosis even with prolonged upgaze. He has slightly reduced neck extension strength.  The voice is strong.. Trapezius is strong.    No obvious  hearing deficits are noted.  Motor:  Muscle bulk is normal.   Tone is normal.Unable to stand up without using arms.   Strength is  5 / 5 in the arms shoulders and neck.  Strength is 4-/5 in the L5 innervated foot and ankle muscles on the left but 5/5 elsewhere in the left leg.  EHL is 4/5 right and 4+/5 left and he is 5/5 elsewhere on the right.     Sensory: He has reduced sensation in the left foot over the L5 dermatome.  Coordination: Cerebellar testing reveals good finger-nose-finger and mildly reduced left heel-to-shin .  Gait and station: Station is normal.  His stride is mildly off balance and mildly wide.SABRA  He can turn 180 degrees in 4  steps.Poor tandem gait.  Romberg is negative.  Reflexes: Deep tendon reflexes are symmetric and normal in the arms and knees but absent at the ankles    DIAGNOSTIC DATA (LABS, IMAGING, TESTING) - I reviewed patient records, labs, notes, testing and imaging myself where available.  Lab Results  Component Value Date   WBC 6.8 05/05/2024   HGB 13.4 05/05/2024   HCT 44.4 05/05/2024   MCV 97.8 05/05/2024   PLT 160 05/05/2024      Component Value Date/Time   NA 138 06/02/2024 1118   K 4.4 06/02/2024 1118   CL 102 06/02/2024 1118   CO2 26 06/02/2024 1118   GLUCOSE  82 06/02/2024 1118   GLUCOSE 122 (H) 05/18/2024 1129   BUN 19 06/02/2024 1118   CREATININE 0.65 (L) 06/02/2024 1118   CALCIUM  8.7 06/02/2024 1118   PROT 5.7 (L) 05/05/2024 0243   PROT 6.3 11/09/2023 1329   ALBUMIN 3.1 (L) 05/07/2024 0330   ALBUMIN 4.2 11/09/2023 1329   AST 20 05/05/2024 0243   ALT 12 05/05/2024 0243   ALKPHOS 49 05/05/2024 0243   BILITOT 2.9 (H) 05/05/2024 0243   BILITOT 1.3 (H) 11/09/2023 1329   GFRNONAA >60 05/18/2024 1129   GFRAA 105 11/17/2019 1419   Lab Results  Component Value Date   CHOL 136 12/24/2022   HDL 55 12/24/2022   LDLCALC 67 12/24/2022   TRIG 69 12/24/2022   CHOLHDL 2.5 12/24/2022   Lab Results  Component Value Date   HGBA1C 5.6  05/04/2024   Lab Results  Component Value Date   VITAMINB12 411 05/08/2021   Lab Results  Component Value Date   TSH 1.409 05/04/2024       ASSESSMENT AND PLAN    1. Myasthenia gravis (HCC)   2. High risk medication use   3. Gait abnormality   4. OSA (obstructive sleep apnea)   5. Urinary frequency      1.. Continue Mestinon  60 mg po qid to 5 x d and CellCept  250 mg po bid.   If MG flares up, consider IVIg or other agent (such as Vyvgart) and increase Cellcept  to 500 mg po bid.     2.  Continue desmopressin  for nocturia.     3.   If regains weight, might need to reconsider CPAP. 4.   He will return to see me in 6 months but call sooner if he has new or worsening neurologic symptoms.    This visit is part of a comprehensive longitudinal care medical relationship regarding the patients primary diagnosis of myasthenia gravis and related concerns.   Alaynah Schutter A. Vear, MD, PhD 06/27/2024, 10:43 AM Certified in Neurology, Clinical Neurophysiology, Sleep Medicine, Pain Medicine and Neuroimaging  [facility] [address] [place], [place] [address] [phone]

## 2024-08-05 ENCOUNTER — Other Ambulatory Visit: Payer: Self-pay | Admitting: Cardiology

## 2024-08-05 DIAGNOSIS — I4819 Other persistent atrial fibrillation: Secondary | ICD-10-CM

## 2024-08-13 ENCOUNTER — Other Ambulatory Visit: Payer: Self-pay | Admitting: Neurology

## 2024-08-17 ENCOUNTER — Ambulatory Visit: Attending: Internal Medicine

## 2024-08-17 DIAGNOSIS — I4819 Other persistent atrial fibrillation: Secondary | ICD-10-CM | POA: Diagnosis not present

## 2024-08-18 NOTE — Telephone Encounter (Signed)
 Pt reporting that the medication was not called into the pharmacy, he is being told by [facility] the Rx needs to be sent again please.

## 2024-08-19 ENCOUNTER — Other Ambulatory Visit: Payer: Self-pay | Admitting: Neurology

## 2024-08-19 ENCOUNTER — Other Ambulatory Visit (HOSPITAL_COMMUNITY): Payer: Self-pay

## 2024-08-19 DIAGNOSIS — I5032 Chronic diastolic (congestive) heart failure: Secondary | ICD-10-CM

## 2024-08-19 MED ORDER — CARVEDILOL 3.125 MG PO TABS: 3.1250 mg | ORAL_TABLET | Freq: Two times a day (BID) | ORAL | 3 refills | Status: AC

## 2024-08-22 LAB — CUP PACEART REMOTE DEVICE CHECK
Battery Impedance: 1972 Ohm
Battery Remaining Longevity: 31 mo
Battery Voltage: 2.74 V
Brady Statistic AP VP Percent: 21 %
Brady Statistic AP VS Percent: 0 %
Brady Statistic AS VP Percent: 23 %
Brady Statistic AS VS Percent: 56 %
Date Time Interrogation Session: 20260123125609
Implantable Lead Connection Status: 753985
Implantable Lead Connection Status: 753985
Implantable Lead Implant Date: 20160329
Implantable Lead Implant Date: 20160329
Implantable Lead Location: 753859
Implantable Lead Location: 753860
Implantable Lead Model: 5076
Implantable Lead Model: 5092
Implantable Pulse Generator Implant Date: 20160329
Lead Channel Impedance Value: 386 Ohm
Lead Channel Impedance Value: 554 Ohm
Lead Channel Pacing Threshold Amplitude: 0.5 V
Lead Channel Pacing Threshold Amplitude: 0.75 V
Lead Channel Pacing Threshold Pulse Width: 0.4 ms
Lead Channel Pacing Threshold Pulse Width: 0.4 ms
Lead Channel Setting Pacing Amplitude: 2 V
Lead Channel Setting Pacing Amplitude: 2.5 V
Lead Channel Setting Pacing Pulse Width: 0.4 ms
Lead Channel Setting Sensing Sensitivity: 2.8 mV
Zone Setting Status: 755011
Zone Setting Status: 755011

## 2024-08-22 NOTE — Progress Notes (Signed)
 Remote PPM Transmission

## 2024-08-23 ENCOUNTER — Ambulatory Visit: Payer: Self-pay | Admitting: Cardiology

## 2024-08-25 ENCOUNTER — Other Ambulatory Visit: Payer: Self-pay | Admitting: Neurology

## 2024-08-31 MED ORDER — DESMOPRESSIN ACETATE 0.1 MG PO TABS: 0.1000 mg | ORAL_TABLET | Freq: Every day | ORAL | 3 refills | Status: DC

## 2024-08-31 MED ORDER — DESMOPRESSIN ACETATE 0.1 MG PO TABS: 0.1000 mg | ORAL_TABLET | Freq: Every day | ORAL | 3 refills | Status: AC

## 2024-08-31 NOTE — Addendum Note (Signed)
 Addended by: ONEITA NEVELYN BRAVO on: 08/31/2024 03:55 PM   Modules accepted: Orders

## 2024-08-31 NOTE — Telephone Encounter (Signed)
 Pt wife called stating that  she will be coming up here to get a RX for Pt medication ,  and they have been waiting for Pharmacy to  to realease Pt medication desmopressin  (DDAVP ) 0.1 MG tablet . Pharmacy keep telling Pt to cal office . Pt wife stated she did get some medication for a trip  they were going on however Pt is out and Pt wife is demanding for a refill today . Informed Pt wife to not come to office I will send message  however Pt wife stated she will like to speak to nurse  about getting Pt medication refilled to day . FYI Pt  wife is on her way now

## 2024-08-31 NOTE — Telephone Encounter (Signed)
 Printed rx and had dr. Vear sign for them to take to pharmacy. I provided it to pt wife. She voiced gratitude and understanding

## 2024-11-16 ENCOUNTER — Ambulatory Visit

## 2025-02-15 ENCOUNTER — Ambulatory Visit: Admitting: Neurology

## 2025-02-15 ENCOUNTER — Ambulatory Visit

## 2025-05-17 ENCOUNTER — Ambulatory Visit

## 2025-08-16 ENCOUNTER — Ambulatory Visit
